# Patient Record
Sex: Female | Born: 1960 | Race: White | Hispanic: No | Marital: Married | Smoking: Never smoker
Health system: Southern US, Community
[De-identification: ages and names within clinical notes are randomized; demographics above are authoritative.]

## PROBLEM LIST (undated history)

## (undated) DIAGNOSIS — D500 Iron deficiency anemia secondary to blood loss (chronic): Secondary | ICD-10-CM

## (undated) DIAGNOSIS — R739 Hyperglycemia, unspecified: Secondary | ICD-10-CM

## (undated) DIAGNOSIS — L309 Dermatitis, unspecified: Secondary | ICD-10-CM

## (undated) DIAGNOSIS — R159 Full incontinence of feces: Secondary | ICD-10-CM

## (undated) DIAGNOSIS — Z79899 Other long term (current) drug therapy: Secondary | ICD-10-CM

## (undated) DIAGNOSIS — E781 Pure hyperglyceridemia: Secondary | ICD-10-CM

## (undated) DIAGNOSIS — N924 Excessive bleeding in the premenopausal period: Secondary | ICD-10-CM

## (undated) DIAGNOSIS — F259 Schizoaffective disorder, unspecified: Secondary | ICD-10-CM

## (undated) DIAGNOSIS — F250 Schizoaffective disorder, bipolar type: Secondary | ICD-10-CM

## (undated) DIAGNOSIS — K5909 Other constipation: Secondary | ICD-10-CM

## (undated) DIAGNOSIS — R438 Other disturbances of smell and taste: Secondary | ICD-10-CM

## (undated) HISTORY — PX: COLONOSCOPY: SHX174

## (undated) HISTORY — DX: Schizoaffective disorder, bipolar type: F25.0

## (undated) HISTORY — DX: Other long term (current) drug therapy: Z79.899

## (undated) HISTORY — DX: Iron deficiency anemia secondary to blood loss (chronic): D50.0

## (undated) HISTORY — DX: Hyperglycemia, unspecified: R73.9

## (undated) HISTORY — DX: Full incontinence of feces: R15.9

## (undated) HISTORY — DX: Excessive bleeding in the premenopausal period: N92.4

## (undated) HISTORY — DX: Pure hyperglyceridemia: E78.1

## (undated) HISTORY — DX: Other constipation: K59.09

## (undated) HISTORY — DX: Other disturbances of smell and taste: R43.8

## (undated) HISTORY — DX: Dermatitis, unspecified: L30.9

---

## 1994-12-02 HISTORY — PX: TUBAL LIGATION: SHX77

## 1998-12-02 HISTORY — PX: ANUS SURGERY: SHX302

## 2011-07-02 ENCOUNTER — Other Ambulatory Visit: Payer: Self-pay | Admitting: Psychiatry

## 2011-07-03 ENCOUNTER — Other Ambulatory Visit: Payer: Self-pay | Admitting: Psychiatry

## 2011-08-03 ENCOUNTER — Other Ambulatory Visit: Payer: Self-pay | Admitting: Psychiatry

## 2011-09-02 ENCOUNTER — Other Ambulatory Visit: Payer: Self-pay | Admitting: Psychiatry

## 2011-10-03 ENCOUNTER — Other Ambulatory Visit: Payer: Self-pay | Admitting: Psychiatry

## 2011-10-10 ENCOUNTER — Other Ambulatory Visit: Payer: Self-pay | Admitting: Family Medicine

## 2011-11-19 ENCOUNTER — Other Ambulatory Visit: Payer: Self-pay | Admitting: Psychiatry

## 2011-12-03 ENCOUNTER — Other Ambulatory Visit: Payer: Self-pay | Admitting: Psychiatry

## 2011-12-26 LAB — CBC WITH DIFFERENTIAL/PLATELET
Basophil #: 0 10*3/uL (ref 0.0–0.1)
Eosinophil #: 0.2 10*3/uL (ref 0.0–0.7)
Eosinophil %: 3 %
HCT: 35.9 % (ref 35.0–47.0)
HGB: 12.1 g/dL (ref 12.0–16.0)
Lymphocyte #: 2.5 10*3/uL (ref 1.0–3.6)
MCHC: 33.7 g/dL (ref 32.0–36.0)
Monocyte #: 0.4 10*3/uL (ref 0.0–0.7)
Monocyte %: 6.3 %
Neutrophil %: 52.7 %
Platelet: 249 10*3/uL (ref 150–440)
RDW: 13.2 % (ref 11.5–14.5)
WBC: 6.8 10*3/uL (ref 3.6–11.0)

## 2012-01-03 ENCOUNTER — Other Ambulatory Visit: Payer: Self-pay | Admitting: Psychiatry

## 2012-01-21 LAB — CBC WITH DIFFERENTIAL/PLATELET
Basophil #: 0 10*3/uL (ref 0.0–0.1)
Basophil %: 0.5 %
Eosinophil #: 0.1 10*3/uL (ref 0.0–0.7)
Eosinophil %: 2.1 %
Lymphocyte #: 1.9 10*3/uL (ref 1.0–3.6)
MCH: 30.1 pg (ref 26.0–34.0)
MCHC: 33 g/dL (ref 32.0–36.0)
MCV: 91 fL (ref 80–100)
Monocyte #: 0.3 10*3/uL (ref 0.0–0.7)
Neutrophil #: 3.3 10*3/uL (ref 1.4–6.5)
Platelet: 242 10*3/uL (ref 150–440)
RDW: 13.3 % (ref 11.5–14.5)

## 2012-01-31 ENCOUNTER — Other Ambulatory Visit: Payer: Self-pay | Admitting: Psychiatry

## 2012-02-18 LAB — CBC WITH DIFFERENTIAL/PLATELET
Basophil #: 0 10*3/uL (ref 0.0–0.1)
Eosinophil #: 0.2 10*3/uL (ref 0.0–0.7)
HCT: 36 % (ref 35.0–47.0)
HGB: 12.2 g/dL (ref 12.0–16.0)
Lymphocyte #: 2.2 10*3/uL (ref 1.0–3.6)
Lymphocyte %: 35 %
MCH: 30.8 pg (ref 26.0–34.0)
MCHC: 33.8 g/dL (ref 32.0–36.0)
MCV: 91 fL (ref 80–100)
Monocyte #: 0.4 10*3/uL (ref 0.0–0.7)
Monocyte %: 5.7 %
Neutrophil #: 3.5 10*3/uL (ref 1.4–6.5)
RDW: 13.5 % (ref 11.5–14.5)
WBC: 6.2 10*3/uL (ref 3.6–11.0)

## 2012-03-02 ENCOUNTER — Other Ambulatory Visit: Payer: Self-pay | Admitting: Psychiatry

## 2012-03-19 LAB — CBC WITH DIFFERENTIAL/PLATELET
Basophil #: 0.1 10*3/uL (ref 0.0–0.1)
Eosinophil %: 1.7 %
Lymphocyte #: 2.1 10*3/uL (ref 1.0–3.6)
Lymphocyte %: 30.9 %
MCHC: 32.3 g/dL (ref 32.0–36.0)
MCV: 92 fL (ref 80–100)
Monocyte %: 6.2 %
Neutrophil #: 4 10*3/uL (ref 1.4–6.5)
Neutrophil %: 60.4 %
Platelet: 246 10*3/uL (ref 150–440)
WBC: 6.7 10*3/uL (ref 3.6–11.0)

## 2012-04-01 ENCOUNTER — Other Ambulatory Visit: Payer: Self-pay | Admitting: Psychiatry

## 2012-04-28 LAB — CBC WITH DIFFERENTIAL/PLATELET
Basophil #: 0 10*3/uL (ref 0.0–0.1)
Basophil %: 0.5 %
Eosinophil #: 0.4 10*3/uL (ref 0.0–0.7)
Eosinophil %: 5.8 %
HCT: 37.3 % (ref 35.0–47.0)
HGB: 12.3 g/dL (ref 12.0–16.0)
Lymphocyte #: 2.5 10*3/uL (ref 1.0–3.6)
MCH: 30.9 pg (ref 26.0–34.0)
MCHC: 33.1 g/dL (ref 32.0–36.0)
Monocyte #: 0.4 x10 3/mm (ref 0.2–0.9)
Monocyte %: 5.1 %
Neutrophil #: 4.4 10*3/uL (ref 1.4–6.5)
Neutrophil %: 56.7 %
RBC: 4 10*6/uL (ref 3.80–5.20)

## 2012-05-02 ENCOUNTER — Other Ambulatory Visit: Payer: Self-pay | Admitting: Psychiatry

## 2012-05-26 LAB — CBC WITH DIFFERENTIAL/PLATELET
Basophil #: 0.1 10*3/uL (ref 0.0–0.1)
Basophil %: 0.7 %
Eosinophil #: 0.2 10*3/uL (ref 0.0–0.7)
Eosinophil %: 2.7 %
HCT: 38.5 % (ref 35.0–47.0)
Lymphocyte #: 2.2 10*3/uL (ref 1.0–3.6)
MCHC: 33.1 g/dL (ref 32.0–36.0)
MCV: 92 fL (ref 80–100)
Neutrophil #: 4.2 10*3/uL (ref 1.4–6.5)
Neutrophil %: 60.4 %
Platelet: 235 10*3/uL (ref 150–440)
RBC: 4.19 10*6/uL (ref 3.80–5.20)
WBC: 7 10*3/uL (ref 3.6–11.0)

## 2012-06-01 ENCOUNTER — Other Ambulatory Visit: Payer: Self-pay | Admitting: Psychiatry

## 2012-06-23 LAB — CBC WITH DIFFERENTIAL/PLATELET
Basophil #: 0 10*3/uL (ref 0.0–0.1)
Basophil %: 0.6 %
Eosinophil #: 0.1 10*3/uL (ref 0.0–0.7)
Eosinophil %: 1.8 %
HCT: 37.8 % (ref 35.0–47.0)
Lymphocyte #: 2.2 10*3/uL (ref 1.0–3.6)
Lymphocyte %: 30.9 %
MCH: 30.5 pg (ref 26.0–34.0)
MCHC: 32.9 g/dL (ref 32.0–36.0)
MCV: 93 fL (ref 80–100)
Monocyte #: 0.4 x10 3/mm (ref 0.2–0.9)
Neutrophil #: 4.4 10*3/uL (ref 1.4–6.5)
Neutrophil %: 61.1 %
RBC: 4.07 10*6/uL (ref 3.80–5.20)
RDW: 12.7 % (ref 11.5–14.5)
WBC: 7.2 10*3/uL (ref 3.6–11.0)

## 2012-07-02 ENCOUNTER — Other Ambulatory Visit: Payer: Self-pay | Admitting: Psychiatry

## 2012-07-16 LAB — CBC WITH DIFFERENTIAL/PLATELET
Basophil #: 0 10*3/uL (ref 0.0–0.1)
Basophil %: 0.6 %
Eosinophil #: 0.2 10*3/uL (ref 0.0–0.7)
Eosinophil %: 2.9 %
HCT: 36.5 % (ref 35.0–47.0)
Lymphocyte #: 1.8 10*3/uL (ref 1.0–3.6)
MCH: 30 pg (ref 26.0–34.0)
MCV: 92 fL (ref 80–100)
Monocyte %: 6.6 %
Neutrophil #: 3.9 10*3/uL (ref 1.4–6.5)
RBC: 4 10*6/uL (ref 3.80–5.20)
RDW: 12.9 % (ref 11.5–14.5)
WBC: 6.3 10*3/uL (ref 3.6–11.0)

## 2012-08-02 ENCOUNTER — Other Ambulatory Visit: Payer: Self-pay | Admitting: Psychiatry

## 2012-08-19 ENCOUNTER — Other Ambulatory Visit: Payer: Self-pay | Admitting: Psychiatry

## 2012-08-19 LAB — CBC WITH DIFFERENTIAL/PLATELET
Basophil %: 0.9 %
Eosinophil %: 2.2 %
HCT: 36.5 % (ref 35.0–47.0)
HGB: 12.4 g/dL (ref 12.0–16.0)
Lymphocyte #: 2.6 10*3/uL (ref 1.0–3.6)
MCH: 31.4 pg (ref 26.0–34.0)
MCV: 92 fL (ref 80–100)
Monocyte #: 0.5 x10 3/mm (ref 0.2–0.9)
Monocyte %: 6.6 %
Neutrophil %: 57 %
Platelet: 248 10*3/uL (ref 150–440)
RBC: 3.96 10*6/uL (ref 3.80–5.20)
WBC: 7.8 10*3/uL (ref 3.6–11.0)

## 2012-09-01 ENCOUNTER — Other Ambulatory Visit: Payer: Self-pay | Admitting: Psychiatry

## 2012-09-16 LAB — CBC WITH DIFFERENTIAL/PLATELET
Basophil %: 0.6 %
Eosinophil %: 1.9 %
HCT: 38.8 % (ref 35.0–47.0)
HGB: 12.9 g/dL (ref 12.0–16.0)
MCHC: 33.2 g/dL (ref 32.0–36.0)
MCV: 92 fL (ref 80–100)
Monocyte %: 5.1 %
Neutrophil %: 66.9 %
Platelet: 244 10*3/uL (ref 150–440)
RBC: 4.21 10*6/uL (ref 3.80–5.20)

## 2012-10-02 ENCOUNTER — Other Ambulatory Visit: Payer: Self-pay | Admitting: Psychiatry

## 2012-10-21 LAB — CBC WITH DIFFERENTIAL/PLATELET
Basophil #: 0 10*3/uL (ref 0.0–0.1)
Basophil %: 0.4 %
Eosinophil %: 1.9 %
HCT: 37.8 % (ref 35.0–47.0)
HGB: 12.6 g/dL (ref 12.0–16.0)
Lymphocyte #: 2.2 10*3/uL (ref 1.0–3.6)
MCH: 30.4 pg (ref 26.0–34.0)
MCV: 92 fL (ref 80–100)
Monocyte #: 0.5 x10 3/mm (ref 0.2–0.9)
Monocyte %: 6.1 %
Neutrophil #: 4.6 10*3/uL (ref 1.4–6.5)
Platelet: 237 10*3/uL (ref 150–440)
RBC: 4.13 10*6/uL (ref 3.80–5.20)

## 2012-11-01 ENCOUNTER — Other Ambulatory Visit: Payer: Self-pay | Admitting: Psychiatry

## 2012-11-17 LAB — CBC WITH DIFFERENTIAL/PLATELET
Basophil %: 1.2 %
Eosinophil #: 0.1 10*3/uL (ref 0.0–0.7)
HCT: 37.4 % (ref 35.0–47.0)
HGB: 12.4 g/dL (ref 12.0–16.0)
Lymphocyte #: 2.1 10*3/uL (ref 1.0–3.6)
Lymphocyte %: 30.4 %
MCH: 30.2 pg (ref 26.0–34.0)
MCHC: 33.1 g/dL (ref 32.0–36.0)
MCV: 91 fL (ref 80–100)
Monocyte #: 0.4 x10 3/mm (ref 0.2–0.9)
Neutrophil #: 4.1 10*3/uL (ref 1.4–6.5)
Platelet: 231 10*3/uL (ref 150–440)
RDW: 12.7 % (ref 11.5–14.5)
WBC: 6.8 10*3/uL (ref 3.6–11.0)

## 2012-12-02 ENCOUNTER — Other Ambulatory Visit: Payer: Self-pay | Admitting: Psychiatry

## 2012-12-16 LAB — CBC WITH DIFFERENTIAL/PLATELET
Basophil #: 0 10*3/uL (ref 0.0–0.1)
Basophil %: 0.4 %
Eosinophil #: 0.1 10*3/uL (ref 0.0–0.7)
HGB: 12.6 g/dL (ref 12.0–16.0)
Lymphocyte #: 1.5 10*3/uL (ref 1.0–3.6)
MCH: 30.3 pg (ref 26.0–34.0)
MCV: 91 fL (ref 80–100)
Neutrophil #: 5.1 10*3/uL (ref 1.4–6.5)
Platelet: 247 10*3/uL (ref 150–440)
RBC: 4.16 10*6/uL (ref 3.80–5.20)
RDW: 12.9 % (ref 11.5–14.5)

## 2013-01-02 ENCOUNTER — Other Ambulatory Visit: Payer: Self-pay | Admitting: Psychiatry

## 2013-01-18 LAB — CBC WITH DIFFERENTIAL/PLATELET
Basophil #: 0.1 x10 3/mm 3
Basophil %: 1 %
Eosinophil #: 0.1 x10 3/mm 3
Eosinophil %: 1.2 %
HCT: 38.3 %
HGB: 12.9 g/dL
Lymphocyte %: 21.4 %
Lymphs Abs: 2 x10 3/mm 3
MCH: 30.6 pg
MCHC: 33.7 g/dL
MCV: 91 fL
Monocyte #: 0.4 "x10 3/mm "
Monocyte %: 4.8 %
Neutrophil #: 6.6 x10 3/mm 3 — ABNORMAL HIGH
Neutrophil %: 71.6 %
Platelet: 242 x10 3/mm 3
RBC: 4.22 X10 6/mm 3
RDW: 12.9 %
WBC: 9.2 x10 3/mm 3

## 2013-01-30 ENCOUNTER — Other Ambulatory Visit: Payer: Self-pay | Admitting: Psychiatry

## 2013-02-11 LAB — CBC WITH DIFFERENTIAL/PLATELET
Eosinophil %: 0.8 %
HCT: 34.1 % — ABNORMAL LOW (ref 35.0–47.0)
HGB: 11.5 g/dL — ABNORMAL LOW (ref 12.0–16.0)
Lymphocyte #: 1.7 10*3/uL (ref 1.0–3.6)
Lymphocyte %: 18.1 %
MCH: 30.5 pg (ref 26.0–34.0)
Monocyte #: 0.4 x10 3/mm (ref 0.2–0.9)
Neutrophil #: 7.3 10*3/uL — ABNORMAL HIGH (ref 1.4–6.5)
Platelet: 397 10*3/uL (ref 150–440)
RBC: 3.77 10*6/uL — ABNORMAL LOW (ref 3.80–5.20)
RDW: 12.9 % (ref 11.5–14.5)
WBC: 9.6 10*3/uL (ref 3.6–11.0)

## 2013-03-02 ENCOUNTER — Other Ambulatory Visit: Payer: Self-pay | Admitting: Psychiatry

## 2013-03-04 ENCOUNTER — Inpatient Hospital Stay: Payer: Self-pay | Admitting: Psychiatry

## 2013-03-04 LAB — URINALYSIS, COMPLETE
Ketone: NEGATIVE
Nitrite: NEGATIVE
Ph: 6 (ref 4.5–8.0)
Protein: NEGATIVE
Specific Gravity: 1.013 (ref 1.003–1.030)
WBC UR: 15 /HPF (ref 0–5)

## 2013-03-04 LAB — DRUG SCREEN, URINE
Benzodiazepine, Ur Scrn: NEGATIVE (ref ?–200)
Cannabinoid 50 Ng, Ur ~~LOC~~: NEGATIVE (ref ?–50)
MDMA (Ecstasy)Ur Screen: NEGATIVE (ref ?–500)
Phencyclidine (PCP) Ur S: NEGATIVE (ref ?–25)
Tricyclic, Ur Screen: NEGATIVE (ref ?–1000)

## 2013-03-04 LAB — CBC
HCT: 38.7 % (ref 35.0–47.0)
HGB: 12.9 g/dL (ref 12.0–16.0)
MCHC: 33.3 g/dL (ref 32.0–36.0)
MCV: 92 fL (ref 80–100)
RDW: 13.9 % (ref 11.5–14.5)
WBC: 12.7 10*3/uL — ABNORMAL HIGH (ref 3.6–11.0)

## 2013-03-04 LAB — COMPREHENSIVE METABOLIC PANEL
BUN: 11 mg/dL (ref 7–18)
Bilirubin,Total: 0.2 mg/dL (ref 0.2–1.0)
Calcium, Total: 8.8 mg/dL (ref 8.5–10.1)
Chloride: 107 mmol/L (ref 98–107)
Co2: 26 mmol/L (ref 21–32)
Creatinine: 0.75 mg/dL (ref 0.60–1.30)
EGFR (Non-African Amer.): >60
Glucose: 105 mg/dL — ABNORMAL HIGH (ref 65–99)
Osmolality: 277 (ref 275–301)
Potassium: 4.2 mmol/L (ref 3.5–5.1)
Sodium: 139 mmol/L (ref 136–145)

## 2013-03-04 LAB — ETHANOL: Ethanol: <3 mg/dL

## 2013-03-05 LAB — BEHAVIORAL MEDICINE 1 PANEL
Albumin: 3.7 g/dL (ref 3.4–5.0)
Alkaline Phosphatase: 114 U/L (ref 50–136)
Anion Gap: 7 (ref 7–16)
BUN: 10 mg/dL (ref 7–18)
Basophil #: 0 10*3/uL (ref 0.0–0.1)
Basophil %: 0.2 %
Bilirubin,Total: 0.8 mg/dL (ref 0.2–1.0)
Calcium, Total: 8.8 mg/dL (ref 8.5–10.1)
Chloride: 107 mmol/L (ref 98–107)
Co2: 25 mmol/L (ref 21–32)
Creatinine: 0.67 mg/dL (ref 0.60–1.30)
EGFR (African American): >60
EGFR (Non-African Amer.): >60
Eosinophil #: 0.1 10*3/uL (ref 0.0–0.7)
Eosinophil %: 0.7 %
Glucose: 105 mg/dL — ABNORMAL HIGH (ref 65–99)
HCT: 37.8 % (ref 35.0–47.0)
HGB: 12.4 g/dL (ref 12.0–16.0)
Lymphocyte #: 1.9 10*3/uL (ref 1.0–3.6)
Lymphocyte %: 15.6 %
MCH: 30.1 pg (ref 26.0–34.0)
MCHC: 32.9 g/dL (ref 32.0–36.0)
MCV: 92 fL (ref 80–100)
Monocyte #: 0.6 x10 3/mm (ref 0.2–0.9)
Monocyte %: 5 %
Neutrophil #: 9.5 10*3/uL — ABNORMAL HIGH (ref 1.4–6.5)
Neutrophil %: 78.5 %
Osmolality: 277 (ref 275–301)
Platelet: 290 10*3/uL (ref 150–440)
Potassium: 4 mmol/L (ref 3.5–5.1)
RBC: 4.13 10*6/uL (ref 3.80–5.20)
RDW: 13.4 % (ref 11.5–14.5)
SGOT(AST): 17 U/L (ref 15–37)
SGPT (ALT): 20 U/L (ref 12–78)
Sodium: 139 mmol/L (ref 136–145)
Thyroid Stimulating Horm: 2.43 u[IU]/mL
Total Protein: 7.4 g/dL (ref 6.4–8.2)
WBC: 12.1 10*3/uL — ABNORMAL HIGH (ref 3.6–11.0)

## 2013-03-07 LAB — URINE CULTURE

## 2013-03-12 LAB — DIFFERENTIAL
Basophil #: 0 10*3/uL (ref 0.0–0.1)
Basophil %: 0.3 %
Eosinophil #: 0.3 10*3/uL (ref 0.0–0.7)
Eosinophil %: 3.9 %
Lymphocyte #: 2.2 10*3/uL (ref 1.0–3.6)
Lymphocyte %: 29.8 %
Monocyte #: 0.6 x10 3/mm (ref 0.2–0.9)
Monocyte %: 7.6 %
Neutrophil #: 4.3 10*3/uL (ref 1.4–6.5)
Neutrophil %: 58.4 %

## 2013-03-12 LAB — WBC: WBC: 7.4 10*3/uL (ref 3.6–11.0)

## 2013-03-16 LAB — VALPROIC ACID LEVEL: Valproic Acid: 77 ug/mL

## 2013-03-19 LAB — DIFFERENTIAL
Basophil #: 0 10*3/uL (ref 0.0–0.1)
Eosinophil #: 0.3 10*3/uL (ref 0.0–0.7)
Lymphocyte #: 2 10*3/uL (ref 1.0–3.6)
Neutrophil #: 6.8 10*3/uL — ABNORMAL HIGH (ref 1.4–6.5)
Neutrophil %: 69.6 %

## 2013-03-26 LAB — DIFFERENTIAL
Basophil #: 0.1 10*3/uL (ref 0.0–0.1)
Eosinophil #: 0.3 10*3/uL (ref 0.0–0.7)
Eosinophil %: 3.1 %
Lymphocyte #: 2.7 10*3/uL (ref 1.0–3.6)
Lymphocyte %: 31.8 %
Monocyte #: 0.7 x10 3/mm (ref 0.2–0.9)
Monocyte %: 8.5 %

## 2013-03-26 LAB — WBC: WBC: 8.7 10*3/uL (ref 3.6–11.0)

## 2013-03-31 ENCOUNTER — Other Ambulatory Visit: Payer: Self-pay | Admitting: Psychiatry

## 2013-03-31 LAB — CBC WITH DIFFERENTIAL/PLATELET
Basophil #: 0.1 10*3/uL (ref 0.0–0.1)
Eosinophil #: 0.1 10*3/uL (ref 0.0–0.7)
Lymphocyte %: 19.8 %
MCH: 29.5 pg (ref 26.0–34.0)
MCHC: 32.5 g/dL (ref 32.0–36.0)
MCV: 91 fL (ref 80–100)
Monocyte #: 0.5 x10 3/mm (ref 0.2–0.9)
Neutrophil #: 6.4 10*3/uL (ref 1.4–6.5)
RBC: 3.83 10*6/uL (ref 3.80–5.20)
WBC: 8.9 10*3/uL (ref 3.6–11.0)

## 2013-03-31 LAB — HEPATIC FUNCTION PANEL A (ARMC)
Bilirubin, Direct: <0.1 mg/dL (ref 0.00–0.20)
Bilirubin,Total: 0.2 mg/dL (ref 0.2–1.0)
SGPT (ALT): 19 U/L (ref 12–78)
Total Protein: 6.4 g/dL (ref 6.4–8.2)

## 2013-05-01 ENCOUNTER — Other Ambulatory Visit: Payer: Self-pay | Admitting: Psychiatry

## 2013-05-01 LAB — CBC WITH DIFFERENTIAL/PLATELET
Basophil #: 0 10*3/uL (ref 0.0–0.1)
Basophil %: 0.5 %
HCT: 38.3 % (ref 35.0–47.0)
Lymphocyte #: 1.8 10*3/uL (ref 1.0–3.6)
Lymphocyte %: 26.5 %
MCH: 30.4 pg (ref 26.0–34.0)
MCHC: 34.3 g/dL (ref 32.0–36.0)
Monocyte #: 0.6 x10 3/mm (ref 0.2–0.9)
Neutrophil #: 4.1 10*3/uL (ref 1.4–6.5)
Neutrophil %: 61.1 %
Platelet: 224 10*3/uL (ref 150–440)
RBC: 4.32 10*6/uL (ref 3.80–5.20)
WBC: 6.8 10*3/uL (ref 3.6–11.0)

## 2013-05-02 ENCOUNTER — Other Ambulatory Visit: Payer: Self-pay | Admitting: Psychiatry

## 2013-05-05 ENCOUNTER — Ambulatory Visit: Payer: Self-pay | Admitting: Gastroenterology

## 2013-05-25 LAB — CBC WITH DIFFERENTIAL/PLATELET
Eosinophil #: 0.2 10*3/uL (ref 0.0–0.7)
Eosinophil %: 3.2 %
HGB: 12.4 g/dL (ref 12.0–16.0)
Lymphocyte %: 31.5 %
MCH: 30.5 pg (ref 26.0–34.0)
MCHC: 34.4 g/dL (ref 32.0–36.0)
MCV: 89 fL (ref 80–100)
Monocyte %: 7.6 %
Platelet: 228 10*3/uL (ref 150–440)
RBC: 4.06 10*6/uL (ref 3.80–5.20)
WBC: 6.2 10*3/uL (ref 3.6–11.0)

## 2013-06-01 ENCOUNTER — Other Ambulatory Visit: Payer: Self-pay | Admitting: Psychiatry

## 2013-07-01 LAB — CBC WITH DIFFERENTIAL/PLATELET
Basophil #: 0 10*3/uL (ref 0.0–0.1)
Basophil %: 0.5 %
Eosinophil #: 0.2 10*3/uL (ref 0.0–0.7)
Eosinophil %: 2.7 %
HCT: 36.3 % (ref 35.0–47.0)
HGB: 12.3 g/dL (ref 12.0–16.0)
Lymphocyte %: 29.8 %
MCH: 30.6 pg (ref 26.0–34.0)
MCHC: 33.9 g/dL (ref 32.0–36.0)
Monocyte #: 0.6 x10 3/mm (ref 0.2–0.9)
Monocyte %: 6.8 %
Neutrophil %: 60.2 %
Platelet: 219 10*3/uL (ref 150–440)

## 2013-07-02 ENCOUNTER — Other Ambulatory Visit: Payer: Self-pay | Admitting: Psychiatry

## 2013-07-27 LAB — CBC WITH DIFFERENTIAL/PLATELET
Basophil #: 0.1 10*3/uL (ref 0.0–0.1)
Basophil %: 0.9 %
Eosinophil %: 4.1 %
HCT: 35.5 % (ref 35.0–47.0)
Lymphocyte #: 2.6 10*3/uL (ref 1.0–3.6)
Lymphocyte %: 37.3 %
MCH: 30.6 pg (ref 26.0–34.0)
Monocyte %: 7.1 %
Neutrophil %: 50.6 %
RDW: 13.5 % (ref 11.5–14.5)
WBC: 7 10*3/uL (ref 3.6–11.0)

## 2013-08-02 ENCOUNTER — Other Ambulatory Visit: Payer: Self-pay | Admitting: Psychiatry

## 2013-08-05 LAB — HM COLONOSCOPY: HM Colonoscopy: NORMAL

## 2013-08-27 ENCOUNTER — Other Ambulatory Visit: Payer: Self-pay | Admitting: Family Medicine

## 2013-08-27 LAB — COMPREHENSIVE METABOLIC PANEL
Alkaline Phosphatase: 107 U/L (ref 50–136)
Anion Gap: 3 — ABNORMAL LOW (ref 7–16)
BUN: 11 mg/dL (ref 7–18)
Bilirubin,Total: 0.3 mg/dL (ref 0.2–1.0)
Calcium, Total: 8.5 mg/dL (ref 8.5–10.1)
Chloride: 108 mmol/L — ABNORMAL HIGH (ref 98–107)
Co2: 28 mmol/L (ref 21–32)
Creatinine: 0.95 mg/dL (ref 0.60–1.30)
EGFR (African American): >60
EGFR (Non-African Amer.): >60
Glucose: 98 mg/dL (ref 65–99)
Osmolality: 277 (ref 275–301)
Potassium: 4.2 mmol/L (ref 3.5–5.1)
SGOT(AST): 20 U/L (ref 15–37)
SGPT (ALT): 19 U/L (ref 12–78)

## 2013-08-27 LAB — CBC WITH DIFFERENTIAL/PLATELET
Basophil #: 0.1 10*3/uL (ref 0.0–0.1)
Eosinophil #: 0.2 10*3/uL (ref 0.0–0.7)
Eosinophil %: 4.3 %
HCT: 37.5 % (ref 35.0–47.0)
Lymphocyte %: 35.6 %
Monocyte %: 6.1 %
Neutrophil #: 2.8 10*3/uL (ref 1.4–6.5)
Platelet: 257 10*3/uL (ref 150–440)

## 2013-08-27 LAB — LIPID PANEL
Cholesterol: 241 mg/dL — ABNORMAL HIGH (ref 0–200)
Ldl Cholesterol, Calc: 149 mg/dL — ABNORMAL HIGH (ref 0–100)
Triglycerides: 143 mg/dL (ref 0–200)

## 2013-08-30 ENCOUNTER — Ambulatory Visit: Payer: Self-pay | Admitting: Family Medicine

## 2013-08-30 LAB — HM MAMMOGRAPHY: HM MAMMO: NORMAL

## 2013-10-01 ENCOUNTER — Other Ambulatory Visit: Payer: Self-pay | Admitting: Psychiatry

## 2013-10-01 LAB — CBC WITH DIFFERENTIAL/PLATELET
Basophil #: 0.1 10*3/uL (ref 0.0–0.1)
Eosinophil #: 0.1 10*3/uL (ref 0.0–0.7)
HCT: 36.9 % (ref 35.0–47.0)
HGB: 12.6 g/dL (ref 12.0–16.0)
Lymphocyte #: 2 10*3/uL (ref 1.0–3.6)
MCHC: 34.2 g/dL (ref 32.0–36.0)
MCV: 91 fL (ref 80–100)
Monocyte #: 0.5 x10 3/mm (ref 0.2–0.9)
Monocyte %: 7.9 %
Neutrophil #: 3.8 10*3/uL (ref 1.4–6.5)
Neutrophil %: 59.1 %
RBC: 4.07 10*6/uL (ref 3.80–5.20)
WBC: 6.4 10*3/uL (ref 3.6–11.0)

## 2013-10-02 ENCOUNTER — Other Ambulatory Visit: Payer: Self-pay | Admitting: Psychiatry

## 2013-10-29 LAB — CBC WITH DIFFERENTIAL/PLATELET
Basophil #: 0.1 10*3/uL (ref 0.0–0.1)
Eosinophil %: 4.3 %
HCT: 36.1 % (ref 35.0–47.0)
HGB: 12.1 g/dL (ref 12.0–16.0)
Lymphocyte #: 2.1 10*3/uL (ref 1.0–3.6)
Lymphocyte %: 34.9 %
MCH: 30.8 pg (ref 26.0–34.0)
MCV: 91 fL (ref 80–100)
RBC: 3.94 10*6/uL (ref 3.80–5.20)
RDW: 12.9 % (ref 11.5–14.5)
WBC: 6 10*3/uL (ref 3.6–11.0)

## 2013-11-01 ENCOUNTER — Other Ambulatory Visit: Payer: Self-pay | Admitting: Psychiatry

## 2013-11-22 LAB — CBC WITH DIFFERENTIAL/PLATELET
Basophil #: 0 10*3/uL (ref 0.0–0.1)
Eosinophil #: 0.2 10*3/uL (ref 0.0–0.7)
HCT: 37.9 % (ref 35.0–47.0)
HGB: 12.4 g/dL (ref 12.0–16.0)
Lymphocyte #: 1.9 10*3/uL (ref 1.0–3.6)
MCH: 29.8 pg (ref 26.0–34.0)
MCHC: 32.8 g/dL (ref 32.0–36.0)
MCV: 91 fL (ref 80–100)
Monocyte #: 0.6 x10 3/mm (ref 0.2–0.9)
Neutrophil %: 66.5 %
RBC: 4.17 10*6/uL (ref 3.80–5.20)
RDW: 12.8 % (ref 11.5–14.5)

## 2013-12-02 ENCOUNTER — Other Ambulatory Visit: Payer: Self-pay | Admitting: Psychiatry

## 2013-12-31 LAB — CBC WITH DIFFERENTIAL/PLATELET
BASOS PCT: 1.2 %
Basophil #: 0.1 10*3/uL (ref 0.0–0.1)
Eosinophil #: 0.1 10*3/uL (ref 0.0–0.7)
Eosinophil %: 1.7 %
HCT: 36.5 % (ref 35.0–47.0)
HGB: 12.4 g/dL (ref 12.0–16.0)
LYMPHS ABS: 1.9 10*3/uL (ref 1.0–3.6)
Lymphocyte %: 30.5 %
MCH: 31.3 pg (ref 26.0–34.0)
MCHC: 33.9 g/dL (ref 32.0–36.0)
MCV: 92 fL (ref 80–100)
MONOS PCT: 7 %
Monocyte #: 0.4 x10 3/mm (ref 0.2–0.9)
NEUTROS ABS: 3.8 10*3/uL (ref 1.4–6.5)
Neutrophil %: 59.6 %
PLATELETS: 226 10*3/uL (ref 150–440)
RBC: 3.95 10*6/uL (ref 3.80–5.20)
RDW: 13.3 % (ref 11.5–14.5)
WBC: 6.3 10*3/uL (ref 3.6–11.0)

## 2014-01-02 ENCOUNTER — Other Ambulatory Visit: Payer: Self-pay | Admitting: Psychiatry

## 2014-01-28 LAB — CBC WITH DIFFERENTIAL/PLATELET
BASOS ABS: 0.1 10*3/uL (ref 0.0–0.1)
Basophil %: 0.7 %
EOS PCT: 1.9 %
Eosinophil #: 0.1 10*3/uL (ref 0.0–0.7)
HCT: 38.5 % (ref 35.0–47.0)
HGB: 12.5 g/dL (ref 12.0–16.0)
Lymphocyte #: 2.1 10*3/uL (ref 1.0–3.6)
Lymphocyte %: 31.4 %
MCH: 29.7 pg (ref 26.0–34.0)
MCHC: 32.3 g/dL (ref 32.0–36.0)
MCV: 92 fL (ref 80–100)
MONOS PCT: 6.8 %
Monocyte #: 0.5 x10 3/mm (ref 0.2–0.9)
NEUTROS ABS: 4 10*3/uL (ref 1.4–6.5)
NEUTROS PCT: 59.2 %
Platelet: 232 10*3/uL (ref 150–440)
RBC: 4.2 10*6/uL (ref 3.80–5.20)
RDW: 12.8 % (ref 11.5–14.5)
WBC: 6.8 10*3/uL (ref 3.6–11.0)

## 2014-01-30 ENCOUNTER — Other Ambulatory Visit: Payer: Self-pay | Admitting: Psychiatry

## 2014-02-25 LAB — CBC WITH DIFFERENTIAL/PLATELET
BASOS PCT: 0.5 %
Basophil #: 0 10*3/uL (ref 0.0–0.1)
EOS ABS: 0.1 10*3/uL (ref 0.0–0.7)
Eosinophil %: 1.8 %
HCT: 36.5 % (ref 35.0–47.0)
HGB: 12 g/dL (ref 12.0–16.0)
LYMPHS ABS: 1.8 10*3/uL (ref 1.0–3.6)
LYMPHS PCT: 23.3 %
MCH: 30.6 pg (ref 26.0–34.0)
MCHC: 32.9 g/dL (ref 32.0–36.0)
MCV: 93 fL (ref 80–100)
MONOS PCT: 6.9 %
Monocyte #: 0.5 x10 3/mm (ref 0.2–0.9)
Neutrophil #: 5.2 10*3/uL (ref 1.4–6.5)
Neutrophil %: 67.5 %
PLATELETS: 235 10*3/uL (ref 150–440)
RBC: 3.93 10*6/uL (ref 3.80–5.20)
RDW: 12.9 % (ref 11.5–14.5)
WBC: 7.7 10*3/uL (ref 3.6–11.0)

## 2014-03-02 ENCOUNTER — Other Ambulatory Visit: Payer: Self-pay | Admitting: Psychiatry

## 2014-03-17 LAB — CBC WITH DIFFERENTIAL/PLATELET
Basophil #: 0.1 10*3/uL (ref 0.0–0.1)
Basophil %: 0.8 %
Eosinophil #: 0.1 10*3/uL (ref 0.0–0.7)
Eosinophil %: 2 %
HCT: 38.1 % (ref 35.0–47.0)
HGB: 12.5 g/dL (ref 12.0–16.0)
LYMPHS PCT: 27.1 %
Lymphocyte #: 1.8 10*3/uL (ref 1.0–3.6)
MCH: 30.5 pg (ref 26.0–34.0)
MCHC: 32.8 g/dL (ref 32.0–36.0)
MCV: 93 fL (ref 80–100)
MONOS PCT: 6.3 %
Monocyte #: 0.4 x10 3/mm (ref 0.2–0.9)
NEUTROS ABS: 4.3 10*3/uL (ref 1.4–6.5)
Neutrophil %: 63.8 %
Platelet: 277 10*3/uL (ref 150–440)
RBC: 4.11 10*6/uL (ref 3.80–5.20)
RDW: 13.1 % (ref 11.5–14.5)
WBC: 6.7 10*3/uL (ref 3.6–11.0)

## 2014-04-01 ENCOUNTER — Other Ambulatory Visit: Payer: Self-pay | Admitting: Psychiatry

## 2014-04-21 LAB — CBC WITH DIFFERENTIAL/PLATELET
Basophil #: 0 10*3/uL (ref 0.0–0.1)
Basophil %: 0.6 %
EOS ABS: 0.1 10*3/uL (ref 0.0–0.7)
Eosinophil %: 1.8 %
HCT: 39.1 % (ref 35.0–47.0)
HGB: 12.9 g/dL (ref 12.0–16.0)
LYMPHS ABS: 2.1 10*3/uL (ref 1.0–3.6)
Lymphocyte %: 30.6 %
MCH: 30.8 pg (ref 26.0–34.0)
MCHC: 33 g/dL (ref 32.0–36.0)
MCV: 93 fL (ref 80–100)
Monocyte #: 0.5 x10 3/mm (ref 0.2–0.9)
Monocyte %: 6.6 %
NEUTROS ABS: 4.1 10*3/uL (ref 1.4–6.5)
NEUTROS PCT: 60.4 %
Platelet: 262 10*3/uL (ref 150–440)
RBC: 4.19 10*6/uL (ref 3.80–5.20)
RDW: 13 % (ref 11.5–14.5)
WBC: 6.9 10*3/uL (ref 3.6–11.0)

## 2014-05-02 ENCOUNTER — Other Ambulatory Visit: Payer: Self-pay | Admitting: Psychiatry

## 2014-05-17 LAB — CBC WITH DIFFERENTIAL/PLATELET
Basophil #: 0 10*3/uL (ref 0.0–0.1)
Basophil %: 0.4 %
EOS ABS: 0.1 10*3/uL (ref 0.0–0.7)
EOS PCT: 0.8 %
HCT: 37.7 % (ref 35.0–47.0)
HGB: 12.4 g/dL (ref 12.0–16.0)
LYMPHS ABS: 2.2 10*3/uL (ref 1.0–3.6)
Lymphocyte %: 17.2 %
MCH: 30.4 pg (ref 26.0–34.0)
MCHC: 32.8 g/dL (ref 32.0–36.0)
MCV: 93 fL (ref 80–100)
MONO ABS: 0.7 x10 3/mm (ref 0.2–0.9)
Monocyte %: 5.3 %
NEUTROS PCT: 76.3 %
Neutrophil #: 9.6 10*3/uL — ABNORMAL HIGH (ref 1.4–6.5)
Platelet: 262 10*3/uL (ref 150–440)
RBC: 4.07 10*6/uL (ref 3.80–5.20)
RDW: 13 % (ref 11.5–14.5)
WBC: 12.6 10*3/uL — ABNORMAL HIGH (ref 3.6–11.0)

## 2014-06-01 ENCOUNTER — Other Ambulatory Visit: Payer: Self-pay | Admitting: Psychiatry

## 2014-06-16 LAB — CBC WITH DIFFERENTIAL/PLATELET
BASOS ABS: 0 10*3/uL (ref 0.0–0.1)
Basophil %: 0.3 %
EOS PCT: 1.8 %
Eosinophil #: 0.2 10*3/uL (ref 0.0–0.7)
HCT: 37.7 % (ref 35.0–47.0)
HGB: 12.2 g/dL (ref 12.0–16.0)
Lymphocyte #: 1.8 10*3/uL (ref 1.0–3.6)
Lymphocyte %: 19.2 %
MCH: 30.1 pg (ref 26.0–34.0)
MCHC: 32.3 g/dL (ref 32.0–36.0)
MCV: 93 fL (ref 80–100)
MONOS PCT: 4.9 %
Monocyte #: 0.5 x10 3/mm (ref 0.2–0.9)
NEUTROS PCT: 73.8 %
Neutrophil #: 7.1 10*3/uL — ABNORMAL HIGH (ref 1.4–6.5)
Platelet: 246 10*3/uL (ref 150–440)
RBC: 4.05 10*6/uL (ref 3.80–5.20)
RDW: 12.9 % (ref 11.5–14.5)
WBC: 9.6 10*3/uL (ref 3.6–11.0)

## 2014-07-02 ENCOUNTER — Other Ambulatory Visit: Payer: Self-pay | Admitting: Psychiatry

## 2014-07-19 LAB — CBC WITH DIFFERENTIAL/PLATELET
Basophil #: 0.1 10*3/uL (ref 0.0–0.1)
Basophil %: 0.7 %
Eosinophil #: 0.1 10*3/uL (ref 0.0–0.7)
Eosinophil %: 1.1 %
HCT: 37.4 % (ref 35.0–47.0)
HGB: 12.4 g/dL (ref 12.0–16.0)
LYMPHS ABS: 1.9 10*3/uL (ref 1.0–3.6)
LYMPHS PCT: 17.8 %
MCH: 30.6 pg (ref 26.0–34.0)
MCHC: 33.1 g/dL (ref 32.0–36.0)
MCV: 92 fL (ref 80–100)
MONOS PCT: 6.3 %
Monocyte #: 0.7 x10 3/mm (ref 0.2–0.9)
Neutrophil #: 7.8 10*3/uL — ABNORMAL HIGH (ref 1.4–6.5)
Neutrophil %: 74.1 %
Platelet: 249 10*3/uL (ref 150–440)
RBC: 4.05 10*6/uL (ref 3.80–5.20)
RDW: 13.5 % (ref 11.5–14.5)
WBC: 10.5 10*3/uL (ref 3.6–11.0)

## 2014-08-02 ENCOUNTER — Other Ambulatory Visit: Payer: Self-pay | Admitting: Psychiatry

## 2014-08-16 LAB — CBC WITH DIFFERENTIAL/PLATELET
BASOS PCT: 0.9 %
Basophil #: 0.1 10*3/uL (ref 0.0–0.1)
Eosinophil #: 0.1 10*3/uL (ref 0.0–0.7)
Eosinophil %: 2 %
HCT: 38 % (ref 35.0–47.0)
HGB: 12 g/dL (ref 12.0–16.0)
LYMPHS ABS: 1.9 10*3/uL (ref 1.0–3.6)
Lymphocyte %: 30.6 %
MCH: 29.3 pg (ref 26.0–34.0)
MCHC: 31.6 g/dL — AB (ref 32.0–36.0)
MCV: 93 fL (ref 80–100)
Monocyte #: 0.4 x10 3/mm (ref 0.2–0.9)
Monocyte %: 5.8 %
NEUTROS ABS: 3.8 10*3/uL (ref 1.4–6.5)
Neutrophil %: 60.7 %
Platelet: 249 10*3/uL (ref 150–440)
RBC: 4.09 10*6/uL (ref 3.80–5.20)
RDW: 13.3 % (ref 11.5–14.5)
WBC: 6.2 10*3/uL (ref 3.6–11.0)

## 2014-09-01 ENCOUNTER — Encounter: Payer: Self-pay | Admitting: Psychiatry

## 2014-09-13 LAB — CBC WITH DIFFERENTIAL/PLATELET
Basophil #: 0 10*3/uL (ref 0.0–0.1)
Basophil %: 0.9 %
EOS PCT: 1.8 %
Eosinophil #: 0.1 10*3/uL (ref 0.0–0.7)
HCT: 36.8 % (ref 35.0–47.0)
HGB: 11.8 g/dL — AB (ref 12.0–16.0)
Lymphocyte #: 1.6 10*3/uL (ref 1.0–3.6)
Lymphocyte %: 30.3 %
MCH: 29.8 pg (ref 26.0–34.0)
MCHC: 32 g/dL (ref 32.0–36.0)
MCV: 93 fL (ref 80–100)
Monocyte #: 0.3 x10 3/mm (ref 0.2–0.9)
Monocyte %: 5.2 %
NEUTROS ABS: 3.2 10*3/uL (ref 1.4–6.5)
NEUTROS PCT: 61.8 %
PLATELETS: 246 10*3/uL (ref 150–440)
RBC: 3.95 10*6/uL (ref 3.80–5.20)
RDW: 13.4 % (ref 11.5–14.5)
WBC: 5.2 10*3/uL (ref 3.6–11.0)

## 2014-10-02 ENCOUNTER — Encounter: Payer: Self-pay | Admitting: Psychiatry

## 2014-10-19 LAB — CBC WITH DIFFERENTIAL/PLATELET
BASOS ABS: 0 10*3/uL (ref 0.0–0.1)
Basophil %: 0.5 %
Eosinophil #: 0.1 10*3/uL (ref 0.0–0.7)
Eosinophil %: 1.6 %
HCT: 39.3 % (ref 35.0–47.0)
HGB: 12.8 g/dL (ref 12.0–16.0)
LYMPHS PCT: 29.2 %
Lymphocyte #: 2 10*3/uL (ref 1.0–3.6)
MCH: 30.5 pg (ref 26.0–34.0)
MCHC: 32.6 g/dL (ref 32.0–36.0)
MCV: 93 fL (ref 80–100)
MONO ABS: 0.5 x10 3/mm (ref 0.2–0.9)
MONOS PCT: 7.3 %
Neutrophil #: 4.2 10*3/uL (ref 1.4–6.5)
Neutrophil %: 61.4 %
Platelet: 260 10*3/uL (ref 150–440)
RBC: 4.21 10*6/uL (ref 3.80–5.20)
RDW: 12.9 % (ref 11.5–14.5)
WBC: 6.8 10*3/uL (ref 3.6–11.0)

## 2014-10-21 LAB — HEMOGLOBIN A1C: HEMOGLOBIN A1C: 5.5

## 2014-10-21 LAB — LIPID PANEL
Cholesterol: 260 mg/dL — AB (ref 0–200)
HDL: 66 mg/dL (ref 35–70)
LDL Cholesterol: 164 mg/dL
Triglycerides: 150 mg/dL (ref 40–160)

## 2014-11-01 ENCOUNTER — Encounter: Payer: Self-pay | Admitting: Psychiatry

## 2014-11-14 LAB — CBC WITH DIFFERENTIAL/PLATELET
Basophil #: 0 10*3/uL (ref 0.0–0.1)
Basophil %: 0.4 %
EOS PCT: 2.2 %
Eosinophil #: 0.1 10*3/uL (ref 0.0–0.7)
HCT: 40.2 % (ref 35.0–47.0)
HGB: 12.8 g/dL (ref 12.0–16.0)
LYMPHS ABS: 1.9 10*3/uL (ref 1.0–3.6)
LYMPHS PCT: 29.4 %
MCH: 29.7 pg (ref 26.0–34.0)
MCHC: 31.8 g/dL — AB (ref 32.0–36.0)
MCV: 93 fL (ref 80–100)
MONOS PCT: 6.1 %
Monocyte #: 0.4 x10 3/mm (ref 0.2–0.9)
Neutrophil #: 4.1 10*3/uL (ref 1.4–6.5)
Neutrophil %: 61.9 %
Platelet: 262 10*3/uL (ref 150–440)
RBC: 4.3 10*6/uL (ref 3.80–5.20)
RDW: 13 % (ref 11.5–14.5)
WBC: 6.6 10*3/uL (ref 3.6–11.0)

## 2014-12-02 ENCOUNTER — Encounter: Payer: Self-pay | Admitting: Psychiatry

## 2014-12-22 LAB — CBC WITH DIFFERENTIAL/PLATELET
BASOS PCT: 0.6 %
Basophil #: 0 10*3/uL (ref 0.0–0.1)
EOS PCT: 1.3 %
Eosinophil #: 0.1 10*3/uL (ref 0.0–0.7)
HCT: 37.1 % (ref 35.0–47.0)
HGB: 11.9 g/dL — AB (ref 12.0–16.0)
Lymphocyte #: 1.7 10*3/uL (ref 1.0–3.6)
Lymphocyte %: 21.7 %
MCH: 29.9 pg (ref 26.0–34.0)
MCHC: 32.1 g/dL (ref 32.0–36.0)
MCV: 93 fL (ref 80–100)
MONO ABS: 0.4 x10 3/mm (ref 0.2–0.9)
Monocyte %: 5.5 %
NEUTROS PCT: 70.9 %
Neutrophil #: 5.5 10*3/uL (ref 1.4–6.5)
Platelet: 256 10*3/uL (ref 150–440)
RBC: 3.99 10*6/uL (ref 3.80–5.20)
RDW: 13.1 % (ref 11.5–14.5)
WBC: 7.8 10*3/uL (ref 3.6–11.0)

## 2015-01-02 ENCOUNTER — Other Ambulatory Visit: Payer: Self-pay | Admitting: Psychiatry

## 2015-01-17 LAB — HM PAP SMEAR
HM PAP: NEGATIVE
HM Pap smear: NORMAL

## 2015-01-31 ENCOUNTER — Other Ambulatory Visit
Admit: 2015-01-31 | Disposition: A | Discharge status: Home / Self Care | Payer: Self-pay | Attending: Psychiatry | Admitting: Psychiatry

## 2015-02-23 LAB — CBC WITH DIFFERENTIAL/PLATELET
Basophil #: 0.1 10*3/uL (ref 0.0–0.1)
Basophil %: 0.9 %
Eosinophil #: 0.2 10*3/uL (ref 0.0–0.7)
Eosinophil %: 2.8 %
HCT: 38.1 % (ref 35.0–47.0)
HGB: 12.1 g/dL (ref 12.0–16.0)
Lymphocyte #: 1.8 10*3/uL (ref 1.0–3.6)
Lymphocyte %: 29 %
MCH: 29.4 pg (ref 26.0–34.0)
MCHC: 31.8 g/dL — ABNORMAL LOW (ref 32.0–36.0)
MCV: 93 fL (ref 80–100)
MONO ABS: 0.4 x10 3/mm (ref 0.2–0.9)
Monocyte %: 7 %
NEUTROS PCT: 60.3 %
Neutrophil #: 3.7 10*3/uL (ref 1.4–6.5)
Platelet: 226 10*3/uL (ref 150–440)
RBC: 4.12 10*6/uL (ref 3.80–5.20)
RDW: 12.8 % (ref 11.5–14.5)
WBC: 6.1 10*3/uL (ref 3.6–11.0)

## 2015-03-03 ENCOUNTER — Other Ambulatory Visit
Admit: 2015-03-03 | Disposition: A | Discharge status: Home / Self Care | Payer: Self-pay | Attending: Psychiatry | Admitting: Psychiatry

## 2015-03-24 LAB — CBC WITH DIFFERENTIAL/PLATELET
Basophil #: 0 10*3/uL (ref 0.0–0.1)
Basophil %: 0.4 %
EOS PCT: 4.8 %
Eosinophil #: 0.4 10*3/uL (ref 0.0–0.7)
HCT: 36.6 % (ref 35.0–47.0)
HGB: 11.9 g/dL — ABNORMAL LOW (ref 12.0–16.0)
LYMPHS ABS: 2.2 10*3/uL (ref 1.0–3.6)
LYMPHS PCT: 30.6 %
MCH: 29.9 pg (ref 26.0–34.0)
MCHC: 32.5 g/dL (ref 32.0–36.0)
MCV: 92 fL (ref 80–100)
MONOS PCT: 6.1 %
Monocyte #: 0.4 x10 3/mm (ref 0.2–0.9)
Neutrophil #: 4.2 10*3/uL (ref 1.4–6.5)
Neutrophil %: 58.1 %
PLATELETS: 245 10*3/uL (ref 150–440)
RBC: 3.97 10*6/uL (ref 3.80–5.20)
RDW: 13.1 % (ref 11.5–14.5)
WBC: 7.3 10*3/uL (ref 3.6–11.0)
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
## 2015-03-24 NOTE — Consult Note (Signed)
PATIENT NAME:  Carla Thompson, GAUTNEY MR#:  [id number] DATE OF BIRTH:  01-31-1961  DATE OF CONSULTATION:  03/23/2013  REFERRING PHYSICIAN:  Dr. Weber Cooks  CONSULTING PHYSICIAN:  Corky Sox. Nicholus Chandran, PA-C  REASON FOR CONSULTATION: Constipation due to clozapine.   HISTORY OF PRESENT ILLNESS: This is a pleasant 54 year old female who has been inpatient in behavioral health for the past several weeks, as she was admitted on 03/04/2013 with a history of schizoaffective disorder and was having worsening mental difficulties. She has been well managed on clozapine for quite some time; however, this is causing significant constipation. She states that she is only able to go once every 3 to 4 days and it is associated with severe straining and abdominal discomfort. She states that often times she have to sit on the toilet for a very long time in order to produce stool. This has been going on for long enough now that it is starting to irritate her bottom and is starting to cause bright red blood with her bowel movements. She also states that she has been having some difficulty sleeping as well. She has been trying to increase her hydration status, but is afraid to drink too much water, as it will make her get up in the middle of the night to urinate. Since admission, she was initiated on both MiraLAX as well as docusate sodium, which per the patient, this has not been providing much relief. She did recently moved here from [place] in 2012 and does state that she has had a colonoscopy when she was still living in the [place]. She suspects it has been at least 2 to 3 years since her last colonoscopy, but believes that it was found to be normal at that time. There is no known family history of colon polyps, but she does suspect that her grandmother may have had colon cancer, but she is unsure of age of diagnosis and unsure if it was actually colon cancer or not. There have been no unintentional weight changes. She denies any melena.  There is abdominal pain if she has gone a few days without a bowel movement. There is no nausea or vomiting. No chest pain or shortness of breath.   ALLERGIES: LEXAPRO.   PAST MEDICAL HISTORY: Schizoaffective disorder and chronic constipation.   HOME MEDICATIONS: Clozaril and Klonopin.   SOCIAL HISTORY: The patient moved to [place] in 2012 from [place]. The patient states that she is few daughters who she stays in good contact with.   FAMILY HISTORY: Grandpa with prostate cancer. Grandma with possible colon cancer; however, the patient is unsure of the age of diagnosis.   PAST SURGICAL HISTORY: Anal sphincter repair and tubal ligation.   REVIEW OF SYSTEMS: The patient reports getting frequent night sweats and difficulty sleeping. A 10-system review of systems was obtained. All other pertinent positives are mentioned above and otherwise negative.   PHYSICAL EXAMINATION: VITAL SIGNS: Blood pressure 139/69, heart rate 119, respirations 20, temperature 98.7.  GENERAL: This is a pleasant 54 year old female resting quietly and comfortably in behavioral health unit in no acute distress. Alert and oriented x 3.  HEAD: Atraumatic, normocephalic.  NECK: Supple. No lymphadenopathy noted.  HEENT: Sclerae anicteric. Mucous membranes moist.  LUNGS: Respirations are even and unlabored.  HEART: Sinus tachycardia. S1 and S2 noted.  ABDOMEN: Soft, nontender, nondistended. Normoactive bowel sounds in all 4 quadrants.   RECTAL: Deferred.  EXTREMITIES: Negative for lower extremity edema, 2+ pulses noted bilaterally.   PSYCHIATRIC: Appropriate mood and affect.  The patient seems to have good judgment.   LABORATORY DATA: White blood cells 9.7, hemoglobin 12.4, hematocrit 37.8, platelets 290. Sodium 139, potassium 4, BUN 10, glucose 105. TSH is within normal limits.   ASSESSMENT: 1.  Constipation, likely medication induced. Specifically clozapine.    2.  Urinary tract infection.  3.   Schizoaffective disorder, as the patient has been inpatient in behavioral health since 03/04/2013.   PLAN: At this time, we do recommend a high fiber diet as well as getting adequate hydration. We do recommend switching the patient to Senokot with docusate sodium combination to be taking three times a day. We do also recommend administering her a bottle of magnesium citrate to be given tonight. We did discuss the importance of trying to avoid straining when having a bowel movement and letting it happen naturally. Increasing physical activity and hydration can also go a long way. TSH was reviewed and was within normal limits as well. We will follow along with this patient closely and see how she responds to the above recommendations. All questions were answered.   Thank you so much for this consultation and for allowing [place] to participate in the patient's plan of care.   The above was discussed and agreed upon under supervisory agreement between myself and Dr. Gaylyn Cheers.    ____________________________ Corky Sox. Keali Mccraw, PA-C kme:cc D: 03/23/2013 17:10:19 ET T: 03/23/2013 18:13:39 ET JOB#: [id number]  cc: Corky Sox. Anslie Spadafora, PA-C, <Dictator> Julian PA ELECTRONICALLY SIGNED 03/24/2013 14:57

## 2015-03-24 NOTE — Consult Note (Signed)
Pt with medication induced constipation,  Will try dulcolax one at night and see how she responds.  Very much a trial and adjust process.  Pt concerned that when she gets the urge there is not much time to get to toilet.  I explained that we can't fine tune when the medicine will work but will try to have her take bowel medicine at bedtime so will maybe be more predictable.  Electronic Signatures: Manya Silvas (MD)  (Signed on 23-Apr-14 18:17)  Authored  Last Updated: 23-Apr-14 18:17 by Manya Silvas (MD)
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
## 2015-03-24 NOTE — Discharge Summary (Signed)
PATIENT NAME:  Carla Thompson, Carla Thompson MR#:  [id number] DATE OF BIRTH:  1961-11-05  DATE OF ADMISSION:  03/04/2013 DATE OF DISCHARGE:  03/26/2013  HOSPITAL COURSE: See dictated history and physical for details of admission. This 54 year old woman with a history of schizophrenia was admitted to the hospital with delusions of being dead, somatic delusions, depressed mood, disorganized thinking. This was a decompensation apparently similar to ones that she has had in the past. The patient has been treated with clozapine chronically. In the hospital, she was treated with clozapine. A blood level was eventually obtained and her dose appears to be correct. She has also been treated with continued doses of clonazepam 1 mg 3 times a day, Depakote 1000 mg at night for her mood symptoms. She was on mirtazapine for a while, but that may have been causing extra sedation and extra constipation and was discontinued. The patient gradually showed resolution of her psychotic symptoms. She became more appropriately interactive. She became less hyper-religiously focused. Her mood improved. She does not report suicidal ideation. She does not engage in dangerous behaviors. She continued to have multiple somatic complaints, although they became more realistic. A problem throughout her hospital stay was constipation and bowel difficulties. The patient is chronically constipated, probably from her clozapine, and does not appear to be able to deal with it very well. She had episodes of soiling herself and of wetting herself. These seem to have largely improved at this point. She was treated with multiple medications for this and appears to have finally gotten on something like a regular bowel schedule. GI consult was obtained with no other major intervention. At the time of discharge, the patient is to follow up in the community at [place] and with [facility]. She is agreeable to the discharge plan.   MEDICATIONS AT DISCHARGE: Dulcolax 10 mg at  bedtime p.r.n. for constipation, Klonopin 1 mg 3 times a day, Clozaril 300 mg at bedtime, Depakote 1000 mg at bedtime, docusate 200 mg twice a day, gabapentin 300 mg at night, hydrocortisone cream to the feet twice a day, MiraLax 17 grams per day.   LABORATORY RESULTS: CBC and neutrophil count all perfectly normal throughout her hospital stay. Labs on admission included a CBC normal except for slightly high white count at 12.7. Chemistry panel unremarkable, only slightly elevated glucose at 105. Alcohol not detected. TSH normal. Urinalysis positive for urinary tract infection, which was treated. Drug screen negative. Clozapine level done on April 4 was a total of 1284 with the addition of clozapine and norclozapine.   MENTAL STATUS EXAM AT DISCHARGE: Neatly dressed and groomed woman, looks her stated age, cooperative with the interview. Eye contact normal. Psychomotor activity remains somewhat sluggish. Speech is slow and decreased in total amount. Affect is flat. Mood is stated as being okay but appears somewhat anxious. Thoughts are generally lucid. No evidence of loosening of associations. No delusional content. Denies auditory or visual hallucinations. Denies suicidal or homicidal ideation. Judgment and insight improved. Intelligence baseline normal. Short and long-term memory grossly intact.   DISPOSITION: Discharge home with her family. Follow up with [facility] and [facility].   DIAGNOSIS, PRINCIPAL AND PRIMARY:  AXIS I: Schizophrenia, paranoid type.   SECONDARY DIAGNOSES:   AXIS I: Deferred.  AXIS II: No diagnosis.  AXIS III: Chronic medication-induced constipation, hypothyroidism, eczema on her feet. AXIS IV: Severe from chronic burden of illness.  AXIS V: Functioning at time of discharge: 37.    ____________________________ Gonzella Lex, MD jtc:jm D: 03/26/2013 14:36:56 ET  T: 03/26/2013 21:26:15 ET JOB#: [id number]  cc: Gonzella Lex, MD, <Dictator> Gonzella Lex MD ELECTRONICALLY  SIGNED 03/29/2013 22:59

## 2015-04-24 ENCOUNTER — Encounter
Admission: RE | Admit: 2015-04-24 | Discharge: 2015-04-24 | Disposition: A | Discharge status: Home / Self Care | Payer: Managed Care, Other (non HMO) | Source: Ambulatory Visit | Attending: Psychiatry | Admitting: Psychiatry

## 2015-04-24 DIAGNOSIS — Z79899 Other long term (current) drug therapy: Secondary | ICD-10-CM | POA: Insufficient documentation

## 2015-04-24 DIAGNOSIS — F259 Schizoaffective disorder, unspecified: Secondary | ICD-10-CM | POA: Insufficient documentation

## 2015-04-24 LAB — CBC WITH DIFFERENTIAL/PLATELET
BASOS ABS: 0.1 10*3/uL (ref 0–0.1)
Basophils Relative: 1 %
Eosinophils Absolute: 0.2 10*3/uL (ref 0–0.7)
Eosinophils Relative: 4 %
HCT: 38.1 % (ref 35.0–47.0)
HEMOGLOBIN: 12.5 g/dL (ref 12.0–16.0)
Lymphocytes Relative: 28 %
Lymphs Abs: 1.8 10*3/uL (ref 1.0–3.6)
MCH: 30 pg (ref 26.0–34.0)
MCHC: 32.8 g/dL (ref 32.0–36.0)
MCV: 91.5 fL (ref 80.0–100.0)
Monocytes Absolute: 0.5 10*3/uL (ref 0.2–0.9)
Monocytes Relative: 7 %
NEUTROS ABS: 4 10*3/uL (ref 1.4–6.5)
Neutrophils Relative %: 60 %
Platelets: 229 10*3/uL (ref 150–440)
RBC: 4.17 MIL/uL (ref 3.80–5.20)
RDW: 13.2 % (ref 11.5–14.5)
WBC: 6.6 10*3/uL (ref 3.6–11.0)

## 2015-05-26 ENCOUNTER — Other Ambulatory Visit
Admission: RE | Admit: 2015-05-26 | Discharge: 2015-05-26 | Disposition: A | Discharge status: Home / Self Care | Payer: Managed Care, Other (non HMO) | Source: Ambulatory Visit | Attending: Psychiatry | Admitting: Psychiatry

## 2015-05-26 DIAGNOSIS — Z79899 Other long term (current) drug therapy: Secondary | ICD-10-CM | POA: Insufficient documentation

## 2015-05-26 DIAGNOSIS — F259 Schizoaffective disorder, unspecified: Secondary | ICD-10-CM | POA: Diagnosis present

## 2015-05-26 LAB — CBC WITH DIFFERENTIAL/PLATELET
Basophils Absolute: 0 10*3/uL (ref 0–0.1)
Basophils Relative: 1 %
EOS ABS: 0.4 10*3/uL (ref 0–0.7)
EOS PCT: 7 %
HEMATOCRIT: 38 % (ref 35.0–47.0)
Hemoglobin: 12.6 g/dL (ref 12.0–16.0)
Lymphocytes Relative: 30 %
Lymphs Abs: 1.8 10*3/uL (ref 1.0–3.6)
MCH: 30.4 pg (ref 26.0–34.0)
MCHC: 33.2 g/dL (ref 32.0–36.0)
MCV: 91.5 fL (ref 80.0–100.0)
Monocytes Absolute: 0.4 10*3/uL (ref 0.2–0.9)
Monocytes Relative: 6 %
Neutro Abs: 3.4 10*3/uL (ref 1.4–6.5)
Neutrophils Relative %: 56 %
Platelets: 228 10*3/uL (ref 150–440)
RBC: 4.15 MIL/uL (ref 3.80–5.20)
RDW: 13.2 % (ref 11.5–14.5)
WBC: 6 10*3/uL (ref 3.6–11.0)

## 2015-06-13 ENCOUNTER — Other Ambulatory Visit: Payer: Self-pay | Admitting: Family Medicine

## 2015-06-13 DIAGNOSIS — Z1231 Encounter for screening mammogram for malignant neoplasm of breast: Secondary | ICD-10-CM

## 2015-06-14 ENCOUNTER — Ambulatory Visit
Admission: RE | Admit: 2015-06-14 | Discharge: 2015-06-14 | Disposition: A | Discharge status: Home / Self Care | Payer: Managed Care, Other (non HMO) | Source: Ambulatory Visit | Attending: Family Medicine | Admitting: Family Medicine

## 2015-06-14 DIAGNOSIS — Z1231 Encounter for screening mammogram for malignant neoplasm of breast: Secondary | ICD-10-CM | POA: Diagnosis present

## 2015-06-30 ENCOUNTER — Encounter
Admission: RE | Admit: 2015-06-30 | Discharge: 2015-06-30 | Disposition: A | Discharge status: Home / Self Care | Payer: Managed Care, Other (non HMO) | Source: Ambulatory Visit | Attending: Psychiatry | Admitting: Psychiatry

## 2015-06-30 DIAGNOSIS — Z79899 Other long term (current) drug therapy: Secondary | ICD-10-CM | POA: Insufficient documentation

## 2015-06-30 DIAGNOSIS — Z5181 Encounter for therapeutic drug level monitoring: Secondary | ICD-10-CM | POA: Diagnosis not present

## 2015-06-30 LAB — CBC WITH DIFFERENTIAL/PLATELET
Basophils Absolute: 0.1 10*3/uL (ref 0–0.1)
Basophils Relative: 1 %
EOS PCT: 3 %
Eosinophils Absolute: 0.2 10*3/uL (ref 0–0.7)
HCT: 37.3 % (ref 35.0–47.0)
HEMOGLOBIN: 12.3 g/dL (ref 12.0–16.0)
LYMPHS ABS: 2.2 10*3/uL (ref 1.0–3.6)
LYMPHS PCT: 32 %
MCH: 30 pg (ref 26.0–34.0)
MCHC: 32.9 g/dL (ref 32.0–36.0)
MCV: 91.4 fL (ref 80.0–100.0)
Monocytes Absolute: 0.5 10*3/uL (ref 0.2–0.9)
Monocytes Relative: 7 %
NEUTROS ABS: 3.9 10*3/uL (ref 1.4–6.5)
Neutrophils Relative %: 57 %
PLATELETS: 241 10*3/uL (ref 150–440)
RBC: 4.08 MIL/uL (ref 3.80–5.20)
RDW: 12.7 % (ref 11.5–14.5)
WBC: 6.8 10*3/uL (ref 3.6–11.0)

## 2015-07-26 ENCOUNTER — Other Ambulatory Visit
Admission: RE | Admit: 2015-07-26 | Discharge: 2015-07-26 | Disposition: A | Discharge status: Home / Self Care | Payer: Managed Care, Other (non HMO) | Source: Ambulatory Visit | Attending: Psychiatry | Admitting: Psychiatry

## 2015-07-26 DIAGNOSIS — Z79899 Other long term (current) drug therapy: Secondary | ICD-10-CM | POA: Diagnosis not present

## 2015-07-26 DIAGNOSIS — F259 Schizoaffective disorder, unspecified: Secondary | ICD-10-CM | POA: Diagnosis present

## 2015-07-26 LAB — CBC WITH DIFFERENTIAL/PLATELET
Basophils Absolute: 0 10*3/uL (ref 0–0.1)
Basophils Relative: 1 %
EOS ABS: 0.2 10*3/uL (ref 0–0.7)
Eosinophils Relative: 2 %
HCT: 40.2 % (ref 35.0–47.0)
HEMOGLOBIN: 13.1 g/dL (ref 12.0–16.0)
LYMPHS ABS: 2.1 10*3/uL (ref 1.0–3.6)
LYMPHS PCT: 26 %
MCH: 30.1 pg (ref 26.0–34.0)
MCHC: 32.6 g/dL (ref 32.0–36.0)
MCV: 92.4 fL (ref 80.0–100.0)
MONOS PCT: 8 %
Monocytes Absolute: 0.6 10*3/uL (ref 0.2–0.9)
NEUTROS PCT: 63 %
Neutro Abs: 5 10*3/uL (ref 1.4–6.5)
Platelets: 252 10*3/uL (ref 150–440)
RBC: 4.35 MIL/uL (ref 3.80–5.20)
RDW: 12.8 % (ref 11.5–14.5)
WBC: 7.9 10*3/uL (ref 3.6–11.0)

## 2015-08-28 ENCOUNTER — Encounter
Admission: RE | Admit: 2015-08-28 | Discharge: 2015-08-28 | Disposition: A | Discharge status: Home / Self Care | Payer: Managed Care, Other (non HMO) | Source: Ambulatory Visit | Attending: Psychiatry | Admitting: Psychiatry

## 2015-08-28 DIAGNOSIS — F259 Schizoaffective disorder, unspecified: Secondary | ICD-10-CM | POA: Insufficient documentation

## 2015-08-28 DIAGNOSIS — Z79899 Other long term (current) drug therapy: Secondary | ICD-10-CM | POA: Diagnosis not present

## 2015-08-28 LAB — CBC WITH DIFFERENTIAL/PLATELET
Basophils Absolute: 0 10*3/uL (ref 0–0.1)
Basophils Relative: 1 %
EOS ABS: 0.2 10*3/uL (ref 0–0.7)
EOS PCT: 4 %
HCT: 37.8 % (ref 35.0–47.0)
Hemoglobin: 12.4 g/dL (ref 12.0–16.0)
Lymphocytes Relative: 31 %
Lymphs Abs: 1.9 10*3/uL (ref 1.0–3.6)
MCH: 30.1 pg (ref 26.0–34.0)
MCHC: 32.8 g/dL (ref 32.0–36.0)
MCV: 91.8 fL (ref 80.0–100.0)
MONO ABS: 0.4 10*3/uL (ref 0.2–0.9)
MONOS PCT: 7 %
Neutro Abs: 3.5 10*3/uL (ref 1.4–6.5)
Neutrophils Relative %: 57 %
PLATELETS: 252 10*3/uL (ref 150–440)
RBC: 4.12 MIL/uL (ref 3.80–5.20)
RDW: 12.6 % (ref 11.5–14.5)
WBC: 6.2 10*3/uL (ref 3.6–11.0)

## 2015-09-22 ENCOUNTER — Other Ambulatory Visit
Admission: RE | Admit: 2015-09-22 | Discharge: 2015-09-22 | Disposition: A | Discharge status: Home / Self Care | Payer: Managed Care, Other (non HMO) | Source: Ambulatory Visit | Attending: Psychiatry | Admitting: Psychiatry

## 2015-09-22 DIAGNOSIS — Z79899 Other long term (current) drug therapy: Secondary | ICD-10-CM | POA: Diagnosis not present

## 2015-09-22 LAB — CBC WITH DIFFERENTIAL/PLATELET
Basophils Absolute: 0 10*3/uL (ref 0–0.1)
Basophils Relative: 1 %
EOS ABS: 0.1 10*3/uL (ref 0–0.7)
Eosinophils Relative: 2 %
HEMATOCRIT: 36.6 % (ref 35.0–47.0)
Hemoglobin: 12.2 g/dL (ref 12.0–16.0)
LYMPHS PCT: 30 %
Lymphs Abs: 2 10*3/uL (ref 1.0–3.6)
MCH: 30.3 pg (ref 26.0–34.0)
MCHC: 33.4 g/dL (ref 32.0–36.0)
MCV: 90.6 fL (ref 80.0–100.0)
MONOS PCT: 7 %
Monocytes Absolute: 0.5 10*3/uL (ref 0.2–0.9)
NEUTROS ABS: 4.2 10*3/uL (ref 1.4–6.5)
Neutrophils Relative %: 60 %
Platelets: 229 10*3/uL (ref 150–440)
RBC: 4.04 MIL/uL (ref 3.80–5.20)
RDW: 12.4 % (ref 11.5–14.5)
WBC: 6.8 10*3/uL (ref 3.6–11.0)

## 2015-11-01 ENCOUNTER — Other Ambulatory Visit
Admission: RE | Admit: 2015-11-01 | Discharge: 2015-11-01 | Disposition: A | Discharge status: Home / Self Care | Payer: Managed Care, Other (non HMO) | Source: Ambulatory Visit | Attending: Psychiatry | Admitting: Psychiatry

## 2015-11-01 DIAGNOSIS — Z79899 Other long term (current) drug therapy: Secondary | ICD-10-CM | POA: Insufficient documentation

## 2015-11-01 DIAGNOSIS — F259 Schizoaffective disorder, unspecified: Secondary | ICD-10-CM | POA: Diagnosis present

## 2015-11-01 LAB — CBC WITH DIFFERENTIAL/PLATELET
Basophils Absolute: 0 10*3/uL (ref 0–0.1)
Basophils Relative: 1 %
EOS ABS: 0.2 10*3/uL (ref 0–0.7)
EOS PCT: 2 %
HCT: 38.2 % (ref 35.0–47.0)
Hemoglobin: 12.5 g/dL (ref 12.0–16.0)
LYMPHS ABS: 1.7 10*3/uL (ref 1.0–3.6)
LYMPHS PCT: 25 %
MCH: 29.6 pg (ref 26.0–34.0)
MCHC: 32.6 g/dL (ref 32.0–36.0)
MCV: 90.7 fL (ref 80.0–100.0)
MONO ABS: 0.5 10*3/uL (ref 0.2–0.9)
MONOS PCT: 7 %
Neutro Abs: 4.4 10*3/uL (ref 1.4–6.5)
Neutrophils Relative %: 65 %
PLATELETS: 240 10*3/uL (ref 150–440)
RBC: 4.21 MIL/uL (ref 3.80–5.20)
RDW: 12.5 % (ref 11.5–14.5)
WBC: 6.7 10*3/uL (ref 3.6–11.0)

## 2015-11-27 ENCOUNTER — Other Ambulatory Visit
Admission: RE | Admit: 2015-11-27 | Discharge: 2015-11-27 | Disposition: A | Discharge status: Home / Self Care | Payer: Managed Care, Other (non HMO) | Source: Ambulatory Visit | Attending: Psychiatry | Admitting: Psychiatry

## 2015-11-27 DIAGNOSIS — F259 Schizoaffective disorder, unspecified: Secondary | ICD-10-CM | POA: Diagnosis present

## 2015-11-27 DIAGNOSIS — Z79899 Other long term (current) drug therapy: Secondary | ICD-10-CM | POA: Diagnosis not present

## 2015-11-27 LAB — CBC WITH DIFFERENTIAL/PLATELET
BASOS PCT: 1 %
Basophils Absolute: 0 10*3/uL (ref 0–0.1)
EOS ABS: 0.1 10*3/uL (ref 0–0.7)
Eosinophils Relative: 2 %
HCT: 36.5 % (ref 35.0–47.0)
Hemoglobin: 11.9 g/dL — ABNORMAL LOW (ref 12.0–16.0)
Lymphocytes Relative: 25 %
Lymphs Abs: 1.5 10*3/uL (ref 1.0–3.6)
MCH: 29.8 pg (ref 26.0–34.0)
MCHC: 32.8 g/dL (ref 32.0–36.0)
MCV: 90.8 fL (ref 80.0–100.0)
MONO ABS: 0.3 10*3/uL (ref 0.2–0.9)
MONOS PCT: 5 %
Neutro Abs: 3.9 10*3/uL (ref 1.4–6.5)
Neutrophils Relative %: 67 %
Platelets: 234 10*3/uL (ref 150–440)
RBC: 4.02 MIL/uL (ref 3.80–5.20)
RDW: 12.9 % (ref 11.5–14.5)
WBC: 5.8 10*3/uL (ref 3.6–11.0)

## 2015-12-08 ENCOUNTER — Ambulatory Visit (INDEPENDENT_AMBULATORY_CARE_PROVIDER_SITE_OTHER): Payer: Managed Care, Other (non HMO) | Admitting: Family Medicine

## 2015-12-08 ENCOUNTER — Encounter: Payer: Self-pay | Admitting: Family Medicine

## 2015-12-08 VITALS — BP 112/64 | HR 87 | Temp 97.8°F | Resp 16 | Ht 66.0 in | Wt 137.8 lb

## 2015-12-08 DIAGNOSIS — R21 Rash and other nonspecific skin eruption: Secondary | ICD-10-CM | POA: Insufficient documentation

## 2015-12-08 DIAGNOSIS — K5909 Other constipation: Secondary | ICD-10-CM | POA: Insufficient documentation

## 2015-12-08 DIAGNOSIS — Z79899 Other long term (current) drug therapy: Secondary | ICD-10-CM | POA: Insufficient documentation

## 2015-12-08 DIAGNOSIS — R109 Unspecified abdominal pain: Secondary | ICD-10-CM

## 2015-12-08 DIAGNOSIS — D509 Iron deficiency anemia, unspecified: Secondary | ICD-10-CM | POA: Insufficient documentation

## 2015-12-08 DIAGNOSIS — K5900 Constipation, unspecified: Secondary | ICD-10-CM | POA: Diagnosis not present

## 2015-12-08 DIAGNOSIS — R151 Fecal smearing: Secondary | ICD-10-CM | POA: Diagnosis not present

## 2015-12-08 DIAGNOSIS — E781 Pure hyperglyceridemia: Secondary | ICD-10-CM | POA: Insufficient documentation

## 2015-12-08 DIAGNOSIS — F250 Schizoaffective disorder, bipolar type: Secondary | ICD-10-CM | POA: Insufficient documentation

## 2015-12-08 DIAGNOSIS — G4700 Insomnia, unspecified: Secondary | ICD-10-CM | POA: Insufficient documentation

## 2015-12-08 DIAGNOSIS — N951 Menopausal and female climacteric states: Secondary | ICD-10-CM | POA: Insufficient documentation

## 2015-12-08 DIAGNOSIS — L309 Dermatitis, unspecified: Secondary | ICD-10-CM | POA: Insufficient documentation

## 2015-12-08 DIAGNOSIS — R739 Hyperglycemia, unspecified: Secondary | ICD-10-CM | POA: Insufficient documentation

## 2015-12-08 DIAGNOSIS — R438 Other disturbances of smell and taste: Secondary | ICD-10-CM | POA: Insufficient documentation

## 2015-12-08 DIAGNOSIS — L03039 Cellulitis of unspecified toe: Secondary | ICD-10-CM | POA: Insufficient documentation

## 2015-12-08 DIAGNOSIS — Z789 Other specified health status: Secondary | ICD-10-CM | POA: Insufficient documentation

## 2015-12-08 LAB — POC HEMOCCULT BLD/STL (OFFICE/1-CARD/DIAGNOSTIC): OCCULT BLOOD DATE: NEGATIVE

## 2015-12-08 MED ORDER — LINACLOTIDE 145 MCG PO CAPS: 145.0000 ug | ORAL_CAPSULE | Freq: Every day | ORAL | Status: DC

## 2015-12-08 MED ORDER — RANITIDINE HCL 150 MG PO TABS
150.0000 mg | ORAL_TABLET | Freq: Two times a day (BID) | ORAL | Status: DC
Start: 2015-12-08 — End: 2016-02-20

## 2015-12-08 NOTE — Progress Notes (Signed)
Name: Carla Thompson   MRN: [id number]    DOB: December 26, 1960   Date:12/08/2015       Progress Note  Subjective  Chief Complaint  Chief Complaint  Patient presents with  . GI Problem  . Anxiety    patient has not been sleeping well due to the GI issue    HPI  Chronic Constipation:  She has a long history of constipation, for two weeks she has noticed more straining and soiling, no blood in stools, no mucus, no fever, no vomiting, occasionally has nausea. She is able to eat. She has abdominal pain, it is making her feel very anxious and has not been sleeping for the past 2 nights. She was seen by Psychiatrist two days ago and was given milk of magnesia and sennakot but daughter is not sure that she has been taking it. Pain in her abdominal is described as sharp, and feels like her stomach is rock hard ( distended ). She had similar episodes in the past but she is not sure what helped with symptoms.   Patient Active Problem List   Diagnosis Date Noted  . Other long term (current) drug therapy 12/08/2015  . Chronic constipation 12/08/2015  . Insomnia, persistent 12/08/2015  . Dermatitis, eczematoid 12/08/2015  . Gravida 2 para 2 12/08/2015  . Blood glucose elevated 12/08/2015  . Hypertriglyceridemia 12/08/2015  . Anemia, iron deficiency 12/08/2015  . Metallic taste 99991111  . 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 Cutaneous eruption 12/08/2015  . Schizoaffective disorder, bipolar type ([place]) 12/08/2015    Past Surgical History  Procedure Laterality Date  . Tubal ligation  12/02/1994  . Anus surgery  2000    [place]    Family History  Problem Relation Age of Onset  . Mental illness Sister     Schizophrenia and bipolar  . Cancer Maternal Grandmother     Colon  . Mental illness Paternal Grandmother     Schizophrenia    Social History   Social History  . Marital Status: Married    Spouse Name: N/A  . Number of Children: N/A  . Years of Education: N/A   Occupational  History  . Not on file.   Social History Main Topics  . Smoking status: Never Smoker   . Smokeless tobacco: Not on file  . Alcohol Use: No  . Drug Use: No  . Sexual Activity: Not Currently   Other Topics Concern  . Not on file   Social History Narrative     Current outpatient prescriptions:  .  Ascorbic Acid 500 MG CAPS, Take by mouth., Disp: , Rfl:  .  B COMPLEX VITAMINS PO, Take by mouth., Disp: , Rfl:  .  clonazePAM (KLONOPIN) 0.5 MG tablet, Take by mouth., Disp: , Rfl:  .  cloZAPine (CLOZARIL) 100 MG tablet, TK 1 T PO QAM AND 3 HS, Disp: , Rfl: 0 .  docusate sodium (STOOL SOFTENER) 100 MG capsule, Take by mouth., Disp: , Rfl:  .  Linaclotide (LINZESS) 145 MCG CAPS capsule, Take 1 capsule (145 mcg total) by mouth daily., Disp: 30 capsule, Rfl: 2 .  Magnesium Gluconate 250 MG TABS, Take by mouth., Disp: , Rfl:  .  Melatonin 5 MG CAPS, Take by mouth., Disp: , Rfl:  .  naftifine (NAFTIN) 1 % cream, NAFTIN, 1% (External Cream) - Historical Medication  (1 %) Active, Disp: , Rfl:  .  ranitidine (ZANTAC) 150 MG tablet, Take 1 tablet (150 mg total) by mouth 2 (two)  times daily., Disp: 60 tablet, Rfl: 3 .  vitamin E 1000 UNIT capsule, Take by mouth., Disp: , Rfl:   Allergies  Allergen Reactions  . Escitalopram   . Levsin  [Hyoscyamine Sulfate]      ROS  Ten systems reviewed and is negative except as mentioned in HPI   Objective  Filed Vitals:   12/08/15 1205  BP: 112/64  Pulse: 87  Temp: 97.8 F (36.6 C)  TempSrc: Oral  Resp: 16  Height: 5\' 6"  (1.676 m)  Weight: 137 lb 12.8 oz (62.506 kg)  SpO2: 97%    Body mass index is 22.25 kg/(m^2).  Physical Exam  Constitutional: Patient appears well-developed and well-nourished.  No distress. She is anxious - nervous about her abdominal pain HEENT: head atraumatic, normocephalic, pupils equal and reactive to light, neck supple, throat within normal limits Cardiovascular: Normal rate, regular rhythm and normal heart  sounds.  No murmur heard. No BLE edema. Pulmonary/Chest: Effort normal and breath sounds normal. No respiratory distress. Abdominal: Soft.  Diffuse abdominal tenderness, increase in bowel sounds, mild distension, rectal exam was normal, no masses, no blood or mucus, brown stools on glove , hemoccult negative Psychiatric: Patient has a normal mood and affect. behavior is normal. Judgment and thought content normal.  Recent Results (from the past 2160 hour(s))  CBC with Differential/Platelet     Status: None   Collection Time: 09/22/15  3:40 PM  Result Value Ref Range   WBC 6.8 3.6 - 11.0 K/uL   RBC 4.04 3.80 - 5.20 MIL/uL   Hemoglobin 12.2 12.0 - 16.0 g/dL   HCT 36.6 35.0 - 47.0 %   MCV 90.6 80.0 - 100.0 fL   MCH 30.3 26.0 - 34.0 pg   MCHC 33.4 32.0 - 36.0 g/dL   RDW 12.4 11.5 - 14.5 %   Platelets 229 150 - 440 K/uL   Neutrophils Relative % 60 %   Neutro Abs 4.2 1.4 - 6.5 K/uL   Lymphocytes Relative 30 %   Lymphs Abs 2.0 1.0 - 3.6 K/uL   Monocytes Relative 7 %   Monocytes Absolute 0.5 0.2 - 0.9 K/uL   Eosinophils Relative 2 %   Eosinophils Absolute 0.1 0 - 0.7 K/uL   Basophils Relative 1 %   Basophils Absolute 0.0 0 - 0.1 K/uL  CBC with Differential/Platelet     Status: None   Collection Time: 11/01/15  1:05 PM  Result Value Ref Range   WBC 6.7 3.6 - 11.0 K/uL   RBC 4.21 3.80 - 5.20 MIL/uL   Hemoglobin 12.5 12.0 - 16.0 g/dL   HCT 38.2 35.0 - 47.0 %   MCV 90.7 80.0 - 100.0 fL   MCH 29.6 26.0 - 34.0 pg   MCHC 32.6 32.0 - 36.0 g/dL   RDW 12.5 11.5 - 14.5 %   Platelets 240 150 - 440 K/uL   Neutrophils Relative % 65 %   Neutro Abs 4.4 1.4 - 6.5 K/uL   Lymphocytes Relative 25 %   Lymphs Abs 1.7 1.0 - 3.6 K/uL   Monocytes Relative 7 %   Monocytes Absolute 0.5 0.2 - 0.9 K/uL   Eosinophils Relative 2 %   Eosinophils Absolute 0.2 0 - 0.7 K/uL   Basophils Relative 1 %   Basophils Absolute 0.0 0 - 0.1 K/uL  CBC with Differential/Platelet     Status: Abnormal   Collection Time:  11/27/15 12:03 PM  Result Value Ref Range   WBC 5.8 3.6 - 11.0 K/uL  RBC 4.02 3.80 - 5.20 MIL/uL   Hemoglobin 11.9 (L) 12.0 - 16.0 g/dL   HCT 36.5 35.0 - 47.0 %   MCV 90.8 80.0 - 100.0 fL   MCH 29.8 26.0 - 34.0 pg   MCHC 32.8 32.0 - 36.0 g/dL   RDW 12.9 11.5 - 14.5 %   Platelets 234 150 - 440 K/uL   Neutrophils Relative % 67 %   Neutro Abs 3.9 1.4 - 6.5 K/uL   Lymphocytes Relative 25 %   Lymphs Abs 1.5 1.0 - 3.6 K/uL   Monocytes Relative 5 %   Monocytes Absolute 0.3 0.2 - 0.9 K/uL   Eosinophils Relative 2 %   Eosinophils Absolute 0.1 0 - 0.7 K/uL   Basophils Relative 1 %   Basophils Absolute 0.0 0 - 0.1 K/uL  POC Hemoccult Bld/Stl (1-Cd Office Dx)     Status: Normal   Collection Time: 12/08/15  1:18 PM  Result Value Ref Range   Card #1 Date negative    Fecal Occult Blood, POC  Negative     PHQ2/9: Depression screen PHQ 2/9 12/08/2015  Decreased Interest 0  Down, Depressed, Hopeless 0  PHQ - 2 Score 0    Fall Risk: Fall Risk  12/08/2015  Falls in the past year? No    Functional Status Survey: Is the patient deaf or have difficulty hearing?: No Does the patient have difficulty seeing, even when wearing glasses/contacts?: No Does the patient have difficulty concentrating, remembering, or making decisions?: Yes Does the patient have difficulty walking or climbing stairs?: No Does the patient have difficulty dressing or bathing?: No Does the patient have difficulty doing errands alone such as visiting a doctor's office or shopping?: No    Assessment & Plan    1. Chronic constipation  - Linaclotide (LINZESS) 145 MCG CAPS capsule; Take 1 capsule (145 mcg total) by mouth daily.  Dispense: 30 capsule; Refill: 2  2. Fecal soiling  Likely from constipation   3. Abdominal pain in female   she is afraid of having labs done, it may be from constipation , however rectal exam did not show any impacted stool We will try Ranitidine and gas x also  - ranitidine (ZANTAC)  150 MG tablet; Take 1 tablet (150 mg total) by mouth 2 (two) times daily.  Dispense: 60 tablet; Refill: 3 - POC Hemoccult Bld/Stl (1-Cd Office Dx) -abdominal X-ray

## 2015-12-09 ENCOUNTER — Encounter: Payer: Self-pay | Admitting: Emergency Medicine

## 2015-12-09 ENCOUNTER — Emergency Department
Admission: EM | Admit: 2015-12-09 | Discharge: 2015-12-11 | Disposition: A | Discharge status: Other Acute Inpatient Hospital | Payer: Managed Care, Other (non HMO) | Attending: Emergency Medicine | Admitting: Emergency Medicine

## 2015-12-09 DIAGNOSIS — F919 Conduct disorder, unspecified: Secondary | ICD-10-CM | POA: Diagnosis not present

## 2015-12-09 DIAGNOSIS — F250 Schizoaffective disorder, bipolar type: Secondary | ICD-10-CM | POA: Diagnosis not present

## 2015-12-09 DIAGNOSIS — G4700 Insomnia, unspecified: Secondary | ICD-10-CM | POA: Diagnosis present

## 2015-12-09 DIAGNOSIS — Z79899 Other long term (current) drug therapy: Secondary | ICD-10-CM | POA: Insufficient documentation

## 2015-12-09 DIAGNOSIS — F29 Unspecified psychosis not due to a substance or known physiological condition: Secondary | ICD-10-CM | POA: Diagnosis not present

## 2015-12-09 DIAGNOSIS — F309 Manic episode, unspecified: Secondary | ICD-10-CM | POA: Insufficient documentation

## 2015-12-09 DIAGNOSIS — R4689 Other symptoms and signs involving appearance and behavior: Secondary | ICD-10-CM

## 2015-12-09 DIAGNOSIS — F22 Delusional disorders: Secondary | ICD-10-CM | POA: Diagnosis present

## 2015-12-09 LAB — COMPREHENSIVE METABOLIC PANEL
ALT: 17 U/L (ref 14–54)
AST: 15 U/L (ref 15–41)
Albumin: 4.7 g/dL (ref 3.5–5.0)
Alkaline Phosphatase: 81 U/L (ref 38–126)
Anion gap: 9 (ref 5–15)
BILIRUBIN TOTAL: 0.6 mg/dL (ref 0.3–1.2)
BUN: 10 mg/dL (ref 6–20)
CO2: 24 mmol/L (ref 22–32)
CREATININE: 0.89 mg/dL (ref 0.44–1.00)
Calcium: 9.8 mg/dL (ref 8.9–10.3)
Chloride: 109 mmol/L (ref 101–111)
GFR calc Af Amer: >60 mL/min (ref >60)
Glucose, Bld: 132 mg/dL — ABNORMAL HIGH (ref 65–99)
Potassium: 4.6 mmol/L (ref 3.5–5.1)
Sodium: 142 mmol/L (ref 135–145)
TOTAL PROTEIN: 8 g/dL (ref 6.5–8.1)

## 2015-12-09 LAB — CBC
HEMATOCRIT: 41.9 % (ref 35.0–47.0)
HEMOGLOBIN: 13.8 g/dL (ref 12.0–16.0)
MCH: 30 pg (ref 26.0–34.0)
MCHC: 33 g/dL (ref 32.0–36.0)
MCV: 91 fL (ref 80.0–100.0)
Platelets: 339 10*3/uL (ref 150–440)
RBC: 4.61 MIL/uL (ref 3.80–5.20)
RDW: 13.2 % (ref 11.5–14.5)
WBC: 10.1 10*3/uL (ref 3.6–11.0)

## 2015-12-09 LAB — DIFFERENTIAL
Basophils Absolute: 0.1 10*3/uL (ref 0–0.1)
Basophils Relative: 1 %
EOS ABS: 0 10*3/uL (ref 0–0.7)
EOS PCT: 1 %
LYMPHS PCT: 14 %
Lymphs Abs: 1.4 10*3/uL (ref 1.0–3.6)
Monocytes Absolute: 0.7 10*3/uL (ref 0.2–0.9)
Monocytes Relative: 7 %
NEUTROS PCT: 79 %
Neutro Abs: 7.9 10*3/uL — ABNORMAL HIGH (ref 1.4–6.5)

## 2015-12-09 LAB — SALICYLATE LEVEL: Salicylate Lvl: <4 mg/dL (ref 2.8–30.0)

## 2015-12-09 LAB — ACETAMINOPHEN LEVEL: Acetaminophen (Tylenol), Serum: <10 ug/mL — ABNORMAL LOW (ref 10–30)

## 2015-12-09 LAB — ETHANOL

## 2015-12-09 MED ORDER — CLOZAPINE 25 MG PO TABS
100.0000 mg | ORAL_TABLET | Freq: Every day | ORAL | Status: DC
  Administered 2015-12-09 – 2015-12-11 (×3): 100 mg via ORAL
  Filled 2015-12-09: qty 1
  Filled 2015-12-09: qty 4

## 2015-12-09 MED ORDER — CLOZAPINE 100 MG PO TABS
ORAL_TABLET | ORAL | Status: AC
  Administered 2015-12-09: 100 mg via ORAL
  Filled 2015-12-09: qty 1

## 2015-12-09 MED ORDER — TRAZODONE HCL 100 MG PO TABS
ORAL_TABLET | ORAL | Status: AC
  Administered 2015-12-09: 100 mg via ORAL
  Filled 2015-12-09: qty 1

## 2015-12-09 MED ORDER — CLONAZEPAM 0.5 MG PO TABS
ORAL_TABLET | ORAL | Status: AC
  Administered 2015-12-09: 0.5 mg via ORAL
  Filled 2015-12-09: qty 1

## 2015-12-09 MED ORDER — HALOPERIDOL 5 MG PO TABS
5.0000 mg | ORAL_TABLET | Freq: Once | ORAL | Status: AC
  Administered 2015-12-09: 5 mg via ORAL

## 2015-12-09 MED ORDER — TRAZODONE HCL 100 MG PO TABS
100.0000 mg | ORAL_TABLET | Freq: Every day | ORAL | Status: DC
  Administered 2015-12-09 – 2015-12-10 (×2): 100 mg via ORAL
  Filled 2015-12-09: qty 1

## 2015-12-09 MED ORDER — LORAZEPAM 2 MG PO TABS
2.0000 mg | ORAL_TABLET | Freq: Once | ORAL | Status: AC
  Administered 2015-12-09: 2 mg via ORAL
  Filled 2015-12-09: qty 1

## 2015-12-09 MED ORDER — CLONAZEPAM 0.5 MG PO TABS
0.5000 mg | ORAL_TABLET | Freq: Three times a day (TID) | ORAL | Status: DC | PRN
  Administered 2015-12-09 – 2015-12-10 (×2): 0.5 mg via ORAL
  Filled 2015-12-09: qty 1

## 2015-12-09 MED ORDER — HALOPERIDOL 5 MG PO TABS
ORAL_TABLET | ORAL | Status: AC
  Administered 2015-12-09: 5 mg via ORAL
  Filled 2015-12-09: qty 1

## 2015-12-09 NOTE — ED Notes (Signed)
BEHAVIORAL HEALTH ROUNDING  Patient sleeping: No.  Patient alert and oriented: alert  Behavior appropriate: No. ; If no, describe: psychotic Nutrition and fluids offered: Yes  Toileting and hygiene offered: Yes  Sitter present: not applicable, Q 15 min safety rounds and observation via security camera. Law enforcement present: Yes ODS

## 2015-12-09 NOTE — ED Notes (Signed)
Pt rstless in bed, has made several attempts to leave, but is not difficult to get back to bed.  Pt sts that "I'm on fire".

## 2015-12-09 NOTE — ED Notes (Signed)
Pt needing constant reassurance that she is okay. Repeats "help me, i need help, i'm going to die, help me."

## 2015-12-09 NOTE — BH Assessment (Signed)
Writer made several attempts to assess patient but was unsuccessful. She states she have a daughter but this Probation officer was unable to reach her daughter ([phone]).

## 2015-12-09 NOTE — ED Notes (Signed)

## 2015-12-09 NOTE — ED Notes (Addendum)
Pt noted to be incontinent of urine while standing in dayroom. Pt assisted to bathroom and cleansed of urine and placed in new scrubs.

## 2015-12-09 NOTE — ED Notes (Signed)
Pt seems to calm with positive verbal reassurance.

## 2015-12-09 NOTE — ED Provider Notes (Signed)
[facility] Emergency Department Provider Note   ____________________________________________  Time seen: 1520  I have reviewed the triage vital signs and the nursing notes.   HISTORY  Chief Complaint Manic Behavior; Delusional; and Insomnia   History limited by: Not cooperative   HPI Carla Thompson is a 55 y.o. female with history of schizoaffective disorder bipolar type. Presents to the emergency department because family concerns for abnormal behavior. The patient herself will not answer my questions. Per triage note the patient has not been sleeping for a couple of days. The patient herself just repeats help me. She would nod or could not say why she was in the emergency department today. She did not answer in the affirmative to any questions about recent illnesses, chest pain, shortness of breath, nausea vomiting or diarrhea.   Past Medical History  Diagnosis Date  . Dermatitis   . Bowel incontinence   . Hypertriglyceridemia   . Chronic constipation   . High risk medication use   . Abnormal perimenopausal bleeding   . Hyperglycemia   . Metallic taste   . Schizoaffective disorder, bipolar type ([facility])     Dr. Tyrone Sage  . 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 Other long term (current) drug therapy 12/08/2015  . Chronic constipation 12/08/2015  . Insomnia, persistent 12/08/2015  . Dermatitis, eczematoid 12/08/2015  . Gravida 2 para 2 12/08/2015  . Blood glucose elevated 12/08/2015  . Hypertriglyceridemia 12/08/2015  . Anemia, iron deficiency 12/08/2015  . Metallic taste 99991111  . 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 Cutaneous eruption 12/08/2015  . Schizoaffective disorder, bipolar type ([place]) 12/08/2015    Past Surgical History  Procedure Laterality Date  . Tubal ligation  12/02/1994  . Anus surgery  2000    [place]     Current Outpatient Rx  Name  Route  Sig  Dispense  Refill  . Ascorbic Acid 500 MG CAPS   Oral   Take by mouth.         . B COMPLEX VITAMINS PO   Oral   Take by mouth.         . clonazePAM (KLONOPIN) 0.5 MG tablet   Oral   Take by mouth.         . cloZAPine (CLOZARIL) 100 MG tablet      TK 1 T PO QAM AND 3 HS      0   . docusate sodium (STOOL SOFTENER) 100 MG capsule   Oral   Take by mouth.         Marland Kitchen Linaclotide (LINZESS) 145 MCG CAPS capsule   Oral   Take 1 capsule (145 mcg total) by mouth daily.   30 capsule   2   . Magnesium Gluconate 250 MG TABS   Oral   Take by mouth.         . Melatonin 5 MG CAPS   Oral   Take by mouth.         . naftifine (NAFTIN) 1 % cream      NAFTIN, 1% (External Cream) - Historical Medication  (1 %) Active         . ranitidine (ZANTAC) 150 MG tablet   Oral   Take 1 tablet (150 mg total) by mouth 2 (two) times daily.   60 tablet   3   . vitamin E  1000 UNIT capsule   Oral   Take by mouth.           Allergies Escitalopram and Levsin   Family History  Problem Relation Age of Onset  . Mental illness Sister     Schizophrenia and bipolar  . Cancer Maternal Grandmother     Colon  . Mental illness Paternal Grandmother     Schizophrenia    Social History Social History  Substance Use Topics  . Smoking status: Never Smoker   . Smokeless tobacco: None  . Alcohol Use: No    Review of Systems  Constitutional: Negative for fever. Cardiovascular: Negative for chest pain. Respiratory: Negative for shortness of breath. Gastrointestinal: Negative for abdominal pain, vomiting and diarrhea. Neurological: Negative for headaches, focal weakness or numbness.  10-point ROS otherwise negative.  ____________________________________________   PHYSICAL EXAM:  VITAL SIGNS: ED Triage Vitals  Enc Vitals Group     BP --      Pulse Rate 12/09/15 1315 127     Resp --      Temp 12/09/15 1315 98.4 F (36.9  C)     Temp Source 12/09/15 1315 Oral     SpO2 12/09/15 1315 97 %     Weight 12/09/15 1315 137 lb (62.143 kg)     Height 12/09/15 1315 5\' 4"  (1.626 m)     Head Cir --      Peak Flow --      Pain Score 12/09/15 1315 10   Constitutional: Awake and alert. Unclear if oriented. Eyes: Conjunctivae are normal. PERRL. Normal extraocular movements. ENT   Head: Normocephalic and atraumatic.   Nose: No congestion/rhinnorhea.   Mouth/Throat: Mucous membranes are moist.   Neck: No stridor. Hematological/Lymphatic/Immunilogical: No cervical lymphadenopathy. Cardiovascular: Normal rate, regular rhythm.  No murmurs, rubs, or gallops. Respiratory: Normal respiratory effort without tachypnea nor retractions. Breath sounds are clear and equal bilaterally. No wheezes/rales/rhonchi. Gastrointestinal: Soft and nontender. No distention.  Genitourinary: Deferred Musculoskeletal: Normal range of motion in all extremities. No joint effusions.  No lower extremity tenderness nor edema. Neurologic:  No gross focal neurologic deficits are appreciated.  Skin:  Skin is warm, dry and intact. No rash noted. Psychiatric: Patient did not answer any questions about SI or HI. Does repeat "help me"  ____________________________________________    LABS (pertinent positives/negatives)  Labs Reviewed  COMPREHENSIVE METABOLIC PANEL - Abnormal; Notable for the following:    Glucose, Bld 132 (*)    All other components within normal limits  ACETAMINOPHEN LEVEL - Abnormal; Notable for the following:    Acetaminophen (Tylenol), Serum <10 (*)    All other components within normal limits  ETHANOL  SALICYLATE LEVEL  CBC  URINE DRUG SCREEN, QUALITATIVE ([facility] ONLY)  POC URINE PREG, ED   ____________________________________________   EKG  None  ____________________________________________    RADIOLOGY  None   ____________________________________________   PROCEDURES  Procedure(s) performed:  None  Critical Care performed: No  ____________________________________________   INITIAL IMPRESSION / ASSESSMENT AND PLAN / ED COURSE  Pertinent labs & imaging results that were available during my care of the patient were reviewed by me and considered in my medical decision making (see chart for details).  Patient presented to the emergency department today brought in by family because of concerns for manic behavior. Patient herself is unable to give any good history. She did mention help me. Given that patient cannot only whether or not she has any suicidal or homicidal ideation and that she appears to  be in some distress will place in her IVC for further evaluation by psychiatry.  ____________________________________________   FINAL CLINICAL IMPRESSION(S) / ED DIAGNOSES  Final diagnoses:  Abnormal behavior     Nance Pear, MD 12/09/15 1552

## 2015-12-09 NOTE — ED Notes (Signed)
ENVIRONMENTAL ASSESSMENT  Potentially harmful objects out of patient reach: Yes.  Personal belongings secured: Yes.  Patient dressed in hospital provided attire only: Yes.  Plastic bags out of patient reach: Yes.  Patient care equipment (cords, cables, call bells, lines, and drains) shortened, removed, or accounted for: Yes.  Equipment and supplies removed from bottom of stretcher: Yes.  Potentially toxic materials out of patient reach: Yes.  Sharps container removed or out of patient reach: Yes.   BEHAVIORAL HEALTH ROUNDING  Patient sleeping: No.  Patient alert and oriented: pt is alert but only oriented to self and possibly place Behavior appropriate: No . ; If no, describe: Pt is exhibiting symptoms of psychosis. Nutrition and fluids offered: Yes  Toileting and hygiene offered: Yes  Sitter present: not applicable, Q 15 min safety rounds and observation via security camera. Law enforcement present: Yes ODS  ED [place]  Is the patient under IVC or is there intent for IVC: Yes.  Is the patient medically cleared: Yes.  Is there vacancy in the ED BHU: Yes.  Is the population mix appropriate for patient: Yes.  Is the patient awaiting placement in inpatient or outpatient setting: Yes.  Has the patient had a psychiatric consult: No Survey of unit performed for contraband, proper placement and condition of furniture, tampering with fixtures in bathroom, shower, and each patient room: Yes. ; Findings: All clear  APPEARANCE/BEHAVIOR  Restless, disheveled, exhibiting signs of psychosis stating she is going to die and she is on fire.  NEURO ASSESSMENT  Orientation:  person  Hallucinations: No.None noted (Hallucinations)  Speech: Normal  Gait: normal  RESPIRATORY ASSESSMENT  WNL  CARDIOVASCULAR ASSESSMENT  WNL  GASTROINTESTINAL ASSESSMENT  WNL  EXTREMITIES  WNL  PLAN OF CARE  Provide calm/safe environment. Vital signs assessed twice daily. ED BHU Assessment once  each 12-hour shift. Collaborate with intake RN daily or as condition indicates. Assure the ED provider has rounded once each shift. Provide and encourage hygiene. Provide redirection as needed. Assess for escalating behavior; address immediately and inform ED provider.  Assess family dynamic and appropriateness for visitation as needed: Yes. ; If necessary, describe findings:  Educate the patient/family about BHU procedures/visitation: Yes. ; If necessary, describe findings:Will continue to monitor with Q 15 min safety rounds and observation via security camera.

## 2015-12-09 NOTE — Progress Notes (Signed)
MEDICATION RELATED CONSULT NOTE - INITIAL   Pharmacy Consult for Clozapine Indication: anti-psychotic  Allergies  Allergen Reactions  . Escitalopram   . Levsin  [Hyoscyamine Sulfate]     Patient Measurements: Height: 5\' 4"  (162.6 cm) Weight: 137 lb (62.143 kg) IBW/kg (Calculated) : 54.7 Adjusted Body Weight: na  Vital Signs: Temp: 98.4 F (36.9 C) (01/07 1315) Temp Source: Oral (01/07 1315) Pulse Rate: 127 (01/07 1315) Intake/Output from previous day:   Intake/Output from this shift:    Labs:  Recent Labs  12/09/15 1341  WBC 10.1  HGB 13.8  HCT 41.9  PLT 339  CREATININE 0.89  ALBUMIN 4.7  PROT 8.0  AST 15  ALT 17  ALKPHOS 81  BILITOT 0.6   Estimated Creatinine Clearance: 63.1 mL/min (by C-G formula based on Cr of 0.89).   Microbiology: No results found for this or any previous visit (from the past 720 hour(s)).  Medical History: Past Medical History  Diagnosis Date  . Dermatitis   . Bowel incontinence   . Hypertriglyceridemia   . Chronic constipation   . High risk medication use   . Abnormal perimenopausal bleeding   . Hyperglycemia   . Metallic taste   . Schizoaffective disorder, bipolar type ([facility])     Dr. Tyrone Sage  . 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 cloZAPine  100 mg Oral Daily  . traZODone  100 mg Oral QHS    Assessment: Patient admitted for altered mental status. Was taking Clozepine prior to admission. Checked clozepine registry and submitted lab work.  Plan:  Okay to dispense clozepine. Patient for weekly lab draws, next due 12/16/15.  Paulina Fusi, PharmD, BCPS 12/09/2015 9:12 PM

## 2015-12-09 NOTE — ED Notes (Signed)
Patient very agitated, aggressively pacing the unit, unable to respond to reality orientation or redirection. Patient grossly psychotic, states she is "on fire." Very paranoid.  Patient did take ordered medication orally with strong encouragement.  Will maintain all safety precautions, closely monitor mental status.

## 2015-12-09 NOTE — ED Notes (Signed)
Pt's daughter and husband brought pt in for manic behavior, pt states she "is on fire, we are going to be blown up, I'm going to die." Daughter states she gets this way when she has not slept in a few days. Pt pale, rocking back and forth, stating "please help me." Daughter states she has been taking her medications.

## 2015-12-09 NOTE — ED Notes (Signed)
Denies si/hi

## 2015-12-10 DIAGNOSIS — G4700 Insomnia, unspecified: Secondary | ICD-10-CM

## 2015-12-10 DIAGNOSIS — F250 Schizoaffective disorder, bipolar type: Secondary | ICD-10-CM | POA: Diagnosis not present

## 2015-12-10 LAB — HEMOGLOBIN A1C: HEMOGLOBIN A1C: 5.2 % (ref 4.0–6.0)

## 2015-12-10 LAB — TSH: TSH: 1.831 u[IU]/mL (ref 0.350–4.500)

## 2015-12-10 MED ORDER — CLOZAPINE 100 MG PO TABS
300.0000 mg | ORAL_TABLET | Freq: Every day | ORAL | Status: DC
  Filled 2015-12-10: qty 3

## 2015-12-10 MED ORDER — LORAZEPAM 2 MG/ML IJ SOLN
2.0000 mg | Freq: Once | INTRAMUSCULAR | Status: AC
  Administered 2015-12-10: 2 mg via INTRAMUSCULAR
  Filled 2015-12-10: qty 1

## 2015-12-10 MED ORDER — ZIPRASIDONE MESYLATE 20 MG IM SOLR
10.0000 mg | Freq: Once | INTRAMUSCULAR | Status: AC
  Administered 2015-12-10: 10 mg via INTRAMUSCULAR
  Filled 2015-12-10: qty 20

## 2015-12-10 MED ORDER — CLOZAPINE 25 MG PO TABS
350.0000 mg | ORAL_TABLET | Freq: Every day | ORAL | Status: DC
  Administered 2015-12-10: 350 mg via ORAL
  Filled 2015-12-10 (×2): qty 2

## 2015-12-10 MED ORDER — ZIPRASIDONE MESYLATE 20 MG IM SOLR
20.0000 mg | Freq: Once | INTRAMUSCULAR | Status: AC
  Administered 2015-12-10: 20 mg via INTRAMUSCULAR

## 2015-12-10 MED ORDER — ZIPRASIDONE MESYLATE 20 MG IM SOLR
INTRAMUSCULAR | Status: AC
  Administered 2015-12-10: 20 mg via INTRAMUSCULAR
  Filled 2015-12-10: qty 20

## 2015-12-10 MED ORDER — LORAZEPAM 1 MG PO TABS
1.0000 mg | ORAL_TABLET | Freq: Three times a day (TID) | ORAL | Status: DC
  Administered 2015-12-11: 1 mg via ORAL
  Filled 2015-12-10: qty 1

## 2015-12-10 MED ORDER — LORAZEPAM 2 MG/ML IJ SOLN
1.0000 mg | Freq: Three times a day (TID) | INTRAMUSCULAR | Status: AC
  Administered 2015-12-10: 1 mg via INTRAMUSCULAR
  Filled 2015-12-10: qty 1
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
## 2015-12-10 NOTE — ED Notes (Signed)
BEHAVIORAL HEALTH ROUNDING Patient sleeping: Yes.   Patient alert and oriented: not applicable SLEEPING Behavior appropriate: Yes.  ; If no, describe: SLEEPING Nutrition and fluids offered: No SLEEPING Toileting and hygiene offered: NoSLEEPING Sitter present: not applicable, Q 15 min safety rounds and observation via security camera. Law enforcement present: Yes ODS 
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
## 2015-12-10 NOTE — ED Notes (Signed)
BEHAVIORAL HEALTH ROUNDING  Patient sleeping: No.  Patient alert and oriented: alert  Behavior appropriate: No. ; If no, describe: psychotic  Nutrition and fluids offered: Yes  Toileting and hygiene offered: Yes  Sitter present: not applicable, Q 15 min safety rounds and observation via security camera.  Law enforcement present: Yes ODS

## 2015-12-10 NOTE — ED Notes (Signed)
ED BHU [place] Is the patient under IVC or is there intent for IVC: Yes.   Is the patient medically cleared: Yes.   Is there vacancy in the ED BHU: Yes.   Is the population mix appropriate for patient: Yes.   Is the patient awaiting placement in inpatient or outpatient setting:    Has the patient had a psychiatric consult:  Consult pending.   Survey of unit performed for contraband, proper placement and condition of furniture, tampering with fixtures in bathroom, shower, and each patient room: Yes.  ; Findings:  APPEARANCE/BEHAVIOR Calm and cooperative NEURO ASSESSMENT Orientation: oriented to self   Denies pain Hallucinations: No.None noted (Hallucinations) Speech: loud - yelling - whining Gait: normal RESPIRATORY ASSESSMENT Even  Unlabored respirations  CARDIOVASCULAR ASSESSMENT Pulses equal   regular rate  Skin warm and dry   GASTROINTESTINAL ASSESSMENT no GI complaint EXTREMITIES Full ROM  PLAN OF CARE Provide calm/safe environment. Vital signs assessed twice daily. ED BHU Assessment once each 12-hour shift. Collaborate with intake RN daily or as condition indicates. Assure the ED provider has rounded once each shift. Provide and encourage hygiene. Provide redirection as needed. Assess for escalating behavior; address immediately and inform ED provider.  Assess family dynamic and appropriateness for visitation as needed: Yes.  ; If necessary, describe findings:  Educate the patient/family about BHU procedures/visitation: Yes.  ; If necessary, describe findings:

## 2015-12-10 NOTE — ED Notes (Signed)
BEHAVIORAL HEALTH ROUNDING Patient sleeping: No. Patient alert and oriented: yes Behavior appropriate: Yes.  ; If no, describe:  Nutrition and fluids offered: yes Toileting and hygiene offered: Yes  Sitter present: q15 minute observations and security  monitoring Law enforcement present: Yes  ODS  

## 2015-12-10 NOTE — ED Notes (Signed)
Pt noted to be laying on the bed but continues to move her legs and hands around.

## 2015-12-10 NOTE — ED Provider Notes (Signed)
Patient moved back to ED because of continued aggressive behavior. Security is having to continually redirect her to her room and she is becoming more and more agitated. I will give Geodon 20 mg as it appears that 10 mg did not significantly improve her agitation earlier.  Lavonia Drafts, MD 12/10/15 [phone]
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
## 2015-12-10 NOTE — ED Notes (Signed)
Per chg nurse pt transferred back to rm 23 due to her aggressive  Behavior and bothering other patients

## 2015-12-10 NOTE — BH Assessment (Signed)
Spoke with husband ([phone]). Collected collateral information to complete assessment.

## 2015-12-10 NOTE — ED Notes (Signed)
Dr Karma Greaser informed of pt continued need for redirection and new orders received.

## 2015-12-10 NOTE — ED Notes (Addendum)
Dr Billee Cashing queen  informed of pts behavior

## 2015-12-10 NOTE — ED Notes (Signed)
Pt very agitated and confused unable to follow directions , inc of urine on chair and bed in room, charge nurse notified. Pt medicated per orders available , pt already getting up and going in other pts room , pt refused any fluids
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
## 2015-12-10 NOTE — ED Notes (Signed)
Pt transferred back to room 23 - unsure as to why  Assessment completed  She denies pain - ambulating in the hallway and her room - easily rerouted

## 2015-12-10 NOTE — ED Notes (Signed)
Pt with an observed visit from her daughter and her husband  Both visits observed by [place]  - pt relations  She had been resting quietly but has now become more agitated  Continue to monitor  - PRN med to be administered

## 2015-12-10 NOTE — ED Notes (Signed)

## 2015-12-10 NOTE — BH Assessment (Signed)
Followed up with referrals: [facility] ([phone] unable to reach anyone in their admission department [place] ([phone] ext. [phone]), No Beds [facility]-([phone]), No Beds [facility] ([phone]), No Beds [phone]) - Was unable to reach anyone in their admission department Rosana Hoes Joelene Millin [phone])- No Beds

## 2015-12-10 NOTE — Progress Notes (Signed)
Pharmacy Note Called [facility] to verify clozapine dose: Per staff member, pt written for clozapine 100 mg tab, 1 in the morning and three at bedtime - last filled 12/27 (picked up 1/4), prior to that 12/2, 10/25, 9/29.

## 2015-12-10 NOTE — ED Notes (Signed)

## 2015-12-10 NOTE — ED Notes (Signed)
Pt disturbing all other patients will not respond to any directions, keeps taking off her clothes etc, will call charge nurse again

## 2015-12-10 NOTE — ED Notes (Signed)
Lunch provided along with an extra drink   Pt denies hunger - tray provided anyway

## 2015-12-10 NOTE — ED Provider Notes (Signed)
Patient with continued agitation. She remains a danger to herself and staff. I will give an additional dose of 2 mg of IM Ativan  Lavonia Drafts, MD 12/10/15 1122

## 2015-12-11 ENCOUNTER — Inpatient Hospital Stay
Admission: EM | Admit: 2015-12-11 | Discharge: 2015-12-20 | DRG: 885 | Disposition: A | Discharge status: Home / Self Care | Payer: Managed Care, Other (non HMO) | Source: Intra-hospital | Attending: Psychiatry | Admitting: Psychiatry

## 2015-12-11 DIAGNOSIS — Z9889 Other specified postprocedural states: Secondary | ICD-10-CM | POA: Diagnosis not present

## 2015-12-11 DIAGNOSIS — Z818 Family history of other mental and behavioral disorders: Secondary | ICD-10-CM | POA: Diagnosis not present

## 2015-12-11 DIAGNOSIS — R000 Tachycardia, unspecified: Secondary | ICD-10-CM | POA: Diagnosis present

## 2015-12-11 DIAGNOSIS — Z79899 Other long term (current) drug therapy: Secondary | ICD-10-CM

## 2015-12-11 DIAGNOSIS — Z9851 Tubal ligation status: Secondary | ICD-10-CM

## 2015-12-11 DIAGNOSIS — F919 Conduct disorder, unspecified: Secondary | ICD-10-CM | POA: Diagnosis not present

## 2015-12-11 DIAGNOSIS — Z888 Allergy status to other drugs, medicaments and biological substances status: Secondary | ICD-10-CM | POA: Diagnosis not present

## 2015-12-11 DIAGNOSIS — W19XXXA Unspecified fall, initial encounter: Secondary | ICD-10-CM | POA: Diagnosis present

## 2015-12-11 DIAGNOSIS — E8881 Metabolic syndrome: Secondary | ICD-10-CM | POA: Diagnosis present

## 2015-12-11 DIAGNOSIS — K5909 Other constipation: Secondary | ICD-10-CM | POA: Diagnosis present

## 2015-12-11 DIAGNOSIS — Z800 Family history of malignant neoplasm of digestive organs: Secondary | ICD-10-CM

## 2015-12-11 DIAGNOSIS — F250 Schizoaffective disorder, bipolar type: Secondary | ICD-10-CM | POA: Diagnosis present

## 2015-12-11 DIAGNOSIS — S0990XA Unspecified injury of head, initial encounter: Secondary | ICD-10-CM

## 2015-12-11 DIAGNOSIS — N390 Urinary tract infection, site not specified: Secondary | ICD-10-CM | POA: Diagnosis present

## 2015-12-11 DIAGNOSIS — G2581 Restless legs syndrome: Secondary | ICD-10-CM | POA: Diagnosis present

## 2015-12-11 DIAGNOSIS — G4700 Insomnia, unspecified: Secondary | ICD-10-CM | POA: Diagnosis present

## 2015-12-11 LAB — LIPID PANEL
CHOL/HDL RATIO: 2.3 ratio
Cholesterol: 182 mg/dL (ref 0–200)
HDL: 80 mg/dL (ref >40)
LDL Cholesterol: 86 mg/dL (ref 0–99)
Triglycerides: 80 mg/dL (ref <150)
VLDL: 16 mg/dL (ref 0–40)

## 2015-12-11 MED ORDER — LINACLOTIDE 145 MCG PO CAPS
145.0000 ug | ORAL_CAPSULE | Freq: Every day | ORAL | Status: DC
  Administered 2015-12-11 – 2015-12-16 (×6): 145 ug via ORAL
  Filled 2015-12-11 (×6): qty 1

## 2015-12-11 MED ORDER — MAGNESIUM HYDROXIDE 400 MG/5ML PO SUSP
30.0000 mL | Freq: Every day | ORAL | Status: DC | PRN
Start: 2015-12-11 — End: 2015-12-20

## 2015-12-11 MED ORDER — ACETAMINOPHEN 325 MG PO TABS
650.0000 mg | ORAL_TABLET | Freq: Four times a day (QID) | ORAL | Status: DC | PRN
  Administered 2015-12-18 – 2015-12-19 (×2): 650 mg via ORAL
  Filled 2015-12-11 (×2): qty 2

## 2015-12-11 MED ORDER — LINACLOTIDE 145 MCG PO CAPS: 145.0000 ug | ORAL_CAPSULE | Freq: Every day | ORAL | Status: DC

## 2015-12-11 MED ORDER — TRAZODONE HCL 100 MG PO TABS
100.0000 mg | ORAL_TABLET | Freq: Every day | ORAL | Status: DC
  Administered 2015-12-11 – 2015-12-12 (×2): 100 mg via ORAL
  Filled 2015-12-11 (×2): qty 1

## 2015-12-11 MED ORDER — CLOZAPINE 100 MG PO TABS
100.0000 mg | ORAL_TABLET | Freq: Every day | ORAL | Status: DC
  Administered 2015-12-12: 100 mg via ORAL
  Filled 2015-12-11: qty 1

## 2015-12-11 MED ORDER — ALUM & MAG HYDROXIDE-SIMETH 200-200-20 MG/5ML PO SUSP: 30.0000 mL | ORAL | Status: DC | PRN

## 2015-12-11 MED ORDER — CLOZAPINE 25 MG PO TABS
350.0000 mg | ORAL_TABLET | Freq: Every day | ORAL | Status: DC
  Administered 2015-12-11 – 2015-12-19 (×9): 350 mg via ORAL
  Filled 2015-12-11 (×9): qty 2

## 2015-12-11 MED ORDER — LORAZEPAM 1 MG PO TABS
1.0000 mg | ORAL_TABLET | Freq: Three times a day (TID) | ORAL | Status: DC
  Administered 2015-12-11 – 2015-12-12 (×3): 1 mg via ORAL
  Filled 2015-12-11 (×3): qty 1

## 2015-12-11 NOTE — ED Notes (Signed)
BEHAVIORAL HEALTH ROUNDING Patient sleeping: Yes.   Patient alert and oriented: not applicable Behavior appropriate: Yes.  ; If no, describe:  Pt sleeping Nutrition and fluids offered: No Toileting and hygiene offered: No Sitter present: no Law enforcement present: Yes  and ODS

## 2015-12-11 NOTE — ED Notes (Signed)
Report called to [facility] in behavorial med at this time.

## 2015-12-11 NOTE — ED Notes (Signed)
Pt taken to [place] room with assist from [place] and this RN to give pt a full body shower. Pt had strong smell of urine upon assessment.

## 2015-12-11 NOTE — Consult Note (Signed)
[facility] Face-to-Face Psychiatry Consult   Reason for Consult:  Consult follow-up for 55 year old woman with a history of schizoaffective disorder versus bipolar disorder came in to the hospital with psychosis confusion and bizarre behavior. Seen over the weekend and needing follow-up. Referring Physician:  Corky Downs Patient Identification: Carla Thompson MRN:  [id number] Principal Diagnosis: Schizoaffective disorder, bipolar type [facility]) Diagnosis:   Patient Active Problem List   Diagnosis Date Noted  . Other long term (current) drug therapy [Z79.899] 12/08/2015  . Chronic constipation [K59.00] 12/08/2015  . Insomnia, persistent [G47.00] 12/08/2015  . 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 Gravida 2 para 2 [Z33.1] 12/08/2015  . Blood glucose elevated [R73.9] 12/08/2015  . Hypertriglyceridemia [E78.1] 12/08/2015  . Anemia, iron deficiency [D50.9] 12/08/2015  . Metallic taste [id number]  . 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 Cutaneous eruption [R21] 12/08/2015  . Schizoaffective disorder, bipolar type ([place]) [F25.0] 12/08/2015    Total Time spent with patient: 25  Subjective:   Carla Thompson is a 55 y.o. female patient admitted with patient continues to be difficult to communicate with. Goes off topic very quickly. Confused seemingly bizarre thinking. Changes the topic to hyper religious things very easily. Easily distracted by the television. Has been compliant with medicine. Not acutely threatening violence or suicide. Still quite psychotic. Labs have been largely unremarkable. Vital signs stable. She was not admitted over the weekend because we did not have beds available which at this point seems to of improved.Marland Kitchen  HPI:  See note above. This 55 year old woman presented to the hospital with a worsening of psychosis. Time course is unclear. Collateral history suggests that this is a recurrent issue despite medication management. Patient denies that she's been drinking or  abusing drugs. Doesn't report any particular new stress  Past Psychiatric History: Any history of episodes of psychotic disorder with multiple prior hospitalizations  Risk to Self: Suicidal Ideation: No Suicidal Intent: No Is patient at risk for suicide?: No Suicidal Plan?: No Access to Means: No What has been your use of drugs/alcohol within the last 12 months?: No drug alcohol use How many times?: 0 Other Self Harm Risks: None Reported Triggers for Past Attempts: None known Intentional Self Injurious Behavior: None Risk to Others: Homicidal Ideation: No Thoughts of Harm to Others: No Current Homicidal Intent: No Current Homicidal Plan: No Access to Homicidal Means: No Identified Victim: None Reported History of harm to others?: No Assessment of Violence: None Noted Violent Behavior Description: None Reported Does patient have access to weapons?: No Criminal Charges Pending?: No Does patient have a court date: No Prior Inpatient Therapy: Prior Inpatient Therapy: Yes Prior Therapy Dates: 03/2013 Prior Therapy Facilty/Provider(s): [facility] (Per husband, Other places out of state) Reason for Treatment: Schizoaffective Disorder Prior Outpatient Therapy: Prior Outpatient Therapy: Yes Prior Therapy Dates: Current Prior Therapy Facilty/Provider(s): Dr. Levada Dy T. (Private Practice) Reason for Treatment: Schizoaffective Disorder Does patient have an ACCT team?: No Does patient have Intensive In-House Services?  : No Does patient have [facility] services? : No Does patient have P4CC services?: No  Past Medical History:  Past Medical History  Diagnosis Date  . Dermatitis   . Bowel incontinence   . Hypertriglyceridemia   . Chronic constipation   . High risk medication use   . Abnormal perimenopausal bleeding   . Hyperglycemia   . Metallic taste   . Schizoaffective disorder, bipolar type (HCC)     Dr. Tyrone Sage  . Iron deficiency anemia due to chronic blood loss  secondary to heavy flow    Past Surgical History  Procedure Laterality Date  . Tubal ligation  12/02/1994  . Anus surgery  2000    [place]   Family History:  Family History  Problem Relation Age of Onset  . Mental illness Sister     Schizophrenia and bipolar  . Cancer Maternal Grandmother     Colon  . Mental illness Paternal Grandmother     Schizophrenia   Family Psychiatric  History: Unknown. Social History:  History  Alcohol Use No     History  Drug Use No    Social History   Social History  . Marital Status: Married    Spouse Name: N/A  . Number of Children: N/A  . Years of Education: N/A   Social History Main Topics  . Smoking status: Never Smoker   . Smokeless tobacco: None  . Alcohol Use: No  . Drug Use: No  . Sexual Activity: Not Currently   Other Topics Concern  . None   Social History Narrative   Additional Social History:    Pain Medications: See PTA Prescriptions: See PTA Over the Counter: See PTA History of alcohol / drug use?: No history of alcohol / drug abuse Longest period of sobriety (when/how long): No known drug use Negative Consequences of Use:  (No known drug use) Withdrawal Symptoms:  (No known drug use)                     Allergies:   Allergies  Allergen Reactions  . Escitalopram Other (See Comments)    Unknown reaction  . Levsin [Hyoscyamine Sulfate] Other (See Comments)    Unknown reaction    Labs:  Results for orders placed or performed during the hospital encounter of 12/09/15 (from the past 48 hour(s))  TSH     Status: None   Collection Time: 12/10/15  2:28 PM  Result Value Ref Range   TSH 1.831 0.350 - 4.500 uIU/mL  Hemoglobin A1c     Status: None   Collection Time: 12/10/15  2:28 PM  Result Value Ref Range   Hgb A1c MFr Bld 5.2 4.0 - 6.0 %  Lipid panel     Status: None   Collection Time: 12/11/15 10:14 AM  Result Value Ref Range   Cholesterol 182 0 - 200 mg/dL   Triglycerides 80 <150 mg/dL   HDL  80 >40 mg/dL   Total CHOL/HDL Ratio 2.3 RATIO   VLDL 16 0 - 40 mg/dL   LDL Cholesterol 86 0 - 99 mg/dL    Comment:        Total Cholesterol/HDL:CHD Risk Coronary Heart Disease Risk Table                     Men   Women  1/2 Average Risk   3.4   3.3  Average Risk       5.0   4.4  2 X Average Risk   9.6   7.1  3 X Average Risk  23.4   11.0        Use the calculated Patient Ratio above and the CHD Risk Table to determine the patient's CHD Risk.        ATP III CLASSIFICATION (LDL):  <100     mg/dL   Optimal  100-129  mg/dL   Near or Above                    Optimal  130-159  mg/dL   Borderline  160-189  mg/dL   High  >190     mg/dL   Very High     Current Facility-Administered Medications  Medication Dose Route Frequency Provider Last Rate Last Dose  . cloZAPine (CLOZARIL) tablet 100 mg  100 mg Oral Daily Nance Pear, MD   100 mg at 12/11/15 0956  . cloZAPine (CLOZARIL) tablet 350 mg  350 mg Oral QHS Donita Brooks, MD   350 mg at 12/10/15 2231  . Linaclotide (LINZESS) capsule 145 mcg  145 mcg Oral Daily Gonzella Lex, MD      . LORazepam (ATIVAN) tablet 1 mg  1 mg Oral TID Donita Brooks, MD   1 mg at 12/11/15 0957  . traZODone (DESYREL) tablet 100 mg  100 mg Oral QHS Nance Pear, MD   100 mg at 12/10/15 2224   Current Outpatient Prescriptions  Medication Sig Dispense Refill  . Ascorbic Acid 500 MG CAPS Take 500 mg by mouth daily.     . B COMPLEX VITAMINS PO Take 1 tablet by mouth daily.     . clonazePAM (KLONOPIN) 0.5 MG tablet Take 0.5 mg by mouth at bedtime.     . cloZAPine (CLOZARIL) 100 MG tablet Take 1 tablet by mouth every morning and take 3 tablets (300mg ) by mouth every night at bedtime.  0  . docusate sodium (STOOL SOFTENER) 100 MG capsule Take 100 mg by mouth daily.     . Linaclotide (LINZESS) 145 MCG CAPS capsule Take 1 capsule (145 mcg total) by mouth daily. 30 capsule 2  . Magnesium Gluconate 250 MG TABS Take 250 mg by mouth daily.     . Melatonin 5 MG  CAPS Take 5 mg by mouth at bedtime.     . naftifine (NAFTIN) 1 % cream Apply 1 application topically daily.    . ranitidine (ZANTAC) 150 MG tablet Take 1 tablet (150 mg total) by mouth 2 (two) times daily. 60 tablet 3  . vitamin E 1000 UNIT capsule Take 1,000 Units by mouth daily.       Musculoskeletal: Strength & Muscle Tone: within normal limits Gait & Station: normal Patient leans: N/A  Psychiatric Specialty Exam: Review of Systems  Unable to perform ROS: psychiatric disorder    Blood pressure 119/81, pulse 88, temperature 98 F (36.7 C), temperature source Oral, resp. rate 20, height 5\' 4"  (1.626 m), weight 62.143 kg (137 lb), last menstrual period 11/27/2015, SpO2 98 %.Body mass index is 23.5 kg/(m^2).  General Appearance: Disheveled  Eye Contact::  Minimal  Speech:  Slow  Volume:  Decreased  Mood:  Anxious  Affect:  Inappropriate  Thought Process:  Disorganized  Orientation:  Full (Time, Place, and Person)  Thought Content:  Delusions, Ideas of Reference:   Delusions and Paranoid Ideation  Suicidal Thoughts:  No  Homicidal Thoughts:  No  Memory:  Immediate;   Good Recent;   Fair Remote;   Fair  Judgement:  Impaired  Insight:  Shallow  Psychomotor Activity:  Psychomotor Retardation  Concentration:  Fair  Recall:  Poor  Fund of Knowledge:Fair  Language: Good  Akathisia:  No  Handed:  Right  AIMS (if indicated):     Assets:  Financial Resources/Insurance Housing Social Support  ADL's:  Intact  Cognition: WNL  Sleep:      Treatment Plan Summary: Daily contact with patient to assess and evaluate symptoms and progress in treatment, Medication management and Plan Patient has been put back  on her clozapine and has been compliant with medicine. Today she continues to be confused and disorganized in her thinking. At this point we have beds available in the hospital. Patient will be admitted to the hospital with a diagnosis of schizoaffective disorder. Plan discussed with  the patient. Orders completed. Continues to require close monitoring and psychiatric medicine.  Disposition: Recommend psychiatric Inpatient admission when medically cleared. Supportive therapy provided about ongoing stressors.  John Clapacs 12/11/2015 2:32 PM

## 2015-12-11 NOTE — ED Notes (Signed)
Report given to [employer].

## 2015-12-11 NOTE — ED Notes (Addendum)
Pt ambulated to bathroom at this time with assist x 1 at this time, pt tolerated well. No acute distress noted. Pt calm and cooperative at this time

## 2015-12-11 NOTE — ED Notes (Signed)
Pharmacy called regarding Linaclotide, will send to ED.

## 2015-12-11 NOTE — ED Notes (Signed)
Attempted to call report to Behavioral med, RN is taking another admission currently, told to call back in 10 mins.

## 2015-12-11 NOTE — ED Notes (Signed)
Pt unable to provide urine sample, not within ability to reason ATT, recommend reattempt after anti-psychotics take effect

## 2015-12-11 NOTE — Tx Team (Signed)
Initial Interdisciplinary Treatment Plan   PATIENT STRESSORS: Unknown   PATIENT STRENGTHS: General fund of knowledge Supportive family/friends   PROBLEM LIST: Problem List/Patient Goals Date to be addressed Date deferred Reason deferred Estimated date of resolution  Psychosis 12/11/15     Anxiety 12/11/15                                                DISCHARGE CRITERIA:  Improved stabilization in mood, thinking, and/or behavior  PRELIMINARY DISCHARGE PLAN: Outpatient therapy  PATIENT/FAMIILY INVOLVEMENT: This treatment plan has been presented to and reviewed with the patient, Carla Thompson, and/or family member.  The patient and family have been given the opportunity to ask questions and make suggestions.  Nash Mantis [facility] 12/11/2015, 5:49 PM

## 2015-12-11 NOTE — Progress Notes (Signed)
Patient has no needs voiced, no distress verballized. Patient disorganized. Pharmacy went in to speak with patient about her medications. Support and encouragement offered. Safety maintained.

## 2015-12-11 NOTE — BHH Counselor (Addendum)
Patient is to be admitted to [facility] by Dr. Weber Cooks.  Attending Physician will be Dr. Bary Leriche.   Patient has been assigned to room 307, by [place].   Intake Paper Work has been signed and placed on patient chart.  ER staff is aware of the admission [facility] Dr. Corky Downs, ER MD; [facility]).

## 2015-12-11 NOTE — Progress Notes (Signed)
Patient admitted IVC after husband noticed a change in patient's behavior over the past week. Husband reports delusions and paranoid thinking experienced by patient.  Husband reports patient has the episodes every 2-3 years.  On admission patient is confused with disorganized thinking. Patient was reluctant to skin search stating "You're going to poison my blood."  Patient search performed.  No contraband found.

## 2015-12-11 NOTE — BHH Counselor (Signed)
Per Psych MD Dr.Clapacs, Pt is to be admitted to inpatient unit. Inpatient RN Lowell Bouton) has been provided with MRN for Charge RN bed assignment.

## 2015-12-11 NOTE — ED Provider Notes (Signed)
-----------------------------------------   6:09 AM on 12/11/2015 -----------------------------------------   Blood pressure 126/89, pulse 60, temperature 98.9 F (37.2 C), temperature source Oral, resp. rate 18, height 5\' 4"  (1.626 m), weight 137 lb (62.143 kg), last menstrual period 11/27/2015, SpO2 96 %.  The patient had no acute events since last update.  Calm and cooperative at this time.  Disposition is pending per Psychiatry/Behavioral Medicine team recommendations.     Paulette Blanch, MD 12/11/15 [phone]

## 2015-12-11 NOTE — ED Notes (Signed)
Pt changed and cleaned from being incontinent. Pt also noted to be on her menstrual cycle.  Cont to monitor.

## 2015-12-11 NOTE — ED Notes (Signed)
Attempted to call report a second time, RN states pt is "questionable due to incontinence" and will call this RN back momentarily.

## 2015-12-12 ENCOUNTER — Encounter: Payer: Self-pay | Admitting: Psychiatry

## 2015-12-12 ENCOUNTER — Inpatient Hospital Stay: Payer: Managed Care, Other (non HMO)

## 2015-12-12 DIAGNOSIS — F250 Schizoaffective disorder, bipolar type: Principal | ICD-10-CM

## 2015-12-12 LAB — LIPID PANEL
CHOL/HDL RATIO: 2.4 ratio
Cholesterol: 172 mg/dL (ref 0–200)
HDL: 73 mg/dL (ref >40)
LDL Cholesterol: 84 mg/dL (ref 0–99)
TRIGLYCERIDES: 74 mg/dL (ref <150)
VLDL: 15 mg/dL (ref 0–40)

## 2015-12-12 LAB — HEMOGLOBIN A1C: Hgb A1c MFr Bld: 5 % (ref 4.0–6.0)

## 2015-12-12 LAB — TSH: TSH: 2.228 u[IU]/mL (ref 0.350–4.500)

## 2015-12-12 MED ORDER — CLONAZEPAM 0.5 MG PO TABS
0.5000 mg | ORAL_TABLET | Freq: Every day | ORAL | Status: DC
  Administered 2015-12-12 – 2015-12-15 (×4): 0.5 mg via ORAL
  Filled 2015-12-12 (×4): qty 1

## 2015-12-12 MED ORDER — MAGNESIUM CITRATE PO SOLN
1.0000 | Freq: Once | ORAL | Status: AC
  Administered 2015-12-12: 1 via ORAL
  Filled 2015-12-12: qty 296

## 2015-12-12 MED ORDER — DIVALPROEX SODIUM 500 MG PO DR TAB
500.0000 mg | DELAYED_RELEASE_TABLET | Freq: Three times a day (TID) | ORAL | Status: DC
  Administered 2015-12-12 – 2015-12-16 (×12): 500 mg via ORAL
  Filled 2015-12-12 (×13): qty 1

## 2015-12-12 NOTE — BHH Suicide Risk Assessment (Addendum)
[facility] Admission Suicide Risk Assessment   Nursing information obtained from:  Patient Demographic factors:  Caucasian Current Mental Status:  NA Loss Factors:  NA Historical Factors:  NA Risk Reduction Factors:  Sense of responsibility to family, Religious beliefs about death, Positive social support, Living with another person, especially a relative Total Time spent with patient: 1 hour Principal Problem: Schizoaffective disorder, bipolar type ([place]) Diagnosis:   Patient Active Problem List   Diagnosis Date Noted  . Other long term (current) drug therapy [Z79.899] 12/08/2015  . Chronic constipation [K59.00] 12/08/2015  . Insomnia, persistent [G47.00] 12/08/2015  . 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 Gravida 2 para 2 [Z33.1] 12/08/2015  . Blood glucose elevated [R73.9] 12/08/2015  . Hypertriglyceridemia [E78.1] 12/08/2015  . Anemia, iron deficiency [D50.9] 12/08/2015  . Metallic taste 0000000 [id number]  . 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 Cutaneous eruption [R21] 12/08/2015  . Schizoaffective disorder, bipolar type ([place]) [F25.0] 12/08/2015     Continued Clinical Symptoms:  Alcohol Use Disorder Identification Test Final Score (AUDIT): 0 The "Alcohol Use Disorders Identification Test", Guidelines for Use in Primary Care, Second Edition.  World Pharmacologist [facility]). Score between 0-7:  no or low risk or alcohol related problems. Score between 8-15:  moderate risk of alcohol related problems. Score between 16-19:  high risk of alcohol related problems. Score 20 or above:  warrants further diagnostic evaluation for alcohol dependence and treatment.   CLINICAL FACTORS:   Bipolar Disorder:   Mixed State   Musculoskeletal: Strength & Muscle Tone: within normal limits Gait & Station: normal Patient leans: N/A  Psychiatric Specialty Exam: Physical Exam  Nursing note and vitals reviewed. Constitutional: She is oriented to person, place, and time. She  appears well-developed and well-nourished.  HENT:  Head: Normocephalic and atraumatic.  Eyes: Conjunctivae and EOM are normal. Pupils are equal, round, and reactive to light.  Neck: Normal range of motion. Neck supple.  Cardiovascular: Normal rate, regular rhythm and normal heart sounds.   Respiratory: Effort normal and breath sounds normal.  GI: Soft. Bowel sounds are normal.  Musculoskeletal: Normal range of motion.  Neurological: She is alert and oriented to person, place, and time.  Skin: Skin is dry.    Review of Systems  Gastrointestinal: Positive for constipation.  All other systems reviewed and are negative.   Blood pressure 90/59, pulse 111, temperature 97.5 F (36.4 C), temperature source Oral, resp. rate 18, height 5\' 4"  (1.626 m), weight 62.143 kg (137 lb), last menstrual period 12/11/2015, SpO2 99 %.Body mass index is 23.5 kg/(m^2).  General Appearance: Casual  Eye Contact::  Fair  Speech:  Clear and Coherent  Volume:  Decreased  Mood:  Dysphoric  Affect:  Congruent  Thought Process:  Disorganized  Orientation:  Other:  to person and place only  Thought Content:  Delusions and Paranoid Ideation  Suicidal Thoughts:  No  Homicidal Thoughts:  No  Memory:  Immediate;   Fair Recent;   Fair Remote;   Fair  Judgement:  Impaired  Insight:  Shallow  Psychomotor Activity:  Decreased  Concentration:  Fair  Recall:  [place]: Fair  Akathisia:  No  Handed:  Right  AIMS (if indicated):     Assets:  Communication Skills Desire for Improvement Financial Resources/Insurance Housing Physical Health Resilience Social Support  Sleep:  Number of Hours: 6.45  Cognition: WNL  ADL's:  Intact     COGNITIVE FEATURES THAT CONTRIBUTE TO RISK:  None  SUICIDE RISK:   Moderate:  Frequent suicidal ideation with limited intensity, and duration, some specificity in terms of plans, no associated intent, good self-control, limited  dysphoria/symptomatology, some risk factors present, and identifiable protective factors, including available and accessible social support.  PLAN OF CARE: hospital admission, medication management, discharge planning.  Medical Decision Making:  New problem, with additional work up planned, Review of Psycho-Social Stressors (1), Review or order clinical lab tests (1), Review of Medication Regimen & Side Effects (2) and Review of New Medication or Change in Dosage (2)   Carla Thompson is a 55 year old female with history of bipolar disorder admitted for a psychotic break in the context of good medication compliance.  1. Psychosis. We will continue Clozaril. We will add Depakote for mood stabilization that was helpful in the past.  2. Anxiety. The patient takes 0.5 mg of clonazepam at bedtime for anxiety.  3. Constipation. She is on Linzess. We'll offer Mag citrate.  4. Insomnia. She responded well to trazodone.  5. Disposition. She will be discharged to home with the family. She will follow up with her regular psychiatrist.  I certify that inpatient services furnished can reasonably be expected to improve the patient's condition.   [name] 12/12/2015, 5:36 PM

## 2015-12-12 NOTE — H&P (Signed)
Psychiatric Admission Assessment Adult  Patient Identification: SANTI BENITZ MRN:  [id number] Date of Evaluation:  12/12/2015 Chief Complaint:  Schizophrenia disorder , bipolar type Principal Diagnosis: Schizoaffective disorder, bipolar type ([place]) Diagnosis:   Patient Active Problem List   Diagnosis Date Noted  . Other long term (current) drug therapy [Z79.899] 12/08/2015  . Chronic constipation [K59.00] 12/08/2015  . Insomnia, persistent [G47.00] 12/08/2015  . 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 Gravida 2 para 2 [Z33.1] 12/08/2015  . Blood glucose elevated [R73.9] 12/08/2015  . Hypertriglyceridemia [E78.1] 12/08/2015  . Anemia, iron deficiency [D50.9] 12/08/2015  . Metallic taste 0000000 [id number]  . 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 Cutaneous eruption [R21] 12/08/2015  . Schizoaffective disorder, bipolar type ([place]) [F25.0] 12/08/2015   History of Present Illness:  Identifying data. Ms. Roache is a 55 year old female with history of bipolar disorder  Chief complaint. "I think I am dead."  History of present illness.Information was obtained from the patient and the chart. The patient has a long history of bipolar illness. She has been maintained on clozapine with excellent results. Every 2 years however she seems to have a manic episode with psychosis, thought disorganization and bizarre behaviors. His last hospitalization aliments [facility] was in 2014 for similar presentation. At the patient was brought to the hospital by her husband reported that the patient has been sleeping poorly and behaving strangely. She is religiously preoccupied. She is disorganized and unable to keep up with the housework and medications. In the hospital she is extremely somatic. She feels that she is dead or maybe dead soon. She complains about his skin feeling that the veins are going to disappear. She has not had a bowel movement and believes that this needs to be  taken care off immediately or she would die. She was examined in the emergency room and no impaction was found. She is extremely suspicious of use in staff on the unit. It takes a lot of encouragement to give her medications. She seems very confused about the circumstances of her admission. She has a very hard time naming her medications. She denies auditory or visual hallucinations. She feels very anxious about her surroundings and would like to return home as soon as possible. There is no involvement with drugs or alcohol.  Past psychiatric history. Long history of bipolar disorder with multiple psychiatric hospitalizations over 10 of them. She was tried on multiple medications but Clozaril works well for her. Her husband reports good medication compliance. There were no suicide attempts.  Family psychiatric history. Nonreported.  Social history. The patient is married and lives with her husband. 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 She has 2 daughters ages 52 and 53 who are now independent. She enjoys reading.  Total Time spent with patient: 1 hour  Past Psychiatric History: bipolar disorder.  Risk to Self: Is patient at risk for suicide?: No Risk to Others:   Prior Inpatient Therapy:   Prior Outpatient Therapy:    Alcohol Screening: 1. How often do you have a drink containing alcohol?: Never 9. Have you or someone else been injured as a result of your drinking?: No 10. Has a relative or friend or a doctor or another health worker been concerned about your drinking or suggested you cut down?: No Alcohol Use Disorder Identification Test Final Score (AUDIT): 0 Brief Intervention: AUDIT score less than 7 or less-screening does not suggest unhealthy drinking-brief intervention not indicated Substance Abuse History in the last 12 months:  No.  Consequences of Substance Abuse: NA Previous Psychotropic Medications: Yes  Psychological Evaluations: No  Past Medical History:  Past Medical History  Diagnosis  Date  . Dermatitis   . Bowel incontinence   . Hypertriglyceridemia   . Chronic constipation   . High risk medication use   . Abnormal perimenopausal bleeding   . Hyperglycemia   . Metallic taste   . Schizoaffective disorder, bipolar type (H[facility])     Dr. Tyrone Sage  . 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 Tubal ligation  12/02/1994  . Anus surgery  2000    [place]   Family History:  Family History  Problem Relation Age of Onset  . Mental illness Sister     Schizophrenia and bipolar  . Cancer Maternal Grandmother     Colon  . Mental illness Paternal Grandmother     Schizophrenia   Family Psychiatric  History: not reported. Social History:  History  Alcohol Use No     History  Drug Use No    Social History   Social History  . Marital Status: Married    Spouse Name: N/A  . Number of Children: N/A  . Years of Education: N/A   Social History Main Topics  . Smoking status: Never Smoker   . Smokeless tobacco: None  . Alcohol Use: No  . Drug Use: No  . Sexual Activity: Not Currently   Other Topics Concern  . None   Social History Narrative   Additional Social History:                         Allergies:   Allergies  Allergen Reactions  . Escitalopram Other (See Comments)    Reaction:  Unknown   . Levsin [Hyoscyamine Sulfate] Other (See Comments)    Reaction:  Unknown    Lab Results:  Results for orders placed or performed during the hospital encounter of 12/11/15 (from the past 48 hour(s))  Hemoglobin A1c     Status: None   Collection Time: 12/12/15  6:37 AM  Result Value Ref Range   Hgb A1c MFr Bld 5.0 4.0 - 6.0 %  Lipid panel, fasting     Status: None   Collection Time: 12/12/15  6:37 AM  Result Value Ref Range   Cholesterol 172 0 - 200 mg/dL   Triglycerides 74 <150 mg/dL   HDL 73 >40 mg/dL   Total CHOL/HDL Ratio 2.4 RATIO   VLDL 15  0 - 40 mg/dL   LDL Cholesterol 84 0 - 99 mg/dL    Comment:        Total Cholesterol/HDL:CHD Risk Coronary Heart Disease Risk Table                     Men   Women  1/2 Average Risk   3.4   3.3  Average Risk       5.0   4.4  2 X Average Risk   9.6   7.1  3 X Average Risk  23.4   11.0        Use the calculated Patient Ratio above and the CHD Risk Table to determine the patient's CHD Risk.        ATP III CLASSIFICATION (LDL):  <100     mg/dL   Optimal  100-129  mg/dL   Near or Above  Optimal  130-159  mg/dL   Borderline  160-189  mg/dL   High  >190     mg/dL   Very High   TSH     Status: None   Collection Time: 12/12/15  6:37 AM  Result Value Ref Range   TSH 2.228 0.350 - 4.500 uIU/mL    Metabolic Disorder Labs:  Lab Results  Component Value Date   HGBA1C 5.0 12/12/2015   No results found for: PROLACTIN Lab Results  Component Value Date   CHOL 172 12/12/2015   TRIG 74 12/12/2015   HDL 73 12/12/2015   CHOLHDL 2.4 12/12/2015   VLDL 15 12/12/2015   LDLCALC 84 12/12/2015   LDLCALC 86 12/11/2015    Current Medications: Current Facility-Administered Medications  Medication Dose Route Frequency Provider Last Rate Last Dose  . acetaminophen (TYLENOL) tablet 650 mg  650 mg Oral Q6H PRN Gonzella Lex, MD      . alum & mag hydroxide-simeth (MAALOX/MYLANTA) 200-200-20 MG/5ML suspension 30 mL  30 mL Oral Q4H PRN Gonzella Lex, MD      . clonazePAM (KLONOPIN) tablet 0.5 mg  0.5 mg Oral QHS Slaton Reaser B Lorenzo Pereyra, MD      . cloZAPine (CLOZARIL) tablet 350 mg  350 mg Oral QHS Gonzella Lex, MD   350 mg at 12/11/15 2138  . divalproex (DEPAKOTE) DR tablet 500 mg  500 mg Oral 3 times per day Clovis Fredrickson, MD      . Linaclotide (LINZESS) capsule 145 mcg  145 mcg Oral Daily Gonzella Lex, MD   145 mcg at 12/12/15 1001  . magnesium citrate solution 1 Bottle  1 Bottle Oral Once Cornesha Radziewicz B Lorilee Cafarella, MD      . magnesium hydroxide (MILK OF MAGNESIA) suspension  30 mL  30 mL Oral Daily PRN Gonzella Lex, MD      . traZODone (DESYREL) tablet 100 mg  100 mg Oral QHS Gonzella Lex, MD   100 mg at 12/11/15 2139   PTA Medications: Prescriptions prior to admission  Medication Sig Dispense Refill Last Dose  . B Complex-C (SUPER B COMPLEX PO) Take 1 tablet by mouth daily.   unknown at unknown  . clonazePAM (KLONOPIN) 0.5 MG tablet Take 0.5 mg by mouth at bedtime.    unknown at unknown  . cloZAPine (CLOZARIL) 100 MG tablet Take 100-300 mg by mouth 2 (two) times daily. Pt takes one tablet in the morning and three tablets at bedtime.   unknown at unknown  . docusate sodium (STOOL SOFTENER) 100 MG capsule Take 100 mg by mouth 2 (two) times daily as needed for mild constipation.    PRN at PRN  . Linaclotide (LINZESS) 145 MCG CAPS capsule Take 1 capsule (145 mcg total) by mouth daily. 30 capsule 2 unknown at unknown  . Magnesium Gluconate 250 MG TABS Take 250 mg by mouth daily.    unknown at unknown  . Melatonin 5 MG TABS Take 5 mg by mouth at bedtime as needed (for sleep).   PRN at PRN  . ranitidine (ZANTAC) 150 MG tablet Take 1 tablet (150 mg total) by mouth 2 (two) times daily. 60 tablet 3 unknown at unknown  . vitamin C (ASCORBIC ACID) 500 MG tablet Take 500 mg by mouth daily.   unknown at unknown  . vitamin E 1000 UNIT capsule Take 1,000 Units by mouth daily.    unknown at unknown    Musculoskeletal: Strength & Muscle Tone: within normal limits  Gait & Station: normal Patient leans: N/A  Psychiatric Specialty Exam: Physical Exam  Nursing note and vitals reviewed.   Review of Systems  Gastrointestinal: Positive for constipation.  All other systems reviewed and are negative.   Blood pressure 90/59, pulse 111, temperature 97.5 F (36.4 C), temperature source Oral, resp. rate 18, height 5\' 4"  (1.626 m), weight 62.143 kg (137 lb), last menstrual period 12/11/2015, SpO2 99 %.Body mass index is 23.5 kg/(m^2).  See SRA.                                                   Sleep:  Number of Hours: 6.45     Treatment Plan Summary: Daily contact with patient to assess and evaluate symptoms and progress in treatment and Medication management   Ms. Montufar is a 55 year old female with history of bipolar disorder admitted for a psychotic break in the context of good medication compliance.  1. Psychosis. We will continue Clozaril. We will add Depakote for mood stabilization that was helpful in the past.  2. Anxiety. The patient takes 0.5 mg of clonazepam at bedtime for anxiety.  3. Constipation. She is on Linzess. We'll offer Mag citrate.  4. Insomnia. She responded well to trazodone.  5. Metabolic syndrome.lipid profile, hemoglobin A1c and TSH are normal.  6. Disposition. She will be discharged to home with the family. She will follow up with her regular psychiatrist.  Observation Level/Precautions:  15 minute checks  Laboratory:  CBC Chemistry Profile UDS UA  Psychotherapy:    Medications:    Consultations:    Discharge Concerns:    Estimated LOS:  Other:     I certify that inpatient services furnished can reasonably be expected to improve the patient's condition.   Dhyana Bastone 1/10/20175:41 PM

## 2015-12-12 NOTE — Progress Notes (Signed)
Recreation Therapy Notes  Date: 01.10.17 Time: 3:00 pm Location: Craft Room  Group Topic: Goal Setting  Goal Area(s) Addresses:  Patient will write down one goal. Patient will write at least one encouraging statement.  Behavioral Response: Attentive  Intervention: Step By Step  Activity: Patients were given a foot and instructed to write a goal towards their recovery on the inside of the foot and write positive statements on the outside.  Education: LRT educated patients on ways they can stay focused on their goals.  Education Outcome: In group clarification offered  Clinical Observations/Feedback: Patient completed activity by writing her goal and positive statement with assistance from LRT. Patient did not contribute to group discussion.  Leonette Monarch, LRT/CTRS 12/12/2015 4:18 PM

## 2015-12-12 NOTE — Progress Notes (Signed)
MEDICATION RELATED CONSULT NOTE -Follow up  Pharmacy Consult for Clozapine Indication: anti-psychotic  Allergies  Allergen Reactions  . Escitalopram Other (See Comments)    Reaction:  Unknown   . Levsin [Hyoscyamine Sulfate] Other (See Comments)    Reaction:  Unknown     Patient Measurements: Height: 5\' 4"  (162.6 cm) Weight: 137 lb (62.143 kg) IBW/kg (Calculated) : 54.7 Adjusted Body Weight: na  Vital Signs: Temp: 97.5 F (36.4 C) (01/10 0700) BP: 90/59 mmHg (01/10 0700) Pulse Rate: 111 (01/10 0700) Intake/Output from previous day:   Intake/Output from this shift:    Labs:  Recent Labs  12/09/15 1341  WBC 10.1  HGB 13.8  HCT 41.9  PLT 339  CREATININE 0.89  ALBUMIN 4.7  PROT 8.0  AST 15  ALT 17  ALKPHOS 81  BILITOT 0.6   1/7  ANC= 7.9  Estimated Creatinine Clearance: 63.1 mL/min (by C-G formula based on Cr of 0.89).   Microbiology: No results found for this or any previous visit (from the past 720 hour(s)).  Medical History: Past Medical History  Diagnosis Date  . Dermatitis   . Bowel incontinence   . Hypertriglyceridemia   . Chronic constipation   . High risk medication use   . Abnormal perimenopausal bleeding   . Hyperglycemia   . Metallic taste   . Schizoaffective disorder, bipolar type (HCC)     Dr. Tyrone Sage  . 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 cloZAPine  100 mg Oral Daily  . cloZAPine  350 mg Oral QHS  . Linaclotide  145 mcg Oral Daily  . LORazepam  1 mg Oral TID  . traZODone  100 mg Oral QHS    Assessment: Patient admitted for altered mental status. Was taking Clozepine prior to admission. Checked clozepine registry and submitted lab work.  Patient currently on Clozaril 100 mg qam and 350mg  at bedtime  Plan:  12/09/15  ANC=7.9    Okay to dispense clozapine. Patient for weekly lab draws, next due 12/16/15.  Chinita Greenland PharmD Clinical  Pharmacist 12/12/2015  8:19 AM

## 2015-12-12 NOTE — Progress Notes (Signed)
Pt took all medications with some coaxing. Pt has bizarre behavior, thinking that the scanner was going to hurt her. RN explained to pt that the scanner was just a light and would not hurt her at all. Pt allowed herself to be scanner and took her medications after RN encouraged her.

## 2015-12-12 NOTE — BHH Group Notes (Signed)
[facility] Group Notes:  (Nursing/MHT/Case Management/Adjunct)  Date:  12/12/2015  Time:  4:56 PM  Type of Therapy:  Psychoeducational Skills  Participation Level:  Active  Participation Quality:  Appropriate, Attentive and Sharing  Affect:  Appropriate  Cognitive:  Alert and Appropriate  Insight:  Appropriate  Engagement in Group:  Engaged  Modes of Intervention:  Discussion, Education and Support  Summary of Progress/Problems:  Carla Thompson [facility] 12/12/2015, 4:56 PM

## 2015-12-12 NOTE — Plan of Care (Signed)
Problem: Ineffective individual coping Goal: LTG: Patient will report a decrease in negative feelings Outcome: Progressing Working on Radiographer, therapeutic

## 2015-12-12 NOTE — Progress Notes (Signed)
D: Disorganized , unable to put her clothes on needing frequent redirection. Very needy with Probation officer . Isolated from peers early in am , but later in shift patient out of room working on Tuesday's workbook . Patient having her meneses  A: Encourage patient participation with unit programming . Instruction  Given on  Medication , verbalize understanding. R: Voice no other concerns. Staff continue to monitor

## 2015-12-12 NOTE — BHH Group Notes (Signed)
[facility] LCSW Group Therapy  12/12/2015 5:43 PM  Type of Therapy:  Group Therapy  Participation Level:  Minimal  Participation Quality:  Pt disorganized making it difficult for her to process  Affect:  Anxious and Flat  Cognitive:  Disorganized  Insight:  Lacking  Engagement in Therapy:  Poor  Modes of Intervention:  Education, Exploration and Socialization  Summary of Progress/Problems:Pt present for group discussion about family understanding and conflict around mental health diagnosis.  Her cognition limits her ability to participate  August Saucer, MSW, LCSW 12/12/2015, 5:43 PM

## 2015-12-12 NOTE — Consult Note (Signed)
  Contacted by nursing and notified of a fall. Patient received nighttime medications at 9:00Pm including clonazepam and Clozaril, and was found on floor sitting upright at approximate 10:50PM. Per nursing patient is alert and oriented but drowsy. Vitals are norm except for elevated pulse at 123. Patient is unclear if she hit her head, Per nursing only complaint is generalized weakness. No obvious trauma per nurse. Will order head CT and fall precautions.

## 2015-12-13 LAB — GLUCOSE, CAPILLARY: Glucose-Capillary: 97 mg/dL (ref 65–99)

## 2015-12-13 NOTE — Progress Notes (Signed)
[facility] MD Progress Note  12/13/2015 3:45 PM HARRIS MCQUARY  MRN:  [id number]  Subjective:  Carla Thompson is disorganized and preoccupied with somatic complaints. She refused to have feels today as she is worried about her stomach. She did have good bowel movement. She was incontinent of stool. It is quite possible that she was given massive doses of laxatives, there the patient that she reported yesterday. She is worried about her teeth as well. She fell last night. Head CT scan negative. She has a Actuary. She complains that were giving her too much medications except for the addition of Depakote she is on her home regimen. She was not able to participate in programming. She is suspicious of medications we gave her.  Principal Problem: Schizoaffective disorder, bipolar type ([place]) Diagnosis:   Patient Active Problem List   Diagnosis Date Noted  . Other long term (current) drug therapy [Z79.899] 12/08/2015  . Chronic constipation [K59.00] 12/08/2015  . Insomnia, persistent [G47.00] 12/08/2015  . 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 Gravida 2 para 2 [Z33.1] 12/08/2015  . Blood glucose elevated [R73.9] 12/08/2015  . Hypertriglyceridemia [E78.1] 12/08/2015  . Anemia, iron deficiency [D50.9] 12/08/2015  . Metallic taste [id number]  . 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 Cutaneous eruption [R21] 12/08/2015  . Schizoaffective disorder, bipolar type ([place]) [F25.0] 12/08/2015   Total Time spent with patient: 15 minutes  Past Psychiatric History: bipolar disorder.  Past Medical History:  Past Medical History  Diagnosis Date  . Dermatitis   . Bowel incontinence   . Hypertriglyceridemia   . Chronic constipation   . High risk medication use   . Abnormal perimenopausal bleeding   . Hyperglycemia   . Metallic taste   . Schizoaffective disorder, bipolar type (HCC)     Dr. Tyrone Sage  . 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 Tubal ligation  12/02/1994  . Anus surgery  2000    [place]   Family History:  Family History  Problem Relation Age of Onset  . Mental illness Sister     Schizophrenia and bipolar  . Cancer Maternal Grandmother     Colon  . Mental illness Paternal Grandmother     Schizophrenia   Family Psychiatric  History: none reported. Social History:  History  Alcohol Use No     History  Drug Use No    Social History   Social History  . Marital Status: Married    Spouse Name: N/A  . Number of Children: N/A  . Years of Education: N/A   Social History Main Topics  . Smoking status: Never Smoker   . Smokeless tobacco: None  . Alcohol Use: No  . Drug Use: No  . Sexual Activity: Not Currently   Other Topics Concern  . None   Social History Narrative   Additional Social History:                         Sleep: Fair  Appetite:  Poor  Current Medications: Current Facility-Administered Medications  Medication Dose Route Frequency Provider Last Rate Last Dose  . acetaminophen (TYLENOL) tablet 650 mg  650 mg Oral Q6H PRN Gonzella Lex, MD      . alum & mag hydroxide-simeth (MAALOX/MYLANTA) 200-200-20 MG/5ML suspension 30 mL  30 mL Oral Q4H PRN Gonzella Lex, MD      .  clonazePAM (KLONOPIN) tablet 0.5 mg  0.5 mg Oral QHS Monifa Blanchette B Delita Chiquito, MD   0.5 mg at 12/12/15 2111  . cloZAPine (CLOZARIL) tablet 350 mg  350 mg Oral QHS Gonzella Lex, MD   350 mg at 12/12/15 2110  . divalproex (DEPAKOTE) DR tablet 500 mg  500 mg Oral 3 times per day Clovis Fredrickson, MD   500 mg at 12/13/15 1525  . Linaclotide (LINZESS) capsule 145 mcg  145 mcg Oral Daily Gonzella Lex, MD   145 mcg at 12/13/15 0907  . magnesium hydroxide (MILK OF MAGNESIA) suspension 30 mL  30 mL Oral Daily PRN Gonzella Lex, MD      . traZODone (DESYREL) tablet 100 mg  100 mg Oral QHS Gonzella Lex, MD   100 mg at 12/12/15 2111    Lab Results:   Results for orders placed or performed during the hospital encounter of 12/11/15 (from the past 48 hour(s))  Hemoglobin A1c     Status: None   Collection Time: 12/12/15  6:37 AM  Result Value Ref Range   Hgb A1c MFr Bld 5.0 4.0 - 6.0 %  Lipid panel, fasting     Status: None   Collection Time: 12/12/15  6:37 AM  Result Value Ref Range   Cholesterol 172 0 - 200 mg/dL   Triglycerides 74 <150 mg/dL   HDL 73 >40 mg/dL   Total CHOL/HDL Ratio 2.4 RATIO   VLDL 15 0 - 40 mg/dL   LDL Cholesterol 84 0 - 99 mg/dL    Comment:        Total Cholesterol/HDL:CHD Risk Coronary Heart Disease Risk Table                     Men   Women  1/2 Average Risk   3.4   3.3  Average Risk       5.0   4.4  2 X Average Risk   9.6   7.1  3 X Average Risk  23.4   11.0        Use the calculated Patient Ratio above and the CHD Risk Table to determine the patient's CHD Risk.        ATP III CLASSIFICATION (LDL):  <100     mg/dL   Optimal  100-129  mg/dL   Near or Above                    Optimal  130-159  mg/dL   Borderline  160-189  mg/dL   High  >190     mg/dL   Very High   TSH     Status: None   Collection Time: 12/12/15  6:37 AM  Result Value Ref Range   TSH 2.228 0.350 - 4.500 uIU/mL  Glucose, capillary     Status: None   Collection Time: 12/13/15 12:37 AM  Result Value Ref Range   Glucose-Capillary 97 65 - 99 mg/dL    Physical Findings: AIMS: Facial and Oral Movements Muscles of Facial Expression: None, normal Lips and Perioral Area: None, normal Jaw: None, normal Tongue: None, normal,Extremity Movements Upper (arms, wrists, hands, fingers): None, normal Lower (legs, knees, ankles, toes): None, normal, Trunk Movements Neck, shoulders, hips: None, normal, Overall Severity Severity of abnormal movements (highest score from questions above): None, normal Incapacitation due to abnormal movements: None, normal Patient's awareness of abnormal movements (rate only patient's report): No Awareness,  Dental Status Current problems with teeth and/or dentures?:  No Does patient usually wear dentures?: No  CIWA:    COWS:     Musculoskeletal: Strength & Muscle Tone: within normal limits Gait & Station: normal Patient leans: N/A  Psychiatric Specialty Exam: Review of Systems  Gastrointestinal: Positive for diarrhea and constipation.  Musculoskeletal: Positive for falls.  All other systems reviewed and are negative.   Blood pressure 109/73, pulse 120, temperature 97.7 F (36.5 C), temperature source Oral, resp. rate 20, height 5\' 4"  (1.626 m), weight 62.143 kg (137 lb), last menstrual period 12/11/2015, SpO2 99 %.Body mass index is 23.5 kg/(m^2).  General Appearance: Fairly Groomed  Engineer, water::  Fair  Speech:  Clear and Coherent  Volume:  Normal  Mood:  Anxious  Affect:  Labile  Thought Process:  Disorganized  Orientation:  Full (Time, Place, and Person)  Thought Content:  WDL  Suicidal Thoughts:  No  Homicidal Thoughts:  No  Memory:  Immediate;   Fair Recent;   Fair Remote;   Fair  Judgement:  Impaired  Insight:  Present  Psychomotor Activity:  Decreased  Concentration:  Fair  Recall:  [employer] of Knowledge:Fair  Language: Fair  Akathisia:  No  Handed:  Right  AIMS (if indicated):     Assets:  Communication Skills Desire for Improvement Financial Resources/Insurance Housing Resilience Social Support  ADL's:  Intact  Cognition: WNL  Sleep:  Number of Hours: 6.75   Treatment Plan Summary: Daily contact with patient to assess and evaluate symptoms and progress in treatment and Medication management   Ms. Knapper is a 55 year old female with history of bipolar disorder admitted for a psychotic break in the context of good medication compliance.  1. Psychosis. We will continue Clozaril. We will add Depakote for mood stabilization that was helpful in the past.  2. Anxiety. The patient takes 0.5 mg of clonazepam at bedtime for anxiety.  3. Constipation. She is  on Linzess. We'll offer Mag citrate.  4. Insomnia. She responded well to trazodone.  5. Metabolic syndrome.lipid profile, hemoglobin A1c and TSH are normal.  6. 4. Head CT scan was negative  7. Disposition. She will be discharged to home with the family. She will follow up with her regular psychiatrist.  No medication changes were offered today 12/13/2015    Debborah Alonge 12/13/2015, 3:45 PM
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
## 2015-12-13 NOTE — BHH Group Notes (Signed)
[facility] LCSW Group Therapy  12/13/2015 5:26 PM  Type of Therapy:  Group Therapy  Participation Level:  Did Not Attend  Modes of Intervention:  Discussion, Education, Socialization and Support  Summary of Progress/Problems: Emotional Regulation: Patients will identify both negative and positive emotions. They will discuss emotions they have difficulty regulating and how they impact their lives. Patients will be asked to identify healthy coping skills to combat unhealthy reactions to negative emotions.     [place] MSW, [place]  12/13/2015, 5:26 PM

## 2015-12-13 NOTE — Plan of Care (Signed)
Problem: Ineffective individual coping Goal: STG-Increase in ability to manage activities of daily living Outcome: Progressing Pt more alert, able to provide self care. No incontinence episode

## 2015-12-13 NOTE — Progress Notes (Signed)
D: No apparent injuries post fall. Pt able to move all extremities. More alert this shift. Continues with loose stools due to mag citrate given. Continues with 1:1 sitter for safety. Neuro checks completed. No issues noted.  A: Encouragement and support provided, medications give as per MD order. Pt is med compliant. Refused to eat lunch due to bathroom and loose stools.  R: Pt receptive. Remains safe on unit.Will continue to assess for safety

## 2015-12-13 NOTE — Progress Notes (Addendum)
D: Observed pt in day room talking to husband. Patient alert and oriented x4. Patient denies SI/HI/AVH. Pt affect is anxious. Pt mildly confused at times, but able to be easily reoriented. Pt preoccupied with bruise on arm, bowel movements, and teeth being effected by juice/milk. Pt c/o of diarrhea prior to admission. Pt rates depression 4/10 and anxiety 4/10, but patient stated "I would never want to kill myself." Pt c/o of generalized weakness but is ambulating appropriately. Pt husband had questions about medication, and pt verbally agreed to allow RN to discuss pt's information while pt was present.  A: Offered active listening and support. Provided therapeutic communication. Administered scheduled medications. Re-oriented pt when needed. Discussed current medications with husband  R: Pt pleasant and cooperative. Pt med compliant. Pt voicing no other complain Will continue Q15 min. Checks. Safety maintained.

## 2015-12-13 NOTE — BHH Group Notes (Signed)
[facility] Group Notes:  (Nursing/MHT/Case Management/Adjunct)  Date:  12/13/2015  Time:  10:39 PM  Type of Therapy:  Evening Wrap-up Group  Participation Level:  Did Not Attend  Participation Quality:  N/A  Affect:  N/A  Cognitive:  N/A  Insight:  None  Engagement in Group:  N/A  Modes of Intervention:  Discussion  Summary of Progress/Problems:  Levonne Spiller 12/13/2015, 10:39 PM

## 2015-12-13 NOTE — Progress Notes (Signed)
D: Pt was found sitting on ground in bathroom by Probation officer at [employer]. Pt was drowsy, but pt was given Klonopin .5mg  and Trazodone 100mg . Pt was mildly confused, but no different than prior to fall. Pt was alert and oriented x4. Pt said she was getting off from the toilet when she slipped.  Pt indicated she might have hit her head, but said she was not in pain. No sign of injury on pt. Neuro assessment found no abnormalities, other than drowsiness. Pt  A: Vitals were taken. Neuro assessment done. Pt was assisted back to bed. Pt placed on high fall risk. Called MD, nurse supervisor, and patient's husband [name]. CT performed per MD order. Neuro assessments performed Q2 hours per MD order. 1:1 sitter placed for fall safety. Pt re-oriented and re-educated on using call light.  R: Further neuro checks revealed no abnormalities. Pulse rate remained slighly elevated, but other vital signs within normal range. Pt cooperative. 1:1 sitter maintained for fall safety.

## 2015-12-13 NOTE — Plan of Care (Signed)
Problem: Alteration in thought process Goal: LTG-Patient verbalizes understanding importance med regimen (Patient verbalizes understanding of importance of medication regimen and need to continue outpatient care.)  Outcome: Progressing Pt compliant with medications.

## 2015-12-13 NOTE — BHH Group Notes (Signed)
[facility] LCSW Aftercare Discharge Planning Group Note   12/13/2015 5:19 PM  Participation Quality:  Active   Mood/Affect:  Flat  Depression Rating:  8  Anxiety Rating: 8   Thoughts of Suicide:  No Will you contract for safety?   NA  Current AVH:  No  Plan for Discharge/Comments:  Pt plans to return home and follow up with outpatient.  When asked a question, she had difficulties responding appropriately. She receives outpatient services at [facility] and lives with her husband.   Transportation Means: Family   Supports: family   [place] MSW, [place]

## 2015-12-13 NOTE — Progress Notes (Signed)
Recreation Therapy Notes  Date: 01.11.17 Time: 3:00 pm Location: Craft Room  Group Topic: Self-esteem  Goal Area(s) Addresses:  Patient will write at least one positive trait. Patient will verbalize benefit of having a healthy self-esteem.  Behavioral Response: Did not attend  Intervention: I Am  Activity: Patients were given a worksheet with the letter I and instructed to write as many positive traits inside the letter as they could.  Education: LRT educated patients on ways they can increase their self-esteem.  Education Outcome: Patient did not attend group.   Clinical Observations/Feedback: Patient did not attend group.  Leonette Monarch, LRT/CTRS 12/13/2015 4:39 PM

## 2015-12-14 NOTE — Progress Notes (Signed)
Continues with 1:1 sitter for safety. Pt more aware of surroundings today. Continues with somatic complaints. Eating meals in dayroom. Interacting with staff and peers appropriately. Denies SI, HI, AVH. Physical therapy in to assess and treat. Pt with walker. Encouraged pt to attend group. Pt receptive. Remains safe on unit.

## 2015-12-14 NOTE — Plan of Care (Signed)
Problem: Consults Goal: Anxiety Disorder Patient Education See Patient Education Module for eduction specifics.  Outcome: Not Met (add Reason) Patient remains anxious. Flat sad affect. Med compliant. S/e and adverse reaction discussed. Questions encouraged. No PRN given. Pt remains on 1:1 for safety. Unsteady gait. Pt remains free from harm.

## 2015-12-14 NOTE — Evaluation (Signed)
Physical Therapy Evaluation Patient Details Name: Carla Thompson MRN: [id number] DOB: [birth year] Today's Date: 12/14/2015   History of Present Illness  Carla Thompson is a 55 y.o. female with history of schizoaffective disorder bipolar type. Presents to the emergency department because family concerns for abnormal behavior. The patient herself will not answer my questions. Per triage note the patient has not been sleeping for a couple of days. The patient herself just repeats help me. She would nod or could not say why she was in the emergency department today. She did not answer in the affirmative to any questions about recent illnesses, chest pain, shortness of breath, nausea vomiting or diarrhea.  Clinical Impression  Pt is very tangental in conversation and distracted throughout evaluation. She demonstrates fair transfers and safety with balance/mobility. Pt does have higher level balance deficits as well as poor insight to safety/judgement. She reports her husband works full time and she would be alone upon returning home. Pt would benefit from OP PT but it is unclear if she would be compliant with therapy or participate appropriately. She does have a history of at least 3 falls over the last 12 months and has fallen since admission at [facility]. Pt will need to use a walker upon discharge. Pt will benefit from skilled PT services to address deficits in strength, balance, and mobility in order to return to full function at home.     Follow Up Recommendations Outpatient PT;Supervision - Intermittent (Poor awareness/judgement, Hx of falls, med alert bracelet?)    Equipment Recommendations  Rolling walker with 5" wheels    Recommendations for Other Services       Precautions / Restrictions Precautions Precautions: Fall Restrictions Weight Bearing Restrictions: No      Mobility  Bed Mobility               General bed mobility comments: Pt received from group, bed mobility not  assessed  Transfers Overall transfer level: Needs assistance Equipment used: None Transfers: Sit to/from Stand Sit to Stand: Min guard         General transfer comment: Pt demonstrates reasonable LE strength with sit to stand from low seat. Good stability noted without evidence for imbalance with transfers. She does have slight decrease in power requiring small increase in time to come to full standing  Ambulation/Gait Ambulation/Gait assistance: Min guard Ambulation Distance (Feet): 250 Feet Assistive device: Rolling walker (2 wheeled) (as well as no assistive device) Gait Pattern/deviations: Step-through pattern Gait velocity: Decreased Gait velocity interpretation: Below normal speed for age/gender General Gait Details: Pt demonstrates mild decrease in gait speed although functional for full household and limited community mobility. Pt initially ambulates with rolling walker without evidence for instability. Progressed to no assistive device. Pt able to complete horizontal and vertical head turns without overt LOB but does have min to mod lateral gait deviations as well as slowing of speed. Turn in <3 seconds. Pt encouraged to use rolling walker due to history of 3 falls over the last 12 months as well as fall since admission  Stairs            Wheelchair Mobility    Modified Rankin (Stroke Patients Only)       Balance Overall balance assessment: Needs assistance   Sitting balance-Leahy Scale: Good       Standing balance-Leahy Scale: Fair Standing balance comment: Positive Rhomberg 8-10 seconds. Single leg balance <2 seconds bilateral  Pertinent Vitals/Pain Pain Assessment: 0-10 Pain Score: 10-Worst pain ever Pain Location: "Stomach pain" Pain Intervention(s): Monitored during session    [place] expects to be discharged to:: Private residence Living Arrangements: Spouse/significant other Available  Help at Discharge: Family (No 24/7 assist) Type of Home: House Home Access: Stairs to enter Entrance Stairs-Rails:  (Unable to answer about rails outside) [employer] of Steps: "I don't know, a few to enter." Home Layout: Multi-level Home Equipment: None (No equipment, no grab bars in bathroom) Additional Comments: Pt reports no bathroom on main level. States she will have to go upstairs to go to the bathroom. Pt reports that her husband works full time and she would be home alone    Prior Function Level of Independence: Independent         Comments: Previously independent with ADLs/IADLs without asssitive device     Hand Dominance   Dominant Hand: Right    Extremity/Trunk Assessment   Upper Extremity Assessment: Overall WFL for tasks assessed           Lower Extremity Assessment: Overall WFL for tasks assessed (Poor command follow and poor understanding of commands)         Communication   Communication: No difficulties  Cognition Arousal/Alertness: Awake/alert Behavior During Therapy: Restless (Thoughts are tangental, pt easily distracted) Overall Cognitive Status: No family/caregiver present to determine baseline cognitive functioning (AOx4)       Memory: Decreased short-term memory (Difficulty with details prior to admission. Distracted)              General Comments      Exercises        Assessment/Plan    PT Assessment Patient needs continued PT services  PT Diagnosis Difficulty walking;Abnormality of gait;Generalized weakness   PT Problem List Decreased strength;Decreased balance;Decreased mobility;Decreased cognition;Decreased knowledge of use of DME;Decreased safety awareness;Decreased knowledge of precautions  PT Treatment Interventions DME instruction;Gait training;Stair training;Functional mobility training;Therapeutic activities;Therapeutic exercise;Balance training;Neuromuscular re-education;Cognitive remediation;Patient/family  education   PT Goals (Current goals can be found in the Care Plan section) Acute Rehab PT Goals Patient Stated Goal: "I don't know how I'm going to get around at home." Pt struggles with goal setting but appears to want to improve her function at home PT Goal Formulation: With patient Time For Goal Achievement: 12/28/15 Potential to Achieve Goals: Fair    Frequency Min 2X/week   Barriers to discharge Decreased caregiver support Husband works full time    Building control surveyor of Session Equipment Utilized During Treatment: Gait belt Activity Tolerance: Patient tolerated treatment well Patient left: in chair;with nursing/sitter in room (Pt returned to group therapy at end of evaluation) Nurse Communication: Mobility status    Functional Assessment Tool Used: clinical judgement, single leg balance Functional Limitation: Mobility: Walking and moving around Mobility: Walking and Moving Around Current Status [phone]): At least 20 percent but less than 40 percent impaired, limited or restricted Mobility: Walking and Moving Around Goal Status [phone]): At least 1 percent but less than 20 percent impaired, limited or restricted    Time: 1115-1140 PT Time Calculation (min) (ACUTE ONLY): 25 min   Charges:   PT Evaluation $PT Eval Moderate Complexity: 1 Procedure PT Treatments $Gait Training: 8-22 mins   PT G Codes:   PT G-Codes **NOT FOR INPATIENT CLASS** Functional Assessment Tool Used: clinical judgement, single leg balance Functional Limitation: Mobility: Walking and moving around Mobility: Walking and Moving Around  Current Status [phone]): At least 20 percent but less than 40 percent impaired, limited or restricted Mobility: Walking and Moving Around Goal Status [phone]): At least 1 percent but less than 20 percent impaired, limited or restricted   Phillips Grout PT, DPT   Shawndale Kilpatrick 12/14/2015, 12:10 PM

## 2015-12-14 NOTE — BHH Group Notes (Signed)
[facility] Group Notes:  (Nursing/MHT/Case Management/Adjunct)  Date:  12/14/2015  Time:  2:19 PM  Type of Therapy:  Psychoeducational Skills  Participation Level:  Active  Participation Quality:  Appropriate  Affect:  Appropriate  Cognitive:  Appropriate  Insight:  Appropriate  Engagement in Group:  Engaged  Modes of Intervention:  Discussion and Education  Summary of Progress/Problems:  Drake Leach 12/14/2015, 2:19 PM

## 2015-12-14 NOTE — BHH Group Notes (Signed)
[facility] Group Notes:  (Nursing/MHT/Case Management/Adjunct)  Date:  12/14/2015  Time:  9:13 AM  Type of Therapy:  Community Meeting   Participation Level:  Active  Participation Quality:  Appropriate  Affect:  Appropriate  Cognitive:  Alert, Appropriate and Oriented  Insight:  Appropriate  Engagement in Group:  Engaged  Modes of Intervention:  Discussion  Summary of Progress/Problems:  Carla Thompson Carla Thompson 12/14/2015, 9:13 AM

## 2015-12-14 NOTE — Progress Notes (Signed)
[facility] MD Progress Note  12/14/2015 3:09 PM Carla Thompson  MRN:  [id number]  Subjective:  Carla Thompson continues to be very somatic and disorganized. She has difficulties answering simple questions. She is confused about her medication. She complains of diarrhea and constipation at the same time. She is very little due to abdominal complaints. She is possibly paranoid about the food. She is worried about every part of her body. She reports excessive worries and panic attacks at home. She denies compulsions. She complains of somnolence during the day that she credits to the medicines. We did not change her medicines much except for Depakote. She has a history of fall on the unit and was assessed by physical therapy. They recommended she uses a walker. She reports falling at home recently.   Principal Problem: Schizoaffective disorder, bipolar type ([place]) Diagnosis:   Patient Active Problem List   Diagnosis Date Noted  . Other long term (current) drug therapy [Z79.899] 12/08/2015  . Chronic constipation [K59.00] 12/08/2015  . Insomnia, persistent [G47.00] 12/08/2015  . 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 Gravida 2 para 2 [Z33.1] 12/08/2015  . Blood glucose elevated [R73.9] 12/08/2015  . Hypertriglyceridemia [E78.1] 12/08/2015  . Anemia, iron deficiency [D50.9] 12/08/2015  . Metallic taste [phone]  . 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 Cutaneous eruption [R21] 12/08/2015  . Schizoaffective disorder, bipolar type ([place]) [F25.0] 12/08/2015   Total Time spent with patient: 20 minutes  Past Psychiatric Historybipolar disorder.Past Medical History:  Past Medical History  Diagnosis Date  . Dermatitis   . Bowel incontinence   . Hypertriglyceridemia   . Chronic constipation   . High risk medication use   . Abnormal perimenopausal bleeding   . Hyperglycemia   . Metallic taste   . Schizoaffective disorder, bipolar type ([facility])     Dr. Tyrone Sage  . 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 Tubal ligation  12/02/1994  . Anus surgery  2000    [place]   Family History:  Family History  Problem Relation Age of Onset  . Mental illness Sister     Schizophrenia and bipolar  . Cancer Maternal Grandmother     Colon  . Mental illness Paternal Grandmother     [place] reported.ocial History:  History  Alcohol Use No     History  Drug Use No    Social History   Social History  . Marital Status: Married    Spouse Name: N/A  . Number of Children: N/A  . Years of Education: N/A   Social History Main Topics  . Smoking status: Never Smoker   . Smokeless tobacco: None  . Alcohol Use: No  . Drug Use: No  . Sexual Activity: Not Currently   Other Topics Concern  . None   Social History Narrative   Additional Social History:                         Sleep: Fair  Appetite:  Poor  Current Medications: Current Facility-Administered Medications  Medication Dose Route Frequency Provider Last Rate Last Dose  . acetaminophen (TYLENOL) tablet 650 mg  650 mg Oral Q6H PRN Gonzella Lex, MD      . alum & mag hydroxide-simeth (MAALOX/MYLANTA) 200-200-20 MG/5ML suspension 30 mL  30 mL Oral Q4H PRN Jenny Reichmann T  Clapacs, MD      . clonazePAM (KLONOPIN) tablet 0.5 mg  0.5 mg Oral QHS Jaeger Trueheart B Somtochukwu Woollard, MD   0.5 mg at 12/13/15 2124  . cloZAPine (CLOZARIL) tablet 350 mg  350 mg Oral QHS Gonzella Lex, MD   350 mg at 12/13/15 2124  . divalproex (DEPAKOTE) DR tablet 500 mg  500 mg Oral 3 times per day Clovis Fredrickson, MD   500 mg at 12/14/15 0634  . Linaclotide (LINZESS) capsule 145 mcg  145 mcg Oral Daily Gonzella Lex, MD   145 mcg at 12/14/15 0847  . magnesium hydroxide (MILK OF MAGNESIA) suspension 30 mL  30 mL Oral Daily PRN Gonzella Lex, MD        Lab Results:  Results for orders placed or  performed during the hospital encounter of 12/11/15 (from the past 48 hour(s))  Glucose, capillary     Status: None   Collection Time: 12/13/15 12:37 AM  Result Value Ref Range   Glucose-Capillary 97 65 - 99 mg/dL    Physical Findings: AIMS: Facial and Oral Movements Muscles of Facial Expression: None, normal Lips and Perioral Area: None, normal Jaw: None, normal Tongue: None, normal,Extremity Movements Upper (arms, wrists, hands, fingers): None, normal Lower (legs, knees, ankles, toes): None, normal, Trunk Movements Neck, shoulders, hips: None, normal, Overall Severity Severity of abnormal movements (highest score from questions above): None, normal Incapacitation due to abnormal movements: None, normal Patient's awareness of abnormal movements (rate only patient's report): No Awareness, Dental Status Current problems with teeth and/or dentures?: No Does patient usually wear dentures?: No  CIWA:    COWS:     Musculoskeletal: Strength & Muscle Tone: within normal limits Gait & Station: normal Patient leans: N/A  Psychiatric Specialty Exam: Review of Systems  Gastrointestinal: Positive for nausea, vomiting and constipation.  Musculoskeletal: Positive for myalgias.  All other systems reviewed and are negative.   Blood pressure 98/60, pulse 117, temperature 98.7 F (37.1 C), temperature source Oral, resp. rate 20, height 5\' 4"  (1.626 m), weight 62.143 kg (137 lb), last menstrual period 12/11/2015, SpO2 98 %.Body mass index is 23.5 kg/(m^2).  General Appearance: Casual  Eye Contact::  Fair  Speech:  Clear and Coherent  Volume:  Decreased  Mood:  Dysphoric  Affect:  Congruent  Thought Process:  Disorganized  Orientation:  Full (Time, Place, and Person)  Thought Content:  Delusions and Paranoid Ideation  Suicidal Thoughts:  No  Homicidal Thoughts:  No  Memory:  Immediate;   Fair Recent;   Fair Remote;   Fair  Judgement:  Fair  Insight:  Present  Psychomotor Activity:   Decreased  Concentration:  Fair  Recall:  [employer] of Knowledge:Fair  Language: Fair  Akathisia:  No  Handed:  Right  AIMS (if indicated):     Assets:  Communication Skills Desire for Improvement Financial Resources/Insurance Housing Physical Health Resilience Social Support  ADL's:  Intact  Cognition: WNL  Sleep:  Number of Hours: 6.45   Treatment Plan Summary: Daily contact with patient to assess and evaluate symptoms and progress in treatment and Medication management   Carla Thompson is a 55 year old female with history of bipolar disorder admitted for a psychotic break in the context of good medication compliance.  1. Psychosis. We continue Clozaril and Depakote for mood stabilization.   2. Anxiety. The patient takes 0.5 mg of clonazepam at bedtime for anxiety.  3. Constipation. She is on Linzess.   4. Insomnia. She responded  well to trazodone.  5. Metabolic syndrome.lipid profile, hemoglobin A1c and TSH are normal.  6. Head CT scan after a fall was negative.  7. PT assessment is greatly appreciated.   8. Disposition. She will be discharged to home with the family. She will follow up with her regular psychiatrist.  Orson Slick 12/14/2015, 3:09 PM

## 2015-12-14 NOTE — Progress Notes (Signed)
Recreation Therapy Notes  INPATIENT RECREATION THERAPY ASSESSMENT  Patient Details Name: Carla Thompson MRN: [id number] DOB: 03-13-1961 Today's Date: 12/14/2015  Patient Stressors: Family, Other (Comment) (Worries about daughter because she changes jobs so much and doesn't seem to have a astable job; finances)  Coping Skills:   Isolate, Art/Dance, Talking, Music  Personal Challenges: Concentration, Communication, Decision-Making, Expressing Yourself, Problem-Solving, Self-Esteem/Confidence, Stress Management, Time Management, Trusting Others  Leisure Interests (2+):  Individual - Other (Comment) Charity fundraiser, going to church)  Awareness of Community Resources:  Yes  Community Resources:  [place]  Current Use: Yes  If no, Barriers?:    Patient Strengths:  Organized, loves to talk to people  Patient Identified Areas of Improvement:  Daily overall health  Current Recreation Participation:  Reading  Patient Goal for Hospitalization:  To get better  [place] of Residence:  [place] of Residence:  [place]   Current SI (including self-harm):  No  Current HI:  No  Consent to Intern Participation: N/A   Leonette Monarch, LRT/CTRS 12/14/2015, 4:46 PM

## 2015-12-14 NOTE — Progress Notes (Signed)
Pt remains anxious. Sitter at bedside for safety. Med compliant. Isolates in room except for medications. Denies SI/HI/AV/H. Slept 7.45 hours.

## 2015-12-14 NOTE — Tx Team (Signed)
Interdisciplinary Treatment Plan Update (Adult)  Date:  12/14/2015 Time Reviewed:  1:35 PM  Progress in Treatment: Attending groups: No. Participating in groups:  No. Taking medication as prescribed:  Yes. Tolerating medication:  Yes. Family/Significant othe contact made:  Yes, individual(s) contacted:  patient's husband Patient understands diagnosis:  No. Discussing patient identified problems/goals with staff:  Yes. Medical problems stabilized or resolved:  Yes. Denies suicidal/homicidal ideation: Yes. Issues/concerns per patient self-inventory:  No. Other:  New problem(s) identified: No, Describe:  none reported  Discharge Plan or Barriers: Patient will stabilize on medications for psychosis and discharge home with her husband. Patient will continue follow up with her outpatient provider in [facility].  Reason for Continuation of Hospitalization: Medication stabilization Other; describe pschosis  Comments:  Estimated length of stay:up to 4 days expected discharge Monday 12/18/15  New goal(s):  Review of initial/current patient goals per problem list:   1.  Goal(s):participate in care planning  Met:  No  Target date:by discharge  As evidenced [id number] planning aftercare follow up and arrangements for discahrge  2.  Goal (s):decrease psychosis  Met:  No  Target date:by discharge  As evidenced [id number] symptoms of psychosis decreased  Attendees: Physician:  Orson Slick, MD 1/12/20179:08 AM  Nursing:   Floyde Parkins, RN 1/12/20179:08 AM  Other:  Carmell Austria, [place] 1/12/20179:08 AM  Other:   1/12/20179:08 AM  Other:   1/12/20179:08 AM  Other:  1/12/20179:08 AM  Other:  1/12/20179:08 AM  Other:  1/12/20179:08 AM  Other:  1/12/20179:08 AM  Other:  1/12/20179:08 AM  Other:  1/12/20179:08 AM  Other:   1/12/20179:08 AM     Scribe for Treatment Team:   Keene Breath, MSW, LCSWA [phone] 12/14/2015, 1:35 PM

## 2015-12-14 NOTE — Tx Team (Signed)
Interdisciplinary Treatment Plan Update (Adult)  Date:  12/14/2015 Time Reviewed:  9:37 AM  Progress in Treatment: Attending groups: No. Participating in groups:  No. Taking medication as prescribed:  Yes. Tolerating medication:  Yes. Family/Significant othe contact made:  No, will contact:  Carla Thompson Carla understands diagnosis:  No. Discussing Carla identified problems/goals with staff:  Yes. Medical problems stabilized or resolved:  Yes. Denies suicidal/homicidal ideation: Yes. Issues/concerns per Carla self-inventory:  No. Other:  New problem(s) identified: No, Describe:  none reported  Discharge Plan or Barriers: Carla will stabilize on medications for psychosis and discharge home with her Thompson. Carla will continue follow up with her outpatient provider in [facility]  Reason for Continuation of Hospitalization: Medication stabilization Other; describe pschosis  Comments:  Estimated length of stay:up to 5 days expected discharge Monday 12/18/15  New goal(s):  Review of initial/current Carla goals per problem list:   1.  Goal(s):participate in care planning  Met:  No  Target date:by discharge  As evidenced [id number] planning aftercare follow up and arrangements for discahrge  2.  Goal (s):decrease psychosis  Met:  No  Target date:by discharge  As evidenced [id number] symptoms of psychosis decreased  Attendees: Physician:  Orson Slick, MD 1/12/20179:08 AM  Nursing:   Polly Cobia, RN 1/12/20179:08 AM  Other:  Carmell Austria, [place] 1/12/20179:08 AM  Other:   1/12/20179:08 AM  Other:   1/12/20179:08 AM  Other:  1/12/20179:08 AM  Other:  1/12/20179:08 AM  Other:  1/12/20179:08 AM  Other:  1/12/20179:08 AM  Other:  1/12/20179:08 AM  Other:  1/12/20179:08 AM  Other:   1/12/20179:08 AM     Scribe for Treatment Team:   Keene Breath, MSW, LCSWA [phone] 12/14/2015, 9:37 AM

## 2015-12-14 NOTE — BHH Group Notes (Signed)
[place] LCSW Group Therapy  12/14/2015 8:34 PM  Type of Therapy:  Group Therapy  Participation Level:  Minimal  Participation Quality:  Attentive  Affect:  Flat  Cognitive:  Disorganized  Insight:  Limited  Engagement in Therapy:  Limited  Modes of Intervention:  Discussion, Education, Socialization and Support  Summary of Progress/Problems: Balance in life: Patients will discuss the concept of balance and how it looks and feels to be unbalanced. Pt will identify areas in their life that is unbalanced and ways to become more balanced.  Carla Thompson attended group and stayed the entire time. She sat quietly and listened to other group members share.   [place] MSW, LCSWA  12/14/2015, 8:34 PM

## 2015-12-14 NOTE — Plan of Care (Signed)
Problem: Ineffective individual coping Goal: STG: Pt will be able to identify effective and ineffective STG: Pt will be able to identify effective and ineffective coping patterns  Outcome: Progressing Pt continues with somatic complaints

## 2015-12-14 NOTE — Progress Notes (Signed)
Recreation Therapy Notes  Date: 01.12.17 Time: 3:00 pm Location: Craft Room  Group Topic: Leisure Education/ Coping Skills  Goal Area(s) Addresses:  Patient will identify things they are grateful for. Patient will identify how being grateful can influence decision making.  Behavioral Response: Did not attend  Intervention: Grateful Wheel  Activity: Patients were given an "I Am Grateful For" worksheet and instructed to write at least one thing they are grateful for under each category.  Education: LRT educated patients on leisure and why it is important.  Education Outcome: Patient did not attend group.  Clinical Observations/Feedback: Patient did not attend group.  Leonette Monarch, LRT/CTRS 12/14/2015 4:07 PM

## 2015-12-15 LAB — URINALYSIS COMPLETE WITH MICROSCOPIC (ARMC ONLY)
Bilirubin Urine: NEGATIVE
GLUCOSE, UA: NEGATIVE mg/dL
KETONES UR: NEGATIVE mg/dL
NITRITE: POSITIVE — AB
Protein, ur: NEGATIVE mg/dL
SPECIFIC GRAVITY, URINE: 1.009 (ref 1.005–1.030)
pH: 6 (ref 5.0–8.0)

## 2015-12-15 MED ORDER — FOSFOMYCIN TROMETHAMINE 3 G PO PACK
3.0000 g | PACK | Freq: Once | ORAL | Status: AC
  Administered 2015-12-15: 3 g via ORAL
  Filled 2015-12-15 (×2): qty 3

## 2015-12-15 NOTE — Progress Notes (Signed)
D: Pt c/o sleepiness during the day. States that it is difficult to concentrate during groups due to this. Denies SI/HI/AVH at this time. Denies pain. A: Emotional support and encouragement provided. Medications administered with education. q15 minute safety checks maintained. R: Pt remains on 1:1 sitter for safety. Pt is free from harm.

## 2015-12-15 NOTE — Progress Notes (Signed)
Recreation Therapy Notes  Date: 01.13.17 Time: 3:15 pm Location: Craft Room  Group Topic: Communication, Problem Solving, Teamwork  Goal Area(s) Addresses:  Patient will effectively work with peer towards shared goal. Patient will identify skills used to make activity successful. Patient will identify benefit of using group skills effectively post d/c.  Behavioral Response: Did not attend   Intervention: [employer]  Activity: Patients were given 15 pipe cleaners and instructed to build a free standing tower. After about 6 minutes of them building, patients were instructed to put their dominant hand behind their backs.  Education: LRT educated patients on how they can use these skills outside of the hospital.  Education Outcome: Patient did not attend group.  Clinical Observations/Feedback: Patient did not attend group.  Leonette Monarch, LRT/CTRS 12/15/2015 4:20 PM

## 2015-12-15 NOTE — Progress Notes (Signed)
D: Patient denies SI/HI/AVH.  Patient affect and mood are anxious.  Patient feels that her medications are making her "drowsy" and wants to speak with the doctor about making adjustments.  Patient did attend group. Patient visible on the milieu. No distress noted. A: Support and encouragement offered. Scheduled medications given to pt. 1:1 observation continued for patient safety. R: Patient receptive. Patient remains safe on the unit.

## 2015-12-15 NOTE — Progress Notes (Signed)
[facility] MD Progress Note  12/15/2015 10:31 PM Carla Thompson  MRN:  [id number]  Subjective:  Carla Thompson remains somatic, disorganized and suspicious. She is unsteady on her feet and fell already. She now uses a walker and has sitter. She is paranoid about her medications.  Principal Problem: Schizoaffective disorder, bipolar type ([place]) Diagnosis:   Patient Active Problem List   Diagnosis Date Noted  . Other long term (current) drug therapy [Z79.899] 12/08/2015  . Chronic constipation [K59.00] 12/08/2015  . Insomnia, persistent [G47.00] 12/08/2015  . 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 Gravida 2 para 2 [Z33.1] 12/08/2015  . Blood glucose elevated [R73.9] 12/08/2015  . Hypertriglyceridemia [E78.1] 12/08/2015  . Anemia, iron deficiency [D50.9] 12/08/2015  . Metallic taste [id number]  . 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 Cutaneous eruption [R21] 12/08/2015  . Schizoaffective disorder, bipolar type ([place]) [F25.0] 12/08/2015   Total Time spent with patient: 20 minutes  Past Psychiatric History: bipolar disorder.  Past Medical History:  Past Medical History  Diagnosis Date  . Dermatitis   . Bowel incontinence   . Hypertriglyceridemia   . Chronic constipation   . High risk medication use   . Abnormal perimenopausal bleeding   . Hyperglycemia   . Metallic taste   . Schizoaffective disorder, bipolar type (HCC)     Dr. Tyrone Sage  . 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 Tubal ligation  12/02/1994  . Anus surgery  2000    [place]   Family History:  Family History  Problem Relation Age of Onset  . Mental illness Sister     Schizophrenia and bipolar  . Cancer Maternal Grandmother     Colon  . Mental illness Paternal Grandmother     Schizophrenia   Family Psychiatric  History: none reported. Social History:  History  Alcohol Use No      History  Drug Use No    Social History   Social History  . Marital Status: Married    Spouse Name: N/A  . Number of Children: N/A  . Years of Education: N/A   Social History Main Topics  . Smoking status: Never Smoker   . Smokeless tobacco: None  . Alcohol Use: No  . Drug Use: No  . Sexual Activity: Not Currently   Other Topics Concern  . None   Social History Narrative   Additional Social History:                         Sleep: Fair  Appetite:  Fair  Current Medications: Current Facility-Administered Medications  Medication Dose Route Frequency Provider Last Rate Last Dose  . acetaminophen (TYLENOL) tablet 650 mg  650 mg Oral Q6H PRN Gonzella Lex, MD      . alum & mag hydroxide-simeth (MAALOX/MYLANTA) 200-200-20 MG/5ML suspension 30 mL  30 mL Oral Q4H PRN Gonzella Lex, MD      . clonazePAM Bobbye Charleston) tablet 0.5 mg  0.5 mg Oral QHS Jolanta B Pucilowska, MD   0.5 mg at 12/15/15 2138  . cloZAPine (CLOZARIL) tablet 350 mg  350 mg Oral QHS Gonzella Lex, MD   350 mg at 12/15/15 2138  . divalproex (DEPAKOTE) DR tablet 500 mg  500 mg Oral 3 times per day Clovis Fredrickson, MD   500 mg at 12/15/15 2138  . Linaclotide Rolan Lipa)  capsule 145 mcg  145 mcg Oral Daily Gonzella Lex, MD   145 mcg at 12/15/15 1014  . magnesium hydroxide (MILK OF MAGNESIA) suspension 30 mL  30 mL Oral Daily PRN Gonzella Lex, MD        Lab Results:  Results for orders placed or performed during the hospital encounter of 12/11/15 (from the past 48 hour(s))  Urinalysis complete, with microscopic ([facility] only)     Status: Abnormal   Collection Time: 12/15/15  3:45 AM  Result Value Ref Range   Color, Urine YELLOW (A) YELLOW   APPearance CLOUDY (A) CLEAR   Glucose, UA NEGATIVE NEGATIVE mg/dL   Bilirubin Urine NEGATIVE NEGATIVE   Ketones, ur NEGATIVE NEGATIVE mg/dL   Specific Gravity, Urine 1.009 1.005 - 1.030   Hgb urine dipstick 3+ (A) NEGATIVE   pH 6.0 5.0 - 8.0   Protein, ur  NEGATIVE NEGATIVE mg/dL   Nitrite POSITIVE (A) NEGATIVE   Leukocytes, UA 1+ (A) NEGATIVE   RBC / HPF 6-30 0 - 5 RBC/hpf   WBC, UA 6-30 0 - 5 WBC/hpf   Bacteria, UA FEW (A) NONE SEEN   Squamous Epithelial / LPF 0-5 (A) NONE SEEN    Physical Findings: AIMS: Facial and Oral Movements Muscles of Facial Expression: None, normal Lips and Perioral Area: None, normal Jaw: None, normal Tongue: None, normal,Extremity Movements Upper (arms, wrists, hands, fingers): None, normal Lower (legs, knees, ankles, toes): None, normal, Trunk Movements Neck, shoulders, hips: None, normal, Overall Severity Severity of abnormal movements (highest score from questions above): None, normal Incapacitation due to abnormal movements: None, normal Patient's awareness of abnormal movements (rate only patient's report): No Awareness, Dental Status Current problems with teeth and/or dentures?: No Does patient usually wear dentures?: No  CIWA:    COWS:     Musculoskeletal: Strength & Muscle Tone: within normal limits Gait & Station: normal Patient leans: N/A  Psychiatric Specialty Exam: Review of Systems  Gastrointestinal: Positive for nausea, abdominal pain and diarrhea.  All other systems reviewed and are negative.   Blood pressure 111/65, pulse 119, temperature 98.6 F (37 C), temperature source Oral, resp. rate 20, height 5\' 4"  (1.626 m), weight 62.143 kg (137 lb), last menstrual period 12/11/2015, SpO2 98 %.Body mass index is 23.5 kg/(m^2).  General Appearance: Casual  Eye Contact::  Good  Speech:  Clear and Coherent  Volume:  Normal  Mood:  Dysphoric  Affect:  Congruent  Thought Process:  Disorganized  Orientation:  Full (Time, Place, and Person)  Thought Content:  Delusions and Paranoid Ideation  Suicidal Thoughts:  No  Homicidal Thoughts:  No  Memory:  Immediate;   Fair Recent;   Fair Remote;   Fair  Judgement:  Poor  Insight:  Shallow  Psychomotor Activity:  Normal  Concentration:  Fair   Recall:  [place]  Language: Fair  Akathisia:  No  Handed:  Right  AIMS (if indicated):     Assets:  Communication Skills Desire for Improvement Financial Resources/Insurance Housing Intimacy Resilience Social Support  ADL's:  Intact  Cognition: WNL  Sleep:  Number of Hours: 8.5   Treatment Plan Summary: Daily contact with patient to assess and evaluate symptoms and progress in treatment and Medication management   Carla Thompson is a 55 year old female with history of bipolar disorder admitted for a psychotic break in the context of good medication compliance.  1. Psychosis. We continue Clozaril and Depakote for mood stabilization. VPA level in am.  2. Anxiety. The patient takes 0.5 mg of clonazepam at bedtime for anxiety.  3. Constipation. She is on Linzess.   4. Insomnia. She responded well to trazodone.  5. Metabolic syndrome.lipid profile, hemoglobin A1c and TSH are normal.  6. Head CT scan after a fall was negative.  7. PT assessment is greatly appreciated.   8. UTI. She was given a dose of fosfomycin.  9. Disposition. She will be discharged to home with the family. She will follow up with her regular psychiatrist.   Orson Slick 12/15/2015, 10:31 PM

## 2015-12-15 NOTE — Plan of Care (Signed)
Problem: Ineffective individual coping Goal: STG: Patient will remain free from self harm Outcome: Progressing Pt remains free from harm  Problem: Alteration in thought process Goal: LTG-Patient behavior demonstrates decreased signs psychosis (Patient behavior demonstrates decreased signs of psychosis to the point the patient is safe to return home and continue treatment in an outpatient setting.)  Outcome: Progressing Pt does not appear to be responding to internal stimuli. Denies AVH at this time

## 2015-12-15 NOTE — BHH Group Notes (Signed)
[place] Group Notes:  (Nursing/MHT/Case Management/Adjunct)  Date:  12/15/2015  Time:  12:59 PM  Type of Therapy:  Movement Therapy  Participation Level:  Active  Participation Quality:  Appropriate  Affect:  Appropriate  Cognitive:  Alert, Appropriate and Oriented  Insight:  Appropriate  Engagement in Group:  Engaged  Modes of Intervention:  Activity  Summary of Progress/Problems:  Carla Thompson 12/15/2015, 12:59 PM

## 2015-12-16 LAB — CBC WITH DIFFERENTIAL/PLATELET
BASOS ABS: 0.1 10*3/uL (ref 0–0.1)
BASOS PCT: 2 %
EOS ABS: 0.4 10*3/uL (ref 0–0.7)
Eosinophils Relative: 6 %
HEMATOCRIT: 38.5 % (ref 35.0–47.0)
HEMOGLOBIN: 12.7 g/dL (ref 12.0–16.0)
Lymphocytes Relative: 20 %
Lymphs Abs: 1.3 10*3/uL (ref 1.0–3.6)
MCH: 29.9 pg (ref 26.0–34.0)
MCHC: 33 g/dL (ref 32.0–36.0)
MCV: 90.6 fL (ref 80.0–100.0)
Monocytes Absolute: 0.5 10*3/uL (ref 0.2–0.9)
Monocytes Relative: 8 %
NEUTROS ABS: 4.2 10*3/uL (ref 1.4–6.5)
NEUTROS PCT: 64 %
Platelets: 265 10*3/uL (ref 150–440)
RBC: 4.25 MIL/uL (ref 3.80–5.20)
RDW: 13 % (ref 11.5–14.5)
WBC: 6.6 10*3/uL (ref 3.6–11.0)

## 2015-12-16 LAB — VALPROIC ACID LEVEL: VALPROIC ACID LVL: 89 ug/mL (ref 50.0–100.0)

## 2015-12-16 MED ORDER — DOCUSATE SODIUM 100 MG PO CAPS
200.0000 mg | ORAL_CAPSULE | Freq: Two times a day (BID) | ORAL | Status: DC
  Administered 2015-12-16 – 2015-12-20 (×9): 200 mg via ORAL
  Filled 2015-12-16 (×9): qty 2

## 2015-12-16 MED ORDER — SENNOSIDES-DOCUSATE SODIUM 8.6-50 MG PO TABS
2.0000 | ORAL_TABLET | Freq: Every day | ORAL | Status: DC
  Administered 2015-12-16 – 2015-12-19 (×4): 2 via ORAL
  Filled 2015-12-16 (×3): qty 2

## 2015-12-16 MED ORDER — DIVALPROEX SODIUM ER 500 MG PO TB24
2000.0000 mg | ORAL_TABLET | Freq: Every day | ORAL | Status: DC
  Administered 2015-12-17 – 2015-12-19 (×3): 2000 mg via ORAL
  Filled 2015-12-16 (×3): qty 4

## 2015-12-16 MED ORDER — POLYETHYLENE GLYCOL 3350 17 G PO PACK
17.0000 g | PACK | Freq: Every day | ORAL | Status: DC
  Administered 2015-12-16 – 2015-12-17 (×2): 17 g via ORAL
  Filled 2015-12-16 (×4): qty 1

## 2015-12-16 MED ORDER — METOPROLOL TARTRATE 25 MG PO TABS
12.5000 mg | ORAL_TABLET | Freq: Two times a day (BID) | ORAL | Status: DC
  Administered 2015-12-16 – 2015-12-20 (×8): 12.5 mg via ORAL
  Filled 2015-12-16 (×8): qty 1

## 2015-12-16 NOTE — Progress Notes (Signed)
MEDICATION RELATED CONSULT NOTE -Follow up  Pharmacy Consult for Clozapine Indication: anti-psychotic  Allergies  Allergen Reactions  . Escitalopram Other (See Comments)    Reaction:  Unknown   . Levsin [Hyoscyamine Sulfate] Other (See Comments)    Reaction:  Unknown     Patient Measurements: Height: 5\' 4"  (162.6 cm) Weight: 137 lb (62.143 kg) IBW/kg (Calculated) : 54.7 Adjusted Body Weight: na  Vital Signs: Temp: 97.7 F (36.5 C) (01/14 0652) Temp Source: Oral (01/14 0652) BP: 101/71 mmHg (01/14 0652) Pulse Rate: 121 (01/14 0652) Intake/Output from previous day: 01/13 0701 - 01/14 0700 In: 480 [P.O.:480] Out: -  Intake/Output from this shift: Total I/O In: 120 [P.O.:120] Out: -   Labs:  Recent Labs  12/16/15 0659  WBC 6.6  HGB 12.7  HCT 38.5  PLT 265   1/7  ANC= 7.9  Lab Results  Component Value Date   NEUTROABS 4.2 12/16/2015    Estimated Creatinine Clearance: 63.1 mL/min (by C-G formula based on Cr of 0.89).   Microbiology: No results found for this or any previous visit (from the past 720 hour(s)).  Medical History: Past Medical History  Diagnosis Date  . Dermatitis   . Bowel incontinence   . Hypertriglyceridemia   . Chronic constipation   . High risk medication use   . Abnormal perimenopausal bleeding   . Hyperglycemia   . Metallic taste   . Schizoaffective disorder, bipolar type (HCC)     Dr. Tyrone Sage  . 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 clonazePAM  0.5 mg Oral QHS  . cloZAPine  350 mg Oral QHS  . divalproex  500 mg Oral 3 times per day  . Linaclotide  145 mcg Oral Daily    Assessment: Patient admitted for altered mental status. Was taking Clozepine prior to admission. Checked clozepine registry and submitted lab work.  Patient currently on Clozaril 350mg  at bedtime  Plan:  12/09/15  ANC=7.9    Okay to dispense clozapine. Patient for weekly lab  draws, next due 12/16/15.  12/16/15  ANC 4.2  REported to Clozaril [facility]. Continue to monitor weekly. Next lab due 12/23/15  Chinita Greenland PharmD Clinical Pharmacist 12/16/2015  10:21 AM

## 2015-12-16 NOTE — Progress Notes (Addendum)
[facility] MD Progress Note  12/16/2015 2:27 PM Carla Thompson  MRN:  [id number]  Subjective: Today the patient complained of feeling overly sedated and tired. She thinks is the Depakote to 1 that is causing this sedation. She is states she has been is sleeping well at night. She also complains of severe constipation. States that she has not had a bowel movement in several weeks. Says at home she takes MiraLAX and Colace.  Other than that she denies SI, HI or having auditory or visual hallucinations. She denied other physical complaints. She denied any other side effects from medications.  Per nursing: D: Per pt self inventory pt reports sleeping good, appetite good, energy level normal, ability to pay attention poor, rates depression at a 4 out of 10, hopelessness at a 4 out of 10, anxiety at a 4 out of 10, denies SI/HI/AVH, goal today: "being more hopeful, engage in more activities and read", denies SI/HI/AVH, flat/depressed during interaction, c/o feeling sleepy during the day-feels like it is the medication.   A: MD made aware of patient's concerns, Emotional support provided, Encouraged pt to continue with treatment plan and attend all group activities, q15 min checks maintained for safety.  R: Pt is receptive, going to groups some groups, pleasant and cooperative with staff and other patients on the unit, isolates in room sometimes, still on 1:1 sitter at bedside for safety, pt remains free from harm.  Principal Problem: Schizoaffective disorder, bipolar type ([place]) Diagnosis:   Patient Active Problem List   Diagnosis Date Noted  . Other long term (current) drug therapy [Z79.899] 12/08/2015  . Chronic constipation [K59.00] 12/08/2015  . Insomnia, persistent [G47.00] 12/08/2015  . 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 Gravida 2 para 2 [Z33.1] 12/08/2015  . Blood glucose elevated [R73.9] 12/08/2015  . Hypertriglyceridemia [E78.1] 12/08/2015  . Anemia, iron deficiency [D50.9]  12/08/2015  . Metallic taste [id number]  . 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 Cutaneous eruption [R21] 12/08/2015  . Schizoaffective disorder, bipolar type ([place]) [F25.0] 12/08/2015   Total Time spent with patient: 20 minutes  Past Psychiatric History: bipolar disorder.  Past Medical History:  Past Medical History  Diagnosis Date  . Dermatitis   . Bowel incontinence   . Hypertriglyceridemia   . Chronic constipation   . High risk medication use   . Abnormal perimenopausal bleeding   . Hyperglycemia   . Metallic taste   . Schizoaffective disorder, bipolar type (HCC)     Dr. Tyrone Sage  . 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 Tubal ligation  12/02/1994  . Anus surgery  2000    [place]   Family History:  Family History  Problem Relation Age of Onset  . Mental illness Sister     Schizophrenia and bipolar  . Cancer Maternal Grandmother     Colon  . Mental illness Paternal Grandmother     Schizophrenia   Family Psychiatric  History: none reported. Social History:  History  Alcohol Use No     History  Drug Use No    Social History   Social History  . Marital Status: Married    Spouse Name: N/A  . Number of Children: N/A  . Years of Education: N/A   Social History Main Topics  . Smoking status: Never Smoker   . Smokeless tobacco: None  . Alcohol Use: No  . Drug  Use: No  . Sexual Activity: Not Currently   Other Topics Concern  . None   Social History Narrative    Sleep: Good  Appetite:  Fair  Current Medications: Current Facility-Administered Medications  Medication Dose Route Frequency Provider Last Rate Last Dose  . acetaminophen (TYLENOL) tablet 650 mg  650 mg Oral Q6H PRN Gonzella Lex, MD      . alum & mag hydroxide-simeth (MAALOX/MYLANTA) 200-200-20 MG/5ML suspension 30 mL  30 mL Oral Q4H PRN Gonzella Lex, MD      .  clonazePAM Bobbye Charleston) tablet 0.5 mg  0.5 mg Oral QHS Jolanta B Pucilowska, MD   0.5 mg at 12/15/15 2138  . cloZAPine (CLOZARIL) tablet 350 mg  350 mg Oral QHS Gonzella Lex, MD   350 mg at 12/15/15 2138  . divalproex (DEPAKOTE) DR tablet 500 mg  500 mg Oral 3 times per day Clovis Fredrickson, MD   500 mg at 12/16/15 0648  . Linaclotide (LINZESS) capsule 145 mcg  145 mcg Oral Daily Gonzella Lex, MD   145 mcg at 12/16/15 0947  . magnesium hydroxide (MILK OF MAGNESIA) suspension 30 mL  30 mL Oral Daily PRN Gonzella Lex, MD        Lab Results:  Results for orders placed or performed during the hospital encounter of 12/11/15 (from the past 48 hour(s))  Urinalysis complete, with microscopic ([facility] only)     Status: Abnormal   Collection Time: 12/15/15  3:45 AM  Result Value Ref Range   Color, Urine YELLOW (A) YELLOW   APPearance CLOUDY (A) CLEAR   Glucose, UA NEGATIVE NEGATIVE mg/dL   Bilirubin Urine NEGATIVE NEGATIVE   Ketones, ur NEGATIVE NEGATIVE mg/dL   Specific Gravity, Urine 1.009 1.005 - 1.030   Hgb urine dipstick 3+ (A) NEGATIVE   pH 6.0 5.0 - 8.0   Protein, ur NEGATIVE NEGATIVE mg/dL   Nitrite POSITIVE (A) NEGATIVE   Leukocytes, UA 1+ (A) NEGATIVE   RBC / HPF 6-30 0 - 5 RBC/hpf   WBC, UA 6-30 0 - 5 WBC/hpf   Bacteria, UA FEW (A) NONE SEEN   Squamous Epithelial / LPF 0-5 (A) NONE SEEN  Valproic acid level     Status: None   Collection Time: 12/16/15  6:59 AM  Result Value Ref Range   Valproic Acid Lvl 89 50.0 - 100.0 ug/mL  CBC with Differential/Platelet     Status: None   Collection Time: 12/16/15  6:59 AM  Result Value Ref Range   WBC 6.6 3.6 - 11.0 K/uL   RBC 4.25 3.80 - 5.20 MIL/uL   Hemoglobin 12.7 12.0 - 16.0 g/dL   HCT 38.5 35.0 - 47.0 %   MCV 90.6 80.0 - 100.0 fL   MCH 29.9 26.0 - 34.0 pg   MCHC 33.0 32.0 - 36.0 g/dL   RDW 13.0 11.5 - 14.5 %   Platelets 265 150 - 440 K/uL   Neutrophils Relative % 64 %   Neutro Abs 4.2 1.4 - 6.5 K/uL   Lymphocytes Relative  20 %   Lymphs Abs 1.3 1.0 - 3.6 K/uL   Monocytes Relative 8 %   Monocytes Absolute 0.5 0.2 - 0.9 K/uL   Eosinophils Relative 6 %   Eosinophils Absolute 0.4 0 - 0.7 K/uL   Basophils Relative 2 %   Basophils Absolute 0.1 0 - 0.1 K/uL    Physical Findings: AIMS: Facial and Oral Movements Muscles of Facial Expression: None, normal Lips and  Perioral Area: None, normal Jaw: None, normal Tongue: None, normal,Extremity Movements Upper (arms, wrists, hands, fingers): None, normal Lower (legs, knees, ankles, toes): None, normal, Trunk Movements Neck, shoulders, hips: None, normal, Overall Severity Severity of abnormal movements (highest score from questions above): None, normal Incapacitation due to abnormal movements: None, normal Patient's awareness of abnormal movements (rate only patient's report): No Awareness, Dental Status Current problems with teeth and/or dentures?: No Does patient usually wear dentures?: No  CIWA:    COWS:     Musculoskeletal: Strength & Muscle Tone: within normal limits Gait & Station: normal Patient leans: N/A  Psychiatric Specialty Exam: Review of Systems  Gastrointestinal: Positive for constipation. Negative for nausea, abdominal pain and diarrhea.  Psychiatric/Behavioral: Positive for depression.  All other systems reviewed and are negative.   Blood pressure 101/71, pulse 121, temperature 97.7 F (36.5 C), temperature source Oral, resp. rate 20, height 5\' 4"  (1.626 m), weight 62.143 kg (137 lb), last menstrual period 12/11/2015, SpO2 98 %.Body mass index is 23.5 kg/(m^2).  General Appearance: Casual  Eye Contact::  Good  Speech:  Clear and Coherent  Volume:  Normal  Mood:  Dysphoric  Affect:  Congruent  Thought Process:  Disorganized  Orientation:  Full (Time, Place, and Person)  Thought Content:  Delusions and Paranoid Ideation  Suicidal Thoughts:  No  Homicidal Thoughts:  No  Memory:  Immediate;   Fair Recent;   Fair Remote;   Fair   Judgement:  Poor  Insight:  Shallow  Psychomotor Activity:  Normal  Concentration:  Fair  Recall:  [place]  Language: Fair  Akathisia:  No  Handed:  Right  AIMS (if indicated):     Assets:  Communication Skills Desire for Improvement Financial Resources/Insurance Housing Intimacy Resilience Social Support  ADL's:  Intact  Cognition: WNL  Sleep:  Number of Hours: 7.25   Treatment Plan Summary: Daily contact with patient to assess and evaluate symptoms and progress in treatment and Medication management   Ms. Dade is a 55 year old female with history of bipolar disorder admitted for a psychotic break in the context of good medication compliance.  1. Psychosis. We continue Clozaril and Depakote for mood stabilization. VPA level 89 on 1/14. Due to sedation I will change depakote to ER 2000 mg qhs---will start tomorrow  2. Anxiety: will d/c klonopin due to sedation  3. Constipation: will start colace 200 mg po bid, senokot 2 tabs qhs and miralax daily  4. Insomnia: no longer an issue  5. Metabolic syndrome.lipid profile, hemoglobin A1c and TSH are normal.  6. Head CT scan after a fall was negative.  7. PT assessment is greatly appreciated.   8. UTI. She was given a dose of fosfomycin.  UA this am Not a clean sample.   9. Disposition. She will be discharged to home with the family. She will follow up with her regular psychiatrist.  Tachycardia: Heart rate has been elevated consistently likely secondary to Clozaril. I will restart her on metoprolol 12.5 mg by mouth twice a day.   Hildred Priest 12/16/2015, 2:27 PM

## 2015-12-16 NOTE — BHH Group Notes (Signed)
[place] Group Notes:  (Nursing/MHT/Case Management/Adjunct)  Date:  12/16/2015  Time:  10:45 PM  Type of Therapy:  Group Therapy  Participation Level:  Active  Participation Quality:  Appropriate  Affect:  Appropriate  Cognitive:  Appropriate  Insight:  Appropriate  Engagement in Group:  Engaged  Modes of Intervention:  Support  Summary of Progress/Problems:  Carla Thompson 12/16/2015, 10:45 PM

## 2015-12-16 NOTE — Plan of Care (Signed)
Problem: Alteration in mood; excessive anxiety as evidenced by: Goal: STG-Pt will report an absence of self-harm thoughts/actions (Patient will report an absence of self-harm thoughts or actions)  Outcome: Progressing Patient denies self harm thoughts at this time

## 2015-12-16 NOTE — BHH Group Notes (Signed)
[place] Group Notes:  (Nursing/MHT/Case Management/Adjunct)  Date:  12/16/2015  Time:  10:38 AM  Type of Therapy:  Psychoeducational Skills  Participation Level:  Did Not Attend  Participation Quality:  n/a  Affect:  n/a  Cognitive:  n/a  Insight:  None  Engagement in Group:  n/a  Modes of Intervention:  n/a  Summary of Progress/Problems:  Carla Thompson 12/16/2015, 10:38 AM

## 2015-12-16 NOTE — Plan of Care (Signed)
Problem: Ineffective individual coping Goal: LTG: Patient will report a decrease in negative feelings Outcome: Progressing Pt reports improved mood  Problem: Alteration in thought process Goal: STG-Patient is able to follow short directions Outcome: Progressing Pt follows directions from staff appropriately

## 2015-12-16 NOTE — Progress Notes (Signed)
D:  Per pt self inventory pt reports sleeping good, appetite good, energy level normal, ability to pay attention poor, rates depression at a 4 out of 10, hopelessness at a 4 out of 10, anxiety at a 4 out of 10, denies SI/HI/AVH, goal today: "being more hopeful, engage in more activities and read", denies SI/HI/AVH, flat/depressed during interaction, c/o feeling sleepy during the day-feels like it is the medication.     A:   MD made aware of patient's concerns, Emotional support provided, Encouraged pt to continue with treatment plan and attend all group activities, q15 min checks maintained for safety.  R:  Pt is receptive, going to groups some groups, pleasant and cooperative with staff and other patients on the unit, isolates in room sometimes, still on 1:1 sitter at bedside for safety, pt remains free from harm.

## 2015-12-16 NOTE — Progress Notes (Signed)
D: Pt denies SI/HI/AVH at this time. Denies pain. Pt reports that she wasn't as drowsy today but feels as if her Depakote dosage needs to be decreased. A: Emotional support provided. Pt encouraged to speak to her psychiatrist about any concerns she has regarding her medications. q15 minute checks maintained.  R: Pt remains on 1:1 for safety. Pt remains free from harm.

## 2015-12-17 MED ORDER — MENTHOL 3 MG MT LOZG
1.0000 | LOZENGE | OROMUCOSAL | Status: DC | PRN
  Filled 2015-12-17: qty 9

## 2015-12-17 MED ORDER — GLYCERIN LIQD
Freq: Once | Status: DC
Start: 2015-12-17 — End: 2015-12-18
  Filled 2015-12-17: qty 473

## 2015-12-17 NOTE — Progress Notes (Signed)
Pt has been pleasant and cooperative. Pt remains on 1:1 observation for safety. Pt's mood and affect has been depressed. Pt denies SI and A/V hallucinations. Pt is able to contract for safety. Pt moving freely about the unit via walker. Pt c/o having loose stools then c/o  not having a bowel movement in over two weeks. Pt c/o her ears feeling funny.

## 2015-12-17 NOTE — BHH Group Notes (Signed)
[place] LCSW Group Therapy  12/17/2015 5:41 PM  Type of Therapy:  Group Therapy  Participation Level: Limited   Participation Quality:  Appropriate  Affect:  Appropriate  Cognitive:  Alert  Insight:  Improving  Engagement in Therapy:  Improving  Modes of Intervention:  Discussion, Education, Socialization and Support  Summary of Progress/Problems: Todays topic: Grudges  Patients will be encouraged to discuss their thoughts, feelings, and behaviors as to why one holds on to grudges and reasons why people have grudges. Patients will process the impact of grudges on their daily lives and identify thoughts and feelings related to holding grudges. Patients will identify feelings and thoughts related to what life would look like without grudges.   [place] MSW, LCSWA  12/17/2015, 5:41 PM

## 2015-12-17 NOTE — Progress Notes (Signed)
D: Patient appears sad. 24 hours sitter continues for safety. Patient complaints of sore throat that comes and goes. She states she would wait to see if it was gone in the morning before asking the doctor about it. Patient denies SI/HI/AVH. Patient is still mildly confused and repeats information.  A: Medication was given with education. Encouragement was provided.  R: Patient was compliant with medication. Patient has remained calm and cooperative. Safety maintained with 15 min checks.

## 2015-12-17 NOTE — Progress Notes (Signed)
[facility] MD Progress Note  12/17/2015 9:42 AM LEXXIE Thompson  MRN:  [id number]  Subjective: Yesterday the patient complained of feeling overly sedated and tired. Today she is not sedated.  She is states she has been is sleeping well at night. She also complains of severe constipation, started miralax, colace and senokot but not yet a BM. Wants to change her diet to lactose intolerant.  Says both of her hears are hurting from having too much wax.   Other than that she denies SI, HI or having auditory or visual hallucinations. She denied other physical complaints. She denied any other side effects from medications.  Oral intake has been fair.  Per nursing: D: Patient appears sad. 24 hours sitter continues for safety. Patient complaints of sore throat that comes and goes. She states she would wait to see if it was gone in the morning before asking the doctor about it. Patient denies SI/HI/AVH. Patient is still mildly confused and repeats information.  A: Medication was given with education. Encouragement was provided.  R: Patient was compliant with medication. Patient has remained calm and cooperative. Safety maintained with 15 min checks.   Principal Problem: Schizoaffective disorder, bipolar type ([place]) Diagnosis:   Patient Active Problem List   Diagnosis Date Noted  . Other long term (current) drug therapy [Z79.899] 12/08/2015  . Chronic constipation [K59.00] 12/08/2015  . Insomnia, persistent [G47.00] 12/08/2015  . 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 Gravida 2 para 2 [Z33.1] 12/08/2015  . Blood glucose elevated [R73.9] 12/08/2015  . Hypertriglyceridemia [E78.1] 12/08/2015  . Anemia, iron deficiency [D50.9] 12/08/2015  . Metallic taste [id number]  . 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 Cutaneous eruption [R21] 12/08/2015  . Schizoaffective disorder, bipolar type ([place]) [F25.0] 12/08/2015   Total Time spent with patient: 20 minutes  Past Psychiatric History:  bipolar disorder.  Past Medical History:  Past Medical History  Diagnosis Date  . Dermatitis   . Bowel incontinence   . Hypertriglyceridemia   . Chronic constipation   . High risk medication use   . Abnormal perimenopausal bleeding   . Hyperglycemia   . Metallic taste   . Schizoaffective disorder, bipolar type ([facility])     Dr. Tyrone Sage  . 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 Tubal ligation  12/02/1994  . Anus surgery  2000    [place]   Family History:  Family History  Problem Relation Age of Onset  . Mental illness Sister     Schizophrenia and bipolar  . Cancer Maternal Grandmother     Colon  . Mental illness Paternal Grandmother     Schizophrenia   Family Psychiatric  History: none reported. Social History:  History  Alcohol Use No     History  Drug Use No    Social History   Social History  . Marital Status: Married    Spouse Name: N/A  . Number of Children: N/A  . Years of Education: N/A   Social History Main Topics  . Smoking status: Never Smoker   . Smokeless tobacco: None  . Alcohol Use: No  . Drug Use: No  . Sexual Activity: Not Currently   Other Topics Concern  . None   Social History Narrative    Sleep: Good  Appetite:  Fair  Current Medications: Current Facility-Administered Medications  Medication Dose Route Frequency Provider Last Rate Last Dose  .  acetaminophen (TYLENOL) tablet 650 mg  650 mg Oral Q6H PRN Gonzella Lex, MD      . alum & mag hydroxide-simeth (MAALOX/MYLANTA) 200-200-20 MG/5ML suspension 30 mL  30 mL Oral Q4H PRN Gonzella Lex, MD      . cloZAPine (CLOZARIL) tablet 350 mg  350 mg Oral QHS Gonzella Lex, MD   350 mg at 12/16/15 2106  . divalproex (DEPAKOTE ER) 24 hr tablet 2,000 mg  2,000 mg Oral QHS Hildred Priest, MD      . docusate sodium (COLACE) capsule 200 mg  200 mg Oral BID Hildred Priest, MD   200 mg at 12/17/15 0849  . magnesium hydroxide (MILK OF MAGNESIA) suspension 30 mL  30 mL Oral Daily PRN Gonzella Lex, MD      . metoprolol tartrate (LOPRESSOR) tablet 12.5 mg  12.5 mg Oral BID Hildred Priest, MD   12.5 mg at 12/17/15 0850  . polyethylene glycol (MIRALAX / GLYCOLAX) packet 17 g  17 g Oral Daily Hildred Priest, MD   17 g at 12/17/15 0848  . senna-docusate (Senokot-S) tablet 2 tablet  2 tablet Oral QHS Hildred Priest, MD   2 tablet at 12/16/15 2105    Lab Results:  Results for orders placed or performed during the hospital encounter of 12/11/15 (from the past 48 hour(s))  Valproic acid level     Status: None   Collection Time: 12/16/15  6:59 AM  Result Value Ref Range   Valproic Acid Lvl 89 50.0 - 100.0 ug/mL  CBC with Differential/Platelet     Status: None   Collection Time: 12/16/15  6:59 AM  Result Value Ref Range   WBC 6.6 3.6 - 11.0 K/uL   RBC 4.25 3.80 - 5.20 MIL/uL   Hemoglobin 12.7 12.0 - 16.0 g/dL   HCT 38.5 35.0 - 47.0 %   MCV 90.6 80.0 - 100.0 fL   MCH 29.9 26.0 - 34.0 pg   MCHC 33.0 32.0 - 36.0 g/dL   RDW 13.0 11.5 - 14.5 %   Platelets 265 150 - 440 K/uL   Neutrophils Relative % 64 %   Neutro Abs 4.2 1.4 - 6.5 K/uL   Lymphocytes Relative 20 %   Lymphs Abs 1.3 1.0 - 3.6 K/uL   Monocytes Relative 8 %   Monocytes Absolute 0.5 0.2 - 0.9 K/uL   Eosinophils Relative 6 %   Eosinophils Absolute 0.4 0 - 0.7 K/uL   Basophils Relative 2 %   Basophils Absolute 0.1 0 - 0.1 K/uL    Physical Findings: AIMS: Facial and Oral Movements Muscles of Facial Expression: None, normal Lips and Perioral Area: None, normal Jaw: None, normal Tongue: None, normal,Extremity Movements Upper (arms, wrists, hands, fingers): None, normal Lower (legs, knees, ankles, toes): None, normal, Trunk Movements Neck, shoulders, hips: None, normal, Overall Severity Severity of abnormal movements (highest score from questions  above): None, normal Incapacitation due to abnormal movements: None, normal Patient's awareness of abnormal movements (rate only patient's report): No Awareness, Dental Status Current problems with teeth and/or dentures?: No Does patient usually wear dentures?: No  CIWA:    COWS:     Musculoskeletal: Strength & Muscle Tone: within normal limits Gait & Station: normal Patient leans: N/A  Psychiatric Specialty Exam: Review of Systems  HENT: Negative.   Eyes: Negative.   Respiratory: Negative.   Cardiovascular: Negative.   Gastrointestinal: Positive for constipation. Negative for nausea, abdominal pain and diarrhea.  Genitourinary: Negative.   Musculoskeletal: Negative.  Skin: Negative.   Neurological: Positive for weakness.  Psychiatric/Behavioral: Positive for depression.    Blood pressure 98/66, pulse 99, temperature 97.9 F (36.6 C), temperature source Oral, resp. rate 20, height 5\' 4"  (1.626 m), weight 62.143 kg (137 lb), last menstrual period 12/11/2015, SpO2 98 %.Body mass index is 23.5 kg/(m^2).  General Appearance: Casual  Eye Contact::  Good  Speech:  Clear and Coherent  Volume:  Normal  Mood:  Dysphoric  Affect:  Congruent  Thought Process:  Disorganized  Orientation:  Full (Time, Place, and Person)  Thought Content:  Delusions and Paranoid Ideation  Suicidal Thoughts:  No  Homicidal Thoughts:  No  Memory:  Immediate;   Fair Recent;   Fair Remote;   Fair  Judgement:  Poor  Insight:  Shallow  Psychomotor Activity:  Normal  Concentration:  Fair  Recall:  [place]  Language: Fair  Akathisia:  No  Handed:  Right  AIMS (if indicated):     Assets:  Communication Skills Desire for Improvement Financial Resources/Insurance Housing Intimacy Resilience Social Support  ADL's:  Intact  Cognition: WNL  Sleep:  Number of Hours: 7   Treatment Plan Summary: Daily contact with patient to assess and evaluate symptoms and progress in treatment  and Medication management   Ms. Bricker is a 55 year old female with history of bipolar disorder admitted for a psychotic break in the context of good medication compliance.  1. Psychosis. We continue Clozaril and Depakote for mood stabilization. VPA level 89 on 1/14. Due to sedation I will change depakote to ER 2000 mg qhs---will start tonight  2. Anxiety: will d/c klonopin due to sedation  3. Constipation: will start colace 200 mg po bid, senokot 2 tabs qhs and miralax daily  4. Insomnia: no longer an issue--- slept 7 hours last night  5. Metabolic syndrome.lipid profile, hemoglobin A1c and TSH are normal.  6. Head CT scan after a fall was negative.  7. PT assessment is greatly appreciated.   8. UTI. She was given a dose of fosfomycin.  UA this am Not a clean sample. Not having symptoms  9. Disposition. She will be discharged to home with the family. She will follow up with her regular psychiatrist.  Tachycardia: Heart rate has been elevated consistently likely secondary to Clozaril. Started on metoprolol 12.5 mg by mouth twice a day on January 13  Glycerin for ears  Changed diet per her request to lactose intolerant   Hildred Priest 12/17/2015, 9:42 AM

## 2015-12-18 MED ORDER — CLONAZEPAM 0.5 MG PO TABS
0.5000 mg | ORAL_TABLET | Freq: Every day | ORAL | Status: DC
  Administered 2015-12-18 – 2015-12-19 (×3): 0.5 mg via ORAL
  Filled 2015-12-18 (×3): qty 1

## 2015-12-18 MED ORDER — CARBAMIDE PEROXIDE 6.5 % OT SOLN
5.0000 [drp] | Freq: Two times a day (BID) | OTIC | Status: DC
  Administered 2015-12-18 – 2015-12-20 (×5): 5 [drp] via OTIC
  Filled 2015-12-18: qty 15

## 2015-12-18 NOTE — Progress Notes (Signed)
Initial Nutrition Assessment   INTERVENTION:  Meals and Snacks: encourage menu completion to best meet pt preferences Coordination of Care: No nutrition intervention warranted at this time. Will sign off. Please re-consult RD if nutritional issues arise.    NUTRITION DIAGNOSIS:  No nutrition diagnosis at this time   GOAL:  Patient will meet greater than or equal to 90% of their needs; met, continue current goal  MONITOR:  Energy Intake, Anthropometrics   REASON FOR ASSESSMENT:  LOS  ASSESSMENT:  Carla Thompson is a 55 y.o. female with Schizoaffective disorder, bipolar type ([place])  Past Medical History  Diagnosis Date  . Dermatitis   . Bowel incontinence   . Hypertriglyceridemia   . Chronic constipation   . High risk medication use   . Abnormal perimenopausal bleeding   . Hyperglycemia   . Metallic taste   . Schizoaffective disorder, bipolar type ([facility])     Dr. Tyrone Sage  . 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   Medications: reviewed  Protein Profile:  Protein Profile: No results for input(s): ALBUMIN in the last 168 hours.   Glucose and Electrolyte/Renal profile:  No results for input(s): NA, K, CL, CO2, BUN, CREATININE, CALCIUM, MG, PHOS, GLUCOSE in the last 168 hours.  Digestive System: 12/18/2015  Anthropometrics:   Body mass index is 23.5 kg/(m^2).  Filed Weights   12/11/15 1725  Weight: 137 lb (62.143 kg)      Dwyane Luo, RD, LDN Pager [phone] Weekend/On-Call Pager [phone]

## 2015-12-18 NOTE — [facility] Group Notes (Signed)
[facility] Group Notes:  (Nursing/MHT/Case Management/Adjunct)  Date:  12/18/2015  Time:  12:56 PM  Type of Therapy:  Group Therapy  Participation Level:  Active  Participation Quality:  Sharing  Affect:  Appropriate  Cognitive:  Appropriate  Insight:  Good  Engagement in Group:  Improving  Modes of Intervention:  Activity  Summary of Progress/Problems:  Carla Thompson 12/18/2015, 12:56 PM

## 2015-12-18 NOTE — Progress Notes (Signed)
Recreation Therapy Notes  Date: 01.16.17 Time: 3:00 pm Location: [facility]  Group Topic: Wellness  Goal Area(s) Addresses:  Patient will identify at least one item per dimension of health. Patient will examine areas they are deficient in.  Behavioral Response: Attentive, Interactive  Intervention: 6 Dimensions of Health  Activity: Patients were given a worksheet with the definitions of the 6 dimensions of health. Patients were given a worksheet with the 6 dimensions of health listed and were encouraged to write 2-3 things they are currently doing in each category.  Education: LRT educated patients on how this activity is related to their admission and d/c.  Education Outcome: In group clarification offered  Clinical Observations/Feedback: Patient completed activity by writing at least 2 items in each category with assistance from LRT. Patient contributed to group discussion by stating things she could do to increase certain categories.  Leonette Monarch, LRT/CTRS 12/18/2015 4:30 PM

## 2015-12-18 NOTE — Progress Notes (Signed)
Physical Therapy Treatment Patient Details Name: Carla Thompson MRN: [id number] DOB: 11/12/1961 Today's Date: 12/18/2015    History of Present Illness Carla Thompson is a 55 y.o. female with history of schizoaffective disorder bipolar type. Presents to the emergency department because family concerns for abnormal behavior. The patient herself will not answer my questions. Per triage note the patient has not been sleeping for a couple of days. The patient herself just repeats help me. She would nod or could not say why she was in the emergency department today. She did not answer in the affirmative to any questions about recent illnesses, chest pain, shortness of breath, nausea vomiting or diarrhea.    PT Comments    Pt demonstrates considerable improvement in mobility since initial evaluation. She is able to ambulate at supervision/modified independent with rolling walker and has been doing so between evaluation and this treatment. Pt scored 56/56 on BERG indicating low risk for falls. Single leg balance is >20 seconds on each leg. 10' gait speed is functional for limited community ambulation. 5TSTS is WNL for age/gender norms and indicate low fall risk. Pt does appear to demonstrate improved confidence with rolling walker. She has a history of falls prior and during admission and would benefit from continued use due to poor safety/judgement as well as fluctuating cognition. Pt does not demonstrate any further PT needs at this time. She will be discharged from PT services currently. If status or needs change please enter new order.    Follow Up Recommendations  Supervision - Intermittent;No PT follow up (Poor awareness/judgement, Hx of falls, med alert bracelet?)     Equipment Recommendations  Rolling walker with 5" wheels;Other (comment) (Shower chair to be purchased independently)    Recommendations for Other Services       Precautions / Restrictions Precautions Precautions:  Fall Restrictions Weight Bearing Restrictions: No    Mobility  Bed Mobility               General bed mobility comments: Pt received walking in room indepedently  Transfers Overall transfer level: Needs assistance Equipment used: None Transfers: Sit to/from Stand Sit to Stand: Independent         General transfer comment: Pt able to perform sit to stand without assist and without UE support. Good strength and stability noted. 5TSTS: 10.1 seconds  Ambulation/Gait Ambulation/Gait assistance: Supervision Ambulation Distance (Feet): 450 Feet Assistive device: Rolling walker (2 wheeled) (Progressing to no assistive device) Gait Pattern/deviations: WFL(Within Functional Limits) Gait velocity: 2.38 ft/second, Limited community ambulation. Pt able to increase upon cues to fast gait speed   General Gait Details: Pt demonstrates significantly improved gait since initial evaluation. Pt has been walking around unit independently with rolling walker over the last few days. She demonstrates good stability with rolling walker. Progressed to no assistive device and gait speed slow slightly and pt appears to have less confidence due to increased fear of falling. Modified DGI: 12/12. 10' gait speed=4.2 seconds (2.38 ft/second). Pt transitioned back to walker due to increased confidence and comfort. Pt with falls during and prior to admission.   Stairs            Wheelchair Mobility    Modified Rankin (Stroke Patients Only)       Balance Overall balance assessment: Needs assistance   Sitting balance-Leahy Scale: Normal       Standing balance-Leahy Scale: Good   Single Leg Stance - Right Leg: 20 Single Leg Stance - Left Leg: 20       [place]berg - Eyes Opened: 30 [place] - Eyes Closed: 30        Cognition Arousal/Alertness: Awake/alert Behavior During Therapy: Restless (Thoughts are tangental but clearer than evaluation) Overall Cognitive Status: No family/caregiver  present to determine baseline cognitive functioning (AOx4)                      Exercises      General Comments        Pertinent Vitals/Pain Pain Assessment: 0-10 Pain Location: Pt will not rate but reports "a little stomach upset" Pain Intervention(s): Monitored during session    Home Living                      Prior Function            PT Goals (current goals can now be found in the care plan section) Acute Rehab PT Goals Patient Stated Goal: "I don't know how I'm going to get around at home." Pt struggles with goal setting but appears to want to improve her function at home PT Goal Formulation: With patient Time For Goal Achievement: 12/28/15 Potential to Achieve Goals: Fair Progress towards PT goals: Goals met/education completed, patient discharged from PT    Frequency  Min 2X/week    PT Plan Discharge plan needs to be updated;Other (comment) (Complete order, discharge from PT)    Co-evaluation             End of Session Equipment Utilized During Treatment: Gait belt Activity Tolerance: Patient tolerated treatment well Patient left: Other (comment) (Pt left walking around room. )     Time: 1420-1435 PT Time Calculation (min) (ACUTE ONLY): 15 min  Charges:  $Neuromuscular Re-education: 8-22 mins                    G Codes:  Functional Assessment Tool Used: clinical judgement, single leg balance Functional Limitation: Mobility: Walking and moving around Mobility: Walking and Moving Around Goal Status (G8979): At least 1 percent but less than 20 percent impaired, limited or restricted Mobility: Walking and Moving Around Discharge Status (G8980): At least 1 percent but less than 20 percent impaired, limited or restricted    D  PT, DPT   , 12/18/2015, 5:03 PM   

## 2015-12-18 NOTE — Progress Notes (Signed)
D: Patient affect flat. 24 hours sitter continues for safety. Patient denies SI/HI/AVH. Patient is still mildly confused and repeats information. Complained of constipation at start of shift. After drinking prune juice and water and walking the unit Pt was able to have a large BM. Sitter called nurses station around [place] and stated pt was having issues sleeping but after checking on Pt, she was resting in bed with eyes closed.  A: Medication was given with education. Encouragement to continue following treatment plan provided. Q15 minute checks and sitter supervision maintained for safety.  R: Patient was compliant with medication. Patient has remained calm and cooperative. Safety maintained with 15 min checks. Denies pain and voiced no additional concerns other than constipation which was resolved this shift.

## 2015-12-18 NOTE — Progress Notes (Signed)
D:  Per pt self inventory pt reports sleeping poor, appetite good, energy level low, ability to pay attention poor, rates depression at a 7 out of 10, hopelessness at a 7 out of 10, anxiety at a 7 out of 10, denies SI/HI/AVH, goal today: "to go to groups, stay busy", pt is flat/depressed has multiple somatic complaints daily, MD aware, pt ambulates with a walker, per MD 1:1 sitter was d/c'd today.   A:  Emotional support provided, Encouraged pt to continue with treatment plan and attend all group activities, q15 min checks maintained for safety, provided pt teaching for fall prevention, pt verbalized understanding and return demonstrated use of call bell system, provided pt with yellow skid free socks.  R:  Pt is receptive, going to groups, pleasant and cooperative with staff and other patients on the unit.

## 2015-12-18 NOTE — Progress Notes (Signed)
[facility] MD Progress Note  12/18/2015 1:58 PM Carla Thompson  MRN:  [id number]  Subjective:  Carla Thompson  remains disorganized and very somatic. This weekend she felt she was short of hearing. She  Insists on having a sitter.  This was instituted after a fall week ago. The patient was assessed by physical therapy and is using a walker now. She seems to be confused still.  There was an attempt to discontinue clonazepam that she uses for sleep but she complained of insomnia. There are multiple minor somatic complaints about her ears, fingernails and  Abdomen.   Principal Problem: Schizoaffective disorder, bipolar type ([place]) Diagnosis:   Patient Active Problem List   Diagnosis Date Noted  . Other long term (current) drug therapy [Z79.899] 12/08/2015  . Chronic constipation [K59.00] 12/08/2015  . Insomnia, persistent [G47.00] 12/08/2015  . 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 Gravida 2 para 2 [Z33.1] 12/08/2015  . Blood glucose elevated [R73.9] 12/08/2015  . Hypertriglyceridemia [E78.1] 12/08/2015  . Anemia, iron deficiency [D50.9] 12/08/2015  . Metallic taste 0000000 [id number]  . 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 Cutaneous eruption [R21] 12/08/2015  . Schizoaffective disorder, bipolar type ([place]) [F25.0] 12/08/2015   Total Time spent with patient: 20 minutes  Past Psychiatric History:  Bipolar disorder.  Past Medical History:  Past Medical History  Diagnosis Date  . Dermatitis   . Bowel incontinence   . Hypertriglyceridemia   . Chronic constipation   . High risk medication use   . Abnormal perimenopausal bleeding   . Hyperglycemia   . Metallic taste   . Schizoaffective disorder, bipolar type (HCC)     Dr. Tyrone Sage  . 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 Tubal ligation  12/02/1994  . Anus surgery  2000    [place]   Family History:  Family History   Problem Relation Age of Onset  . Mental illness Sister     Schizophrenia and bipolar  . Cancer Maternal Grandmother     Colon  . Mental illness Paternal Grandmother     Schizophrenia   Family Psychiatric  History:  See H&P. Social History:  History  Alcohol Use No     History  Drug Use No    Social History   Social History  . Marital Status: Married    Spouse Name: N/A  . Number of Children: N/A  . Years of Education: N/A   Social History Main Topics  . Smoking status: Never Smoker   . Smokeless tobacco: None  . Alcohol Use: No  . Drug Use: No  . Sexual Activity: Not Currently   Other Topics Concern  . None   Social History Narrative   Additional Social History:                         Sleep: Poor  Appetite:  Fair  Current Medications: Current Facility-Administered Medications  Medication Dose Route Frequency Provider Last Rate Last Dose  . acetaminophen (TYLENOL) tablet 650 mg  650 mg Oral Q6H PRN Gonzella Lex, MD      . alum & mag hydroxide-simeth (MAALOX/MYLANTA) 200-200-20 MG/5ML suspension 30 mL  30 mL Oral Q4H PRN Gonzella Lex, MD      . carbamide peroxide (DEBROX) 6.5 % otic solution 5 drop  5 drop Both Ears BID Clovis Fredrickson, MD      .  clonazePAM (KLONOPIN) tablet 0.5 mg  0.5 mg Oral QHS Hildred Priest, MD   0.5 mg at 12/18/15 0329  . cloZAPine (CLOZARIL) tablet 350 mg  350 mg Oral QHS Gonzella Lex, MD   350 mg at 12/17/15 2155  . divalproex (DEPAKOTE ER) 24 hr tablet 2,000 mg  2,000 mg Oral QHS Hildred Priest, MD   2,000 mg at 12/17/15 2154  . docusate sodium (COLACE) capsule 200 mg  200 mg Oral BID Hildred Priest, MD   200 mg at 12/18/15 0857  . magnesium hydroxide (MILK OF MAGNESIA) suspension 30 mL  30 mL Oral Daily PRN Gonzella Lex, MD      . menthol-cetylpyridinium (CEPACOL) lozenge 3 mg  1 lozenge Oral PRN Hildred Priest, MD      . metoprolol tartrate (LOPRESSOR) tablet 12.5 mg   12.5 mg Oral BID Hildred Priest, MD   12.5 mg at 12/17/15 2155  . polyethylene glycol (MIRALAX / GLYCOLAX) packet 17 g  17 g Oral Daily Hildred Priest, MD   17 g at 12/17/15 0848  . senna-docusate (Senokot-S) tablet 2 tablet  2 tablet Oral QHS Hildred Priest, MD   2 tablet at 12/17/15 2207    Lab Results: No results found for this or any previous visit (from the past 48 hour(s)).  Physical Findings: AIMS: Facial and Oral Movements Muscles of Facial Expression: None, normal Lips and Perioral Area: None, normal Jaw: None, normal Tongue: None, normal,Extremity Movements Upper (arms, wrists, hands, fingers): None, normal Lower (legs, knees, ankles, toes): None, normal, Trunk Movements Neck, shoulders, hips: None, normal, Overall Severity Severity of abnormal movements (highest score from questions above): None, normal Incapacitation due to abnormal movements: None, normal Patient's awareness of abnormal movements (rate only patient's report): No Awareness, Dental Status Current problems with teeth and/or dentures?: No Does patient usually wear dentures?: No  CIWA:    COWS:     Musculoskeletal: Strength & Muscle Tone: within normal limits Gait & Station: normal Patient leans: N/A  Psychiatric Specialty Exam: Review of Systems  HENT: Positive for hearing loss.   All other systems reviewed and are negative.   Blood pressure 97/63, pulse 94, temperature 98.7 F (37.1 C), temperature source Oral, resp. rate 20, height 5\' 4"  (1.626 m), weight 62.143 kg (137 lb), last menstrual period 12/11/2015, SpO2 98 %.Body mass index is 23.5 kg/(m^2).  General Appearance: Casual  Eye Contact::  Fair  Speech:  Clear and Coherent  Volume:  Normal  Mood:  Depressed  Affect:  Congruent  Thought Process:  Disorganized  Orientation:  Full (Time, Place, and Person)  Thought Content:  Delusions and Paranoid Ideation  Suicidal Thoughts:  No  Homicidal Thoughts:  No   Memory:  Immediate;   Fair Recent;   Fair Remote;   Fair  Judgement:  Poor  Insight:  Shallow  Psychomotor Activity:  Normal  Concentration:  Fair  Recall:  [place]  Language: Fair  Akathisia:  No  Handed:  Right  AIMS (if indicated):     Assets:  Communication Skills Desire for Improvement Financial Resources/Insurance Housing Intimacy Resilience Social Support  ADL's:  Intact  Cognition: WNL  Sleep:  Number of Hours: 4.45   Treatment Plan Summary: Daily contact with patient to assess and evaluate symptoms and progress in treatment and Medication management   Ms. Champa is a 55 year old female with history of bipolar disorder admitted for a psychotic break in the context of good medication compliance.  1. Psychosis.  We continue Clozaril and Depakote for mood stabilization. VPA level 89 on 1/14. Due to sedation Depakote  Was switched to nighttime.  2. Anxiety:  We will restart clonazepam 1.5 mg at bedtime.  3. Constipation: will start colace 200 mg po bid, senokot 2 tabs qhs and miralax daily.  The patient requested lactose-free diet.  4. Insomnia:  The patient slept only 4 hours last night.  5. Metabolic syndrome.lipid profile, hemoglobin A1c and TSH are normal.  6. Head CT scan after a fall was negative.  7. PT assessment is greatly appreciated.   8. UTI. She was given a dose of fosfomycin.   9. Hearing problems. We started Debrox.  10. Tachycardia: Heart rate has been elevated consistently likely secondary to Clozaril. Started on metoprolol 12.5 mg by mouth twice a day on January 13.  We had to hold metoprolol today due to hypotonia.  11. Disposition. She will be discharged to home with family. She will follow up with her regular psychiatrist.   Orson Slick 12/18/2015, 1:58 PM

## 2015-12-19 LAB — CK ISOENZYMES
CK MM: 100 % (ref 97–100)
CK-BB: 0 %
CK-MB: 0 % (ref 0–3)
Creatine Kinase-Total: 230 U/L — ABNORMAL HIGH (ref 24–173)
MACRO TYPE 2: 0 %
Macro Type 1: 0 %

## 2015-12-19 MED ORDER — HYDROCORTISONE 1 % EX CREA
TOPICAL_CREAM | Freq: Two times a day (BID) | CUTANEOUS | Status: DC
  Administered 2015-12-19 – 2015-12-20 (×3): via TOPICAL
  Filled 2015-12-19 (×2): qty 28

## 2015-12-19 MED ORDER — ROPINIROLE HCL 1 MG PO TABS
1.0000 mg | ORAL_TABLET | Freq: Every day | ORAL | Status: DC
  Administered 2015-12-19: 1 mg via ORAL
  Filled 2015-12-19 (×2): qty 1

## 2015-12-19 NOTE — Progress Notes (Signed)
[facility] MD Progress Note  12/19/2015 2:46 PM CLAYTON SCHIFFMAN  MRN:  [id number]  Subjective:  Ms. Guardian feels slightly better today but still has multiple somatic complaints. Today it's her foot. There seems to be no problem with her feet. Her sister was discontinued yesterday and the patient manages her day pretty well. He participates in programming. She took shower without assistance. She was able to take care of her questionnaire as this morning. She takes medications with encouragement. Her husband visited over the weekend and sees much improvement. We will discharge destination.  Principal Problem: Schizoaffective disorder, bipolar type ([place]) Diagnosis:   Patient Active Problem List   Diagnosis Date Noted  . Other long term (current) drug therapy [Z79.899] 12/08/2015  . Chronic constipation [K59.00] 12/08/2015  . Insomnia, persistent [G47.00] 12/08/2015  . 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 Gravida 2 para 2 [Z33.1] 12/08/2015  . Blood glucose elevated [R73.9] 12/08/2015  . Hypertriglyceridemia [E78.1] 12/08/2015  . Anemia, iron deficiency [D50.9] 12/08/2015  . Metallic taste 0000000 [id number]  . 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 Cutaneous eruption [R21] 12/08/2015  . Schizoaffective disorder, bipolar type ([place]) [F25.0] 12/08/2015   Total Time spent with patient: 20 minutes  Past Psychiatric History:  Bipolar disorder.  Past Medical History:  Past Medical History  Diagnosis Date  . Dermatitis   . Bowel incontinence   . Hypertriglyceridemia   . Chronic constipation   . High risk medication use   . Abnormal perimenopausal bleeding   . Hyperglycemia   . Metallic taste   . Schizoaffective disorder, bipolar type (HCC)     Dr. Tyrone Sage  . 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 Tubal ligation  12/02/1994  . Anus surgery  2000    [place]   Family  History:  Family History  Problem Relation Age of Onset  . Mental illness Sister     Schizophrenia and bipolar  . Cancer Maternal Grandmother     Colon  . Mental illness Paternal Grandmother     Schizophrenia   Family Psychiatric  History:  None reported. Social History:  History  Alcohol Use No     History  Drug Use No    Social History   Social History  . Marital Status: Married    Spouse Name: N/A  . Number of Children: N/A  . Years of Education: N/A   Social History Main Topics  . Smoking status: Never Smoker   . Smokeless tobacco: None  . Alcohol Use: No  . Drug Use: No  . Sexual Activity: Not Currently   Other Topics Concern  . None   Social History Narrative   Additional Social History:                         Sleep: Fair  Appetite:  Fair  Current Medications: Current Facility-Administered Medications  Medication Dose Route Frequency Provider Last Rate Last Dose  . acetaminophen (TYLENOL) tablet 650 mg  650 mg Oral Q6H PRN Gonzella Lex, MD   650 mg at 12/18/15 1952  . alum & mag hydroxide-simeth (MAALOX/MYLANTA) 200-200-20 MG/5ML suspension 30 mL  30 mL Oral Q4H PRN Gonzella Lex, MD      . carbamide peroxide (DEBROX) 6.5 % otic solution 5 drop  5 drop Both Ears BID Clovis Fredrickson, MD   5  drop at 12/19/15 0957  . clonazePAM (KLONOPIN) tablet 0.5 mg  0.5 mg Oral QHS Hildred Priest, MD   0.5 mg at 12/18/15 2123  . cloZAPine (CLOZARIL) tablet 350 mg  350 mg Oral QHS Gonzella Lex, MD   350 mg at 12/18/15 2121  . divalproex (DEPAKOTE ER) 24 hr tablet 2,000 mg  2,000 mg Oral QHS Hildred Priest, MD   2,000 mg at 12/18/15 2122  . docusate sodium (COLACE) capsule 200 mg  200 mg Oral BID Hildred Priest, MD   200 mg at 12/19/15 0951  . magnesium hydroxide (MILK OF MAGNESIA) suspension 30 mL  30 mL Oral Daily PRN Gonzella Lex, MD      . menthol-cetylpyridinium (CEPACOL) lozenge 3 mg  1 lozenge Oral PRN Hildred Priest, MD      . metoprolol tartrate (LOPRESSOR) tablet 12.5 mg  12.5 mg Oral BID Hildred Priest, MD   12.5 mg at 12/19/15 0952  . polyethylene glycol (MIRALAX / GLYCOLAX) packet 17 g  17 g Oral Daily Hildred Priest, MD   17 g at 12/17/15 0848  . senna-docusate (Senokot-S) tablet 2 tablet  2 tablet Oral QHS Hildred Priest, MD   2 tablet at 12/18/15 2122    Lab Results: No results found for this or any previous visit (from the past 48 hour(s)).  Physical Findings: AIMS: Facial and Oral Movements Muscles of Facial Expression: None, normal Lips and Perioral Area: None, normal Jaw: None, normal Tongue: None, normal,Extremity Movements Upper (arms, wrists, hands, fingers): None, normal Lower (legs, knees, ankles, toes): None, normal, Trunk Movements Neck, shoulders, hips: None, normal, Overall Severity Severity of abnormal movements (highest score from questions above): None, normal Incapacitation due to abnormal movements: None, normal Patient's awareness of abnormal movements (rate only patient's report): No Awareness, Dental Status Current problems with teeth and/or dentures?: No Does patient usually wear dentures?: No  CIWA:    COWS:     Musculoskeletal: Strength & Muscle Tone: within normal limits Gait & Station: normal Patient leans: N/A  Psychiatric Specialty Exam: Review of Systems  All other systems reviewed and are negative.   Blood pressure 106/74, pulse 99, temperature 97.8 F (36.6 C), temperature source Oral, resp. rate 20, height 5\' 4"  (1.626 m), weight 62.143 kg (137 lb), last menstrual period 12/11/2015, SpO2 98 %.Body mass index is 23.5 kg/(m^2).  General Appearance: Fairly Groomed  Engineer, water::  Good  Speech:  Clear and Coherent  Volume:  Normal  Mood:  Anxious  Affect:  Appropriate  Thought Process:  Disorganized  Orientation:  Full (Time, Place, and Person)  Thought Content:  Delusions and Paranoid Ideation   Suicidal Thoughts:  No  Homicidal Thoughts:  No  Memory:  Immediate;   Fair Recent;   Fair Remote;   Fair  Judgement:  Impaired  Insight:  Shallow  Psychomotor Activity:  Decreased  Concentration:  Fair  Recall:  [place]  Language: Fair  Akathisia:  No  Handed:  Right  AIMS (if indicated):     Assets:  Communication Skills Desire for Improvement Financial Resources/Insurance Housing Intimacy Resilience Social Support  ADL's:  Intact  Cognition: WNL  Sleep:  Number of Hours: 8   Treatment Plan Summary: Daily contact with patient to assess and evaluate symptoms and progress in treatment and Medication management   Ms. Matto is a 55 year old female with history of bipolar disorder admitted for a psychotic break in the context of good medication compliance.  1. Psychosis.  We continue Clozaril and Depakote for mood stabilization. VPA level 89 on 1/14. Due to sedation Depakote Was switched to nighttime.  2. Anxiety: We will restart clonazepam 0.5 mg at bedtime. I believe that the patient would benefit from Luvox for OCD type of symptoms but she is unwilling to change any of her medications.  3. Constipation: will start colace 200 mg po bid, senokot 2 tabs qhs and miralax daily. The patient requested lactose-free diet.  4. Insomnia: The patient slept only 4 hours last night.  5. Metabolic syndrome.lipid profile, hemoglobin A1c and TSH are normal.  6. Head CT scan after a fall was negative.  7. PT assessment is greatly appreciated.   8. UTI. She was given a dose of fosfomycin.   9. Hearing problems. We started Debrox.  10. Tachycardia: Heart rate has been elevated consistently likely secondary to Clozaril. Started on metoprolol 12.5 mg by mouth twice a day on January 13. We had to hold metoprolol today due to hypotonia.  11. Disposition. She will be discharged to home with family. She will follow up with her regular psychiatrist.  Orson Slick 12/19/2015, 2:46 PM

## 2015-12-19 NOTE — Plan of Care (Signed)
Problem: Alteration in thought process Goal: LTG-Patient has not harmed self or others in at least 2 days Outcome: Progressing Patient has not displayed any self harm behaviors in the last 2 days

## 2015-12-19 NOTE — Progress Notes (Signed)
D: Pt isolates in her room most of this evening. Denies SI/HI/AVH at this time. Pt c/o pain rated a 10 out of 10. She request PRN Tylenol for the pain. Pt affect is flat and depressed. She does appear anxious upon assessment. Pt ambulates with the use of a walker. A: Emotional support and encouragement provided. Medications administered with education. q15 minute safety checks maintained. R: Pt remains free from harm.

## 2015-12-19 NOTE — Plan of Care (Signed)
Problem: Alteration in thought process Goal: LTG-Patient verbalizes understanding importance med regimen (Patient verbalizes understanding of importance of medication regimen and need to continue outpatient care.)  Outcome: Progressing Pt understands importance of medications and accepts education and instructions from staff.

## 2015-12-19 NOTE — Tx Team (Signed)
Interdisciplinary Treatment Plan Update (Adult)  Date:  12/19/2015 Time Reviewed:  3:43 PM  Progress in Treatment: Attending groups: Yes. Participating in groups:  Yes. Taking medication as prescribed:  Yes. Tolerating medication:  Yes. Family/Significant othe contact made:  Yes, individual(s) contacted:  patient's husband Patient understands diagnosis:  No. Discussing patient identified problems/goals with staff:  Yes. Medical problems stabilized or resolved:  Yes. Denies suicidal/homicidal ideation: Yes. Issues/concerns per patient self-inventory:  No. Other:  New problem(s) identified: No, Describe:  none reported  Discharge Plan or Barriers: Patient will stabilize on medications for psychosis and discharge home with her husband. Patient will continue follow up with her outpatient provider in [facility].  Reason for Continuation of Hospitalization: Medication stabilization Other; describe pschosis  Comments:  Estimated length of stay:up to 6 days expected discharge Monday 12/25/15  New goal(s):  Review of initial/current patient goals per problem list:   1.  Goal(s):participate in care planning  Met:  No  Target date:by discharge  As evidenced [id number] planning aftercare follow up and arrangements for discahrge  2.  Goal (s):decrease psychosis  Met:  No  Target date:by discharge  As evidenced [id number] symptoms of psychosis decreased  Attendees: Physician:  Orson Slick, MD 1/12/20179:08 AM  Nursing:   Polly Cobia, RN 1/12/20179:08 AM  Other:  Carmell Austria, [place] 1/12/20179:08 AM  Other:   1/12/20179:08 AM  Other:   1/12/20179:08 AM  Other:  1/12/20179:08 AM  Other:  1/12/20179:08 AM  Other:  1/12/20179:08 AM  Other:  1/12/20179:08 AM  Other:  1/12/20179:08 AM  Other:  1/12/20179:08 AM  Other:   1/12/20179:08 AM     Scribe for Treatment Team:   Keene Breath, MSW, LCSWA [phone] 12/19/2015, 3:43 PM

## 2015-12-19 NOTE — [facility] Group Notes (Signed)
[place] Group Notes:  (Nursing/MHT/Case Management/Adjunct)  Date:  12/19/2015  Time:  2:00 PM  Type of Therapy:  Psychoeducational Skills  Participation Level:  Minimal  Participation Quality:  Appropriate and Attentive  Affect:  Flat  Cognitive:  Oriented  Insight:  Appropriate  Engagement in Group:  Engaged  Modes of Intervention:  Discussion and Education  Summary of Progress/Problems:  Drake Leach 12/19/2015, 2:00 PM

## 2015-12-19 NOTE — [facility] Group Notes (Signed)
[place] Group Notes:  (Nursing/MHT/Case Management/Adjunct)  Date:  12/19/2015  Time:  1:03 AM  Type of Therapy:  Group Therapy  Participation Level:  Did Not Attend   Summary of Progress/Problems:  Carla Thompson 12/19/2015, 1:03 AM

## 2015-12-19 NOTE — [facility] Group Notes (Signed)
[place] Group Notes:  (Nursing/MHT/Case Management/Adjunct)  Date:  12/19/2015  Time:  9:47 PM  Type of Therapy:  Group Therapy  Participation Level:  Active  Participation Quality:  Appropriate  Affect:  Appropriate  Cognitive:  Appropriate  Insight:  Appropriate  Engagement in Group:  Engaged  Modes of Intervention:  Education  Summary of Progress/Problems:  Kandis Fantasia 12/19/2015, 9:47 PM

## 2015-12-19 NOTE — Plan of Care (Signed)
Problem: Alteration in thought process Goal: LTG-Patient is able to perceive the environment accurately Outcome: Progressing Patient is alert and oriented and participating in her treatment plan currently.

## 2015-12-19 NOTE — Progress Notes (Signed)
Recreation Therapy Notes  Date: 01.17.17 Time: 3:00 pm Location: Craft Room  Group Topic: Self-expression  Goal Area(s) Addresses:  Patient will be able to identify a color that represents each emotion. Patient will verbalize benefit of using art as a means of self-expression. Patient will verbalize one positive emotion experienced while participating in the activity.  Behavioral Response: Attentive, Interactive   Intervention: The Colors Within Me  Activity: Patients were given a blank face worksheet and instructed to analyze the emotions they were experiencing, pick a color for each emotion, and show on the face how much of that emotion they were experiencing.  Education: LRT educated patients on different forms of self-expression.  Education Outcome: In group clarification offered   Clinical Observations/Feedback: Patient completed activity by picking a color for each emotion and showing how much of that emotion she was experiencing on the worksheet. Patient contributed to group discussion by stating what emotions she was experiencing and what emotions she experienced during group.  Leonette Monarch, LRT/CTRS 12/19/2015 4:27 PM

## 2015-12-19 NOTE — Progress Notes (Signed)
D:  Patient is alert and oriented today on the unit.  Patient appears more willing and able to attend to her ADL's today on the unit.  Patient reports that her appetite and sleep are good today.  Patient reports that her concentration is poor and she rates her depression a "7"  She also rates her anxiety at a "7"  Patient reports she has ear pain and a problem with her feet today.  Patient's goal for today was to attend at least 2 groups.  Patient attended and minimally participated in groups today.  Patient denies suicidal ideation, homicidal ideation, auditory or visual hallucinations currently.   A:  Scheduled medications were administered to patient as per MD orders.  Emotional support and encouragement were provided.  Patient is maintained on q.15 minute safety checks.  Patient is informed to notify staff with any questions or concerns.   R:  No adverse medication reactions were noted.  Patient is cooperative with medication administration and treatment plan.  Patient is receptive, calm and cooperative on the unit today.  Patient interacts minimally with others on the unit.  Patient contracts for safety at this time.  Patient remains safe at this time.

## 2015-12-20 LAB — CLOZAPINE (CLOZARIL)
CLOZAPINE LVL: 301 ng/mL — AB (ref 350–650)
NORCLOZAPINE: 158 ng/mL
Total(Cloz+Norcloz): 459 ng/mL

## 2015-12-20 MED ORDER — METOPROLOL TARTRATE 25 MG PO TABS: 12.5000 mg | ORAL_TABLET | Freq: Two times a day (BID) | ORAL | Status: DC

## 2015-12-20 MED ORDER — ROPINIROLE HCL 1 MG PO TABS: 1.0000 mg | ORAL_TABLET | Freq: Every day | ORAL | Status: DC

## 2015-12-20 MED ORDER — DIVALPROEX SODIUM ER 500 MG PO TB24: 2000.0000 mg | ORAL_TABLET | Freq: Every day | ORAL | Status: DC

## 2015-12-20 NOTE — Progress Notes (Signed)
Patient ID: Carla Thompson, female   DOB: 20-Sep-1961, 55 y.o.   MRN: [id number] Patient is pleasant and cooperative at d/c process. Although she states she feels ill-prepared for d/c, she denies SI/HI/AVH.  She has been compliant with her scheduled medications and verbalizes understanding of home meds.  Along with her spouse, she verbalizes f/u instructions for care.  All belongings returned.  Declines a patient survey.  Ambulatory to lobby with spouse.

## 2015-12-20 NOTE — Progress Notes (Signed)
D: Patient is very somatic today. When asking questions about her medication she states, "what would you do?" She denies SI/HI/AVH. She states pain in her neck. She stated she had a shower today without assistance. She said she was worried about falling even though she had a shower chair.  A: Medication was given with education. Encouragement was provided.  R: Patient was compliant with medication. She has remained calm and cooperative. Safety maintained with 15 min checks.

## 2015-12-20 NOTE — Plan of Care (Signed)
Problem: Alteration in thought process Goal: STG-Patient is able to sleep at least 6 hours per night Outcome: Progressing Patient slept 8 hours on previous shift.

## 2015-12-20 NOTE — [facility] Suicide Risk Assessment (Signed)
[place] INPATIENT:  Family/Significant Other Suicide Prevention Education  Suicide Prevention Education:  Education Completed; [name] (husband) [phone] has been identified by the patient as the family member/significant other with whom the patient will be residing, and identified as the person(s) who will aid the patient in the event of a mental health crisis (suicidal ideations/suicide attempt).  With written consent from the patient, the family member/significant other has been provided the following suicide prevention education, prior to the and/or following the discharge of the patient.  The suicide prevention education provided includes the following:  Suicide risk factors  Suicide prevention and interventions  National Suicide Hotline telephone number  [facility] assessment telephone number  [facility] Emergency Assistance [place] and/or Residential Mobile Crisis Unit telephone number  Request made of family/significant other to:  Remove weapons (e.g., guns, rifles, knives), all items previously/currently identified as safety concern.    Remove drugs/medications (over-the-counter, prescriptions, illicit drugs), all items previously/currently identified as a safety concern.  The family member/significant other verbalizes understanding of the suicide prevention education information provided.  The family member/significant other agrees to remove the items of safety concern listed above.  Keene Breath, MSW, [place] 12/20/2015, 3:03 PM

## 2015-12-20 NOTE — Discharge Summary (Signed)
Physician Discharge Summary Note  Patient:  Carla Thompson is an 55 y.o., female MRN:  [id number] DOB:  [birth year] Patient phone:  [phone] (home)  Patient address:   [place] [address],  Total Time spent with patient: 30 minutes  Date of Admission:  12/11/2015 Date of Discharge: 12/20/2015  Reason for Admission:  Psychotic break.  Identifying data. Carla Thompson is a 55 year old female with history of bipolar disorder  Chief complaint. "I think I am dead."  History of present illness.Information was obtained from the patient and the chart. The patient has a long history of bipolar illness. She has been maintained on clozapine with excellent results. Every 2 years however she seems to have a manic episode with psychosis, thought disorganization and bizarre behaviors. His last hospitalization aliments [facility] was in 2014 for similar presentation. At the patient was brought to the hospital by her husband reported that the patient has been sleeping poorly and behaving strangely. She is religiously preoccupied. She is disorganized and unable to keep up with the housework and medications. In the hospital she is extremely somatic. She feels that she is dead or maybe dead soon. She complains about his skin feeling that the veins are going to disappear. She has not had a bowel movement and believes that this needs to be taken care off immediately or she would die. She was examined in the emergency room and no impaction was found. She is extremely suspicious of use in staff on the unit. It takes a lot of encouragement to give her medications. She seems very confused about the circumstances of her admission. She has a very hard time naming her medications. She denies auditory or visual hallucinations. She feels very anxious about her surroundings and would like to return home as soon as possible. There is no involvement with drugs or alcohol.  Past psychiatric history.  Long history of bipolar disorder with multiple psychiatric hospitalizations over 10 of them. She was tried on multiple medications but Clozaril works well for her. Her husband reports good medication compliance. There were no suicide attempts.  Family psychiatric history. Nonreported.  Social history. The patient is married and lives with her husband. 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 She has 2 daughters ages 66 and 56 who are now independent. She enjoys reading.  Principal Problem: Schizoaffective disorder, bipolar type [facility]) Discharge Diagnoses: Patient Active Problem List   Diagnosis Date Noted  . Other long term (current) drug therapy [Z79.899] 12/08/2015  . Chronic constipation [K59.00] 12/08/2015  . Insomnia, persistent [G47.00] 12/08/2015  . 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 Gravida 2 para 2 [Z33.1] 12/08/2015  . Blood glucose elevated [R73.9] 12/08/2015  . Hypertriglyceridemia [E78.1] 12/08/2015  . Anemia, iron deficiency [D50.9] 12/08/2015  . Metallic taste 0000000 [id number]  . 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 Cutaneous eruption [R21] 12/08/2015  . Schizoaffective disorder, bipolar type ([place]) [F25.0] 12/08/2015    Past Psychiatric History: Bipolar disorder.  Past Medical History:  Past Medical History  Diagnosis Date  . Dermatitis   . Bowel incontinence   . Hypertriglyceridemia   . Chronic constipation   . High risk medication use   . Abnormal perimenopausal bleeding   . Hyperglycemia   . Metallic taste   . Schizoaffective disorder, bipolar type (HCC)     Dr. Tyrone Sage  . 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 Tubal ligation  12/02/1994  . Anus surgery  2000    [place]   Family History:  Family History  Problem Relation Age of Onset  . Mental illness Sister     Schizophrenia and bipolar  . Cancer Maternal Grandmother     Colon  .  Mental illness Paternal Grandmother     Schizophrenia   Family Psychiatric  History: None reported. Social History:  History  Alcohol Use No     History  Drug Use No    Social History   Social History  . Marital Status: Married    Spouse Name: N/A  . Number of Children: N/A  . Years of Education: N/A   Social History Main Topics  . Smoking status: Never Smoker   . Smokeless tobacco: None  . Alcohol Use: No  . Drug Use: No  . Sexual Activity: Not Currently   Other Topics Concern  . None   Social History Narrative    Hospital Course:    Carla Thompson is a 55 year old female with history of bipolar disorder admitted for a psychotic break in the context of good medication compliance.  1. Psychosis. We continued Clozaril for psychosis and added Depakote for mood stabilization. VPA level 89 on 1/14.   2. Anxiety. We continued clonazepam 0.5 mg at bedtime. The patient has symptoms suggestive of OCD but she is unwilling to change any of her medications.  3. Constipation. She was on bowel regimen. The patient requested lactose-free diet.  4. Insomnia.  This has resolved with the low-dose clonazepam at night.   5. Metabolic syndrome. Lipid profile, hemoglobin A1c and TSH are normal.  6. Head CT scan after a fall was negative.  7. PT. The patient was given a walker while in the hospital. PT assessment is greatly appreciated.   8. UTI. She was given a dose of fosfomycin.   9. Hearing problems. We started Debrox.  10. Tachycardia: Heart rate was elevated most likely secondary to Clozaril. We started metoprolol.   11. Restless legs. We offered the liquid.   12. Disposition. She was discharged to home with family. She will follow up with her regular psychiatrist.  Physical Findings: AIMS: Facial and Oral Movements Muscles of Facial Expression: None, normal Lips and Perioral Area: None, normal Jaw: None, normal Tongue: None, normal,Extremity Movements Upper (arms,  wrists, hands, fingers): None, normal Lower (legs, knees, ankles, toes): None, normal, Trunk Movements Neck, shoulders, hips: None, normal, Overall Severity Severity of abnormal movements (highest score from questions above): None, normal Incapacitation due to abnormal movements: None, normal Patient's awareness of abnormal movements (rate only patient's report): No Awareness, Dental Status Current problems with teeth and/or dentures?: No Does patient usually wear dentures?: No  CIWA:    COWS:     Musculoskeletal: Strength & Muscle Tone: within normal limits Gait & Station: normal Patient leans: N/A  Psychiatric Specialty Exam: Review of Systems  HENT: Positive for hearing loss.   Gastrointestinal: Positive for diarrhea and constipation.  Psychiatric/Behavioral: The patient is nervous/anxious.   All other systems reviewed and are negative.   Blood pressure 102/56, pulse 100, temperature 97.8 F (36.6 C), temperature source Oral, resp. rate 20, height 5\' 4"  (1.626 m), weight 62.143 kg (137 lb), last menstrual period 12/11/2015, SpO2 98 %.Body mass index is 23.5 kg/(m^2).  See SRA.  Sleep:  Number of Hours: 6.25   Have you used any form of tobacco in the last 30 days? (Cigarettes, Smokeless Tobacco, Cigars, and/or Pipes): No  Has this patient used any form of tobacco in the last 30 days? (Cigarettes, Smokeless Tobacco, Cigars, and/or Pipes) Yes, No  Metabolic Disorder Labs:  Lab Results  Component Value Date   HGBA1C 5.0 12/12/2015   No results found for: PROLACTIN Lab Results  Component Value Date   CHOL 172 12/12/2015   TRIG 74 12/12/2015   HDL 73 12/12/2015   CHOLHDL 2.4 12/12/2015   VLDL 15 12/12/2015   LDLCALC 84 12/12/2015   LDLCALC 86 12/11/2015    See Psychiatric Specialty Exam and Suicide Risk Assessment completed by Attending Physician prior to discharge.  Discharge destination:   Home  Is patient on multiple antipsychotic therapies at discharge:  No   Has Patient had three or more failed trials of antipsychotic monotherapy by history:  No  Recommended Plan for Multiple Antipsychotic Therapies: NA  Discharge Instructions    Diet - low sodium heart healthy    Complete by:  As directed      Increase activity slowly    Complete by:  As directed             Medication List    TAKE these medications      Indication   clonazePAM 0.5 MG tablet  Commonly known as:  KLONOPIN  Take 0.5 mg by mouth at bedtime.      cloZAPine 100 MG tablet  Commonly known as:  CLOZARIL  Take 100-300 mg by mouth 2 (two) times daily. Pt takes one tablet in the morning and three tablets at bedtime.      divalproex 500 MG 24 hr tablet  Commonly known as:  DEPAKOTE ER  Take 4 tablets (2,000 mg total) by mouth at bedtime.   Indication:  Manic Phase of Manic-Depression     Linaclotide 145 MCG Caps capsule  Commonly known as:  LINZESS  Take 1 capsule (145 mcg total) by mouth daily.      Magnesium Gluconate 250 MG Tabs  Take 250 mg by mouth daily.      Melatonin 5 MG Tabs  Take 5 mg by mouth at bedtime as needed (for sleep).      metoprolol tartrate 25 MG tablet  Commonly known as:  LOPRESSOR  Take 0.5 tablets (12.5 mg total) by mouth 2 (two) times daily.   Indication:  High Blood Pressure     ranitidine 150 MG tablet  Commonly known as:  ZANTAC  Take 1 tablet (150 mg total) by mouth 2 (two) times daily.      rOPINIRole 1 MG tablet  Commonly known as:  REQUIP  Take 1 tablet (1 mg total) by mouth at bedtime.   Indication:  Restless Leg Syndrome     STOOL SOFTENER 100 MG capsule  Generic drug:  docusate sodium  Take 100 mg by mouth 2 (two) times daily as needed for mild constipation.      SUPER B COMPLEX PO  Take 1 tablet by mouth daily.      vitamin C 500 MG tablet  Commonly known as:  ASCORBIC ACID  Take 500 mg by mouth daily.      vitamin E 1000 UNIT capsule   Take 1,000 Units by mouth daily.            Follow-up Information    Follow up with [facility]. Go on  12/22/2015.   Why:  For follow-up care appt Friday 12/22/15 at 11:30am   Contact information:   [place], [place] Ph [place] Fax [phone]       Follow-up recommendations:  Activity:  As tolerated. Diet:  Low sodium heart healthy. Other:  Keep follow-up appointments.  Comments:    Signed: Jolanta Pucilowska 12/20/2015, 10:31 AM

## 2015-12-20 NOTE — [facility] Suicide Risk Assessment (Signed)
[facility] Discharge Suicide Risk Assessment   Principal Problem: Schizoaffective disorder, bipolar type [facility]) Discharge Diagnoses:  Patient Active Problem List   Diagnosis Date Noted  . Other long term (current) drug therapy [Z79.899] 12/08/2015  . Chronic constipation [K59.00] 12/08/2015  . Insomnia, persistent [G47.00] 12/08/2015  . 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 Gravida 2 para 2 [Z33.1] 12/08/2015  . Blood glucose elevated [R73.9] 12/08/2015  . Hypertriglyceridemia [E78.1] 12/08/2015  . Anemia, iron deficiency [D50.9] 12/08/2015  . Metallic taste 0000000 [id number]  . 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 Cutaneous eruption [R21] 12/08/2015  . Schizoaffective disorder, bipolar type ([place]) [F25.0] 12/08/2015    Total Time spent with patient: 30 minutes  Musculoskeletal: Strength & Muscle Tone: within normal limits Gait & Station: normal Patient leans: N/A  Psychiatric Specialty Exam: Review of Systems  HENT: Positive for hearing loss.   Gastrointestinal: Positive for diarrhea and constipation.  Psychiatric/Behavioral: The patient is nervous/anxious.   All other systems reviewed and are negative.   Blood pressure 102/56, pulse 100, temperature 97.8 F (36.6 C), temperature source Oral, resp. rate 20, height 5\' 4"  (1.626 m), weight 62.143 kg (137 lb), last menstrual period 12/11/2015, SpO2 98 %.Body mass index is 23.5 kg/(m^2).  General Appearance: Casual  Eye Contact::  Good  Speech:  Clear and [id number]  Volume:  Normal  Mood:  Anxious  Affect:  Flat  Thought Process:  Goal Directed  Orientation:  Full (Time, Place, and Person)  Thought Content:  Delusions and Paranoid Ideation  Suicidal Thoughts:  No  Homicidal Thoughts:  No  Memory:  Immediate;   Fair Recent;   Fair Remote;   Fair  Judgement:  Fair  Insight:  Shallow  Psychomotor Activity:  Normal  Concentration:  Fair  Recall:  [place]  Language: Fair  Akathisia:   No  Handed:  Right  AIMS (if indicated):     Assets:  Communication Skills Desire for Improvement Financial Resources/Insurance Housing Intimacy Physical Health Resilience Social Support  Sleep:  Number of Hours: 6.25  Cognition: WNL  ADL's:  Intact   Mental Status Per Nursing Assessment::   On Admission:  NA  Demographic Factors:  Caucasian  Loss Factors: NA  Historical Factors: NA  Risk Reduction Factors:   Sense of responsibility to family, Living with another person, especially a relative, Positive social support and Positive therapeutic relationship  Continued Clinical Symptoms:  Bipolar Disorder:   Mixed State  Cognitive Features That Contribute To Risk:  None    Suicide Risk:  Minimal: No identifiable suicidal ideation.  Patients presenting with no risk factors but with morbid ruminations; may be classified as minimal risk based on the severity of the depressive symptoms  Follow-up Information    Follow up with [facility]. Go on 12/22/2015.   Why:  For follow-up care appt Friday 12/22/15 at 11:30am   Contact information:   [place], [place] Ph [place] Fax [phone]       Plan Of Care/Follow-up recommendations:  Activity:  As tolerated. Diet:  Low sodium heart healthy. Other:  Keep follow-up appointments.  Orson Slick, MD 12/20/2015, 10:28 AM

## 2015-12-20 NOTE — Tx Team (Signed)
Interdisciplinary Treatment Plan Update (Adult)  Date:  12/20/2015 Time Reviewed:  3:04 PM  Progress in Treatment: Attending groups: Yes. Participating in groups:  Yes. Taking medication as prescribed:  Yes. Tolerating medication:  Yes. Family/Significant othe contact made:  Yes, individual(s) contacted:  patient's husband Patient understands diagnosis:  No. Discussing patient identified problems/goals with staff:  Yes. Medical problems stabilized or resolved:  Yes. Denies suicidal/homicidal ideation: Yes. Issues/concerns per patient self-inventory:  No. Other:  New problem(s) identified: No, Describe:  none reported  Discharge Plan or Barriers: Patient will stabilize on medications for psychosis and discharge home with her husband. Patient will continue follow up with her outpatient provider in [facility].  Reason for Continuation of Hospitalization: Medication stabilization Other; describe pschosis  Comments:  Estimated length of stay: will discharge today Wednesday 12/20/15  New goal(s):  Review of initial/current patient goals per problem list:   1.  Goal(s):participate in care planning  Met:  Yes  Target date:by discharge  As evidenced OF:VWAQLRJ planning aftercare follow up and arrangements for discahrge  2.  Goal (s):decrease psychosis  Met:  Yes  Target date:by discharge  As evidenced PV:GKKDPTE symptoms of psychosis decreased  Attendees: Physician:  Orson Slick, MD 1/12/20179:08 AM  Nursing:   Polly Cobia, RN 1/12/20179:08 AM  Other:  Carmell Austria, [place] 1/12/20179:08 AM  Other:   1/12/20179:08 AM  Other:   1/12/20179:08 AM  Other:  1/12/20179:08 AM  Other:  1/12/20179:08 AM  Other:  1/12/20179:08 AM  Other:  1/12/20179:08 AM  Other:  1/12/20179:08 AM  Other:  1/12/20179:08 AM  Other:   1/12/20179:08 AM     Scribe for Treatment Team:   Keene Breath, MSW, LCSWA [phone] 12/20/2015, 3:04 PM

## 2015-12-20 NOTE — Progress Notes (Signed)
Recreation Therapy Notes  INPATIENT RECREATION TR PLAN  Patient Details Name: SREEJA SPIES MRN: [id number] DOB: July 25, 1961 Today's Date: 12/20/2015  Rec Therapy Plan Is patient appropriate for Therapeutic Recreation?: Yes Treatment times per week: At least once a week TR Treatment/Interventions: 1:1 session, Group participation (Comment) (Appropriate participation in daily recreation therapy tx)  Discharge Criteria Pt will be discharged from therapy if:: Treatment goals are met, Discharged Treatment plan/goals/alternatives discussed and agreed upon by:: Patient/family  Discharge Summary Short term goals set: See Care Plan Short term goals met: Complete Progress toward goals comments: One-to-one attended Which groups?: Other (Comment), Wellness, Goal setting (Self-expression) One-to-one attended: Self-esteem, stress management Reason goals not met: N/A Therapeutic equipment acquired: None Reason patient discharged from therapy: Discharge from hospital Pt/family agrees with progress & goals achieved: Yes Date patient discharged from therapy: 12/20/15   Leonette Monarch, LRT/CTRS 12/20/2015, 12:18 PM

## 2015-12-20 NOTE — Progress Notes (Signed)
  [facility] Adult Case Management Discharge Plan :  Will you be returning to the same living situation after discharge:  Yes,  home with husband At discharge, do you have transportation home?: Yes,  patient's husband will pick up Do you have the ability to pay for your medications: Yes,  patient has insurance  Release of information consent forms completed and in the chart;  Patient's signature needed at discharge.  Patient to Follow up at: Follow-up Information    Follow up with [facility]. Go on 12/22/2015.   Why:  For follow-up care appt Friday 12/22/15 at 11:30am   Contact information:   [place], [place] Ph [phone] Fax [phone]       Next level of care provider has access to [place] and Suicide Prevention discussed: Yes,  SPE discussed with patient and [name] (husband) [phone]  Have you used any form of tobacco in the last 30 days? (Cigarettes, Smokeless Tobacco, Cigars, and/or Pipes): No  Has patient been referred to the Quitline?: N/A patient is not a smoker  Patient has been referred for addiction treatment: [place], [place], MSW, LCSWA [phone] 12/20/2015, 3:05 PM

## 2015-12-20 NOTE — Plan of Care (Signed)
Problem: [facility] Participation in Recreation Therapeutic Interventions Goal: STG-Patient will demonstrate improved self esteem by identif STG: Self-Esteem - Within 3 treatment sessions, patient will verbalize at least 5 positive affirmation statements in one treatment session to increase self-esteem post d/c.  Outcome: Completed/Met Date Met:  12/20/15 Treatment Session 1; Completed 1 out of 1: At approximately 11:05 am, LRT met with patient in patient room. Patient verbalized 5 positive affirmation statements. Patient reported it felt "wonderful". LRT encouraged patient to continue saying positive affirmation statements. Intervention Used: I Am statements  Leonette Monarch, LRT/CTRS 01.18.17 12:15 pm Goal: STG-Other Recreation Therapy Goal (Specify) STG: Stress Management - Within 3 treatment sessions, patient will verbalize understanding of the stress management techniques in one treatment session to increase stress management skills post d/c.  Outcome: Completed/Met Date Met:  12/20/15 Treatment Session 1; Completed 1 out of 1: At approximately 11:30 am, LRT met with patient in patient room. LRT educated and provided patient with handouts on stress management techniques. Patient verbalized understanding. LRT encouraged patient to read over and practice the stress management techniques. Intervention Used: Stress Management handouts  Leonette Monarch, LRT/CTRS 01.18.17 12:17 pm

## 2015-12-21 ENCOUNTER — Encounter: Payer: Self-pay | Admitting: Family Medicine

## 2015-12-21 ENCOUNTER — Ambulatory Visit (INDEPENDENT_AMBULATORY_CARE_PROVIDER_SITE_OTHER): Payer: Managed Care, Other (non HMO) | Admitting: Family Medicine

## 2015-12-21 VITALS — BP 110/64 | HR 100 | Temp 98.7°F | Resp 12 | Ht 66.0 in | Wt 134.9 lb

## 2015-12-21 DIAGNOSIS — F29 Unspecified psychosis not due to a substance or known physiological condition: Secondary | ICD-10-CM

## 2015-12-21 DIAGNOSIS — K589 Irritable bowel syndrome without diarrhea: Secondary | ICD-10-CM | POA: Diagnosis not present

## 2015-12-21 DIAGNOSIS — F250 Schizoaffective disorder, bipolar type: Secondary | ICD-10-CM | POA: Diagnosis not present

## 2015-12-21 DIAGNOSIS — Z09 Encounter for follow-up examination after completed treatment for conditions other than malignant neoplasm: Secondary | ICD-10-CM | POA: Diagnosis not present

## 2015-12-21 DIAGNOSIS — K5900 Constipation, unspecified: Secondary | ICD-10-CM | POA: Diagnosis not present

## 2015-12-21 DIAGNOSIS — K5909 Other constipation: Secondary | ICD-10-CM

## 2015-12-21 DIAGNOSIS — H6122 Impacted cerumen, left ear: Secondary | ICD-10-CM

## 2015-12-21 DIAGNOSIS — G2581 Restless legs syndrome: Secondary | ICD-10-CM | POA: Insufficient documentation

## 2015-12-21 DIAGNOSIS — F23 Brief psychotic disorder: Secondary | ICD-10-CM

## 2015-12-21 DIAGNOSIS — K581 Irritable bowel syndrome with constipation: Secondary | ICD-10-CM | POA: Insufficient documentation

## 2015-12-21 NOTE — Progress Notes (Signed)
Name: Carla Thompson   MRN: [id number]    DOB: 12-17-1960   Date:12/21/2015       Progress Note  Subjective  Chief Complaint  Chief Complaint  Patient presents with  . Ear Pain    bilateral ear pain for about a week. right side is worst than the left. patient has tried otc drops. patient stated that it is hard to hear, but no pressure or drainage.     HPI  Hospital Discharge follow up: she was admitted to the hospital for a psychotic episode. She has a follow up with her psychiatrist tomorrow. Husband is here with her today. He states that she is much better, but not back to baseline. Seems to be asking the same questions, seems not to focus, she does not seem to be listening to him at times. Taking medications. New medication prior to discharge Requip for RLS and also Depakote. Explained that Depakote can cause mental fogginess, but she should discuss it with psychiatrist tomorrow.   IBS with predominant constipation: taking laxatives, also has Miralax at home, she has been having soiling lately, intermittent abdominal cramping. No blood in stools.   RLS: not sure if that is the correct diagnosis, she has a similar episode when she was manic years ago, she states there is no pain , just the need to move her legs.   Otalgia: she has ear fulness on both sides for the past week, she has a history of wax impactions  Patient Active Problem List   Diagnosis Date Noted  . RLS (restless legs syndrome) 12/21/2015  . IBS (irritable bowel syndrome) 12/21/2015  . Other long term (current) drug therapy 12/08/2015  . Chronic constipation 12/08/2015  . Insomnia, persistent 12/08/2015  . Dermatitis, eczematoid 12/08/2015  . Gravida 2 para 2 12/08/2015  . Blood glucose elevated 12/08/2015  . Hypertriglyceridemia 12/08/2015  . Anemia, iron deficiency 12/08/2015  . Metallic taste 48/54/6270  . 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 Cutaneous eruption 12/08/2015  . Schizoaffective disorder,  bipolar type ([place]) 12/08/2015    Past Surgical History  Procedure Laterality Date  . Tubal ligation  12/02/1994  . Anus surgery  2000    [place]    Family History  Problem Relation Age of Onset  . Mental illness Sister     Schizophrenia and bipolar  . Cancer Maternal Grandmother     Colon  . Mental illness Paternal Grandmother     Schizophrenia    Social History   Social History  . Marital Status: Married    Spouse Name: N/A  . Number of Children: N/A  . Years of Education: N/A   Occupational History  . Not on file.   Social History Main Topics  . Smoking status: Never Smoker   . Smokeless tobacco: Not on file  . Alcohol Use: No  . Drug Use: No  . Sexual Activity: Not Currently   Other Topics Concern  . Not on file   Social History Narrative     Current outpatient prescriptions:  .  B Complex-C (SUPER B COMPLEX PO), Take 1 tablet by mouth daily., Disp: , Rfl:  .  clonazePAM (KLONOPIN) 0.5 MG tablet, Take 0.5 mg by mouth at bedtime. , Disp: , Rfl:  .  cloZAPine (CLOZARIL) 100 MG tablet, Take 100-300 mg by mouth 2 (two) times daily. Pt takes one tablet in the morning and three tablets at bedtime., Disp: , Rfl:  .  divalproex (DEPAKOTE ER) 500 MG 24 hr tablet,  Take 4 tablets (2,000 mg total) by mouth at bedtime., Disp: 120 tablet, Rfl: 0 .  docusate sodium (STOOL SOFTENER) 100 MG capsule, Take 100 mg by mouth 2 (two) times daily as needed for mild constipation. , Disp: , Rfl:  .  Linaclotide (LINZESS) 145 MCG CAPS capsule, Take 1 capsule (145 mcg total) by mouth daily., Disp: 30 capsule, Rfl: 2 .  Magnesium Gluconate 250 MG TABS, Take 250 mg by mouth daily. , Disp: , Rfl:  .  Melatonin 5 MG TABS, Take 5 mg by mouth at bedtime as needed (for sleep)., Disp: , Rfl:  .  metoprolol tartrate (LOPRESSOR) 25 MG tablet, Take 0.5 tablets (12.5 mg total) by mouth 2 (two) times daily., Disp: 30 tablet, Rfl: 0 .  ranitidine (ZANTAC) 150 MG tablet, Take 1 tablet (150 mg  total) by mouth 2 (two) times daily., Disp: 60 tablet, Rfl: 3 .  rOPINIRole (REQUIP) 1 MG tablet, Take 1 tablet (1 mg total) by mouth at bedtime., Disp: 30 tablet, Rfl: 0 .  vitamin C (ASCORBIC ACID) 500 MG tablet, Take 500 mg by mouth daily., Disp: , Rfl:  .  vitamin E 1000 UNIT capsule, Take 1,000 Units by mouth daily. , Disp: , Rfl:   Allergies  Allergen Reactions  . Escitalopram Other (See Comments)    Reaction:  Unknown   . Levsin [Hyoscyamine Sulfate] Other (See Comments)    Reaction:  Unknown      ROS  Ten systems reviewed and is negative except as mentioned in HPI  Objective  Filed Vitals:   12/21/15 1410  BP: 110/64  Pulse: 100  Temp: 98.7 F (37.1 C)  TempSrc: Oral  Resp: 12  Height: 5' 6"  (1.676 m)  Weight: 134 lb 14.4 oz (61.19 kg)  SpO2: 97%    Body mass index is 21.78 kg/(m^2).  Physical Exam  Constitutional: Patient appears well-developed and well-nourished. No distress.  HEENT: head atraumatic, normocephalic, pupils equal and reactive to light, neck supple, throat within normal limits, cerumen impaction on left ear canal Cardiovascular: Normal rate, regular rhythm and normal heart sounds.  No murmur heard. No BLE edema. Pulmonary/Chest: Effort normal and breath sounds normal. No respiratory distress. Abdominal: Soft.  There is no tenderness. Psychiatric: hair is a little messy today, dressed appropriately, asking the same questions multiple times, moving legs - a little restless.    Recent Results (from the past 2160 hour(s))  CBC with Differential/Platelet     Status: None   Collection Time: 09/22/15  3:40 PM  Result Value Ref Range   WBC 6.8 3.6 - 11.0 K/uL   RBC 4.04 3.80 - 5.20 MIL/uL   Hemoglobin 12.2 12.0 - 16.0 g/dL   HCT 36.6 35.0 - 47.0 %   MCV 90.6 80.0 - 100.0 fL   MCH 30.3 26.0 - 34.0 pg   MCHC 33.4 32.0 - 36.0 g/dL   RDW 12.4 11.5 - 14.5 %   Platelets 229 150 - 440 K/uL   Neutrophils Relative % 60 %   Neutro Abs 4.2 1.4 - 6.5 K/uL    Lymphocytes Relative 30 %   Lymphs Abs 2.0 1.0 - 3.6 K/uL   Monocytes Relative 7 %   Monocytes Absolute 0.5 0.2 - 0.9 K/uL   Eosinophils Relative 2 %   Eosinophils Absolute 0.1 0 - 0.7 K/uL   Basophils Relative 1 %   Basophils Absolute 0.0 0 - 0.1 K/uL  CBC with Differential/Platelet     Status: None   Collection  Time: 11/01/15  1:05 PM  Result Value Ref Range   WBC 6.7 3.6 - 11.0 K/uL   RBC 4.21 3.80 - 5.20 MIL/uL   Hemoglobin 12.5 12.0 - 16.0 g/dL   HCT 38.2 35.0 - 47.0 %   MCV 90.7 80.0 - 100.0 fL   MCH 29.6 26.0 - 34.0 pg   MCHC 32.6 32.0 - 36.0 g/dL   RDW 12.5 11.5 - 14.5 %   Platelets 240 150 - 440 K/uL   Neutrophils Relative % 65 %   Neutro Abs 4.4 1.4 - 6.5 K/uL   Lymphocytes Relative 25 %   Lymphs Abs 1.7 1.0 - 3.6 K/uL   Monocytes Relative 7 %   Monocytes Absolute 0.5 0.2 - 0.9 K/uL   Eosinophils Relative 2 %   Eosinophils Absolute 0.2 0 - 0.7 K/uL   Basophils Relative 1 %   Basophils Absolute 0.0 0 - 0.1 K/uL  CBC with Differential/Platelet     Status: Abnormal   Collection Time: 11/27/15 12:03 PM  Result Value Ref Range   WBC 5.8 3.6 - 11.0 K/uL   RBC 4.02 3.80 - 5.20 MIL/uL   Hemoglobin 11.9 (L) 12.0 - 16.0 g/dL   HCT 36.5 35.0 - 47.0 %   MCV 90.8 80.0 - 100.0 fL   MCH 29.8 26.0 - 34.0 pg   MCHC 32.8 32.0 - 36.0 g/dL   RDW 12.9 11.5 - 14.5 %   Platelets 234 150 - 440 K/uL   Neutrophils Relative % 67 %   Neutro Abs 3.9 1.4 - 6.5 K/uL   Lymphocytes Relative 25 %   Lymphs Abs 1.5 1.0 - 3.6 K/uL   Monocytes Relative 5 %   Monocytes Absolute 0.3 0.2 - 0.9 K/uL   Eosinophils Relative 2 %   Eosinophils Absolute 0.1 0 - 0.7 K/uL   Basophils Relative 1 %   Basophils Absolute 0.0 0 - 0.1 K/uL  POC Hemoccult Bld/Stl (1-Cd Office Dx)     Status: Normal   Collection Time: 12/08/15  1:18 PM  Result Value Ref Range   Card #1 Date negative    Fecal Occult Blood, POC  Negative  Comprehensive metabolic panel     Status: Abnormal   Collection Time: 12/09/15   1:41 PM  Result Value Ref Range   Sodium 142 135 - 145 mmol/L   Potassium 4.6 3.5 - 5.1 mmol/L   Chloride 109 101 - 111 mmol/L   CO2 24 22 - 32 mmol/L   Glucose, Bld 132 (H) 65 - 99 mg/dL   BUN 10 6 - 20 mg/dL   Creatinine, Ser 0.89 0.44 - 1.00 mg/dL   Calcium 9.8 8.9 - 10.3 mg/dL   Total Protein 8.0 6.5 - 8.1 g/dL   Albumin 4.7 3.5 - 5.0 g/dL   AST 15 15 - 41 U/L   ALT 17 14 - 54 U/L   Alkaline Phosphatase 81 38 - 126 U/L   Total Bilirubin 0.6 0.3 - 1.2 mg/dL   GFR calc non Af Amer >60 >60 mL/min   GFR calc Af Amer >60 >60 mL/min    Comment: (NOTE) The eGFR has been calculated using the CKD EPI equation. This calculation has not been validated in all clinical situations. eGFR's persistently <60 mL/min signify possible Chronic Kidney Disease.    Anion gap 9 5 - 15  Ethanol (ETOH)     Status: None   Collection Time: 12/09/15  1:41 PM  Result Value Ref Range   Alcohol, Ethyl (  B) <5 <5 mg/dL    Comment:        LOWEST DETECTABLE LIMIT FOR SERUM ALCOHOL IS 5 mg/dL FOR MEDICAL PURPOSES ONLY   Salicylate level     Status: None   Collection Time: 12/09/15  1:41 PM  Result Value Ref Range   Salicylate Lvl <3.8 2.8 - 30.0 mg/dL  Acetaminophen level     Status: Abnormal   Collection Time: 12/09/15  1:41 PM  Result Value Ref Range   Acetaminophen (Tylenol), Serum <10 (L) 10 - 30 ug/mL    Comment:        THERAPEUTIC CONCENTRATIONS VARY SIGNIFICANTLY. A RANGE OF 10-30 ug/mL MAY BE AN EFFECTIVE CONCENTRATION FOR MANY PATIENTS. HOWEVER, SOME ARE BEST TREATED AT CONCENTRATIONS OUTSIDE THIS RANGE. ACETAMINOPHEN CONCENTRATIONS >150 ug/mL AT 4 HOURS AFTER INGESTION AND >50 ug/mL AT 12 HOURS AFTER INGESTION ARE OFTEN ASSOCIATED WITH TOXIC REACTIONS.   CBC     Status: None   Collection Time: 12/09/15  1:41 PM  Result Value Ref Range   WBC 10.1 3.6 - 11.0 K/uL   RBC 4.61 3.80 - 5.20 MIL/uL   Hemoglobin 13.8 12.0 - 16.0 g/dL   HCT 41.9 35.0 - 47.0 %   MCV 91.0 80.0 - 100.0 fL    MCH 30.0 26.0 - 34.0 pg   MCHC 33.0 32.0 - 36.0 g/dL   RDW 13.2 11.5 - 14.5 %   Platelets 339 150 - 440 K/uL  Differential     Status: Abnormal   Collection Time: 12/09/15  1:41 PM  Result Value Ref Range   Neutrophils Relative % 79 %   Neutro Abs 7.9 (H) 1.4 - 6.5 K/uL   Lymphocytes Relative 14 %   Lymphs Abs 1.4 1.0 - 3.6 K/uL   Monocytes Relative 7 %   Monocytes Absolute 0.7 0.2 - 0.9 K/uL   Eosinophils Relative 1 %   Eosinophils Absolute 0.0 0 - 0.7 K/uL   Basophils Relative 1 %   Basophils Absolute 0.1 0 - 0.1 K/uL  TSH     Status: None   Collection Time: 12/10/15  2:28 PM  Result Value Ref Range   TSH 1.831 0.350 - 4.500 uIU/mL  Hemoglobin A1c     Status: None   Collection Time: 12/10/15  2:28 PM  Result Value Ref Range   Hgb A1c MFr Bld 5.2 4.0 - 6.0 %  CK isoenzymes (brain, muscle injury)     Status: Abnormal   Collection Time: 12/10/15  2:28 PM  Result Value Ref Range   CK-MM 100 97 - 100 %   CK-MB 0 0 - 3 %   CK-BB 0 0 %    Comment: (NOTE) Performed At: [facility] [place], [place] [id number] Lindon Romp MD ZP:[id number]    Creatine Kinase-Total 230 (H) 24 - 173 U/L   Macro Type 1 0 Not Observed %   Macro Type 2 0 Not Observed %  Lipid panel     Status: None   Collection Time: 12/11/15 10:14 AM  Result Value Ref Range   Cholesterol 182 0 - 200 mg/dL   Triglycerides 80 <150 mg/dL   HDL 80 >40 mg/dL   Total CHOL/HDL Ratio 2.3 RATIO   VLDL 16 0 - 40 mg/dL   LDL Cholesterol 86 0 - 99 mg/dL    Comment:        Total Cholesterol/HDL:CHD Risk Coronary Heart Disease Risk Table  Men   Women  1/2 Average Risk   3.4   3.3  Average Risk       5.0   4.4  2 X Average Risk   9.6   7.1  3 X Average Risk  23.4   11.0        Use the calculated Patient Ratio above and the CHD Risk Table to determine the patient's CHD Risk.        ATP III CLASSIFICATION (LDL):  <100     mg/dL   Optimal  100-129  mg/dL   Near or  Above                    Optimal  130-159  mg/dL   Borderline  160-189  mg/dL   High  >190     mg/dL   Very High   Hemoglobin A1c     Status: None   Collection Time: 12/12/15  6:37 AM  Result Value Ref Range   Hgb A1c MFr Bld 5.0 4.0 - 6.0 %  Lipid panel, fasting     Status: None   Collection Time: 12/12/15  6:37 AM  Result Value Ref Range   Cholesterol 172 0 - 200 mg/dL   Triglycerides 74 <150 mg/dL   HDL 73 >40 mg/dL   Total CHOL/HDL Ratio 2.4 RATIO   VLDL 15 0 - 40 mg/dL   LDL Cholesterol 84 0 - 99 mg/dL    Comment:        Total Cholesterol/HDL:CHD Risk Coronary Heart Disease Risk Table                     Men   Women  1/2 Average Risk   3.4   3.3  Average Risk       5.0   4.4  2 X Average Risk   9.6   7.1  3 X Average Risk  23.4   11.0        Use the calculated Patient Ratio above and the CHD Risk Table to determine the patient's CHD Risk.        ATP III CLASSIFICATION (LDL):  <100     mg/dL   Optimal  100-129  mg/dL   Near or Above                    Optimal  130-159  mg/dL   Borderline  160-189  mg/dL   High  >190     mg/dL   Very High   TSH     Status: None   Collection Time: 12/12/15  6:37 AM  Result Value Ref Range   TSH 2.228 0.350 - 4.500 uIU/mL  Glucose, capillary     Status: None   Collection Time: 12/13/15 12:37 AM  Result Value Ref Range   Glucose-Capillary 97 65 - 99 mg/dL  Urinalysis complete, with microscopic ([facility] only)     Status: Abnormal   Collection Time: 12/15/15  3:45 AM  Result Value Ref Range   Color, Urine YELLOW (A) YELLOW   APPearance CLOUDY (A) CLEAR   Glucose, UA NEGATIVE NEGATIVE mg/dL   Bilirubin Urine NEGATIVE NEGATIVE   Ketones, ur NEGATIVE NEGATIVE mg/dL   Specific Gravity, Urine 1.009 1.005 - 1.030   Hgb urine dipstick 3+ (A) NEGATIVE   pH 6.0 5.0 - 8.0   Protein, ur NEGATIVE NEGATIVE mg/dL   Nitrite POSITIVE (A) NEGATIVE   Leukocytes, UA 1+ (A) NEGATIVE   RBC /  HPF 6-30 0 - 5 RBC/hpf   WBC, UA 6-30 0 - 5 WBC/hpf    Bacteria, UA FEW (A) NONE SEEN   Squamous Epithelial / LPF 0-5 (A) NONE SEEN  Valproic acid level     Status: None   Collection Time: 12/16/15  6:59 AM  Result Value Ref Range   Valproic Acid Lvl 89 50.0 - 100.0 ug/mL  CBC with Differential/Platelet     Status: None   Collection Time: 12/16/15  6:59 AM  Result Value Ref Range   WBC 6.6 3.6 - 11.0 K/uL   RBC 4.25 3.80 - 5.20 MIL/uL   Hemoglobin 12.7 12.0 - 16.0 g/dL   HCT 38.5 35.0 - 47.0 %   MCV 90.6 80.0 - 100.0 fL   MCH 29.9 26.0 - 34.0 pg   MCHC 33.0 32.0 - 36.0 g/dL   RDW 13.0 11.5 - 14.5 %   Platelets 265 150 - 440 K/uL   Neutrophils Relative % 64 %   Neutro Abs 4.2 1.4 - 6.5 K/uL   Lymphocytes Relative 20 %   Lymphs Abs 1.3 1.0 - 3.6 K/uL   Monocytes Relative 8 %   Monocytes Absolute 0.5 0.2 - 0.9 K/uL   Eosinophils Relative 6 %   Eosinophils Absolute 0.4 0 - 0.7 K/uL   Basophils Relative 2 %   Basophils Absolute 0.1 0 - 0.1 K/uL  Clozapine (clozaril)     Status: Abnormal   Collection Time: 12/19/15  3:45 PM  Result Value Ref Range   Clozapine Lvl 301 (L) 350 - 650 ng/mL    Comment:               **Please note reference interval change**   NorClozapine 158 Not Estab. ng/mL   Total(Cloz+Norcloz) 459 ng/mL    Comment: (NOTE) Patients dosed with 400 mg clozapine daily for 4 weeks were most likely to exhibit a therapeutic effect when the sum of clozapine and norclozapine concentrations were at least 450 ng/mL. Vira Agar, et al. Rexford Maus Consensus Guidelines for Therapeutic Drug Monitoring in Psychiatry: Update 2011, Pharmacopsychiatry Sep 2011; 44(6):195-235.                                Detection Limit = 20 Performed At: [facility] [place], [place] [id number] Lindon Romp MD JO:[id number]      PHQ2/9: Depression screen [facility] 2/9 12/21/2015 12/08/2015  Decreased Interest 1 0  Down, Depressed, Hopeless 3 0  PHQ - 2 Score 4 0  Altered sleeping 3 -  Tired, decreased  energy 3 -  Change in appetite 2 -  Feeling bad or failure about yourself  1 -  Trouble concentrating 3 -  Moving slowly or fidgety/restless 2 -  Suicidal thoughts 0 -  PHQ-9 Score 18 -  Difficult doing work/chores Very difficult -     Fall Risk: Fall Risk  12/21/2015 12/08/2015  Falls in the past year? No No     Functional Status Survey: Is the patient deaf or have difficulty hearing?: No Does the patient have difficulty seeing, even when wearing glasses/contacts?: No Does the patient have difficulty concentrating, remembering, or making decisions?: Yes (Currently) Does the patient have difficulty walking or climbing stairs?: No Does the patient have difficulty dressing or bathing?: No    Assessment & Plan  1. IBS (irritable bowel syndrome)  We will try Linzess, prescription sent to pharmacy  2. Chronic constipation  Try Linzess  3. Schizoaffective disorder, bipolar type ([place])  Keep follow up with psychiatrist, she was in a manic episode and now seems to be getting more depressed  4. Psychotic episode  Doing better, discharged yesterday from [facility] mental health department  5. Hospital discharge follow-up  Keep follow up with Psychiatrist, advised to consider stopping Requip since I do not think she has RLS but restless from mania   6. Cerumen impaction, left  - EAR CERUMEN REMOVAL; Standing  Verbal consent given Possible side effects discussed with patient Ears were  lavaged with warm water and peroxide  Patient tolerated procedure well No complications

## 2015-12-26 ENCOUNTER — Other Ambulatory Visit
Admission: RE | Admit: 2015-12-26 | Discharge: 2015-12-26 | Disposition: A | Discharge status: Home / Self Care | Payer: Managed Care, Other (non HMO) | Source: Ambulatory Visit | Attending: Psychiatry | Admitting: Psychiatry

## 2015-12-26 DIAGNOSIS — Z79899 Other long term (current) drug therapy: Secondary | ICD-10-CM | POA: Diagnosis not present

## 2015-12-26 DIAGNOSIS — F200 Paranoid schizophrenia: Secondary | ICD-10-CM | POA: Diagnosis not present

## 2015-12-26 LAB — CBC WITH DIFFERENTIAL/PLATELET
BASOS ABS: 0 10*3/uL (ref 0–0.1)
Basophils Relative: 0 %
Eosinophils Absolute: 0 10*3/uL (ref 0–0.7)
Eosinophils Relative: 1 %
HEMATOCRIT: 35.1 % (ref 35.0–47.0)
Hemoglobin: 11.8 g/dL — ABNORMAL LOW (ref 12.0–16.0)
LYMPHS ABS: 1 10*3/uL (ref 1.0–3.6)
LYMPHS PCT: 12 %
MCH: 30.4 pg (ref 26.0–34.0)
MCHC: 33.7 g/dL (ref 32.0–36.0)
MCV: 90.1 fL (ref 80.0–100.0)
Monocytes Absolute: 0.6 10*3/uL (ref 0.2–0.9)
Monocytes Relative: 7 %
NEUTROS ABS: 6.8 10*3/uL — AB (ref 1.4–6.5)
Neutrophils Relative %: 80 %
Platelets: 184 10*3/uL (ref 150–440)
RBC: 3.9 MIL/uL (ref 3.80–5.20)
RDW: 13 % (ref 11.5–14.5)
WBC: 8.4 10*3/uL (ref 3.6–11.0)

## 2015-12-26 LAB — VALPROIC ACID LEVEL: Valproic Acid Lvl: 102 ug/mL — ABNORMAL HIGH (ref 50.0–100.0)

## 2015-12-26 LAB — AMMONIA: AMMONIA: 137 umol/L — AB (ref 9–35)

## 2015-12-27 LAB — CLOZAPINE (CLOZARIL)
CLOZAPINE LVL: 403 ng/mL (ref 350–650)
NorClozapine: 209 ng/mL
Total(Cloz+Norcloz): 612 ng/mL

## 2016-01-16 ENCOUNTER — Other Ambulatory Visit: Payer: Self-pay | Admitting: Psychiatry

## 2016-01-17 ENCOUNTER — Telehealth: Payer: Self-pay

## 2016-01-17 NOTE — Telephone Encounter (Signed)
Contacted this patient to encouraged her to f/u with her psychiatrist (Dr. Orson Slick) in regards to the medication that was given to her while in the hospital. She then started asking questions about her hot flashes and I informed her that would require an office visit. She stated that she has an appt tomorrow.

## 2016-01-18 ENCOUNTER — Encounter: Payer: Self-pay | Admitting: Family Medicine

## 2016-01-18 ENCOUNTER — Ambulatory Visit (INDEPENDENT_AMBULATORY_CARE_PROVIDER_SITE_OTHER): Payer: Managed Care, Other (non HMO) | Admitting: Family Medicine

## 2016-01-18 VITALS — BP 104/62 | HR 120 | Temp 98.2°F | Resp 20 | Ht 66.0 in | Wt 131.1 lb

## 2016-01-18 DIAGNOSIS — R634 Abnormal weight loss: Secondary | ICD-10-CM | POA: Diagnosis not present

## 2016-01-18 DIAGNOSIS — N951 Menopausal and female climacteric states: Secondary | ICD-10-CM | POA: Diagnosis not present

## 2016-01-18 DIAGNOSIS — R000 Tachycardia, unspecified: Secondary | ICD-10-CM

## 2016-01-18 DIAGNOSIS — K5900 Constipation, unspecified: Secondary | ICD-10-CM | POA: Diagnosis not present

## 2016-01-18 DIAGNOSIS — F250 Schizoaffective disorder, bipolar type: Secondary | ICD-10-CM | POA: Diagnosis not present

## 2016-01-18 DIAGNOSIS — K5909 Other constipation: Secondary | ICD-10-CM

## 2016-01-18 DIAGNOSIS — R232 Flushing: Secondary | ICD-10-CM

## 2016-01-18 NOTE — Progress Notes (Signed)
Name: Carla Thompson   MRN: [id number]    DOB: 1961/02/22   Date:01/18/2016       Progress Note  Subjective  Chief Complaint  Chief Complaint  Patient presents with  . Weight Loss    Onset-January 2017, since patient was admitted, patient husband states she can eat large meals and 10 minutes later still be hungry, but is still losing weight-lose 4 pounds since last visit. Patient stays hungry, and would like to find out what medication is increasing her appetite   . Hot Flashes    Patient states she has had them for 10 years and symptoms are unchanged, wakes up sweaty   . Constipation    Had questions about starting Linzess, is worried about diarrhea.    HPI  Weight loss: she has lost 8 lbs since she was discharged from psychiatric unit about one month ago. Only change is that Depakote was discontinued about one week ago. She has been eating at least 3 meals daily.  She had a normal TSH while at [facility], CBC showed mild anemia. She has tachycardia - during hospitalization she was started on Metoprolol and per note likely secondary to Clozaril.   Hot Flashes: she uses a heavy comforter set at night, wakes up during the night with hot flashes.   Chronic constipation: she never started Linzess, still taking Colace and Miralax but not working well for her, still has straining , and still not having a bowel movement daily.   Schizophrenia: still confused and insecure, states she is not eating right. Not able to plan meals, still overwhelmed making her grocery list. . Seeing psychiatrist . No longer has hallucinations.   Patient Active Problem List   Diagnosis Date Noted  . RLS (restless legs syndrome) 12/21/2015  . IBS (irritable bowel syndrome) 12/21/2015  . Other long term (current) drug therapy 12/08/2015  . Chronic constipation 12/08/2015  . Insomnia, persistent 12/08/2015  . Dermatitis, eczematoid 12/08/2015  . Gravida 2 para 2 12/08/2015  . Blood glucose elevated 12/08/2015  .  Hypertriglyceridemia 12/08/2015  . Anemia, iron deficiency 12/08/2015  . Metallic taste 40/98/1191  . 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 Schizoaffective disorder, bipolar type ([place]) 12/08/2015    Past Surgical History  Procedure Laterality Date  . Tubal ligation  12/02/1994  . Anus surgery  2000    [place]    Family History  Problem Relation Age of Onset  . Mental illness Sister     Schizophrenia and bipolar  . Cancer Maternal Grandmother     Colon  . Mental illness Paternal Grandmother     Schizophrenia    Social History   Social History  . Marital Status: Married    Spouse Name: N/A  . Number of Children: N/A  . Years of Education: N/A   Occupational History  . Not on file.   Social History Main Topics  . Smoking status: Never Smoker   . Smokeless tobacco: Never Used  . Alcohol Use: No  . Drug Use: No  . Sexual Activity: Not Currently   Other Topics Concern  . Not on file   Social History Narrative     Current outpatient prescriptions:  .  B Complex-C (SUPER B COMPLEX PO), Take 1 tablet by mouth daily., Disp: , Rfl:  .  clonazePAM (KLONOPIN) 0.5 MG tablet, Take 0.5 mg by mouth at bedtime. , Disp: , Rfl:  .  cloZAPine (CLOZARIL) 100 MG tablet, Take 100-300 mg by mouth 2 (two) times daily.  Pt takes one tablet in the morning and three tablets at bedtime., Disp: , Rfl:  .  docusate sodium (STOOL SOFTENER) 100 MG capsule, Take 100 mg by mouth 2 (two) times daily as needed for mild constipation. , Disp: , Rfl:  .  Linaclotide (LINZESS) 145 MCG CAPS capsule, Take 1 capsule (145 mcg total) by mouth daily., Disp: 30 capsule, Rfl: 2 .  Magnesium Gluconate 250 MG TABS, Take 250 mg by mouth daily. , Disp: , Rfl:  .  metoprolol tartrate (LOPRESSOR) 25 MG tablet, Take 0.5 tablets (12.5 mg total) by mouth 2 (two) times daily., Disp: 30 tablet, Rfl: 0 .  ranitidine (ZANTAC) 150 MG tablet, Take 1 tablet (150 mg total) by mouth 2 (two) times daily., Disp: 60 tablet,  Rfl: 3 .  vitamin C (ASCORBIC ACID) 500 MG tablet, Take 500 mg by mouth daily., Disp: , Rfl:  .  vitamin E 1000 UNIT capsule, Take 1,000 Units by mouth daily. , Disp: , Rfl:  .  Melatonin 5 MG TABS, Take 5 mg by mouth at bedtime as needed (for sleep). Reported on 01/18/2016, Disp: , Rfl:   Allergies  Allergen Reactions  . Escitalopram Other (See Comments)    Reaction:  Unknown   . Levsin [Hyoscyamine Sulfate] Other (See Comments)    Reaction:  Unknown      ROS  Constitutional: Negative for fever , positive for  weight change.  Respiratory: Negative for cough and shortness of breath.   Cardiovascular: Negative for chest pain or palpitations.  Gastrointestinal: Negative for abdominal pain, no bowel changes - still constipated.  Musculoskeletal: Negative for gait problem or joint swelling.  Skin: Negative for rash.  Neurological: Negative for dizziness or headache.  No other specific complaints in a complete review of systems (except as listed in HPI above).  Objective  Filed Vitals:   01/18/16 1337 01/18/16 1347  BP: 104/62   Pulse: 128 120  Temp: 98.2 F (36.8 C)   TempSrc: Oral   Resp: 20   Height: 5' 6"  (1.676 m)   Weight: 131 lb 1.6 oz (59.467 kg)   SpO2: 97%     Body mass index is 21.17 kg/(m^2).  Physical Exam  Constitutional: Patient appears well-developed and well-nourished.  No distress.  HEENT: head atraumatic, normocephalic, pupils equal and reactive to light, neck supple, throat within normal limits Cardiovascular: Normal rate, regular rhythm and normal heart sounds.  No murmur heard. No BLE edema. Pulmonary/Chest: Effort normal and breath sounds normal. No respiratory distress. Abdominal: Soft.  There is no tenderness. Psychiatric: Patient has a flat affect, moving hands. Good eye contact. Asking husband to verify what she is taking  Recent Results (from the past 2160 hour(s))  CBC with Differential/Platelet     Status: None   Collection Time: 11/01/15   1:05 PM  Result Value Ref Range   WBC 6.7 3.6 - 11.0 K/uL   RBC 4.21 3.80 - 5.20 MIL/uL   Hemoglobin 12.5 12.0 - 16.0 g/dL   HCT 38.2 35.0 - 47.0 %   MCV 90.7 80.0 - 100.0 fL   MCH 29.6 26.0 - 34.0 pg   MCHC 32.6 32.0 - 36.0 g/dL   RDW 12.5 11.5 - 14.5 %   Platelets 240 150 - 440 K/uL   Neutrophils Relative % 65 %   Neutro Abs 4.4 1.4 - 6.5 K/uL   Lymphocytes Relative 25 %   Lymphs Abs 1.7 1.0 - 3.6 K/uL   Monocytes Relative 7 %  Monocytes Absolute 0.5 0.2 - 0.9 K/uL   Eosinophils Relative 2 %   Eosinophils Absolute 0.2 0 - 0.7 K/uL   Basophils Relative 1 %   Basophils Absolute 0.0 0 - 0.1 K/uL  CBC with Differential/Platelet     Status: Abnormal   Collection Time: 11/27/15 12:03 PM  Result Value Ref Range   WBC 5.8 3.6 - 11.0 K/uL   RBC 4.02 3.80 - 5.20 MIL/uL   Hemoglobin 11.9 (L) 12.0 - 16.0 g/dL   HCT 36.5 35.0 - 47.0 %   MCV 90.8 80.0 - 100.0 fL   MCH 29.8 26.0 - 34.0 pg   MCHC 32.8 32.0 - 36.0 g/dL   RDW 12.9 11.5 - 14.5 %   Platelets 234 150 - 440 K/uL   Neutrophils Relative % 67 %   Neutro Abs 3.9 1.4 - 6.5 K/uL   Lymphocytes Relative 25 %   Lymphs Abs 1.5 1.0 - 3.6 K/uL   Monocytes Relative 5 %   Monocytes Absolute 0.3 0.2 - 0.9 K/uL   Eosinophils Relative 2 %   Eosinophils Absolute 0.1 0 - 0.7 K/uL   Basophils Relative 1 %   Basophils Absolute 0.0 0 - 0.1 K/uL  POC Hemoccult Bld/Stl (1-Cd Office Dx)     Status: Normal   Collection Time: 12/08/15  1:18 PM  Result Value Ref Range   Card #1 Date negative    Fecal Occult Blood, POC  Negative  Comprehensive metabolic panel     Status: Abnormal   Collection Time: 12/09/15  1:41 PM  Result Value Ref Range   Sodium 142 135 - 145 mmol/L   Potassium 4.6 3.5 - 5.1 mmol/L   Chloride 109 101 - 111 mmol/L   CO2 24 22 - 32 mmol/L   Glucose, Bld 132 (H) 65 - 99 mg/dL   BUN 10 6 - 20 mg/dL   Creatinine, Ser 0.89 0.44 - 1.00 mg/dL   Calcium 9.8 8.9 - 10.3 mg/dL   Total Protein 8.0 6.5 - 8.1 g/dL   Albumin 4.7 3.5  - 5.0 g/dL   AST 15 15 - 41 U/L   ALT 17 14 - 54 U/L   Alkaline Phosphatase 81 38 - 126 U/L   Total Bilirubin 0.6 0.3 - 1.2 mg/dL   GFR calc non Af Amer >60 >60 mL/min   GFR calc Af Amer >60 >60 mL/min    Comment: (NOTE) The eGFR has been calculated using the CKD EPI equation. This calculation has not been validated in all clinical situations. eGFR's persistently <60 mL/min signify possible Chronic Kidney Disease.    Anion gap 9 5 - 15  Ethanol (ETOH)     Status: None   Collection Time: 12/09/15  1:41 PM  Result Value Ref Range   Alcohol, Ethyl (B) <5 <5 mg/dL    Comment:        LOWEST DETECTABLE LIMIT FOR SERUM ALCOHOL IS 5 mg/dL FOR MEDICAL PURPOSES ONLY   Salicylate level     Status: None   Collection Time: 12/09/15  1:41 PM  Result Value Ref Range   Salicylate Lvl <8.1 2.8 - 30.0 mg/dL  Acetaminophen level     Status: Abnormal   Collection Time: 12/09/15  1:41 PM  Result Value Ref Range   Acetaminophen (Tylenol), Serum <10 (L) 10 - 30 ug/mL    Comment:        THERAPEUTIC CONCENTRATIONS VARY SIGNIFICANTLY. A RANGE OF 10-30 ug/mL MAY BE AN EFFECTIVE CONCENTRATION FOR MANY PATIENTS.  HOWEVER, SOME ARE BEST TREATED AT CONCENTRATIONS OUTSIDE THIS RANGE. ACETAMINOPHEN CONCENTRATIONS >150 ug/mL AT 4 HOURS AFTER INGESTION AND >50 ug/mL AT 12 HOURS AFTER INGESTION ARE OFTEN ASSOCIATED WITH TOXIC REACTIONS.   CBC     Status: None   Collection Time: 12/09/15  1:41 PM  Result Value Ref Range   WBC 10.1 3.6 - 11.0 K/uL   RBC 4.61 3.80 - 5.20 MIL/uL   Hemoglobin 13.8 12.0 - 16.0 g/dL   HCT 41.9 35.0 - 47.0 %   MCV 91.0 80.0 - 100.0 fL   MCH 30.0 26.0 - 34.0 pg   MCHC 33.0 32.0 - 36.0 g/dL   RDW 13.2 11.5 - 14.5 %   Platelets 339 150 - 440 K/uL  Differential     Status: Abnormal   Collection Time: 12/09/15  1:41 PM  Result Value Ref Range   Neutrophils Relative % 79 %   Neutro Abs 7.9 (H) 1.4 - 6.5 K/uL   Lymphocytes Relative 14 %   Lymphs Abs 1.4 1.0 - 3.6 K/uL    Monocytes Relative 7 %   Monocytes Absolute 0.7 0.2 - 0.9 K/uL   Eosinophils Relative 1 %   Eosinophils Absolute 0.0 0 - 0.7 K/uL   Basophils Relative 1 %   Basophils Absolute 0.1 0 - 0.1 K/uL  TSH     Status: None   Collection Time: 12/10/15  2:28 PM  Result Value Ref Range   TSH 1.831 0.350 - 4.500 uIU/mL  Hemoglobin A1c     Status: None   Collection Time: 12/10/15  2:28 PM  Result Value Ref Range   Hgb A1c MFr Bld 5.2 4.0 - 6.0 %  CK isoenzymes (brain, muscle injury)     Status: Abnormal   Collection Time: 12/10/15  2:28 PM  Result Value Ref Range   CK-MM 100 97 - 100 %   CK-MB 0 0 - 3 %   CK-BB 0 0 %    Comment: (NOTE) Performed At: [facility] [place], [place] [id number] Lindon Romp MD [id number]    Creatine Kinase-Total 230 (H) 24 - 173 U/L   Macro Type 1 0 Not Observed %   Macro Type 2 0 Not Observed %  Lipid panel     Status: None   Collection Time: 12/11/15 10:14 AM  Result Value Ref Range   Cholesterol 182 0 - 200 mg/dL   Triglycerides 80 <150 mg/dL   HDL 80 >40 mg/dL   Total CHOL/HDL Ratio 2.3 RATIO   VLDL 16 0 - 40 mg/dL   LDL Cholesterol 86 0 - 99 mg/dL    Comment:        Total Cholesterol/HDL:CHD Risk Coronary Heart Disease Risk Table                     Men   Women  1/2 Average Risk   3.4   3.3  Average Risk       5.0   4.4  2 X Average Risk   9.6   7.1  3 X Average Risk  23.4   11.0        Use the calculated Patient Ratio above and the CHD Risk Table to determine the patient's CHD Risk.        ATP III CLASSIFICATION (LDL):  <100     mg/dL   Optimal  100-129  mg/dL   Near or Above  Optimal  130-159  mg/dL   Borderline  160-189  mg/dL   High  >190     mg/dL   Very High   Hemoglobin A1c     Status: None   Collection Time: 12/12/15  6:37 AM  Result Value Ref Range   Hgb A1c MFr Bld 5.0 4.0 - 6.0 %  Lipid panel, fasting     Status: None   Collection Time: 12/12/15  6:37 AM  Result Value Ref  Range   Cholesterol 172 0 - 200 mg/dL   Triglycerides 74 <150 mg/dL   HDL 73 >40 mg/dL   Total CHOL/HDL Ratio 2.4 RATIO   VLDL 15 0 - 40 mg/dL   LDL Cholesterol 84 0 - 99 mg/dL    Comment:        Total Cholesterol/HDL:CHD Risk Coronary Heart Disease Risk Table                     Men   Women  1/2 Average Risk   3.4   3.3  Average Risk       5.0   4.4  2 X Average Risk   9.6   7.1  3 X Average Risk  23.4   11.0        Use the calculated Patient Ratio above and the CHD Risk Table to determine the patient's CHD Risk.        ATP III CLASSIFICATION (LDL):  <100     mg/dL   Optimal  100-129  mg/dL   Near or Above                    Optimal  130-159  mg/dL   Borderline  160-189  mg/dL   High  >190     mg/dL   Very High   TSH     Status: None   Collection Time: 12/12/15  6:37 AM  Result Value Ref Range   TSH 2.228 0.350 - 4.500 uIU/mL  Glucose, capillary     Status: None   Collection Time: 12/13/15 12:37 AM  Result Value Ref Range   Glucose-Capillary 97 65 - 99 mg/dL  Urinalysis complete, with microscopic ([facility] only)     Status: Abnormal   Collection Time: 12/15/15  3:45 AM  Result Value Ref Range   Color, Urine YELLOW (A) YELLOW   APPearance CLOUDY (A) CLEAR   Glucose, UA NEGATIVE NEGATIVE mg/dL   Bilirubin Urine NEGATIVE NEGATIVE   Ketones, ur NEGATIVE NEGATIVE mg/dL   Specific Gravity, Urine 1.009 1.005 - 1.030   Hgb urine dipstick 3+ (A) NEGATIVE   pH 6.0 5.0 - 8.0   Protein, ur NEGATIVE NEGATIVE mg/dL   Nitrite POSITIVE (A) NEGATIVE   Leukocytes, UA 1+ (A) NEGATIVE   RBC / HPF 6-30 0 - 5 RBC/hpf   WBC, UA 6-30 0 - 5 WBC/hpf   Bacteria, UA FEW (A) NONE SEEN   Squamous Epithelial / LPF 0-5 (A) NONE SEEN  Valproic acid level     Status: None   Collection Time: 12/16/15  6:59 AM  Result Value Ref Range   Valproic Acid Lvl 89 50.0 - 100.0 ug/mL  CBC with Differential/Platelet     Status: None   Collection Time: 12/16/15  6:59 AM  Result Value Ref Range   WBC 6.6  3.6 - 11.0 K/uL   RBC 4.25 3.80 - 5.20 MIL/uL   Hemoglobin 12.7 12.0 - 16.0 g/dL   HCT 38.5 35.0 -  47.0 %   MCV 90.6 80.0 - 100.0 fL   MCH 29.9 26.0 - 34.0 pg   MCHC 33.0 32.0 - 36.0 g/dL   RDW 13.0 11.5 - 14.5 %   Platelets 265 150 - 440 K/uL   Neutrophils Relative % 64 %   Neutro Abs 4.2 1.4 - 6.5 K/uL   Lymphocytes Relative 20 %   Lymphs Abs 1.3 1.0 - 3.6 K/uL   Monocytes Relative 8 %   Monocytes Absolute 0.5 0.2 - 0.9 K/uL   Eosinophils Relative 6 %   Eosinophils Absolute 0.4 0 - 0.7 K/uL   Basophils Relative 2 %   Basophils Absolute 0.1 0 - 0.1 K/uL  Clozapine (clozaril)     Status: Abnormal   Collection Time: 12/19/15  3:45 PM  Result Value Ref Range   Clozapine Lvl 301 (L) 350 - 650 ng/mL    Comment:               **Please note reference interval change**   NorClozapine 158 Not Estab. ng/mL   Total(Cloz+Norcloz) 459 ng/mL    Comment: (NOTE) Patients dosed with 400 mg clozapine daily for 4 weeks were most likely to exhibit a therapeutic effect when the sum of clozapine and norclozapine concentrations were at least 450 ng/mL. Vira Agar, et al. Rexford Maus Consensus Guidelines for Therapeutic Drug Monitoring in Psychiatry: Update 2011, Pharmacopsychiatry Sep 2011; 44(6):195-235.                                Detection Limit = 20 Performed At: [facility] [place], [place] [id number] Lindon Romp MD [id number]   Ammonia     Status: Abnormal   Collection Time: 12/26/15 11:15 AM  Result Value Ref Range   Ammonia 137 (H) 9 - 35 umol/L  CBC with Differential/Platelet     Status: Abnormal   Collection Time: 12/26/15 11:15 AM  Result Value Ref Range   WBC 8.4 3.6 - 11.0 K/uL   RBC 3.90 3.80 - 5.20 MIL/uL   Hemoglobin 11.8 (L) 12.0 - 16.0 g/dL   HCT 35.1 35.0 - 47.0 %   MCV 90.1 80.0 - 100.0 fL   MCH 30.4 26.0 - 34.0 pg   MCHC 33.7 32.0 - 36.0 g/dL   RDW 13.0 11.5 - 14.5 %   Platelets 184 150 - 440 K/uL   Neutrophils  Relative % 80 %   Neutro Abs 6.8 (H) 1.4 - 6.5 K/uL   Lymphocytes Relative 12 %   Lymphs Abs 1.0 1.0 - 3.6 K/uL   Monocytes Relative 7 %   Monocytes Absolute 0.6 0.2 - 0.9 K/uL   Eosinophils Relative 1 %   Eosinophils Absolute 0.0 0 - 0.7 K/uL   Basophils Relative 0 %   Basophils Absolute 0.0 0 - 0.1 K/uL  Clozapine (clozaril)     Status: None   Collection Time: 12/26/15 11:15 AM  Result Value Ref Range   Clozapine Lvl 403 350 - 650 ng/mL    Comment:               **Please note reference interval change**   NorClozapine 209 Not Estab. ng/mL   Total(Cloz+Norcloz) 612 ng/mL    Comment: (NOTE) Patients dosed with 400 mg clozapine daily for 4 weeks were most likely to exhibit a therapeutic effect when the sum of clozapine and norclozapine concentrations were at least 450 ng/mL. Hiemke  Francene Castle, et al. Rexford Maus Consensus Guidelines for Therapeutic Drug Monitoring in Psychiatry: Update 2011, Pharmacopsychiatry Sep 2011; 44(6):195-235.                                Detection Limit = 20 Performed At: [facility] [place], [place] [id number] Lindon Romp MD [id number]   Valproic acid level     Status: Abnormal   Collection Time: 12/26/15 11:15 AM  Result Value Ref Range   Valproic Acid Lvl 102 (H) 50.0 - 100.0 ug/mL     PHQ2/9: Depression screen [facility] 2/9 12/21/2015 12/08/2015  Decreased Interest 1 0  Down, Depressed, Hopeless 3 0  PHQ - 2 Score 4 0  Altered sleeping 3 -  Tired, decreased energy 3 -  Change in appetite 2 -  Feeling bad or failure about yourself  1 -  Trouble concentrating 3 -  Moving slowly or fidgety/restless 2 -  Suicidal thoughts 0 -  PHQ-9 Score 18 -  Difficult doing work/chores Very difficult -     Fall Risk: Fall Risk  12/21/2015 12/08/2015  Falls in the past year? No No    Assessment & Plan  1. Chronic constipation  Discussed importance of taking Linzess  2. Schizoaffective disorder, bipolar type  ([place])  Continue follow up with Dr. Lynder [facility]  3. Tachycardia  Check TSH and CBC  4. Hot flashes  Discussed going to bed with light clothing, use a fan at night. Avoid new medications at this time  5. Weight loss  - CBC with Differential/Platelet - Comprehensive metabolic panel - TSH - Vitamin B12 - VITAMIN D 25 Hydroxy (Vit-D Deficiency, Fractures)

## 2016-01-19 ENCOUNTER — Other Ambulatory Visit: Payer: Self-pay | Admitting: Family Medicine

## 2016-01-19 ENCOUNTER — Telehealth: Payer: Self-pay

## 2016-01-19 LAB — CBC WITH DIFFERENTIAL/PLATELET
Basophils Absolute: 0.1 10*3/uL (ref 0.0–0.2)
Basos: 0 %
EOS (ABSOLUTE): 0.1 10*3/uL (ref 0.0–0.4)
Eos: 1 %
Hematocrit: 41.1 % (ref 34.0–46.6)
Hemoglobin: 13.3 g/dL (ref 11.1–15.9)
Immature Grans (Abs): 0 10*3/uL (ref 0.0–0.1)
Immature Granulocytes: 0 %
Lymphocytes Absolute: 2.1 10*3/uL (ref 0.7–3.1)
Lymphs: 17 %
MCH: 30.4 pg (ref 26.6–33.0)
MCHC: 32.4 g/dL (ref 31.5–35.7)
MCV: 94 fL (ref 79–97)
Monocytes Absolute: 1 10*3/uL — ABNORMAL HIGH (ref 0.1–0.9)
Monocytes: 8 %
Neutrophils Absolute: 9.3 10*3/uL — ABNORMAL HIGH (ref 1.4–7.0)
Neutrophils: 74 %
Platelets: 328 10*3/uL (ref 150–379)
RBC: 4.37 x10E6/uL (ref 3.77–5.28)
RDW: 13.5 % (ref 12.3–15.4)
WBC: 12.5 10*3/uL — ABNORMAL HIGH (ref 3.4–10.8)

## 2016-01-19 LAB — COMPREHENSIVE METABOLIC PANEL WITH GFR
ALT: 14 [IU]/L (ref 0–32)
AST: 15 [IU]/L (ref 0–40)
Albumin/Globulin Ratio: 2 (ref 1.1–2.5)
Albumin: 4.4 g/dL (ref 3.5–5.5)
Alkaline Phosphatase: 79 [IU]/L (ref 39–117)
BUN/Creatinine Ratio: 22 (ref 9–23)
BUN: 17 mg/dL (ref 6–24)
Bilirubin Total: <0.2 mg/dL (ref 0.0–1.2)
CO2: 21 mmol/L (ref 18–29)
Calcium: 9.3 mg/dL (ref 8.7–10.2)
Chloride: 98 mmol/L (ref 96–106)
Creatinine, Ser: 0.79 mg/dL (ref 0.57–1.00)
GFR calc Af Amer: 99 mL/min/{1.73_m2}
GFR calc non Af Amer: 86 mL/min/{1.73_m2}
Globulin, Total: 2.2 g/dL (ref 1.5–4.5)
Glucose: 78 mg/dL (ref 65–99)
Potassium: 4.3 mmol/L (ref 3.5–5.2)
Sodium: 140 mmol/L (ref 134–144)
Total Protein: 6.6 g/dL (ref 6.0–8.5)

## 2016-01-19 LAB — VITAMIN B12: Vitamin B-12: 1069 pg/mL — ABNORMAL HIGH (ref 211–946)

## 2016-01-19 LAB — VITAMIN D 25 HYDROXY (VIT D DEFICIENCY, FRACTURES): Vit D, 25-Hydroxy: 13.3 ng/mL — ABNORMAL LOW (ref 30.0–100.0)

## 2016-01-19 LAB — TSH: TSH: 2.29 u[IU]/mL (ref 0.450–4.500)

## 2016-01-19 MED ORDER — VITAMIN D (ERGOCALCIFEROL) 1.25 MG (50000 UNIT) PO CAPS: 50000.0000 [IU] | ORAL_CAPSULE | ORAL | Status: DC

## 2016-01-19 NOTE — Telephone Encounter (Signed)
Patient called wanting to know her lab results and if she should take a stool softener with the Linzess. Patient was informed to only take the [place] and that as soon as Dr. Ancil Boozer has reviewed her blood work, someone will give her a call.

## 2016-01-19 NOTE — Telephone Encounter (Signed)
rx for metoprolol 25mg , #60, take 0.5mg  by mouth 2 times daily, 2 refills, was submitted to this patient's preferred pharmacy via the prescriber's voicemail.

## 2016-01-23 ENCOUNTER — Ambulatory Visit (INDEPENDENT_AMBULATORY_CARE_PROVIDER_SITE_OTHER): Payer: Managed Care, Other (non HMO) | Admitting: Family Medicine

## 2016-01-23 ENCOUNTER — Encounter: Payer: Self-pay | Admitting: Family Medicine

## 2016-01-23 VITALS — BP 112/70 | HR 100 | Temp 97.8°F | Resp 16 | Wt 133.5 lb

## 2016-01-23 DIAGNOSIS — D72829 Elevated white blood cell count, unspecified: Secondary | ICD-10-CM | POA: Diagnosis not present

## 2016-01-23 DIAGNOSIS — R151 Fecal smearing: Secondary | ICD-10-CM

## 2016-01-23 NOTE — Progress Notes (Signed)
Name: Carla Thompson   MRN: [id number]    DOB: 05/10/1961   Date:01/23/2016       Progress Note  Subjective  Chief Complaint  Chief Complaint  Patient presents with  . Abnormal Lab  . Labs Only    HPI  Leukocytosis: she states she had mild nausea today, she has some abdominal pain before she has a bowel movement. Taking Linzess for constipation and states she has pasty stools and soiling ( since episiotomy with both her children ). She has been more obsessed about it over the past month. Discussed referral to GI, but she wants to hold off - seen by GI in the past and had a colonoscopy . She had an URI but is doing well now.    Patient Active Problem List   Diagnosis Date Noted  . RLS (restless legs syndrome) 12/21/2015  . IBS (irritable bowel syndrome) 12/21/2015  . Other long term (current) drug therapy 12/08/2015  . Chronic constipation 12/08/2015  . Insomnia, persistent 12/08/2015  . Dermatitis, eczematoid 12/08/2015  . Gravida 2 para 2 12/08/2015  . Blood glucose elevated 12/08/2015  . Hypertriglyceridemia 12/08/2015  . Anemia, iron deficiency 12/08/2015  . Metallic taste 87/68/1157  . 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 Schizoaffective disorder, bipolar type ([place]) 12/08/2015    Past Surgical History  Procedure Laterality Date  . Tubal ligation  12/02/1994  . Anus surgery  2000    [place]    Family History  Problem Relation Age of Onset  . Mental illness Sister     Schizophrenia and bipolar  . Cancer Maternal Grandmother     Colon  . Mental illness Paternal Grandmother     Schizophrenia    Social History   Social History  . Marital Status: Married    Spouse Name: N/A  . Number of Children: N/A  . Years of Education: N/A   Occupational History  . Not on file.   Social History Main Topics  . Smoking status: Never Smoker   . Smokeless tobacco: Never Used  . Alcohol Use: No  . Drug Use: No  . Sexual Activity: Not Currently   Other Topics  Concern  . Not on file   Social History Narrative     Current outpatient prescriptions:  .  B Complex-C (SUPER B COMPLEX PO), Take 1 tablet by mouth daily., Disp: , Rfl:  .  clonazePAM (KLONOPIN) 0.5 MG tablet, Take 0.5 mg by mouth at bedtime. , Disp: , Rfl:  .  cloZAPine (CLOZARIL) 100 MG tablet, Take 100-300 mg by mouth 2 (two) times daily. Pt takes one tablet in the morning and three tablets at bedtime., Disp: , Rfl:  .  docusate sodium (STOOL SOFTENER) 100 MG capsule, Take 100 mg by mouth 2 (two) times daily as needed for mild constipation. , Disp: , Rfl:  .  Linaclotide (LINZESS) 145 MCG CAPS capsule, Take 1 capsule (145 mcg total) by mouth daily., Disp: 30 capsule, Rfl: 2 .  Magnesium Gluconate 250 MG TABS, Take 250 mg by mouth daily. , Disp: , Rfl:  .  Melatonin 5 MG TABS, Take 5 mg by mouth at bedtime as needed (for sleep). Reported on 01/18/2016, Disp: , Rfl:  .  metoprolol tartrate (LOPRESSOR) 25 MG tablet, Take 0.5 tablets (12.5 mg total) by mouth 2 (two) times daily., Disp: 30 tablet, Rfl: 0 .  ranitidine (ZANTAC) 150 MG tablet, Take 1 tablet (150 mg total) by mouth 2 (two) times daily., Disp: 60 tablet,  Rfl: 3 .  vitamin C (ASCORBIC ACID) 500 MG tablet, Take 500 mg by mouth daily., Disp: , Rfl:  .  Vitamin D, Ergocalciferol, (DRISDOL) 50000 units CAPS capsule, Take 1 capsule (50,000 Units total) by mouth every 7 (seven) days., Disp: 12 capsule, Rfl: 0 .  vitamin E 1000 UNIT capsule, Take 1,000 Units by mouth daily. , Disp: , Rfl:   Allergies  Allergen Reactions  . Escitalopram Other (See Comments)    Reaction:  Unknown   . Levsin [Hyoscyamine Sulfate] Other (See Comments)    Reaction:  Unknown      ROS  Constitutional: Negative for fever, mild  weight change - gained a couple of pounds  Respiratory: Negative for cough and shortness of breath.   Cardiovascular: Negative for chest pain or palpitations.  Gastrointestinal: Negative for abdominal pain, no bowel changes.   Musculoskeletal: Negative for gait problem or joint swelling.  Skin: Negative for rash.  Neurological: Negative for dizziness or headache.  No other specific complaints in a complete review of systems (except as listed in HPI above).  Objective  Filed Vitals:   01/23/16 1145 01/23/16 1210  BP: 112/70   Pulse: 107 100  Temp: 97.8 F (36.6 C)   TempSrc: Oral   Resp: 16   Weight: 133 lb 8 oz (60.555 kg)   SpO2: 96%     Body mass index is 21.56 kg/(m^2).  Physical Exam  Constitutional: Patient appears well-developed and well-nourished.  No distress.  HEENT: head atraumatic, normocephalic, pupils equal and reactive to light, neck supple, throat within normal limits Cardiovascular: Normal rate, regular rhythm and normal heart sounds.  No murmur heard. No BLE edema. Pulmonary/Chest: Effort normal and breath sounds normal. No respiratory distress. Abdominal: Soft.  Mild supra pubic pain, negative CVA tenderness Psychiatric: Rumination, very worried about her bowel incontinence, a problem she had for the past 24 years  Recent Results (from the past 2160 hour(s))  CBC with Differential/Platelet     Status: None   Collection Time: 11/01/15  1:05 PM  Result Value Ref Range   WBC 6.7 3.6 - 11.0 K/uL   RBC 4.21 3.80 - 5.20 MIL/uL   Hemoglobin 12.5 12.0 - 16.0 g/dL   HCT 38.2 35.0 - 47.0 %   MCV 90.7 80.0 - 100.0 fL   MCH 29.6 26.0 - 34.0 pg   MCHC 32.6 32.0 - 36.0 g/dL   RDW 12.5 11.5 - 14.5 %   Platelets 240 150 - 440 K/uL   Neutrophils Relative % 65 %   Neutro Abs 4.4 1.4 - 6.5 K/uL   Lymphocytes Relative 25 %   Lymphs Abs 1.7 1.0 - 3.6 K/uL   Monocytes Relative 7 %   Monocytes Absolute 0.5 0.2 - 0.9 K/uL   Eosinophils Relative 2 %   Eosinophils Absolute 0.2 0 - 0.7 K/uL   Basophils Relative 1 %   Basophils Absolute 0.0 0 - 0.1 K/uL  CBC with Differential/Platelet     Status: Abnormal   Collection Time: 11/27/15 12:03 PM  Result Value Ref Range   WBC 5.8 3.6 - 11.0 K/uL    RBC 4.02 3.80 - 5.20 MIL/uL   Hemoglobin 11.9 (L) 12.0 - 16.0 g/dL   HCT 36.5 35.0 - 47.0 %   MCV 90.8 80.0 - 100.0 fL   MCH 29.8 26.0 - 34.0 pg   MCHC 32.8 32.0 - 36.0 g/dL   RDW 12.9 11.5 - 14.5 %   Platelets 234 150 - 440 K/uL  Neutrophils Relative % 67 %   Neutro Abs 3.9 1.4 - 6.5 K/uL   Lymphocytes Relative 25 %   Lymphs Abs 1.5 1.0 - 3.6 K/uL   Monocytes Relative 5 %   Monocytes Absolute 0.3 0.2 - 0.9 K/uL   Eosinophils Relative 2 %   Eosinophils Absolute 0.1 0 - 0.7 K/uL   Basophils Relative 1 %   Basophils Absolute 0.0 0 - 0.1 K/uL  POC Hemoccult Bld/Stl (1-Cd Office Dx)     Status: Normal   Collection Time: 12/08/15  1:18 PM  Result Value Ref Range   Card #1 Date negative    Fecal Occult Blood, POC  Negative  Comprehensive metabolic panel     Status: Abnormal   Collection Time: 12/09/15  1:41 PM  Result Value Ref Range   Sodium 142 135 - 145 mmol/L   Potassium 4.6 3.5 - 5.1 mmol/L   Chloride 109 101 - 111 mmol/L   CO2 24 22 - 32 mmol/L   Glucose, Bld 132 (H) 65 - 99 mg/dL   BUN 10 6 - 20 mg/dL   Creatinine, Ser 0.89 0.44 - 1.00 mg/dL   Calcium 9.8 8.9 - 10.3 mg/dL   Total Protein 8.0 6.5 - 8.1 g/dL   Albumin 4.7 3.5 - 5.0 g/dL   AST 15 15 - 41 U/L   ALT 17 14 - 54 U/L   Alkaline Phosphatase 81 38 - 126 U/L   Total Bilirubin 0.6 0.3 - 1.2 mg/dL   GFR calc non Af Amer >60 >60 mL/min   GFR calc Af Amer >60 >60 mL/min    Comment: (NOTE) The eGFR has been calculated using the CKD EPI equation. This calculation has not been validated in all clinical situations. eGFR's persistently <60 mL/min signify possible Chronic Kidney Disease.    Anion gap 9 5 - 15  Ethanol (ETOH)     Status: None   Collection Time: 12/09/15  1:41 PM  Result Value Ref Range   Alcohol, Ethyl (B) <5 <5 mg/dL    Comment:        LOWEST DETECTABLE LIMIT FOR SERUM ALCOHOL IS 5 mg/dL FOR MEDICAL PURPOSES ONLY   Salicylate level     Status: None   Collection Time: 12/09/15  1:41 PM   Result Value Ref Range   Salicylate Lvl <8.4 2.8 - 30.0 mg/dL  Acetaminophen level     Status: Abnormal   Collection Time: 12/09/15  1:41 PM  Result Value Ref Range   Acetaminophen (Tylenol), Serum <10 (L) 10 - 30 ug/mL    Comment:        THERAPEUTIC CONCENTRATIONS VARY SIGNIFICANTLY. A RANGE OF 10-30 ug/mL MAY BE AN EFFECTIVE CONCENTRATION FOR MANY PATIENTS. HOWEVER, SOME ARE BEST TREATED AT CONCENTRATIONS OUTSIDE THIS RANGE. ACETAMINOPHEN CONCENTRATIONS >150 ug/mL AT 4 HOURS AFTER INGESTION AND >50 ug/mL AT 12 HOURS AFTER INGESTION ARE OFTEN ASSOCIATED WITH TOXIC REACTIONS.   CBC     Status: None   Collection Time: 12/09/15  1:41 PM  Result Value Ref Range   WBC 10.1 3.6 - 11.0 K/uL   RBC 4.61 3.80 - 5.20 MIL/uL   Hemoglobin 13.8 12.0 - 16.0 g/dL   HCT 41.9 35.0 - 47.0 %   MCV 91.0 80.0 - 100.0 fL   MCH 30.0 26.0 - 34.0 pg   MCHC 33.0 32.0 - 36.0 g/dL   RDW 13.2 11.5 - 14.5 %   Platelets 339 150 - 440 K/uL  Differential     Status:  Abnormal   Collection Time: 12/09/15  1:41 PM  Result Value Ref Range   Neutrophils Relative % 79 %   Neutro Abs 7.9 (H) 1.4 - 6.5 K/uL   Lymphocytes Relative 14 %   Lymphs Abs 1.4 1.0 - 3.6 K/uL   Monocytes Relative 7 %   Monocytes Absolute 0.7 0.2 - 0.9 K/uL   Eosinophils Relative 1 %   Eosinophils Absolute 0.0 0 - 0.7 K/uL   Basophils Relative 1 %   Basophils Absolute 0.1 0 - 0.1 K/uL  TSH     Status: None   Collection Time: 12/10/15  2:28 PM  Result Value Ref Range   TSH 1.831 0.350 - 4.500 uIU/mL  Hemoglobin A1c     Status: None   Collection Time: 12/10/15  2:28 PM  Result Value Ref Range   Hgb A1c MFr Bld 5.2 4.0 - 6.0 %  CK isoenzymes (brain, muscle injury)     Status: Abnormal   Collection Time: 12/10/15  2:28 PM  Result Value Ref Range   CK-MM 100 97 - 100 %   CK-MB 0 0 - 3 %   CK-BB 0 0 %    Comment: (NOTE) Performed At: [facility] [place], [place] [id number] Lindon Romp MD  [id number]    Creatine Kinase-Total 230 (H) 24 - 173 U/L   Macro Type 1 0 Not Observed %   Macro Type 2 0 Not Observed %  Lipid panel     Status: None   Collection Time: 12/11/15 10:14 AM  Result Value Ref Range   Cholesterol 182 0 - 200 mg/dL   Triglycerides 80 <150 mg/dL   HDL 80 >40 mg/dL   Total CHOL/HDL Ratio 2.3 RATIO   VLDL 16 0 - 40 mg/dL   LDL Cholesterol 86 0 - 99 mg/dL    Comment:        Total Cholesterol/HDL:CHD Risk Coronary Heart Disease Risk Table                     Men   Women  1/2 Average Risk   3.4   3.3  Average Risk       5.0   4.4  2 X Average Risk   9.6   7.1  3 X Average Risk  23.4   11.0        Use the calculated Patient Ratio above and the CHD Risk Table to determine the patient's CHD Risk.        ATP III CLASSIFICATION (LDL):  <100     mg/dL   Optimal  100-129  mg/dL   Near or Above                    Optimal  130-159  mg/dL   Borderline  160-189  mg/dL   High  >190     mg/dL   Very High   Hemoglobin A1c     Status: None   Collection Time: 12/12/15  6:37 AM  Result Value Ref Range   Hgb A1c MFr Bld 5.0 4.0 - 6.0 %  Lipid panel, fasting     Status: None   Collection Time: 12/12/15  6:37 AM  Result Value Ref Range   Cholesterol 172 0 - 200 mg/dL   Triglycerides 74 <150 mg/dL   HDL 73 >40 mg/dL   Total CHOL/HDL Ratio 2.4 RATIO   VLDL 15 0 - 40 mg/dL   LDL Cholesterol 84 0 -  99 mg/dL    Comment:        Total Cholesterol/HDL:CHD Risk Coronary Heart Disease Risk Table                     Men   Women  1/2 Average Risk   3.4   3.3  Average Risk       5.0   4.4  2 X Average Risk   9.6   7.1  3 X Average Risk  23.4   11.0        Use the calculated Patient Ratio above and the CHD Risk Table to determine the patient's CHD Risk.        ATP III CLASSIFICATION (LDL):  <100     mg/dL   Optimal  100-129  mg/dL   Near or Above                    Optimal  130-159  mg/dL   Borderline  160-189  mg/dL   High  >190     mg/dL   Very High    TSH     Status: None   Collection Time: 12/12/15  6:37 AM  Result Value Ref Range   TSH 2.228 0.350 - 4.500 uIU/mL  Glucose, capillary     Status: None   Collection Time: 12/13/15 12:37 AM  Result Value Ref Range   Glucose-Capillary 97 65 - 99 mg/dL  Urinalysis complete, with microscopic ([facility] only)     Status: Abnormal   Collection Time: 12/15/15  3:45 AM  Result Value Ref Range   Color, Urine YELLOW (A) YELLOW   APPearance CLOUDY (A) CLEAR   Glucose, UA NEGATIVE NEGATIVE mg/dL   Bilirubin Urine NEGATIVE NEGATIVE   Ketones, ur NEGATIVE NEGATIVE mg/dL   Specific Gravity, Urine 1.009 1.005 - 1.030   Hgb urine dipstick 3+ (A) NEGATIVE   pH 6.0 5.0 - 8.0   Protein, ur NEGATIVE NEGATIVE mg/dL   Nitrite POSITIVE (A) NEGATIVE   Leukocytes, UA 1+ (A) NEGATIVE   RBC / HPF 6-30 0 - 5 RBC/hpf   WBC, UA 6-30 0 - 5 WBC/hpf   Bacteria, UA FEW (A) NONE SEEN   Squamous Epithelial / LPF 0-5 (A) NONE SEEN  Valproic acid level     Status: None   Collection Time: 12/16/15  6:59 AM  Result Value Ref Range   Valproic Acid Lvl 89 50.0 - 100.0 ug/mL  CBC with Differential/Platelet     Status: None   Collection Time: 12/16/15  6:59 AM  Result Value Ref Range   WBC 6.6 3.6 - 11.0 K/uL   RBC 4.25 3.80 - 5.20 MIL/uL   Hemoglobin 12.7 12.0 - 16.0 g/dL   HCT 38.5 35.0 - 47.0 %   MCV 90.6 80.0 - 100.0 fL   MCH 29.9 26.0 - 34.0 pg   MCHC 33.0 32.0 - 36.0 g/dL   RDW 13.0 11.5 - 14.5 %   Platelets 265 150 - 440 K/uL   Neutrophils Relative % 64 %   Neutro Abs 4.2 1.4 - 6.5 K/uL   Lymphocytes Relative 20 %   Lymphs Abs 1.3 1.0 - 3.6 K/uL   Monocytes Relative 8 %   Monocytes Absolute 0.5 0.2 - 0.9 K/uL   Eosinophils Relative 6 %   Eosinophils Absolute 0.4 0 - 0.7 K/uL   Basophils Relative 2 %   Basophils Absolute 0.1 0 - 0.1 K/uL  Clozapine (clozaril)     Status:  Abnormal   Collection Time: 12/19/15  3:45 PM  Result Value Ref Range   Clozapine Lvl 301 (L) 350 - 650 ng/mL    Comment:                **Please note reference interval change**   NorClozapine 158 Not Estab. ng/mL   Total(Cloz+Norcloz) 459 ng/mL    Comment: (NOTE) Patients dosed with 400 mg clozapine daily for 4 weeks were most likely to exhibit a therapeutic effect when the sum of clozapine and norclozapine concentrations were at least 450 ng/mL. Vira Agar, et al. [employer] Consensus Guidelines for Therapeutic Drug Monitoring in Psychiatry: Update 2011, Pharmacopsychiatry Sep 2011; 44(6):195-235.                                Detection Limit = 20 Performed At: [facility] [place], [place] [id number] Lindon Romp MD [id number]   Ammonia     Status: Abnormal   Collection Time: 12/26/15 11:15 AM  Result Value Ref Range   Ammonia 137 (H) 9 - 35 umol/L  CBC with Differential/Platelet     Status: Abnormal   Collection Time: 12/26/15 11:15 AM  Result Value Ref Range   WBC 8.4 3.6 - 11.0 K/uL   RBC 3.90 3.80 - 5.20 MIL/uL   Hemoglobin 11.8 (L) 12.0 - 16.0 g/dL   HCT 35.1 35.0 - 47.0 %   MCV 90.1 80.0 - 100.0 fL   MCH 30.4 26.0 - 34.0 pg   MCHC 33.7 32.0 - 36.0 g/dL   RDW 13.0 11.5 - 14.5 %   Platelets 184 150 - 440 K/uL   Neutrophils Relative % 80 %   Neutro Abs 6.8 (H) 1.4 - 6.5 K/uL   Lymphocytes Relative 12 %   Lymphs Abs 1.0 1.0 - 3.6 K/uL   Monocytes Relative 7 %   Monocytes Absolute 0.6 0.2 - 0.9 K/uL   Eosinophils Relative 1 %   Eosinophils Absolute 0.0 0 - 0.7 K/uL   Basophils Relative 0 %   Basophils Absolute 0.0 0 - 0.1 K/uL  Clozapine (clozaril)     Status: None   Collection Time: 12/26/15 11:15 AM  Result Value Ref Range   Clozapine Lvl 403 350 - 650 ng/mL    Comment:               **Please note reference interval change**   NorClozapine 209 Not Estab. ng/mL   Total(Cloz+Norcloz) 612 ng/mL    Comment: (NOTE) Patients dosed with 400 mg clozapine daily for 4 weeks were most likely to exhibit a therapeutic effect when the sum of clozapine and  norclozapine concentrations were at least 450 ng/mL. Vira Agar, et al. [employer] Consensus Guidelines for Therapeutic Drug Monitoring in Psychiatry: Update 2011, Pharmacopsychiatry Sep 2011; 44(6):195-235.                                Detection Limit = 20 Performed At: [facility] [place], [place] [id number] Lindon Romp MD [id number]   Valproic acid level     Status: Abnormal   Collection Time: 12/26/15 11:15 AM  Result Value Ref Range   Valproic Acid Lvl 102 (H) 50.0 - 100.0 ug/mL  CBC with Differential/Platelet     Status: Abnormal   Collection Time: 01/18/16  2:18 PM  Result Value Ref Range   WBC 12.5 (H) 3.4 - 10.8 x10E3/uL   RBC 4.37 3.77 - 5.28 x10E6/uL   Hemoglobin 13.3 11.1 - 15.9 g/dL   Hematocrit 41.1 34.0 - 46.6 %   MCV 94 79 - 97 fL   MCH 30.4 26.6 - 33.0 pg   MCHC 32.4 31.5 - 35.7 g/dL   RDW 13.5 12.3 - 15.4 %   Platelets 328 150 - 379 x10E3/uL   Neutrophils 74 %   Lymphs 17 %   Monocytes 8 %   Eos 1 %   Basos 0 %   Neutrophils Absolute 9.3 (H) 1.4 - 7.0 x10E3/uL   Lymphocytes Absolute 2.1 0.7 - 3.1 x10E3/uL   Monocytes Absolute 1.0 (H) 0.1 - 0.9 x10E3/uL   EOS (ABSOLUTE) 0.1 0.0 - 0.4 x10E3/uL   Basophils Absolute 0.1 0.0 - 0.2 x10E3/uL   Immature Granulocytes 0 %   Immature Grans (Abs) 0.0 0.0 - 0.1 x10E3/uL  Comprehensive metabolic panel     Status: None   Collection Time: 01/18/16  2:18 PM  Result Value Ref Range   Glucose 78 65 - 99 mg/dL   BUN 17 6 - 24 mg/dL   Creatinine, Ser 0.79 0.57 - 1.00 mg/dL   GFR calc non Af Amer 86 >59 mL/min/1.73   GFR calc Af Amer 99 >59 mL/min/1.73   BUN/Creatinine Ratio 22 9 - 23   Sodium 140 134 - 144 mmol/L   Potassium 4.3 3.5 - 5.2 mmol/L   Chloride 98 96 - 106 mmol/L   CO2 21 18 - 29 mmol/L   Calcium 9.3 8.7 - 10.2 mg/dL   Total Protein 6.6 6.0 - 8.5 g/dL   Albumin 4.4 3.5 - 5.5 g/dL   Globulin, Total 2.2 1.5 - 4.5 g/dL   Albumin/Globulin Ratio 2.0 1.1 - 2.5     Comment: **Effective February 12, 2016 the reference interval**   for A/G Ratio will be changing to:              Age                Female          Female           0 -  7 days       1.1 - 2.3       1.1 - 2.3           8 - 30 days       1.2 - 2.8       1.2 - 2.8           1 -  6 months     1.3 - 3.6       1.3 - 3.6    7 months -  5 years      1.5 - 2.6       1.5 - 2.6              > 5 years      1.2 - 2.2       1.2 - 2.2    Bilirubin Total <0.2 0.0 - 1.2 mg/dL   Alkaline Phosphatase 79 39 - 117 IU/L   AST 15 0 - 40 IU/L   ALT 14 0 - 32 IU/L  TSH     Status: None   Collection Time: 01/18/16  2:18 PM  Result Value Ref Range   TSH 2.290 0.450 - 4.500 uIU/mL  Vitamin B12  Status: Abnormal   Collection Time: 01/18/16  2:18 PM  Result Value Ref Range   Vitamin B-12 1069 (H) 211 - 946 pg/mL  VITAMIN D 25 Hydroxy (Vit-D Deficiency, Fractures)     Status: Abnormal   Collection Time: 01/18/16  2:18 PM  Result Value Ref Range   Vit D, 25-Hydroxy 13.3 (L) 30.0 - 100.0 ng/mL    Comment: Vitamin D deficiency has been defined by the [place] practice guideline as a level of serum 25-OH vitamin D less than 20 ng/mL (1,2). The Endocrine Society went on to further define vitamin D insufficiency as a level between 21 and 29 ng/mL (2). 1. IOM (Institute of Medicine). 2010. Dietary reference    intakes for calcium and D. [place]: The    [employer]. 2. Holick MF, Binkley Annabella, Bischoff-Ferrari HA, et al.    Evaluation, treatment, and prevention of vitamin D    deficiency: an Endocrine Society clinical practice    guideline. JCEM. 2011 Jul; 96(7):1911-30.      PHQ2/9: Depression screen [facility] 2/9 01/23/2016 12/21/2015 12/08/2015  Decreased Interest 0 1 0  Down, Depressed, Hopeless 2 3 0  PHQ - 2 Score 2 4 0  Altered sleeping 2 3 -  Tired, decreased energy 2 3 -  Change in appetite 0 2 -  Feeling bad or failure about yourself  1 1 -   Trouble concentrating 0 3 -  Moving slowly or fidgety/restless 0 2 -  Suicidal thoughts 0 0 -  PHQ-9 Score 7 18 -  Difficult doing work/chores - Very difficult -     Fall Risk: Fall Risk  01/23/2016 12/21/2015 12/08/2015  Falls in the past year? No No No     Functional Status Survey: Is the patient deaf or have difficulty hearing?: No Does the patient have difficulty seeing, even when wearing glasses/contacts?: No Does the patient have difficulty concentrating, remembering, or making decisions?: Yes (Currently) Does the patient have difficulty walking or climbing stairs?: No Does the patient have difficulty dressing or bathing?: No Does the patient have difficulty doing errands alone such as visiting a doctor's office or shopping?: No   Assessment & Plan  1. Leukocytosis  We will check for UTI, recheck CBC, if CBC still high and urine culture negative we will check CXR and stool samples - CBC with Differential/Platelet - Urine culture - POCT Urinalysis Dipstick  2. Fecal soiling  Offered to refer to GI, but husband would like to hold off for now

## 2016-01-24 LAB — CBC WITH DIFFERENTIAL/PLATELET
BASOS ABS: 0 10*3/uL (ref 0.0–0.2)
Basos: 0 %
EOS (ABSOLUTE): 0.1 10*3/uL (ref 0.0–0.4)
Eos: 1 %
HEMOGLOBIN: 12.3 g/dL (ref 11.1–15.9)
Hematocrit: 38.6 % (ref 34.0–46.6)
Immature Grans (Abs): 0 10*3/uL (ref 0.0–0.1)
Immature Granulocytes: 0 %
LYMPHS ABS: 2.4 10*3/uL (ref 0.7–3.1)
Lymphs: 24 %
MCH: 30.3 pg (ref 26.6–33.0)
MCHC: 31.9 g/dL (ref 31.5–35.7)
MCV: 95 fL (ref 79–97)
MONOCYTES: 6 %
MONOS ABS: 0.6 10*3/uL (ref 0.1–0.9)
NEUTROS ABS: 7.2 10*3/uL — AB (ref 1.4–7.0)
Neutrophils: 69 %
Platelets: 280 10*3/uL (ref 150–379)
RBC: 4.06 x10E6/uL (ref 3.77–5.28)
RDW: 13.7 % (ref 12.3–15.4)
WBC: 10.4 10*3/uL (ref 3.4–10.8)

## 2016-01-24 LAB — URINE CULTURE

## 2016-02-02 ENCOUNTER — Telehealth: Payer: Self-pay | Admitting: Family Medicine

## 2016-02-02 NOTE — Telephone Encounter (Signed)
Yes she can, I can also refer her back to GI if she would like

## 2016-02-02 NOTE — Telephone Encounter (Signed)
Patient was prescribed Lizness and is still having trouble with constipation. Would like to know if she can take a stool softner along with the Lizzness?

## 2016-02-05 NOTE — Telephone Encounter (Signed)
Spoke with husband about taking both medications at the same time and states they would like to hold off on the GI referral for now.

## 2016-02-12 ENCOUNTER — Telehealth: Payer: Self-pay | Admitting: Family Medicine

## 2016-02-12 NOTE — Telephone Encounter (Signed)
Patient is requesting that you fax her lab results to [facility] (CBC) Dr. Trula Slade. Stated that she has signed a medical release for [place] to share information (P) [phone] (F) [phone]

## 2016-02-12 NOTE — Telephone Encounter (Signed)
The requested information was faxed.

## 2016-02-20 ENCOUNTER — Ambulatory Visit (INDEPENDENT_AMBULATORY_CARE_PROVIDER_SITE_OTHER): Payer: Managed Care, Other (non HMO) | Admitting: Family Medicine

## 2016-02-20 ENCOUNTER — Encounter: Payer: Self-pay | Admitting: Family Medicine

## 2016-02-20 VITALS — BP 110/60 | HR 100 | Temp 97.9°F | Resp 14 | Wt 131.9 lb

## 2016-02-20 DIAGNOSIS — D72829 Elevated white blood cell count, unspecified: Secondary | ICD-10-CM

## 2016-02-20 DIAGNOSIS — K5900 Constipation, unspecified: Secondary | ICD-10-CM | POA: Diagnosis not present

## 2016-02-20 DIAGNOSIS — R151 Fecal smearing: Secondary | ICD-10-CM

## 2016-02-20 DIAGNOSIS — R000 Tachycardia, unspecified: Secondary | ICD-10-CM | POA: Diagnosis not present

## 2016-02-20 DIAGNOSIS — K5909 Other constipation: Secondary | ICD-10-CM

## 2016-02-20 MED ORDER — LINACLOTIDE 145 MCG PO CAPS: 145.0000 ug | ORAL_CAPSULE | Freq: Every day | ORAL | Status: DC

## 2016-02-20 MED ORDER — METOPROLOL TARTRATE 25 MG PO TABS: 12.5000 mg | ORAL_TABLET | Freq: Two times a day (BID) | ORAL | Status: DC

## 2016-02-20 NOTE — Progress Notes (Signed)
Name: Carla Thompson   MRN: [id number]    DOB: February 28, 1961   Date:02/20/2016       Progress Note  Subjective  Chief Complaint  Chief Complaint  Patient presents with  . Follow-up    leukocystosis  . Constipation    Linzess not helping.    HPI    Weight loss: she had lost 8 lbs right after her  discharge from psychiatric ward, but weight has been stable since. She has been eating at least 3 meals daily. She had a normal TSH while at [facility], CBC is back to normal. She has tachycardia , but improving now.   Chronic constipation: she is now on Linzess, no longer soiling, bowel movements about once or twice weekly, she still sits down to strain, and advised to no longer try to have a bowel movement daily, to try to use the bathroom only when she has the need to have a bowel movement.   Schizophrenia: she is still very worried about her bowel movements about the amount of food she eats daily, and is not able to function. Still not shopping on her own or cooking like she used to . Seeing psychiatrist . No longer has hallucinations.  Advised to focus on doing something like walking, cooking, cleaning.   Patient Active Problem List   Diagnosis Date Noted  . RLS (restless legs syndrome) 12/21/2015  . IBS (irritable bowel syndrome) 12/21/2015  . Other long term (current) drug therapy 12/08/2015  . Chronic constipation 12/08/2015  . Insomnia, persistent 12/08/2015  . Dermatitis, eczematoid 12/08/2015  . Gravida 2 para 2 12/08/2015  . Blood glucose elevated 12/08/2015  . Hypertriglyceridemia 12/08/2015  . Metallic taste 87/86/7672  . 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 Schizoaffective disorder, bipolar type ([place]) 12/08/2015    Past Surgical History  Procedure Laterality Date  . Tubal ligation  12/02/1994  . Anus surgery  2000    [place]    Family History  Problem Relation Age of Onset  . Mental illness Sister     Schizophrenia and bipolar  . Cancer Maternal Grandmother    Colon  . Mental illness Paternal Grandmother     Schizophrenia    Social History   Social History  . Marital Status: Married    Spouse Name: N/A  . Number of Children: N/A  . Years of Education: N/A   Occupational History  . Not on file.   Social History Main Topics  . Smoking status: Never Smoker   . Smokeless tobacco: Never Used  . Alcohol Use: No  . Drug Use: No  . Sexual Activity: Not Currently   Other Topics Concern  . Not on file   Social History Narrative     Current outpatient prescriptions:  .  B Complex-C (SUPER B COMPLEX PO), Take 1 tablet by mouth daily., Disp: , Rfl:  .  clonazePAM (KLONOPIN) 0.5 MG tablet, Take 0.5 mg by mouth at bedtime. , Disp: , Rfl:  .  cloZAPine (CLOZARIL) 100 MG tablet, Take 100-300 mg by mouth 2 (two) times daily. Pt takes one tablet in the morning and three tablets at bedtime., Disp: , Rfl:  .  docusate sodium (STOOL SOFTENER) 100 MG capsule, Take 100 mg by mouth 2 (two) times daily as needed for mild constipation. , Disp: , Rfl:  .  Linaclotide (LINZESS) 145 MCG CAPS capsule, Take 1 capsule (145 mcg total) by mouth daily., Disp: 30 capsule, Rfl: 2 .  metoprolol tartrate (LOPRESSOR) 25 MG tablet,  Take 0.5 tablets (12.5 mg total) by mouth 2 (two) times daily., Disp: 30 tablet, Rfl: 0 .  vitamin C (ASCORBIC ACID) 500 MG tablet, Take 500 mg by mouth daily., Disp: , Rfl:  .  Vitamin D, Ergocalciferol, (DRISDOL) 50000 units CAPS capsule, Take 1 capsule (50,000 Units total) by mouth every 7 (seven) days., Disp: 12 capsule, Rfl: 0 .  vitamin E 1000 UNIT capsule, Take 1,000 Units by mouth daily. , Disp: , Rfl:  .  Magnesium Gluconate 250 MG TABS, Take 250 mg by mouth daily. Reported on 02/20/2016, Disp: , Rfl:  .  Melatonin 5 MG TABS, Take 5 mg by mouth at bedtime as needed (for sleep). Reported on 02/20/2016, Disp: , Rfl:   Allergies  Allergen Reactions  . Escitalopram Other (See Comments)    Reaction:  Unknown   . Levsin [Hyoscyamine  Sulfate] Other (See Comments)    Reaction:  Unknown      ROS  Ten systems reviewed and is negative except as mentioned in HPI   Objective  Filed Vitals:   02/20/16 1156  BP: 110/60  Pulse: 100  Temp: 97.9 F (36.6 C)  TempSrc: Oral  Resp: 14  Weight: 131 lb 14.4 oz (59.829 kg)  SpO2: 97%    Body mass index is 21.3 kg/(m^2).  Physical Exam  Constitutional: Patient appears well-developed and well-nourished.  No distress.  HEENT: head atraumatic, normocephalic, pupils equal and reactive to light,  neck supple, throat within normal limits Cardiovascular: Normal rate, regular rhythm and normal heart sounds.  No murmur heard. No BLE edema. Pulmonary/Chest: Effort normal and breath sounds normal. No respiratory distress. Abdominal: Soft.  There is no tenderness. Psychiatric: Patient has a normal mood and affect. behavior is normal. Judgment and thought content normal.  Recent Results (from the past 2160 hour(s))  CBC with Differential/Platelet     Status: Abnormal   Collection Time: 11/27/15 12:03 PM  Result Value Ref Range   WBC 5.8 3.6 - 11.0 K/uL   RBC 4.02 3.80 - 5.20 MIL/uL   Hemoglobin 11.9 (L) 12.0 - 16.0 g/dL   HCT 36.5 35.0 - 47.0 %   MCV 90.8 80.0 - 100.0 fL   MCH 29.8 26.0 - 34.0 pg   MCHC 32.8 32.0 - 36.0 g/dL   RDW 12.9 11.5 - 14.5 %   Platelets 234 150 - 440 K/uL   Neutrophils Relative % 67 %   Neutro Abs 3.9 1.4 - 6.5 K/uL   Lymphocytes Relative 25 %   Lymphs Abs 1.5 1.0 - 3.6 K/uL   Monocytes Relative 5 %   Monocytes Absolute 0.3 0.2 - 0.9 K/uL   Eosinophils Relative 2 %   Eosinophils Absolute 0.1 0 - 0.7 K/uL   Basophils Relative 1 %   Basophils Absolute 0.0 0 - 0.1 K/uL  POC Hemoccult Bld/Stl (1-Cd Office Dx)     Status: Normal   Collection Time: 12/08/15  1:18 PM  Result Value Ref Range   Card #1 Date negative    Fecal Occult Blood, POC  Negative  Comprehensive metabolic panel     Status: Abnormal   Collection Time: 12/09/15  1:41 PM  Result  Value Ref Range   Sodium 142 135 - 145 mmol/L   Potassium 4.6 3.5 - 5.1 mmol/L   Chloride 109 101 - 111 mmol/L   CO2 24 22 - 32 mmol/L   Glucose, Bld 132 (H) 65 - 99 mg/dL   BUN 10 6 - 20 mg/dL  Creatinine, Ser 0.89 0.44 - 1.00 mg/dL   Calcium 9.8 8.9 - 10.3 mg/dL   Total Protein 8.0 6.5 - 8.1 g/dL   Albumin 4.7 3.5 - 5.0 g/dL   AST 15 15 - 41 U/L   ALT 17 14 - 54 U/L   Alkaline Phosphatase 81 38 - 126 U/L   Total Bilirubin 0.6 0.3 - 1.2 mg/dL   GFR calc non Af Amer >60 >60 mL/min   GFR calc Af Amer >60 >60 mL/min    Comment: (NOTE) The eGFR has been calculated using the CKD EPI equation. This calculation has not been validated in all clinical situations. eGFR's persistently <60 mL/min signify possible Chronic Kidney Disease.    Anion gap 9 5 - 15  Ethanol (ETOH)     Status: None   Collection Time: 12/09/15  1:41 PM  Result Value Ref Range   Alcohol, Ethyl (B) <5 <5 mg/dL    Comment:        LOWEST DETECTABLE LIMIT FOR SERUM ALCOHOL IS 5 mg/dL FOR MEDICAL PURPOSES ONLY   Salicylate level     Status: None   Collection Time: 12/09/15  1:41 PM  Result Value Ref Range   Salicylate Lvl <5.8 2.8 - 30.0 mg/dL  Acetaminophen level     Status: Abnormal   Collection Time: 12/09/15  1:41 PM  Result Value Ref Range   Acetaminophen (Tylenol), Serum <10 (L) 10 - 30 ug/mL    Comment:        THERAPEUTIC CONCENTRATIONS VARY SIGNIFICANTLY. A RANGE OF 10-30 ug/mL MAY BE AN EFFECTIVE CONCENTRATION FOR MANY PATIENTS. HOWEVER, SOME ARE BEST TREATED AT CONCENTRATIONS OUTSIDE THIS RANGE. ACETAMINOPHEN CONCENTRATIONS >150 ug/mL AT 4 HOURS AFTER INGESTION AND >50 ug/mL AT 12 HOURS AFTER INGESTION ARE OFTEN ASSOCIATED WITH TOXIC REACTIONS.   CBC     Status: None   Collection Time: 12/09/15  1:41 PM  Result Value Ref Range   WBC 10.1 3.6 - 11.0 K/uL   RBC 4.61 3.80 - 5.20 MIL/uL   Hemoglobin 13.8 12.0 - 16.0 g/dL   HCT 41.9 35.0 - 47.0 %   MCV 91.0 80.0 - 100.0 fL   MCH 30.0 26.0  - 34.0 pg   MCHC 33.0 32.0 - 36.0 g/dL   RDW 13.2 11.5 - 14.5 %   Platelets 339 150 - 440 K/uL  Differential     Status: Abnormal   Collection Time: 12/09/15  1:41 PM  Result Value Ref Range   Neutrophils Relative % 79 %   Neutro Abs 7.9 (H) 1.4 - 6.5 K/uL   Lymphocytes Relative 14 %   Lymphs Abs 1.4 1.0 - 3.6 K/uL   Monocytes Relative 7 %   Monocytes Absolute 0.7 0.2 - 0.9 K/uL   Eosinophils Relative 1 %   Eosinophils Absolute 0.0 0 - 0.7 K/uL   Basophils Relative 1 %   Basophils Absolute 0.1 0 - 0.1 K/uL  TSH     Status: None   Collection Time: 12/10/15  2:28 PM  Result Value Ref Range   TSH 1.831 0.350 - 4.500 uIU/mL  Hemoglobin A1c     Status: None   Collection Time: 12/10/15  2:28 PM  Result Value Ref Range   Hgb A1c MFr Bld 5.2 4.0 - 6.0 %  CK isoenzymes (brain, muscle injury)     Status: Abnormal   Collection Time: 12/10/15  2:28 PM  Result Value Ref Range   CK-MM 100 97 - 100 %   CK-MB 0  0 - 3 %   CK-BB 0 0 %    Comment: (NOTE) Performed At: [facility] [address], [place] [id number] Lindon Romp MD [id number]    Creatine Kinase-Total 230 (H) 24 - 173 U/L   Macro Type 1 0 Not Observed %   Macro Type 2 0 Not Observed %  Lipid panel     Status: None   Collection Time: 12/11/15 10:14 AM  Result Value Ref Range   Cholesterol 182 0 - 200 mg/dL   Triglycerides 80 <150 mg/dL   HDL 80 >40 mg/dL   Total CHOL/HDL Ratio 2.3 RATIO   VLDL 16 0 - 40 mg/dL   LDL Cholesterol 86 0 - 99 mg/dL    Comment:        Total Cholesterol/HDL:CHD Risk Coronary Heart Disease Risk Table                     Men   Women  1/2 Average Risk   3.4   3.3  Average Risk       5.0   4.4  2 X Average Risk   9.6   7.1  3 X Average Risk  23.4   11.0        Use the calculated Patient Ratio above and the CHD Risk Table to determine the patient's CHD Risk.        ATP III CLASSIFICATION (LDL):  <100     mg/dL   Optimal  100-129  mg/dL   Near or Above                     Optimal  130-159  mg/dL   Borderline  160-189  mg/dL   High  >190     mg/dL   Very High   Hemoglobin A1c     Status: None   Collection Time: 12/12/15  6:37 AM  Result Value Ref Range   Hgb A1c MFr Bld 5.0 4.0 - 6.0 %  Lipid panel, fasting     Status: None   Collection Time: 12/12/15  6:37 AM  Result Value Ref Range   Cholesterol 172 0 - 200 mg/dL   Triglycerides 74 <150 mg/dL   HDL 73 >40 mg/dL   Total CHOL/HDL Ratio 2.4 RATIO   VLDL 15 0 - 40 mg/dL   LDL Cholesterol 84 0 - 99 mg/dL    Comment:        Total Cholesterol/HDL:CHD Risk Coronary Heart Disease Risk Table                     Men   Women  1/2 Average Risk   3.4   3.3  Average Risk       5.0   4.4  2 X Average Risk   9.6   7.1  3 X Average Risk  23.4   11.0        Use the calculated Patient Ratio above and the CHD Risk Table to determine the patient's CHD Risk.        ATP III CLASSIFICATION (LDL):  <100     mg/dL   Optimal  100-129  mg/dL   Near or Above                    Optimal  130-159  mg/dL   Borderline  160-189  mg/dL   High  >190     mg/dL   Very High  TSH     Status: None   Collection Time: 12/12/15  6:37 AM  Result Value Ref Range   TSH 2.228 0.350 - 4.500 uIU/mL  Glucose, capillary     Status: None   Collection Time: 12/13/15 12:37 AM  Result Value Ref Range   Glucose-Capillary 97 65 - 99 mg/dL  Urinalysis complete, with microscopic ([facility] only)     Status: Abnormal   Collection Time: 12/15/15  3:45 AM  Result Value Ref Range   Color, Urine YELLOW (A) YELLOW   APPearance CLOUDY (A) CLEAR   Glucose, UA NEGATIVE NEGATIVE mg/dL   Bilirubin Urine NEGATIVE NEGATIVE   Ketones, ur NEGATIVE NEGATIVE mg/dL   Specific Gravity, Urine 1.009 1.005 - 1.030   Hgb urine dipstick 3+ (A) NEGATIVE   pH 6.0 5.0 - 8.0   Protein, ur NEGATIVE NEGATIVE mg/dL   Nitrite POSITIVE (A) NEGATIVE   Leukocytes, UA 1+ (A) NEGATIVE   RBC / HPF 6-30 0 - 5 RBC/hpf   WBC, UA 6-30 0 - 5 WBC/hpf   Bacteria, UA FEW (A)  NONE SEEN   Squamous Epithelial / LPF 0-5 (A) NONE SEEN  Valproic acid level     Status: None   Collection Time: 12/16/15  6:59 AM  Result Value Ref Range   Valproic Acid Lvl 89 50.0 - 100.0 ug/mL  CBC with Differential/Platelet     Status: None   Collection Time: 12/16/15  6:59 AM  Result Value Ref Range   WBC 6.6 3.6 - 11.0 K/uL   RBC 4.25 3.80 - 5.20 MIL/uL   Hemoglobin 12.7 12.0 - 16.0 g/dL   HCT 72.9 42.6 - 27.0 %   MCV 90.6 80.0 - 100.0 fL   MCH 29.9 26.0 - 34.0 pg   MCHC 33.0 32.0 - 36.0 g/dL   RDW 04.8 49.8 - 65.1 %   Platelets 265 150 - 440 K/uL   Neutrophils Relative % 64 %   Neutro Abs 4.2 1.4 - 6.5 K/uL   Lymphocytes Relative 20 %   Lymphs Abs 1.3 1.0 - 3.6 K/uL   Monocytes Relative 8 %   Monocytes Absolute 0.5 0.2 - 0.9 K/uL   Eosinophils Relative 6 %   Eosinophils Absolute 0.4 0 - 0.7 K/uL   Basophils Relative 2 %   Basophils Absolute 0.1 0 - 0.1 K/uL  Clozapine (clozaril)     Status: Abnormal   Collection Time: 12/19/15  3:45 PM  Result Value Ref Range   Clozapine Lvl 301 (L) 350 - 650 ng/mL    Comment:               **Please note reference interval change**   NorClozapine 158 Not Estab. ng/mL   Total(Cloz+Norcloz) 459 ng/mL    Comment: (NOTE) Patients dosed with 400 mg clozapine daily for 4 weeks were most likely to exhibit a therapeutic effect when the sum of clozapine and norclozapine concentrations were at least 450 ng/mL. Charlott Rakes, et al. Juel Burrow Consensus Guidelines for Therapeutic Drug Monitoring in Psychiatry: Update 2011, Pharmacopsychiatry Sep 2011; 44(6):195-235.                                Detection Limit = 20 Performed At: [facility] [address] [place], [place] [id number] Mila Homer MD [id number]   Ammonia     Status: Abnormal   Collection Time: 12/26/15 11:15 AM  Result Value Ref Range  Ammonia 137 (H) 9 - 35 umol/L  CBC with Differential/Platelet     Status: Abnormal   Collection Time:  12/26/15 11:15 AM  Result Value Ref Range   WBC 8.4 3.6 - 11.0 K/uL   RBC 3.90 3.80 - 5.20 MIL/uL   Hemoglobin 11.8 (L) 12.0 - 16.0 g/dL   HCT 35.1 35.0 - 47.0 %   MCV 90.1 80.0 - 100.0 fL   MCH 30.4 26.0 - 34.0 pg   MCHC 33.7 32.0 - 36.0 g/dL   RDW 13.0 11.5 - 14.5 %   Platelets 184 150 - 440 K/uL   Neutrophils Relative % 80 %   Neutro Abs 6.8 (H) 1.4 - 6.5 K/uL   Lymphocytes Relative 12 %   Lymphs Abs 1.0 1.0 - 3.6 K/uL   Monocytes Relative 7 %   Monocytes Absolute 0.6 0.2 - 0.9 K/uL   Eosinophils Relative 1 %   Eosinophils Absolute 0.0 0 - 0.7 K/uL   Basophils Relative 0 %   Basophils Absolute 0.0 0 - 0.1 K/uL  Clozapine (clozaril)     Status: None   Collection Time: 12/26/15 11:15 AM  Result Value Ref Range   Clozapine Lvl 403 350 - 650 ng/mL    Comment:               **Please note reference interval change**   NorClozapine 209 Not Estab. ng/mL   Total(Cloz+Norcloz) 612 ng/mL    Comment: (NOTE) Patients dosed with 400 mg clozapine daily for 4 weeks were most likely to exhibit a therapeutic effect when the sum of clozapine and norclozapine concentrations were at least 450 ng/mL. Vira Agar, et al. Rexford Maus Consensus Guidelines for Therapeutic Drug Monitoring in Psychiatry: Update 2011, Pharmacopsychiatry Sep 2011; 44(6):195-235.                                Detection Limit = 20 Performed At: [facility], [place] [id number] Lindon Romp MD [id number]   Valproic acid level     Status: Abnormal   Collection Time: 12/26/15 11:15 AM  Result Value Ref Range   Valproic Acid Lvl 102 (H) 50.0 - 100.0 ug/mL  CBC with Differential/Platelet     Status: Abnormal   Collection Time: 01/18/16  2:18 PM  Result Value Ref Range   WBC 12.5 (H) 3.4 - 10.8 x10E3/uL   RBC 4.37 3.77 - 5.28 x10E6/uL   Hemoglobin 13.3 11.1 - 15.9 g/dL   Hematocrit 41.1 34.0 - 46.6 %   MCV 94 79 - 97 fL   MCH 30.4 26.6 - 33.0 pg   MCHC 32.4 31.5  - 35.7 g/dL   RDW 13.5 12.3 - 15.4 %   Platelets 328 150 - 379 x10E3/uL   Neutrophils 74 %   Lymphs 17 %   Monocytes 8 %   Eos 1 %   Basos 0 %   Neutrophils Absolute 9.3 (H) 1.4 - 7.0 x10E3/uL   Lymphocytes Absolute 2.1 0.7 - 3.1 x10E3/uL   Monocytes Absolute 1.0 (H) 0.1 - 0.9 x10E3/uL   EOS (ABSOLUTE) 0.1 0.0 - 0.4 x10E3/uL   Basophils Absolute 0.1 0.0 - 0.2 x10E3/uL   Immature Granulocytes 0 %   Immature Grans (Abs) 0.0 0.0 - 0.1 x10E3/uL  Comprehensive metabolic panel     Status: None   Collection Time: 01/18/16  2:18 PM  Result Value Ref Range   Glucose  78 65 - 99 mg/dL   BUN 17 6 - 24 mg/dL   Creatinine, Ser 0.79 0.57 - 1.00 mg/dL   GFR calc non Af Amer 86 >59 mL/min/1.73   GFR calc Af Amer 99 >59 mL/min/1.73   BUN/Creatinine Ratio 22 9 - 23   Sodium 140 134 - 144 mmol/L   Potassium 4.3 3.5 - 5.2 mmol/L   Chloride 98 96 - 106 mmol/L   CO2 21 18 - 29 mmol/L   Calcium 9.3 8.7 - 10.2 mg/dL   Total Protein 6.6 6.0 - 8.5 g/dL   Albumin 4.4 3.5 - 5.5 g/dL   Globulin, Total 2.2 1.5 - 4.5 g/dL   Albumin/Globulin Ratio 2.0 1.1 - 2.5    Comment: **Effective February 12, 2016 the reference interval**   for A/G Ratio will be changing to:              Age                Female          Female           0 -  7 days       1.1 - 2.3       1.1 - 2.3           8 - 30 days       1.2 - 2.8       1.2 - 2.8           1 -  6 months     1.3 - 3.6       1.3 - 3.6    7 months -  5 years      1.5 - 2.6       1.5 - 2.6              > 5 years      1.2 - 2.2       1.2 - 2.2    Bilirubin Total <0.2 0.0 - 1.2 mg/dL   Alkaline Phosphatase 79 39 - 117 IU/L   AST 15 0 - 40 IU/L   ALT 14 0 - 32 IU/L  TSH     Status: None   Collection Time: 01/18/16  2:18 PM  Result Value Ref Range   TSH 2.290 0.450 - 4.500 uIU/mL  Vitamin B12     Status: Abnormal   Collection Time: 01/18/16  2:18 PM  Result Value Ref Range   Vitamin B-12 1069 (H) 211 - 946 pg/mL  VITAMIN D 25 Hydroxy (Vit-D Deficiency, Fractures)      Status: Abnormal   Collection Time: 01/18/16  2:18 PM  Result Value Ref Range   Vit D, 25-Hydroxy 13.3 (L) 30.0 - 100.0 ng/mL    Comment: Vitamin D deficiency has been defined by the Institute of Medicine and an Endocrine Society practice guideline as a level of serum 25-OH vitamin D less than 20 ng/mL (1,2). The Endocrine Society went on to further define vitamin D insufficiency as a level between 21 and 29 ng/mL (2). 1. IOM (Institute of Medicine). 2010. Dietary reference    intakes for calcium and D. [place]: The    [employer]. 2. Holick MF, Binkley Ferrum, Bischoff-Ferrari HA, et al.    Evaluation, treatment, and prevention of vitamin D    deficiency: an Endocrine Society clinical practice    guideline. JCEM. 2011 Jul; 96(7):1911-30.   Urine culture     Status: None  Collection Time: 01/23/16 12:00 AM  Result Value Ref Range   Urine Culture, Routine Final report    Urine Culture result 1 Comment     Comment: Mixed urogenital flora Less than 10,000 colonies/mL   CBC with Differential/Platelet     Status: Abnormal   Collection Time: 01/23/16 12:31 PM  Result Value Ref Range   WBC 10.4 3.4 - 10.8 x10E3/uL   RBC 4.06 3.77 - 5.28 x10E6/uL   Hemoglobin 12.3 11.1 - 15.9 g/dL   Hematocrit 38.6 34.0 - 46.6 %   MCV 95 79 - 97 fL   MCH 30.3 26.6 - 33.0 pg   MCHC 31.9 31.5 - 35.7 g/dL   RDW 13.7 12.3 - 15.4 %   Platelets 280 150 - 379 x10E3/uL   Neutrophils 69 %   Lymphs 24 %   Monocytes 6 %   Eos 1 %   Basos 0 %   Neutrophils Absolute 7.2 (H) 1.4 - 7.0 x10E3/uL   Lymphocytes Absolute 2.4 0.7 - 3.1 x10E3/uL   Monocytes Absolute 0.6 0.1 - 0.9 x10E3/uL   EOS (ABSOLUTE) 0.1 0.0 - 0.4 x10E3/uL   Basophils Absolute 0.0 0.0 - 0.2 x10E3/uL   Immature Granulocytes 0 %   Immature Grans (Abs) 0.0 0.0 - 0.1 x10E3/uL     PHQ2/9: Depression screen [facility] 2/9 02/20/2016 01/23/2016 12/21/2015 12/08/2015  Decreased Interest 0 0 1 0  Down, Depressed, Hopeless 0 2 3 0  PHQ -  2 Score 0 2 4 0  Altered sleeping - 2 3 -  Tired, decreased energy - 2 3 -  Change in appetite - 0 2 -  Feeling bad or failure about yourself  - 1 1 -  Trouble concentrating - 0 3 -  Moving slowly or fidgety/restless - 0 2 -  Suicidal thoughts - 0 0 -  PHQ-9 Score - 7 18 -  Difficult doing work/chores - - Very difficult -    Fall Risk: Fall Risk  02/20/2016 01/23/2016 12/21/2015 12/08/2015  Falls in the past year? Yes No No No  Number falls in past yr: 2 or more - - -  Injury with Fall? No - - -      Functional Status Survey: Is the patient deaf or have difficulty hearing?: No Does the patient have difficulty seeing, even when wearing glasses/contacts?: No Does the patient have difficulty concentrating, remembering, or making decisions?: No Does the patient have difficulty walking or climbing stairs?: No Does the patient have difficulty dressing or bathing?: No Does the patient have difficulty doing errands alone such as visiting a doctor's office or shopping?: No    Assessment & Plan  1. Chronic constipation  - Linaclotide (LINZESS) 145 MCG CAPS capsule; Take 1 capsule (145 mcg total) by mouth daily.  Dispense: 30 capsule; Refill: 2  2. Leukocytosis  Resolved  3. Fecal soiling  resolved  4. Tachycardia  - metoprolol tartrate (LOPRESSOR) 25 MG tablet; Take 0.5 tablets (12.5 mg total) by mouth 2 (two) times daily.  Dispense: 30 tablet; Refill: 2

## 2016-02-22 ENCOUNTER — Other Ambulatory Visit
Admission: RE | Admit: 2016-02-22 | Discharge: 2016-02-22 | Disposition: A | Discharge status: Home / Self Care | Payer: Managed Care, Other (non HMO) | Source: Ambulatory Visit | Attending: Psychiatry | Admitting: Psychiatry

## 2016-02-22 DIAGNOSIS — F259 Schizoaffective disorder, unspecified: Secondary | ICD-10-CM | POA: Diagnosis present

## 2016-02-22 DIAGNOSIS — Z7901 Long term (current) use of anticoagulants: Secondary | ICD-10-CM | POA: Diagnosis present

## 2016-02-22 LAB — CBC WITH DIFFERENTIAL/PLATELET
Basophils Absolute: 0 10*3/uL (ref 0–0.1)
Basophils Relative: 1 %
Eosinophils Absolute: 0.1 10*3/uL (ref 0–0.7)
Eosinophils Relative: 2 %
HEMATOCRIT: 39.3 % (ref 35.0–47.0)
HEMOGLOBIN: 13.1 g/dL (ref 12.0–16.0)
LYMPHS ABS: 1.3 10*3/uL (ref 1.0–3.6)
LYMPHS PCT: 19 %
MCH: 30.2 pg (ref 26.0–34.0)
MCHC: 33.2 g/dL (ref 32.0–36.0)
MCV: 91.1 fL (ref 80.0–100.0)
MONOS PCT: 5 %
Monocytes Absolute: 0.3 10*3/uL (ref 0.2–0.9)
NEUTROS ABS: 5 10*3/uL (ref 1.4–6.5)
NEUTROS PCT: 73 %
Platelets: 284 10*3/uL (ref 150–440)
RBC: 4.32 MIL/uL (ref 3.80–5.20)
RDW: 13.2 % (ref 11.5–14.5)
WBC: 6.8 10*3/uL (ref 3.6–11.0)

## 2016-03-28 ENCOUNTER — Other Ambulatory Visit
Admission: RE | Admit: 2016-03-28 | Discharge: 2016-03-28 | Disposition: A | Discharge status: Home / Self Care | Payer: Managed Care, Other (non HMO) | Source: Ambulatory Visit | Attending: Psychiatry | Admitting: Psychiatry

## 2016-03-28 DIAGNOSIS — F259 Schizoaffective disorder, unspecified: Secondary | ICD-10-CM | POA: Diagnosis present

## 2016-03-28 LAB — CBC WITH DIFFERENTIAL/PLATELET
BASOS ABS: 0 10*3/uL (ref 0–0.1)
Basophils Relative: 0 %
Eosinophils Absolute: 0.2 10*3/uL (ref 0–0.7)
Eosinophils Relative: 3 %
HCT: 36 % (ref 35.0–47.0)
Hemoglobin: 11.9 g/dL — ABNORMAL LOW (ref 12.0–16.0)
LYMPHS PCT: 20 %
Lymphs Abs: 1.7 10*3/uL (ref 1.0–3.6)
MCH: 30.1 pg (ref 26.0–34.0)
MCHC: 33.2 g/dL (ref 32.0–36.0)
MCV: 90.7 fL (ref 80.0–100.0)
Monocytes Absolute: 0.5 10*3/uL (ref 0.2–0.9)
Monocytes Relative: 6 %
NEUTROS ABS: 5.7 10*3/uL (ref 1.4–6.5)
NEUTROS PCT: 71 %
PLATELETS: 279 10*3/uL (ref 150–440)
RBC: 3.96 MIL/uL (ref 3.80–5.20)
RDW: 13.5 % (ref 11.5–14.5)
WBC: 8.2 10*3/uL (ref 3.6–11.0)

## 2016-03-28 LAB — LITHIUM LEVEL: LITHIUM LVL: 0.55 mmol/L — AB (ref 0.60–1.20)

## 2016-04-09 ENCOUNTER — Other Ambulatory Visit: Payer: Self-pay | Admitting: Family Medicine

## 2016-04-11 ENCOUNTER — Other Ambulatory Visit: Payer: Self-pay | Admitting: Family Medicine

## 2016-04-30 ENCOUNTER — Other Ambulatory Visit
Admission: RE | Admit: 2016-04-30 | Discharge: 2016-04-30 | Disposition: A | Discharge status: Home / Self Care | Payer: Managed Care, Other (non HMO) | Source: Ambulatory Visit | Attending: Psychiatry | Admitting: Psychiatry

## 2016-04-30 DIAGNOSIS — Z79899 Other long term (current) drug therapy: Secondary | ICD-10-CM | POA: Insufficient documentation

## 2016-04-30 LAB — CBC WITH DIFFERENTIAL/PLATELET
Basophils Absolute: 0 10*3/uL (ref 0–0.1)
Basophils Relative: 1 %
EOS ABS: 0.3 10*3/uL (ref 0–0.7)
HCT: 38.2 % (ref 35.0–47.0)
Hemoglobin: 12.6 g/dL (ref 12.0–16.0)
LYMPHS ABS: 2.1 10*3/uL (ref 1.0–3.6)
MCH: 30.2 pg (ref 26.0–34.0)
MCHC: 32.9 g/dL (ref 32.0–36.0)
MCV: 91.8 fL (ref 80.0–100.0)
MONO ABS: 0.5 10*3/uL (ref 0.2–0.9)
Neutro Abs: 5.7 10*3/uL (ref 1.4–6.5)
Neutrophils Relative %: 65 %
PLATELETS: 247 10*3/uL (ref 150–440)
RBC: 4.16 MIL/uL (ref 3.80–5.20)
RDW: 12.8 % (ref 11.5–14.5)
WBC: 8.7 10*3/uL (ref 3.6–11.0)

## 2016-04-30 LAB — LITHIUM LEVEL: LITHIUM LVL: 0.59 mmol/L — AB (ref 0.60–1.20)

## 2016-05-14 ENCOUNTER — Other Ambulatory Visit: Payer: Self-pay | Admitting: Family Medicine

## 2016-05-14 NOTE — Telephone Encounter (Signed)
Patient requesting refill. 

## 2016-05-22 ENCOUNTER — Encounter: Payer: Self-pay | Admitting: Family Medicine

## 2016-05-22 ENCOUNTER — Ambulatory Visit (INDEPENDENT_AMBULATORY_CARE_PROVIDER_SITE_OTHER): Payer: Managed Care, Other (non HMO) | Admitting: Family Medicine

## 2016-05-22 VITALS — BP 110/64 | HR 91 | Temp 98.6°F | Resp 16 | Ht 66.0 in | Wt 143.4 lb

## 2016-05-22 DIAGNOSIS — F250 Schizoaffective disorder, bipolar type: Secondary | ICD-10-CM | POA: Diagnosis not present

## 2016-05-22 DIAGNOSIS — K589 Irritable bowel syndrome without diarrhea: Secondary | ICD-10-CM | POA: Diagnosis not present

## 2016-05-22 DIAGNOSIS — K5909 Other constipation: Secondary | ICD-10-CM

## 2016-05-22 DIAGNOSIS — R000 Tachycardia, unspecified: Secondary | ICD-10-CM | POA: Diagnosis not present

## 2016-05-22 DIAGNOSIS — K5900 Constipation, unspecified: Secondary | ICD-10-CM | POA: Diagnosis not present

## 2016-05-22 MED ORDER — LINACLOTIDE 145 MCG PO CAPS: 145.0000 ug | ORAL_CAPSULE | Freq: Every day | ORAL | Status: DC

## 2016-05-22 MED ORDER — METOPROLOL TARTRATE 25 MG PO TABS: 12.5000 mg | ORAL_TABLET | Freq: Two times a day (BID) | ORAL | Status: DC

## 2016-05-22 NOTE — Progress Notes (Signed)
Name: Carla Thompson   MRN: [id number]    DOB: [birth year]   Date:05/22/2016       Progress Note  Subjective  Chief Complaint  Chief Complaint  Patient presents with  . Follow-up    patient is here for her 56-month f/u. patient stated that she feels better. she also stated that she has began to eat a little more.  . Medication Refill    patient stated that Linzess cost about $200.    HPI  Weight loss: she had lost 8 lbs right after her discharge from psychiatric ward, she is finally feeling better and is gaining weight again. She states hunger pain has resolved.  She has a good appetite.   Chronic constipation: she is now on Linzess, no longer soiling, bowel movements about 2 times weekly, very seldom she has abdominal pain, but doing better, no blood or mucus in her stools.   Schizophrenia: Seeing psychiatrist . No longer has hallucinations.She cooking and cleaning again. Able to have friends over. Oldest daughter got married and she was not invited to the wedding, but she is not sad, only mad.   Insomnia: she has been sleeping well now. Able to fall and stay asleep.   Patient Active Problem List   Diagnosis Date Noted  . Tachycardia 02/20/2016  . RLS (restless legs syndrome) 12/21/2015  . IBS (irritable bowel syndrome) 12/21/2015  . Other long term (current) drug therapy 12/08/2015  . Chronic constipation 12/08/2015  . Insomnia, persistent 12/08/2015  . Dermatitis, eczematoid 12/08/2015  . Gravida 2 para 2 12/08/2015  . Hypertriglyceridemia 12/08/2015  . Metallic taste 99991111  . 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 Schizoaffective disorder, bipolar type ([place]) 12/08/2015    Past Surgical History  Procedure Laterality Date  . Tubal ligation  12/02/1994  . Anus surgery  2000    [place]    Family History  Problem Relation Age of Onset  . Mental illness Sister     Schizophrenia and bipolar  . Cancer Maternal Grandmother     Colon  . Mental illness Paternal  Grandmother     Schizophrenia    Social History   Social History  . Marital Status: Married    Spouse Name: N/A  . Number of Children: N/A  . Years of Education: N/A   Occupational History  . Not on file.   Social History Main Topics  . Smoking status: Never Smoker   . Smokeless tobacco: Never Used  . Alcohol Use: No  . Drug Use: No  . Sexual Activity: Not Currently   Other Topics Concern  . Not on file   Social History Narrative     Current outpatient prescriptions:  .  B Complex-C (SUPER B COMPLEX PO), Take 1 tablet by mouth daily., Disp: , Rfl:  .  clonazePAM (KLONOPIN) 0.5 MG tablet, Take 0.5 mg by mouth at bedtime. , Disp: , Rfl:  .  cloZAPine (CLOZARIL) 100 MG tablet, Take 100-300 mg by mouth 2 (two) times daily. Pt takes one tablet in the morning and three tablets at bedtime., Disp: , Rfl:  .  docusate sodium (STOOL SOFTENER) 100 MG capsule, Take 100 mg by mouth 2 (two) times daily as needed for mild constipation. , Disp: , Rfl:  .  linaclotide (LINZESS) 145 MCG CAPS capsule, Take 1 capsule (145 mcg total) by mouth daily., Disp: 30 capsule, Rfl: 2 .  lithium carbonate 300 MG capsule, TK 2 CS PO QPM, Disp: , Rfl: 0 .  Magnesium  Gluconate 250 MG TABS, Take 250 mg by mouth daily. Reported on 02/20/2016, Disp: , Rfl:  .  Melatonin 5 MG TABS, Take 5 mg by mouth at bedtime as needed (for sleep). Reported on 02/20/2016, Disp: , Rfl:  .  metoprolol tartrate (LOPRESSOR) 25 MG tablet, Take 0.5 tablets (12.5 mg total) by mouth 2 (two) times daily., Disp: 30 tablet, Rfl: 2 .  vitamin C (ASCORBIC ACID) 500 MG tablet, Take 500 mg by mouth daily., Disp: , Rfl:  .  Vitamin D, Ergocalciferol, (DRISDOL) 50000 units CAPS capsule, TAKE 1 CAPSULE BY MOUTH EVERY 7 DAYS, Disp: 12 capsule, Rfl: 0 .  vitamin E 1000 UNIT capsule, Take 1,000 Units by mouth daily. , Disp: , Rfl:   Allergies  Allergen Reactions  . Escitalopram Other (See Comments)    Reaction:  Unknown   . Levsin [Hyoscyamine  Sulfate] Other (See Comments)    Reaction:  Unknown      ROS  Constitutional: Negative for fever, positive  weight change.  Respiratory: Negative for cough and shortness of breath.   Cardiovascular: Negative for chest pain or palpitations.  Gastrointestinal: Negative for abdominal pain, no bowel changes.  Musculoskeletal: Negative for gait problem or joint swelling.  Skin: Negative for rash.  Neurological: Negative for dizziness or headache.  No other specific complaints in a complete review of systems (except as listed in HPI above).   Objective  Filed Vitals:   05/22/16 1154  BP: 110/64  Pulse: 91  Temp: 98.6 F (37 C)  TempSrc: Oral  Resp: 16  Height: 5\' 6"  (1.676 m)  Weight: 143 lb 6.4 oz (65.046 kg)  SpO2: 97%    Body mass index is 23.16 kg/(m^2).  Physical Exam  Constitutional: Patient appears well-developed and well-nourished.well-dressed , lipstick only  Obese No distress.  HEENT: head atraumatic, normocephalic, pupils equal and reactive to light, neck supple, throat within normal limits Cardiovascular: Normal rate, regular rhythm and normal heart sounds.  No murmur heard. No BLE edema. Pulmonary/Chest: Effort normal and breath sounds normal. No respiratory distress. Abdominal: Soft.  There is no tenderness. Psychiatric: Patient has a normal mood and affect. behavior is normal. Judgment and thought content normal.  Recent Results (from the past 2160 hour(s))  Lithium level     Status: Abnormal   Collection Time: 03/28/16  1:02 PM  Result Value Ref Range   Lithium Lvl 0.55 (L) 0.60 - 1.20 mmol/L  CBC with Differential/Platelet     Status: Abnormal   Collection Time: 03/28/16  1:02 PM  Result Value Ref Range   WBC 8.2 3.6 - 11.0 K/uL   RBC 3.96 3.80 - 5.20 MIL/uL   Hemoglobin 11.9 (L) 12.0 - 16.0 g/dL   HCT 36.0 35.0 - 47.0 %   MCV 90.7 80.0 - 100.0 fL   MCH 30.1 26.0 - 34.0 pg   MCHC 33.2 32.0 - 36.0 g/dL   RDW 13.5 11.5 - 14.5 %   Platelets 279 150 -  440 K/uL   Neutrophils Relative % 71 %   Neutro Abs 5.7 1.4 - 6.5 K/uL   Lymphocytes Relative 20 %   Lymphs Abs 1.7 1.0 - 3.6 K/uL   Monocytes Relative 6 %   Monocytes Absolute 0.5 0.2 - 0.9 K/uL   Eosinophils Relative 3 %   Eosinophils Absolute 0.2 0 - 0.7 K/uL   Basophils Relative 0 %   Basophils Absolute 0.0 0 - 0.1 K/uL  CBC with Differential/Platelet     Status: None  Collection Time: 04/30/16 12:41 PM  Result Value Ref Range   WBC 8.7 3.6 - 11.0 K/uL   RBC 4.16 3.80 - 5.20 MIL/uL   Hemoglobin 12.6 12.0 - 16.0 g/dL   HCT 38.2 35.0 - 47.0 %   MCV 91.8 80.0 - 100.0 fL   MCH 30.2 26.0 - 34.0 pg   MCHC 32.9 32.0 - 36.0 g/dL   RDW 12.8 11.5 - 14.5 %   Platelets 247 150 - 440 K/uL   Neutrophils Relative % 65% %   Neutro Abs 5.7 1.4 - 6.5 K/uL   Lymphocytes Relative 24% %   Lymphs Abs 2.1 1.0 - 3.6 K/uL   Monocytes Relative 6% %   Monocytes Absolute 0.5 0.2 - 0.9 K/uL   Eosinophils Relative 4% %   Eosinophils Absolute 0.3 0 - 0.7 K/uL   Basophils Relative 1% %   Basophils Absolute 0.0 0 - 0.1 K/uL  Lithium level     Status: Abnormal   Collection Time: 04/30/16 12:41 PM  Result Value Ref Range   Lithium Lvl 0.59 (L) 0.60 - 1.20 mmol/L     PHQ2/9: Depression screen [facility] 2/9 05/22/2016 02/20/2016 01/23/2016 12/21/2015 12/08/2015  Decreased Interest 0 0 0 1 0  Down, Depressed, Hopeless 1 0 2 3 0  PHQ - 2 Score 1 0 2 4 0  Altered sleeping - - 2 3 -  Tired, decreased energy - - 2 3 -  Change in appetite - - 0 2 -  Feeling bad or failure about yourself  - - 1 1 -  Trouble concentrating - - 0 3 -  Moving slowly or fidgety/restless - - 0 2 -  Suicidal thoughts - - 0 0 -  PHQ-9 Score - - 7 18 -  Difficult doing work/chores - - - Very difficult -     Fall Risk: Fall Risk  05/22/2016 02/20/2016 01/23/2016 12/21/2015 12/08/2015  Falls in the past year? No Yes No No No  Number falls in past yr: - 2 or more - - -  Injury with Fall? - No - - -      Functional Status Survey: Is  the patient deaf or have difficulty hearing?: No Does the patient have difficulty seeing, even when wearing glasses/contacts?: No Does the patient have difficulty concentrating, remembering, or making decisions?: Yes Does the patient have difficulty walking or climbing stairs?: No Does the patient have difficulty dressing or bathing?: No Does the patient have difficulty doing errands alone such as visiting a doctor's office or shopping?: Yes    Assessment & Plan  1. Chronic constipation  - linaclotide (LINZESS) 145 MCG CAPS capsule; Take 1 capsule (145 mcg total) by mouth daily.  Dispense: 30 capsule; Refill: 2  2. Tachycardia  - metoprolol tartrate (LOPRESSOR) 25 MG tablet; Take 0.5 tablets (12.5 mg total) by mouth 2 (two) times daily.  Dispense: 30 tablet; Refill: 2  3. Schizoaffective disorder, bipolar type ([place])  Doing well, lithium seems to be helping  4. IBS (irritable bowel syndrome)  Continue medication

## 2016-05-30 ENCOUNTER — Other Ambulatory Visit
Admission: RE | Admit: 2016-05-30 | Discharge: 2016-05-30 | Disposition: A | Discharge status: Home / Self Care | Payer: Managed Care, Other (non HMO) | Source: Ambulatory Visit | Attending: Psychiatry | Admitting: Psychiatry

## 2016-05-30 DIAGNOSIS — Z79899 Other long term (current) drug therapy: Secondary | ICD-10-CM | POA: Diagnosis present

## 2016-05-30 DIAGNOSIS — F250 Schizoaffective disorder, bipolar type: Secondary | ICD-10-CM | POA: Diagnosis present

## 2016-05-30 LAB — CBC WITH DIFFERENTIAL/PLATELET
BASOS ABS: 0.1 10*3/uL (ref 0–0.1)
BASOS PCT: 1 %
Eosinophils Absolute: 0.3 10*3/uL (ref 0–0.7)
Eosinophils Relative: 4 %
HCT: 36.2 % (ref 35.0–47.0)
HEMOGLOBIN: 12.3 g/dL (ref 12.0–16.0)
Lymphocytes Relative: 25 %
Lymphs Abs: 2.2 10*3/uL (ref 1.0–3.6)
MCH: 30.6 pg (ref 26.0–34.0)
MCHC: 33.9 g/dL (ref 32.0–36.0)
MCV: 90.1 fL (ref 80.0–100.0)
MONO ABS: 0.5 10*3/uL (ref 0.2–0.9)
Monocytes Relative: 6 %
NEUTROS ABS: 5.7 10*3/uL (ref 1.4–6.5)
NEUTROS PCT: 64 %
PLATELETS: 238 10*3/uL (ref 150–440)
RBC: 4.01 MIL/uL (ref 3.80–5.20)
RDW: 13.3 % (ref 11.5–14.5)
WBC: 8.8 10*3/uL (ref 3.6–11.0)

## 2016-05-30 LAB — LITHIUM LEVEL: LITHIUM LVL: 0.6 mmol/L (ref 0.60–1.20)

## 2016-06-26 ENCOUNTER — Other Ambulatory Visit
Admission: RE | Admit: 2016-06-26 | Discharge: 2016-06-26 | Disposition: A | Discharge status: Home / Self Care | Payer: Managed Care, Other (non HMO) | Source: Ambulatory Visit | Attending: Psychiatry | Admitting: Psychiatry

## 2016-06-26 DIAGNOSIS — F259 Schizoaffective disorder, unspecified: Secondary | ICD-10-CM | POA: Insufficient documentation

## 2016-06-26 DIAGNOSIS — Z79899 Other long term (current) drug therapy: Secondary | ICD-10-CM | POA: Insufficient documentation

## 2016-06-26 LAB — CBC WITH DIFFERENTIAL/PLATELET
Basophils Absolute: 0.1 10*3/uL (ref 0–0.1)
Basophils Relative: 1 %
EOS PCT: 3 %
Eosinophils Absolute: 0.2 10*3/uL (ref 0–0.7)
HCT: 36.8 % (ref 35.0–47.0)
Hemoglobin: 12.6 g/dL (ref 12.0–16.0)
LYMPHS ABS: 2 10*3/uL (ref 1.0–3.6)
LYMPHS PCT: 24 %
MCH: 31.3 pg (ref 26.0–34.0)
MCHC: 34.2 g/dL (ref 32.0–36.0)
MCV: 91.7 fL (ref 80.0–100.0)
MONO ABS: 0.5 10*3/uL (ref 0.2–0.9)
MONOS PCT: 6 %
Neutro Abs: 5.5 10*3/uL (ref 1.4–6.5)
Neutrophils Relative %: 66 %
PLATELETS: 252 10*3/uL (ref 150–440)
RBC: 4.02 MIL/uL (ref 3.80–5.20)
RDW: 13.2 % (ref 11.5–14.5)
WBC: 8.2 10*3/uL (ref 3.6–11.0)

## 2016-06-26 LAB — LITHIUM LEVEL: Lithium Lvl: 0.6 mmol/L (ref 0.60–1.20)

## 2016-07-11 ENCOUNTER — Telehealth: Payer: Self-pay | Admitting: Family Medicine

## 2016-07-11 NOTE — Telephone Encounter (Signed)
Please advise 

## 2016-07-11 NOTE — Telephone Encounter (Signed)
Is she taking the medications for constipation?  If she is , advise her to stop taking it for a few day

## 2016-07-11 NOTE — Telephone Encounter (Signed)
Patient notified to stop Linzess until symptoms resolve. Patient states she will call back if it does not stop within a few days.

## 2016-07-16 ENCOUNTER — Other Ambulatory Visit: Payer: Self-pay | Admitting: Family Medicine

## 2016-07-16 DIAGNOSIS — Z1231 Encounter for screening mammogram for malignant neoplasm of breast: Secondary | ICD-10-CM

## 2016-07-19 ENCOUNTER — Telehealth: Payer: Self-pay | Admitting: Family Medicine

## 2016-07-19 ENCOUNTER — Other Ambulatory Visit: Payer: Self-pay | Admitting: Family Medicine

## 2016-07-19 MED ORDER — VITAMIN D (ERGOCALCIFEROL) 1.25 MG (50000 UNIT) PO CAPS: ORAL_CAPSULE | ORAL | 0 refills | Status: DC

## 2016-07-19 NOTE — Telephone Encounter (Signed)
Needs refill of her Vitamin D.

## 2016-07-25 ENCOUNTER — Other Ambulatory Visit
Admission: RE | Admit: 2016-07-25 | Discharge: 2016-07-25 | Disposition: A | Discharge status: Home / Self Care | Payer: Managed Care, Other (non HMO) | Source: Ambulatory Visit | Attending: Psychiatry | Admitting: Psychiatry

## 2016-07-25 ENCOUNTER — Ambulatory Visit
Admission: RE | Admit: 2016-07-25 | Discharge: 2016-07-25 | Disposition: A | Discharge status: Home / Self Care | Payer: Managed Care, Other (non HMO) | Source: Ambulatory Visit | Attending: Family Medicine | Admitting: Family Medicine

## 2016-07-25 DIAGNOSIS — Z79899 Other long term (current) drug therapy: Secondary | ICD-10-CM | POA: Insufficient documentation

## 2016-07-25 DIAGNOSIS — F250 Schizoaffective disorder, bipolar type: Secondary | ICD-10-CM | POA: Diagnosis present

## 2016-07-25 DIAGNOSIS — Z1231 Encounter for screening mammogram for malignant neoplasm of breast: Secondary | ICD-10-CM | POA: Insufficient documentation

## 2016-07-25 LAB — CBC WITH DIFFERENTIAL/PLATELET
BASOS PCT: 1 %
Basophils Absolute: 0.1 10*3/uL (ref 0–0.1)
Eosinophils Absolute: 0.3 10*3/uL (ref 0–0.7)
Eosinophils Relative: 4 %
HEMATOCRIT: 36.1 % (ref 35.0–47.0)
HEMOGLOBIN: 12.5 g/dL (ref 12.0–16.0)
LYMPHS ABS: 2.5 10*3/uL (ref 1.0–3.6)
LYMPHS PCT: 31 %
MCH: 31.6 pg (ref 26.0–34.0)
MCHC: 34.6 g/dL (ref 32.0–36.0)
MCV: 91.3 fL (ref 80.0–100.0)
MONOS PCT: 6 %
Monocytes Absolute: 0.5 10*3/uL (ref 0.2–0.9)
NEUTROS ABS: 4.6 10*3/uL (ref 1.4–6.5)
NEUTROS PCT: 58 %
Platelets: 240 10*3/uL (ref 150–440)
RBC: 3.96 MIL/uL (ref 3.80–5.20)
RDW: 12.7 % (ref 11.5–14.5)
WBC: 8 10*3/uL (ref 3.6–11.0)

## 2016-07-25 LAB — LITHIUM LEVEL: Lithium Lvl: 0.6 mmol/L (ref 0.60–1.20)

## 2016-08-23 ENCOUNTER — Other Ambulatory Visit
Admission: RE | Admit: 2016-08-23 | Discharge: 2016-08-23 | Disposition: A | Discharge status: Home / Self Care | Payer: Managed Care, Other (non HMO) | Source: Ambulatory Visit | Attending: Psychiatry | Admitting: Psychiatry

## 2016-08-23 ENCOUNTER — Ambulatory Visit (INDEPENDENT_AMBULATORY_CARE_PROVIDER_SITE_OTHER): Payer: Managed Care, Other (non HMO) | Admitting: Family Medicine

## 2016-08-23 ENCOUNTER — Encounter: Payer: Self-pay | Admitting: Family Medicine

## 2016-08-23 VITALS — BP 108/62 | HR 102 | Temp 97.9°F | Resp 16 | Ht 66.0 in | Wt 142.4 lb

## 2016-08-23 DIAGNOSIS — Z79899 Other long term (current) drug therapy: Secondary | ICD-10-CM | POA: Diagnosis not present

## 2016-08-23 DIAGNOSIS — R000 Tachycardia, unspecified: Secondary | ICD-10-CM | POA: Diagnosis not present

## 2016-08-23 DIAGNOSIS — K589 Irritable bowel syndrome without diarrhea: Secondary | ICD-10-CM | POA: Diagnosis not present

## 2016-08-23 DIAGNOSIS — F259 Schizoaffective disorder, unspecified: Secondary | ICD-10-CM | POA: Diagnosis present

## 2016-08-23 DIAGNOSIS — F250 Schizoaffective disorder, bipolar type: Secondary | ICD-10-CM

## 2016-08-23 DIAGNOSIS — K5900 Constipation, unspecified: Secondary | ICD-10-CM

## 2016-08-23 DIAGNOSIS — K5909 Other constipation: Secondary | ICD-10-CM

## 2016-08-23 LAB — CBC WITH DIFFERENTIAL/PLATELET
Basophils Absolute: 0.1 10*3/uL (ref 0–0.1)
Basophils Relative: 1 %
EOS ABS: 0.3 10*3/uL (ref 0–0.7)
EOS PCT: 4 %
HCT: 35.7 % (ref 35.0–47.0)
Hemoglobin: 12.4 g/dL (ref 12.0–16.0)
LYMPHS ABS: 2.2 10*3/uL (ref 1.0–3.6)
LYMPHS PCT: 29 %
MCH: 31.7 pg (ref 26.0–34.0)
MCHC: 34.7 g/dL (ref 32.0–36.0)
MCV: 91.2 fL (ref 80.0–100.0)
Monocytes Absolute: 0.5 10*3/uL (ref 0.2–0.9)
Monocytes Relative: 7 %
NEUTROS PCT: 59 %
Neutro Abs: 4.5 10*3/uL (ref 1.4–6.5)
PLATELETS: 236 10*3/uL (ref 150–440)
RBC: 3.91 MIL/uL (ref 3.80–5.20)
RDW: 12.4 % (ref 11.5–14.5)
WBC: 7.5 10*3/uL (ref 3.6–11.0)

## 2016-08-23 LAB — LITHIUM LEVEL: LITHIUM LVL: 0.62 mmol/L (ref 0.60–1.20)

## 2016-08-23 MED ORDER — METOPROLOL TARTRATE 25 MG PO TABS: 12.5000 mg | ORAL_TABLET | Freq: Two times a day (BID) | ORAL | 1 refills | Status: DC

## 2016-08-23 MED ORDER — LINACLOTIDE 145 MCG PO CAPS: 145.0000 ug | ORAL_CAPSULE | Freq: Every day | ORAL | 1 refills | Status: DC

## 2016-08-23 NOTE — Progress Notes (Signed)
Name: Carla Thompson   MRN: [id number]    DOB: Sep 08, 1961   Date:08/23/2016       Progress Note  Subjective  Chief Complaint  Chief Complaint  Patient presents with  . Medication Refill    3 month F/U  . Constipation    Feels like Linzess is helping symptoms and has been going 8 or 9 times this past week, and eating more fruits and vegetables.   . Insomnia    Has been taking Clozapine and Clonazepam and helps her fall asleep. Patient feels like she is having a metallic taste in her mouth due to her medication. Sleeps 8-10 hours a night  . Schizophrenia    Dr. Clovis Pu and doing well with her appts. Seeing him again next month.     HPI  Chronic constipation: she is now on Linzess, no longer soiling, bowel movements are better about once a day and denies abdominal pain except for when she skips bowel movements. She has increased fiber in her diet and has been drinking a lot of water.  Schizophrenia: Seeing psychiatrist . No longer has hallucinations.She has been cooking and cleaning again. Able to have friends over. Husband is here with her and thinks she is back to baseline  Insomnia: she has been sleeping well now. Able to fall and stay asleep.  Tachycardia: doing well on Lopressor half pill twice daily, she denies SOB or chest pain    Patient Active Problem List   Diagnosis Date Noted  . Tachycardia 02/20/2016  . RLS (restless legs syndrome) 12/21/2015  . IBS (irritable bowel syndrome) 12/21/2015  . Other long term (current) drug therapy 12/08/2015  . Chronic constipation 12/08/2015  . Insomnia, persistent 12/08/2015  . Dermatitis, eczematoid 12/08/2015  . Gravida 2 para 2 12/08/2015  . Hypertriglyceridemia 12/08/2015  . Metallic taste 99991111  . 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 Schizoaffective disorder, bipolar type ([place]) 12/08/2015    Past Surgical History:  Procedure Laterality Date  . ANUS SURGERY  2000   [place]  . TUBAL LIGATION  12/02/1994     Family History  Problem Relation Age of Onset  . Mental illness Sister     Schizophrenia and bipolar  . Cancer Maternal Grandmother     Colon  . Mental illness Paternal Grandmother     Schizophrenia  . Breast cancer Neg Hx     Social History   Social History  . Marital status: Married    Spouse name: N/A  . Number of children: N/A  . Years of education: N/A   Occupational History  . Not on file.   Social History Main Topics  . Smoking status: Never Smoker  . Smokeless tobacco: Never Used  . Alcohol use No  . Drug use: No  . Sexual activity: Not Currently   Other Topics Concern  . Not on file   Social History Narrative  . No narrative on file     Current Outpatient Prescriptions:  .  B Complex-C (SUPER B COMPLEX PO), Take 1 tablet by mouth daily., Disp: , Rfl:  .  clonazePAM (KLONOPIN) 0.5 MG tablet, Take 0.5 mg by mouth at bedtime., Disp: , Rfl:  .  cloZAPine (CLOZARIL) 100 MG tablet, Take 100-300 mg by mouth 2 (two) times daily. Pt takes one tablet in the morning and three tablets at bedtime. , Disp: , Rfl:  .  docusate sodium (STOOL SOFTENER) 100 MG capsule, Take 100 mg by mouth 2 (two) times daily as needed for  mild constipation. , Disp: , Rfl:  .  linaclotide (LINZESS) 145 MCG CAPS capsule, Take 1 capsule (145 mcg total) by mouth daily., Disp: 90 capsule, Rfl: 1 .  lithium carbonate 300 MG capsule, TK 2 CS PO QPM, Disp: , Rfl: 0 .  metoprolol tartrate (LOPRESSOR) 25 MG tablet, Take 0.5 tablets (12.5 mg total) by mouth 2 (two) times daily., Disp: 90 tablet, Rfl: 1 .  vitamin C (ASCORBIC ACID) 500 MG tablet, Take 500 mg by mouth daily., Disp: , Rfl:  .  Vitamin D, Ergocalciferol, (DRISDOL) 50000 units CAPS capsule, TAKE 1 CAPSULE BY MOUTH EVERY 7 DAYS, Disp: 12 capsule, Rfl: 0 .  vitamin E 1000 UNIT capsule, Take 1,000 Units by mouth daily. , Disp: , Rfl:   Allergies  Allergen Reactions  . Escitalopram Hives  . Levsin [Hyoscyamine Sulfate] Other (See  Comments)    Reaction:  Patient took medication and felt really spacey     ROS  Constitutional: Negative for fever or weight change.  Respiratory: Negative for cough and shortness of breath.   Cardiovascular: Negative for chest pain or palpitations.  Gastrointestinal: Negative for abdominal pain, no bowel changes.  Musculoskeletal: Negative for gait problem or joint swelling.  Skin: Negative for rash.  Neurological: Negative for dizziness or headache.  No other specific complaints in a complete review of systems (except as listed in HPI above).  Objective  Vitals:   08/23/16 1157  BP: 108/62  Pulse: (!) 102  Resp: 16  Temp: 97.9 F (36.6 C)  TempSrc: Oral  SpO2: 95%  Weight: 142 lb 6.4 oz (64.6 kg)  Height: 5\' 6"  (1.676 m)    Body mass index is 22.98 kg/m.  Physical Exam  Constitutional: Patient appears well-developed and well-nourished.  No distress.  HEENT: head atraumatic, normocephalic, pupils equal and reactive to light,  neck supple, throat within normal limits Cardiovascular: Normal rate, regular rhythm and normal heart sounds.  No murmur heard. No BLE edema. Pulmonary/Chest: Effort normal and breath sounds normal. No respiratory distress. Abdominal: Soft.  There is no tenderness. Psychiatric: Patient has a normal mood and affect. Today no rumination   Recent Results (from the past 2160 hour(s))  CBC with Differential/Platelet     Status: None   Collection Time: 05/30/16 12:48 PM  Result Value Ref Range   WBC 8.8 3.6 - 11.0 K/uL   RBC 4.01 3.80 - 5.20 MIL/uL   Hemoglobin 12.3 12.0 - 16.0 g/dL   HCT 36.2 35.0 - 47.0 %   MCV 90.1 80.0 - 100.0 fL   MCH 30.6 26.0 - 34.0 pg   MCHC 33.9 32.0 - 36.0 g/dL   RDW 13.3 11.5 - 14.5 %   Platelets 238 150 - 440 K/uL   Neutrophils Relative % 64 %   Neutro Abs 5.7 1.4 - 6.5 K/uL   Lymphocytes Relative 25 %   Lymphs Abs 2.2 1.0 - 3.6 K/uL   Monocytes Relative 6 %   Monocytes Absolute 0.5 0.2 - 0.9 K/uL    Eosinophils Relative 4 %   Eosinophils Absolute 0.3 0 - 0.7 K/uL   Basophils Relative 1 %   Basophils Absolute 0.1 0 - 0.1 K/uL  Lithium level     Status: None   Collection Time: 05/30/16 12:48 PM  Result Value Ref Range   Lithium Lvl 0.60 0.60 - 1.20 mmol/L  CBC with Differential/Platelet     Status: None   Collection Time: 06/26/16 11:41 AM  Result Value Ref Range  WBC 8.2 3.6 - 11.0 K/uL   RBC 4.02 3.80 - 5.20 MIL/uL   Hemoglobin 12.6 12.0 - 16.0 g/dL   HCT 36.8 35.0 - 47.0 %   MCV 91.7 80.0 - 100.0 fL   MCH 31.3 26.0 - 34.0 pg   MCHC 34.2 32.0 - 36.0 g/dL   RDW 13.2 11.5 - 14.5 %   Platelets 252 150 - 440 K/uL   Neutrophils Relative % 66 %   Neutro Abs 5.5 1.4 - 6.5 K/uL   Lymphocytes Relative 24 %   Lymphs Abs 2.0 1.0 - 3.6 K/uL   Monocytes Relative 6 %   Monocytes Absolute 0.5 0.2 - 0.9 K/uL   Eosinophils Relative 3 %   Eosinophils Absolute 0.2 0 - 0.7 K/uL   Basophils Relative 1 %   Basophils Absolute 0.1 0 - 0.1 K/uL  Lithium level     Status: None   Collection Time: 06/26/16 11:41 AM  Result Value Ref Range   Lithium Lvl 0.60 0.60 - 1.20 mmol/L  CBC with Differential/Platelet     Status: None   Collection Time: 07/25/16  4:55 PM  Result Value Ref Range   WBC 8.0 3.6 - 11.0 K/uL   RBC 3.96 3.80 - 5.20 MIL/uL   Hemoglobin 12.5 12.0 - 16.0 g/dL   HCT 36.1 35.0 - 47.0 %   MCV 91.3 80.0 - 100.0 fL   MCH 31.6 26.0 - 34.0 pg   MCHC 34.6 32.0 - 36.0 g/dL   RDW 12.7 11.5 - 14.5 %   Platelets 240 150 - 440 K/uL   Neutrophils Relative % 58 %   Neutro Abs 4.6 1.4 - 6.5 K/uL   Lymphocytes Relative 31 %   Lymphs Abs 2.5 1.0 - 3.6 K/uL   Monocytes Relative 6 %   Monocytes Absolute 0.5 0.2 - 0.9 K/uL   Eosinophils Relative 4 %   Eosinophils Absolute 0.3 0 - 0.7 K/uL   Basophils Relative 1 %   Basophils Absolute 0.1 0 - 0.1 K/uL  Lithium level     Status: None   Collection Time: 07/25/16  4:55 PM  Result Value Ref Range   Lithium Lvl 0.60 0.60 - 1.20 mmol/L      PHQ2/9: Depression screen [facility] 2/9 05/22/2016 02/20/2016 01/23/2016 12/21/2015 12/08/2015  Decreased Interest 0 0 0 1 0  Down, Depressed, Hopeless 1 0 2 3 0  PHQ - 2 Score 1 0 2 4 0  Altered sleeping - - 2 3 -  Tired, decreased energy - - 2 3 -  Change in appetite - - 0 2 -  Feeling bad or failure about yourself  - - 1 1 -  Trouble concentrating - - 0 3 -  Moving slowly or fidgety/restless - - 0 2 -  Suicidal thoughts - - 0 0 -  PHQ-9 Score - - 7 18 -  Difficult doing work/chores - - - Very difficult -     Fall Risk: Fall Risk  08/23/2016 05/22/2016 02/20/2016 01/23/2016 12/21/2015  Falls in the past year? No No Yes No No  Number falls in past yr: - - 2 or more - -  Injury with Fall? - - No - -     Functional Status Survey: Is the patient deaf or have difficulty hearing?: No Does the patient have difficulty seeing, even when wearing glasses/contacts?: No Does the patient have difficulty concentrating, remembering, or making decisions?: No Does the patient have difficulty walking or climbing stairs?: No Does the  patient have difficulty dressing or bathing?: No Does the patient have difficulty doing errands alone such as visiting a doctor's office or shopping?: Yes (Patient does not drive her self)    Assessment & Plan  1. Chronic constipation  - linaclotide (LINZESS) 145 MCG CAPS capsule; Take 1 capsule (145 mcg total) by mouth daily.  Dispense: 90 capsule; Refill: 1  2. Schizoaffective disorder, bipolar type ([place])   3. IBS (irritable bowel syndrome)  Doing better with Linzess  4. Tachycardia  - metoprolol tartrate (LOPRESSOR) 25 MG tablet; Take 0.5 tablets (12.5 mg total) by mouth 2 (two) times daily.  Dispense: 90 tablet; Refill: 1

## 2016-09-26 ENCOUNTER — Other Ambulatory Visit
Admission: RE | Admit: 2016-09-26 | Discharge: 2016-09-26 | Disposition: A | Discharge status: Home / Self Care | Payer: Managed Care, Other (non HMO) | Source: Ambulatory Visit | Attending: Psychiatry | Admitting: Psychiatry

## 2016-09-26 DIAGNOSIS — F259 Schizoaffective disorder, unspecified: Secondary | ICD-10-CM | POA: Diagnosis present

## 2016-09-26 DIAGNOSIS — Z79899 Other long term (current) drug therapy: Secondary | ICD-10-CM | POA: Diagnosis present

## 2016-09-26 LAB — CBC WITH DIFFERENTIAL/PLATELET
BASOS ABS: 0.1 10*3/uL (ref 0–0.1)
BASOS PCT: 1 %
EOS PCT: 3 %
Eosinophils Absolute: 0.2 10*3/uL (ref 0–0.7)
HEMATOCRIT: 37.2 % (ref 35.0–47.0)
Hemoglobin: 12.6 g/dL (ref 12.0–16.0)
Lymphocytes Relative: 22 %
Lymphs Abs: 1.8 10*3/uL (ref 1.0–3.6)
MCH: 31 pg (ref 26.0–34.0)
MCHC: 33.9 g/dL (ref 32.0–36.0)
MCV: 91.5 fL (ref 80.0–100.0)
MONO ABS: 0.5 10*3/uL (ref 0.2–0.9)
Monocytes Relative: 7 %
NEUTROS ABS: 5.5 10*3/uL (ref 1.4–6.5)
Neutrophils Relative %: 67 %
PLATELETS: 274 10*3/uL (ref 150–440)
RBC: 4.06 MIL/uL (ref 3.80–5.20)
RDW: 12.9 % (ref 11.5–14.5)
WBC: 8.2 10*3/uL (ref 3.6–11.0)

## 2016-09-26 LAB — LITHIUM LEVEL: Lithium Lvl: 0.69 mmol/L (ref 0.60–1.20)

## 2016-10-07 ENCOUNTER — Other Ambulatory Visit: Payer: Self-pay | Admitting: Family Medicine

## 2016-10-07 NOTE — Telephone Encounter (Signed)
Patient requesting refill of Vitamin D to [facility].  

## 2016-10-17 ENCOUNTER — Other Ambulatory Visit: Payer: Self-pay | Admitting: Family Medicine

## 2016-10-17 DIAGNOSIS — K5909 Other constipation: Secondary | ICD-10-CM

## 2016-10-17 NOTE — Telephone Encounter (Signed)
Patient requesting refill of Linzess to [facility].

## 2016-10-18 ENCOUNTER — Other Ambulatory Visit
Admission: RE | Admit: 2016-10-18 | Discharge: 2016-10-18 | Disposition: A | Discharge status: Home / Self Care | Payer: Managed Care, Other (non HMO) | Source: Ambulatory Visit | Attending: Psychiatry | Admitting: Psychiatry

## 2016-10-18 DIAGNOSIS — Z79899 Other long term (current) drug therapy: Secondary | ICD-10-CM | POA: Insufficient documentation

## 2016-10-18 DIAGNOSIS — F250 Schizoaffective disorder, bipolar type: Secondary | ICD-10-CM | POA: Insufficient documentation

## 2016-10-18 LAB — CBC WITH DIFFERENTIAL/PLATELET
Basophils Absolute: 0.1 10*3/uL (ref 0–0.1)
Basophils Relative: 1 %
Eosinophils Absolute: 0.3 10*3/uL (ref 0–0.7)
Eosinophils Relative: 3 %
HCT: 38 % (ref 35.0–47.0)
Hemoglobin: 12.8 g/dL (ref 12.0–16.0)
Lymphocytes Relative: 21 %
Lymphs Abs: 1.6 10*3/uL (ref 1.0–3.6)
MCH: 31.8 pg (ref 26.0–34.0)
MCHC: 33.8 g/dL (ref 32.0–36.0)
MCV: 94.2 fL (ref 80.0–100.0)
Monocytes Absolute: 0.4 10*3/uL (ref 0.2–0.9)
Monocytes Relative: 6 %
Neutro Abs: 5.4 10*3/uL (ref 1.4–6.5)
Neutrophils Relative %: 69 %
Platelets: 295 10*3/uL (ref 150–440)
RBC: 4.03 MIL/uL (ref 3.80–5.20)
RDW: 13.1 % (ref 11.5–14.5)
WBC: 7.8 10*3/uL (ref 3.6–11.0)

## 2016-10-18 LAB — LITHIUM LEVEL: Lithium Lvl: 0.71 mmol/L (ref 0.60–1.20)

## 2016-11-18 ENCOUNTER — Other Ambulatory Visit
Admission: RE | Admit: 2016-11-18 | Discharge: 2016-11-18 | Disposition: A | Discharge status: Home / Self Care | Payer: Managed Care, Other (non HMO) | Source: Ambulatory Visit | Attending: Psychiatry | Admitting: Psychiatry

## 2016-11-18 DIAGNOSIS — Z79899 Other long term (current) drug therapy: Secondary | ICD-10-CM | POA: Insufficient documentation

## 2016-11-18 DIAGNOSIS — F259 Schizoaffective disorder, unspecified: Secondary | ICD-10-CM | POA: Insufficient documentation

## 2016-11-18 LAB — CBC WITH DIFFERENTIAL/PLATELET
Basophils Absolute: 0.1 10*3/uL (ref 0–0.1)
Basophils Relative: 1 %
EOS ABS: 0.3 10*3/uL (ref 0–0.7)
Eosinophils Relative: 4 %
HEMATOCRIT: 37.7 % (ref 35.0–47.0)
HEMOGLOBIN: 12.5 g/dL (ref 12.0–16.0)
LYMPHS ABS: 1.5 10*3/uL (ref 1.0–3.6)
LYMPHS PCT: 19 %
MCH: 30.5 pg (ref 26.0–34.0)
MCHC: 33.2 g/dL (ref 32.0–36.0)
MCV: 91.8 fL (ref 80.0–100.0)
MONOS PCT: 5 %
Monocytes Absolute: 0.4 10*3/uL (ref 0.2–0.9)
NEUTROS ABS: 5.8 10*3/uL (ref 1.4–6.5)
NEUTROS PCT: 71 %
Platelets: 282 10*3/uL (ref 150–440)
RBC: 4.11 MIL/uL (ref 3.80–5.20)
RDW: 12.8 % (ref 11.5–14.5)
WBC: 8.1 10*3/uL (ref 3.6–11.0)

## 2016-11-18 LAB — LITHIUM LEVEL: LITHIUM LVL: 0.69 mmol/L (ref 0.60–1.20)

## 2016-11-19 IMAGING — CT CT HEAD W/O CM
2 of 3 series · 17 of 30 positions shown, 20 images · non-contrast
Comparison: None.

CLINICAL DATA: 54-year-old female with head injury

EXAM:
CT HEAD WITHOUT CONTRAST
TECHNIQUE: Contiguous axial images were obtained from the base of the skull
through the vertex without intravenous contrast.

[Series 2: head wo · axial · 0.39mm/px · z∈[-128,-12]mm · 11 of 29 slices shown, 14 images]
[im 3/29  brain]
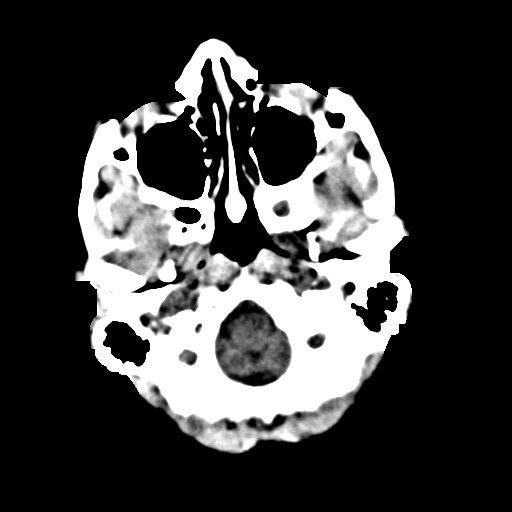
[im 3/29  bone]
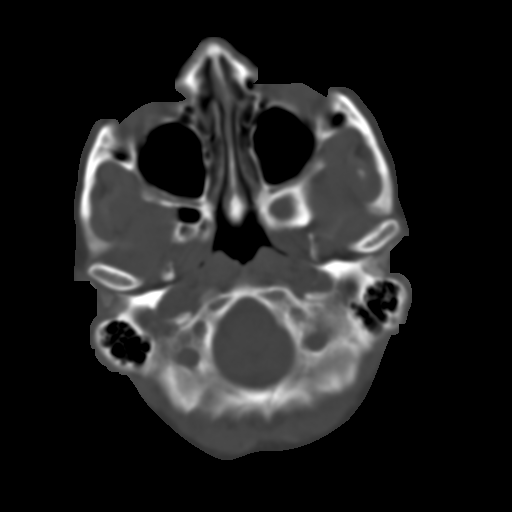
[im 5/29  brain]
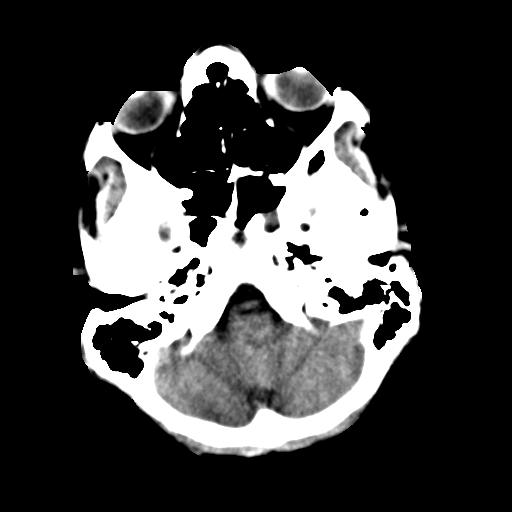
[im 8/29  brain]
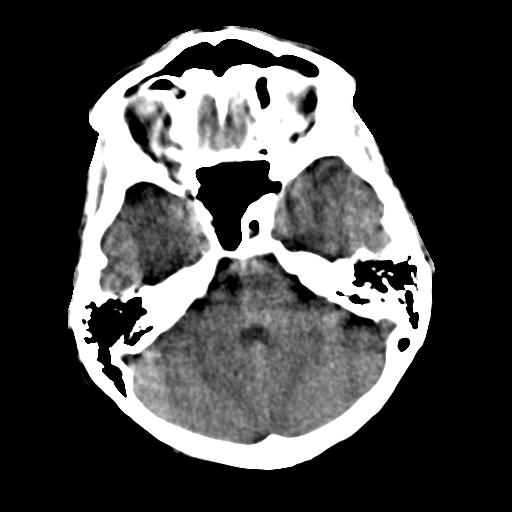
[im 10/29  brain]
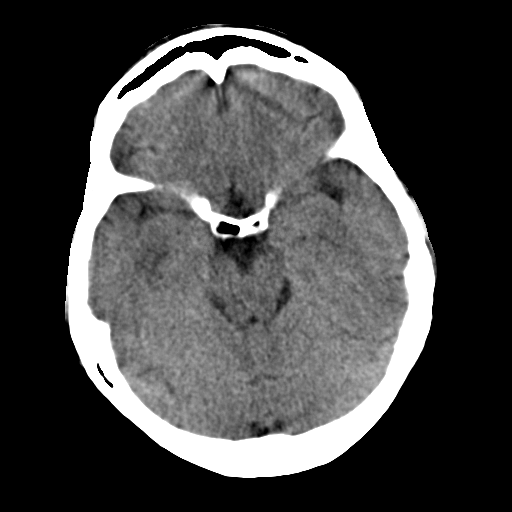
[im 12/29  brain]
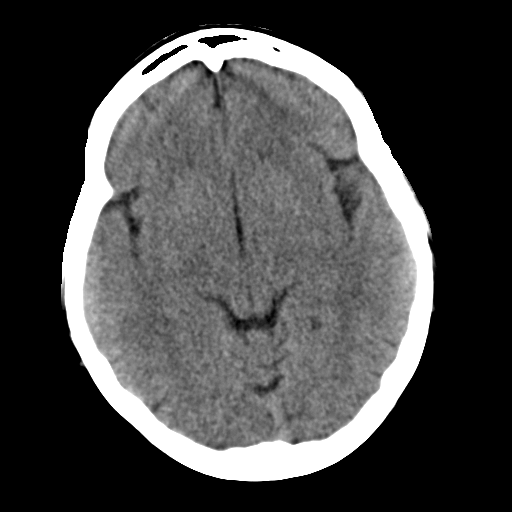
[im 12/29  bone]
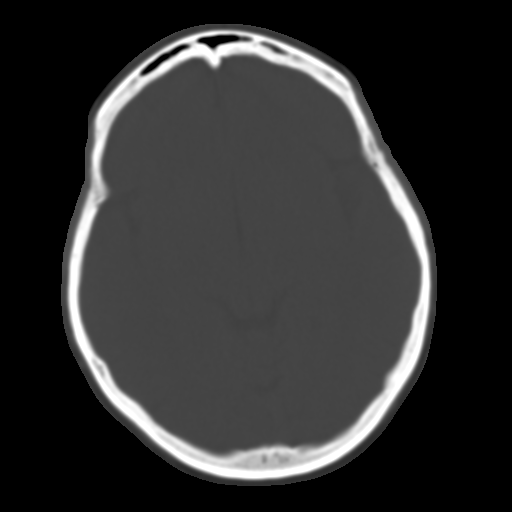
[im 15/29  brain]
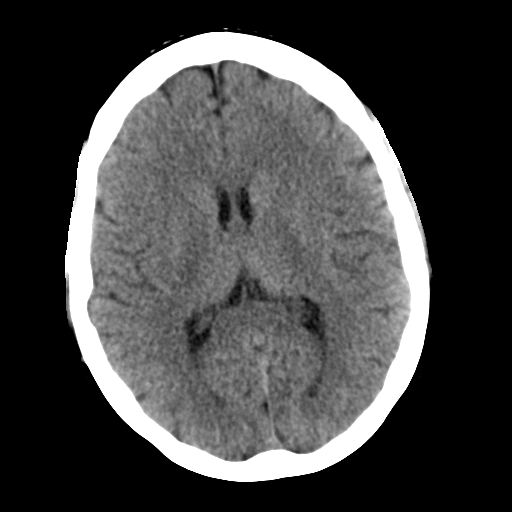
[im 17/29  brain]
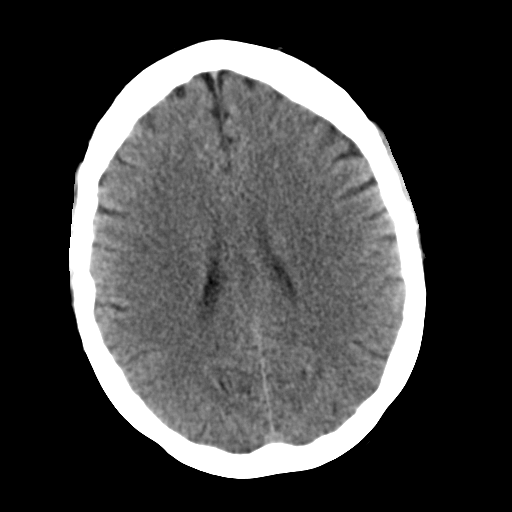
[im 19/29  brain]
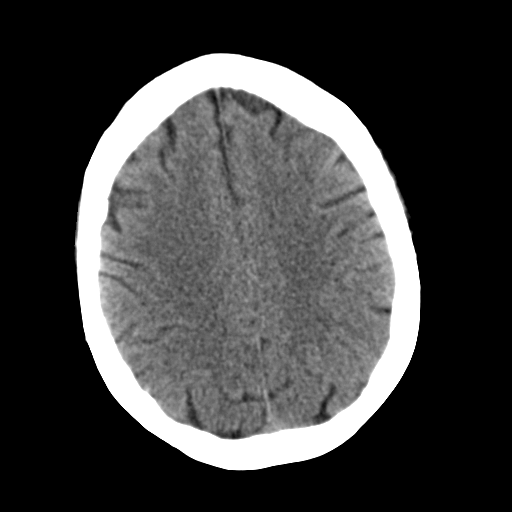
[im 22/29  brain]
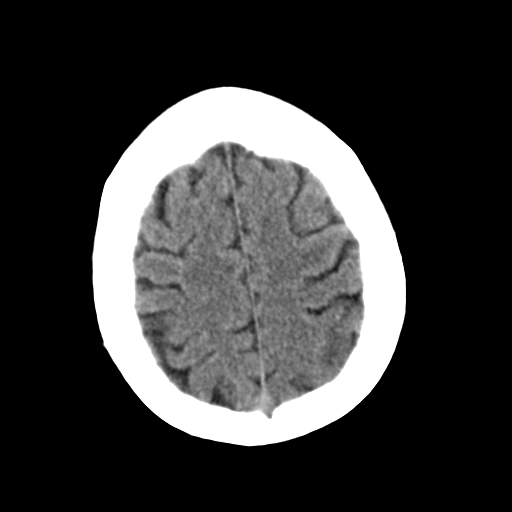
[im 22/29  bone]
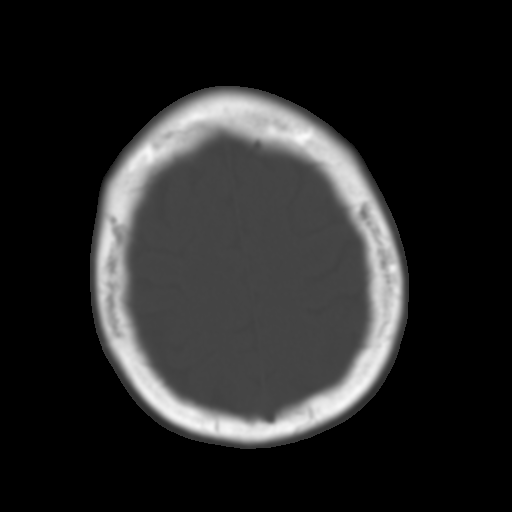
[im 24/29  brain]
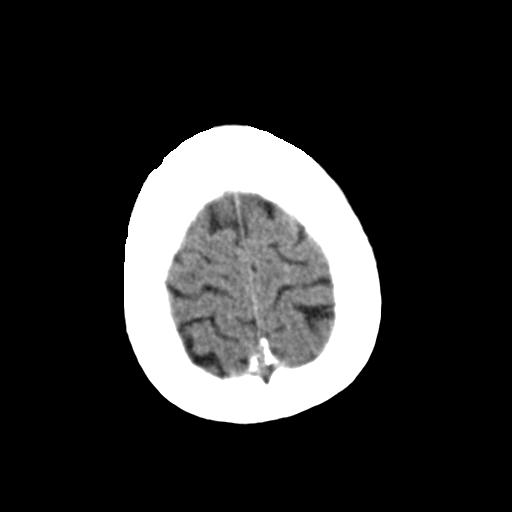
[im 26/29  brain]
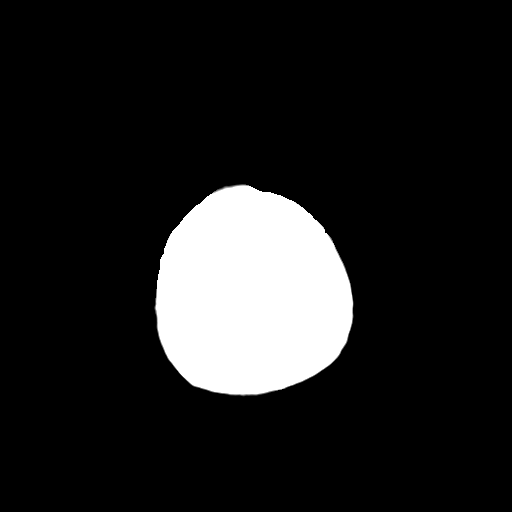

[Series 6: head wo recons · axial · 0.39mm/px · z∈[-89,-7]mm · 6 of 29 slices shown]
[im 3/29  brain]
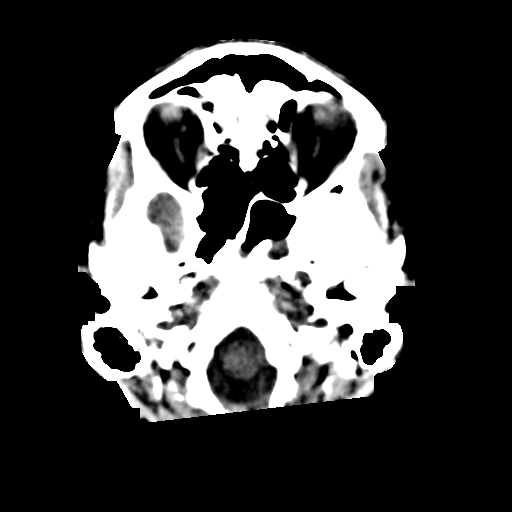
[im 7/29  brain]
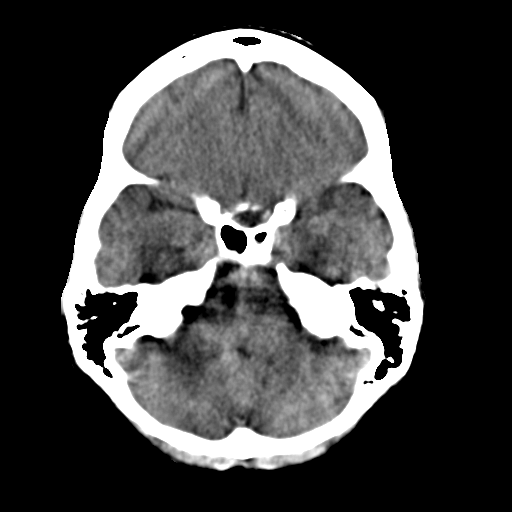
[im 9/29  brain]
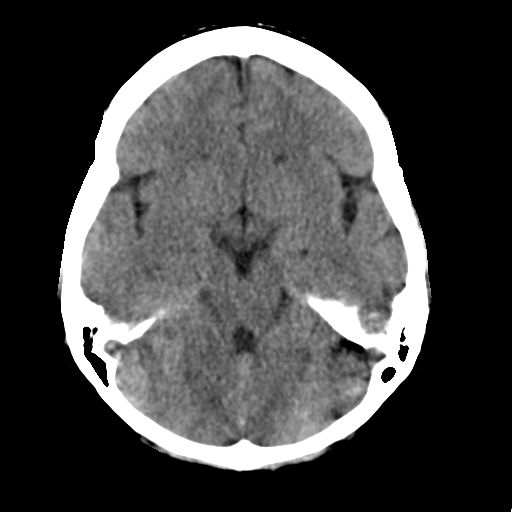
[im 13/29  brain]
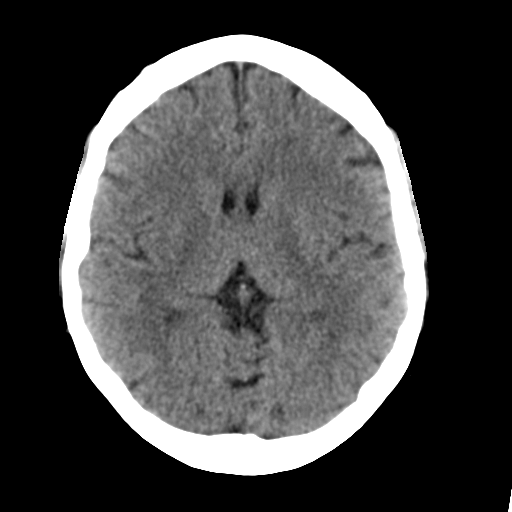
[im 16/29  brain]
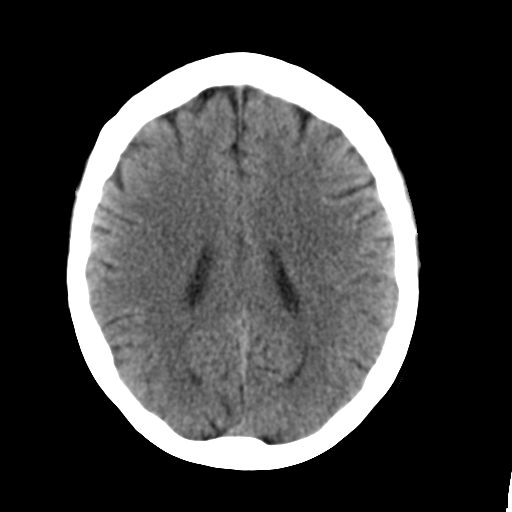
[im 20/29  brain]
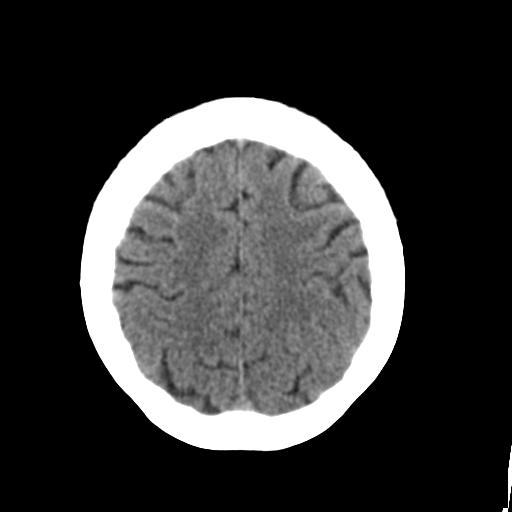

[17 of 30 positions shown; findings below may reference images not displayed]

FINDINGS: The ventricles and sulci are appropriate in size for the patient's
age. There is no intracranial hemorrhage. No mass effect or midline
shift identified. The gray-white matter differentiation is
preserved. There is no extra-axial fluid collection.

There is partial opacification of the left sphenoid sinus. The
remainder of the visualized paranasal sinuses and mastoid air cells
are well aerated. The calvarium is intact.
IMPRESSION: No acute intracranial pathology.

## 2016-12-25 ENCOUNTER — Other Ambulatory Visit
Admission: RE | Admit: 2016-12-25 | Discharge: 2016-12-25 | Disposition: A | Discharge status: Home / Self Care | Payer: Managed Care, Other (non HMO) | Source: Ambulatory Visit | Attending: Psychiatry | Admitting: Psychiatry

## 2016-12-25 DIAGNOSIS — F250 Schizoaffective disorder, bipolar type: Secondary | ICD-10-CM | POA: Diagnosis not present

## 2016-12-25 DIAGNOSIS — Z79899 Other long term (current) drug therapy: Secondary | ICD-10-CM | POA: Insufficient documentation

## 2016-12-25 LAB — CBC WITH DIFFERENTIAL/PLATELET
BASOS ABS: 0.1 10*3/uL (ref 0–0.1)
Basophils Relative: 1 %
EOS PCT: 3 %
Eosinophils Absolute: 0.2 10*3/uL (ref 0–0.7)
HCT: 38.2 % (ref 35.0–47.0)
HEMOGLOBIN: 12.7 g/dL (ref 12.0–16.0)
LYMPHS PCT: 22 %
Lymphs Abs: 1.7 10*3/uL (ref 1.0–3.6)
MCH: 30.5 pg (ref 26.0–34.0)
MCHC: 33.1 g/dL (ref 32.0–36.0)
MCV: 92 fL (ref 80.0–100.0)
Monocytes Absolute: 0.4 10*3/uL (ref 0.2–0.9)
Monocytes Relative: 5 %
NEUTROS ABS: 5.3 10*3/uL (ref 1.4–6.5)
NEUTROS PCT: 69 %
PLATELETS: 255 10*3/uL (ref 150–440)
RBC: 4.16 MIL/uL (ref 3.80–5.20)
RDW: 12.7 % (ref 11.5–14.5)
WBC: 7.6 10*3/uL (ref 3.6–11.0)

## 2016-12-25 LAB — LITHIUM LEVEL: LITHIUM LVL: 0.6 mmol/L (ref 0.60–1.20)

## 2017-01-05 ENCOUNTER — Other Ambulatory Visit: Payer: Self-pay | Admitting: Family Medicine

## 2017-01-06 NOTE — Telephone Encounter (Signed)
Patient requesting refill of Vitamin D to [facility].  

## 2017-01-23 ENCOUNTER — Other Ambulatory Visit
Admission: RE | Admit: 2017-01-23 | Discharge: 2017-01-23 | Disposition: A | Discharge status: Home / Self Care | Payer: Managed Care, Other (non HMO) | Source: Ambulatory Visit | Attending: Psychiatry | Admitting: Psychiatry

## 2017-01-23 DIAGNOSIS — F259 Schizoaffective disorder, unspecified: Secondary | ICD-10-CM | POA: Insufficient documentation

## 2017-01-23 DIAGNOSIS — Z79899 Other long term (current) drug therapy: Secondary | ICD-10-CM | POA: Diagnosis not present

## 2017-01-23 LAB — CBC WITH DIFFERENTIAL/PLATELET
BASOS ABS: 0 10*3/uL (ref 0–0.1)
Basophils Relative: 1 %
Eosinophils Absolute: 0.3 10*3/uL (ref 0–0.7)
Eosinophils Relative: 4 %
HEMATOCRIT: 38.9 % (ref 35.0–47.0)
Hemoglobin: 12.8 g/dL (ref 12.0–16.0)
LYMPHS PCT: 21 %
Lymphs Abs: 1.5 10*3/uL (ref 1.0–3.6)
MCH: 30.2 pg (ref 26.0–34.0)
MCHC: 33 g/dL (ref 32.0–36.0)
MCV: 91.6 fL (ref 80.0–100.0)
Monocytes Absolute: 0.4 10*3/uL (ref 0.2–0.9)
Monocytes Relative: 5 %
Neutro Abs: 5.1 10*3/uL (ref 1.4–6.5)
Neutrophils Relative %: 69 %
Platelets: 248 10*3/uL (ref 150–440)
RBC: 4.24 MIL/uL (ref 3.80–5.20)
RDW: 13 % (ref 11.5–14.5)
WBC: 7.4 10*3/uL (ref 3.6–11.0)

## 2017-01-23 LAB — LITHIUM LEVEL: Lithium Lvl: 0.67 mmol/L (ref 0.60–1.20)

## 2017-02-21 ENCOUNTER — Other Ambulatory Visit
Admission: RE | Admit: 2017-02-21 | Discharge: 2017-02-21 | Disposition: A | Discharge status: Home / Self Care | Payer: Managed Care, Other (non HMO) | Source: Ambulatory Visit | Attending: Psychiatry | Admitting: Psychiatry

## 2017-02-21 ENCOUNTER — Ambulatory Visit (INDEPENDENT_AMBULATORY_CARE_PROVIDER_SITE_OTHER): Payer: Managed Care, Other (non HMO) | Admitting: Family Medicine

## 2017-02-21 ENCOUNTER — Encounter: Payer: Self-pay | Admitting: Family Medicine

## 2017-02-21 VITALS — BP 118/72 | HR 98 | Temp 98.0°F | Resp 16 | Ht 66.0 in | Wt 150.1 lb

## 2017-02-21 DIAGNOSIS — Z79899 Other long term (current) drug therapy: Secondary | ICD-10-CM | POA: Diagnosis not present

## 2017-02-21 DIAGNOSIS — E781 Pure hyperglyceridemia: Secondary | ICD-10-CM | POA: Diagnosis not present

## 2017-02-21 DIAGNOSIS — K219 Gastro-esophageal reflux disease without esophagitis: Secondary | ICD-10-CM | POA: Insufficient documentation

## 2017-02-21 DIAGNOSIS — K5909 Other constipation: Secondary | ICD-10-CM

## 2017-02-21 DIAGNOSIS — F250 Schizoaffective disorder, bipolar type: Secondary | ICD-10-CM

## 2017-02-21 DIAGNOSIS — F259 Schizoaffective disorder, unspecified: Secondary | ICD-10-CM | POA: Diagnosis present

## 2017-02-21 DIAGNOSIS — K581 Irritable bowel syndrome with constipation: Secondary | ICD-10-CM

## 2017-02-21 DIAGNOSIS — R000 Tachycardia, unspecified: Secondary | ICD-10-CM

## 2017-02-21 LAB — CBC WITH DIFFERENTIAL/PLATELET
Basophils Absolute: 0 10*3/uL (ref 0–0.1)
Basophils Relative: 1 %
EOS PCT: 5 %
Eosinophils Absolute: 0.3 10*3/uL (ref 0–0.7)
HEMATOCRIT: 39.3 % (ref 35.0–47.0)
Hemoglobin: 12.9 g/dL (ref 12.0–16.0)
LYMPHS ABS: 1.6 10*3/uL (ref 1.0–3.6)
LYMPHS PCT: 23 %
MCH: 30 pg (ref 26.0–34.0)
MCHC: 32.9 g/dL (ref 32.0–36.0)
MCV: 91.4 fL (ref 80.0–100.0)
MONO ABS: 0.3 10*3/uL (ref 0.2–0.9)
Monocytes Relative: 5 %
NEUTROS ABS: 4.6 10*3/uL (ref 1.4–6.5)
Neutrophils Relative %: 66 %
PLATELETS: 266 10*3/uL (ref 150–440)
RBC: 4.3 MIL/uL (ref 3.80–5.20)
RDW: 13.7 % (ref 11.5–14.5)
WBC: 6.8 10*3/uL (ref 3.6–11.0)

## 2017-02-21 LAB — LITHIUM LEVEL: Lithium Lvl: 0.67 mmol/L (ref 0.60–1.20)

## 2017-02-21 MED ORDER — METOPROLOL TARTRATE 25 MG PO TABS: 12.5000 mg | ORAL_TABLET | Freq: Two times a day (BID) | ORAL | 1 refills | Status: DC

## 2017-02-21 NOTE — Patient Instructions (Signed)
Food Choices for Gastroesophageal Reflux Disease, Adult When you have gastroesophageal reflux disease (GERD), the foods you eat and your eating habits are very important. Choosing the right foods can help ease your discomfort. What guidelines do I need to follow?  Choose fruits, vegetables, whole grains, and low-fat dairy products.  Choose low-fat meat, fish, and poultry.  Limit fats such as oils, salad dressings, butter, nuts, and avocado.  Keep a food diary. This helps you identify foods that cause symptoms.  Avoid foods that cause symptoms. These may be different for everyone.  Eat small meals often instead of 3 large meals a day.  Eat your meals slowly, in a place where you are relaxed.  Limit fried foods.  Cook foods using methods other than frying.  Avoid drinking alcohol.  Avoid drinking large amounts of liquids with your meals.  Avoid bending over or lying down until 2-3 hours after eating. What foods are not recommended? These are some foods and drinks that may make your symptoms worse: Vegetables  Tomatoes. Tomato juice. Tomato and spaghetti sauce. Chili peppers. Onion and garlic. Horseradish. Fruits  Oranges, grapefruit, and lemon (fruit and juice). Meats  High-fat meats, fish, and poultry. This includes hot dogs, ribs, ham, sausage, salami, and bacon. Dairy  Whole milk and chocolate milk. Sour cream. Cream. Butter. Ice cream. Cream cheese. Drinks  Coffee and tea. Bubbly (carbonated) drinks or energy drinks. Condiments  Hot sauce. Barbecue sauce. Sweets/Desserts  Chocolate and cocoa. Donuts. Peppermint and spearmint. Fats and Oils  High-fat foods. This includes French fries and potato chips. Other  Vinegar. Strong spices. This includes black pepper, white pepper, red pepper, cayenne, curry powder, cloves, ginger, and chili powder. The items listed above may not be a complete list of foods and drinks to avoid. Contact your dietitian for more information.    This information is not intended to replace advice given to you by your health care provider. Make sure you discuss any questions you have with your health care provider. Document Released: 05/19/2012 Document Revised: 04/25/2016 Document Reviewed: 09/22/2013 Elsevier Interactive Patient Education  2017 [employer]  

## 2017-02-21 NOTE — Progress Notes (Signed)
Name: Carla Thompson   MRN: [id number]    DOB: 24-Aug-1961   Date:02/21/2017       Progress Note  Subjective  Chief Complaint  Chief Complaint  Patient presents with  . chronic constipation  . Irritable Bowel Syndrome  . Insomnia    HPI  Chronic constipation: she is now on Linzess, no longer soiling, bowel movements are better about once a day, occasionally she skips days, and has some pain intermittently, she has IBS also, she is coping better, she drinks more water when needed and is able to have a bowel movement  Schizophrenia: Seeing psychiatrist . No longer has hallucinations.She has been cooking and cleaning again. Able to have friends over. Husband is here with her and thinks she is back to baseline  GERD: a few months ago she complained to her psychiatrist about burning sensation on the back of her throat. He advised otc Prilosec and she took for about a week and symptoms improved, she is now taking Pepcid and symptoms are under control. Discussed life style modification, try to wean self off medication to take prn only   Tachycardia: doing well on Lopressor half pill twice daily, she denies SOB or chest pain   Hypertriglyceridemia: last labs were normal, she is following a low fat diet and we will recheck labs  Patient Active Problem List   Diagnosis Date Noted  . GERD without esophagitis 02/21/2017  . Tachycardia 02/20/2016  . RLS (restless legs syndrome) 12/21/2015  . IBS (irritable bowel syndrome) 12/21/2015  . Other long term (current) drug therapy 12/08/2015  . Chronic constipation 12/08/2015  . Insomnia, persistent 12/08/2015  . Dermatitis, eczematoid 12/08/2015  . Gravida 2 para 2 12/08/2015  . Hypertriglyceridemia 12/08/2015  . Metallic taste 84/69/6295  . 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 Schizoaffective disorder, bipolar type ([place]) 12/08/2015    Past Surgical History:  Procedure Laterality Date  . ANUS SURGERY  2000   [place]  . TUBAL  LIGATION  12/02/1994    Family History  Problem Relation Age of Onset  . Mental illness Sister     Schizophrenia and bipolar  . Cancer Maternal Grandmother     Colon  . Mental illness Paternal Grandmother     Schizophrenia  . Breast cancer Neg Hx     Social History   Social History  . Marital status: Married    Spouse name: N/A  . Number of children: N/A  . Years of education: N/A   Occupational History  . Not on file.   Social History Main Topics  . Smoking status: Never Smoker  . Smokeless tobacco: Never Used  . Alcohol use No  . Drug use: No  . Sexual activity: Not Currently   Other Topics Concern  . Not on file   Social History Narrative  . No narrative on file     Current Outpatient Prescriptions:  .  CINNAMON PO, Take by mouth., Disp: , Rfl:  .  famotidine (PEPCID) 20 MG tablet, Take 20 mg by mouth 2 (two) times daily., Disp: , Rfl:  .  B Complex-C (SUPER B COMPLEX PO), Take 1 tablet by mouth daily., Disp: , Rfl:  .  clonazePAM (KLONOPIN) 0.5 MG tablet, Take 0.5 mg by mouth at bedtime., Disp: , Rfl:  .  cloZAPine (CLOZARIL) 100 MG tablet, Take 100-300 mg by mouth 2 (two) times daily. Pt takes one tablet in the morning and three tablets at bedtime. , Disp: , Rfl:  .  docusate  sodium (STOOL SOFTENER) 100 MG capsule, Take 100 mg by mouth 2 (two) times daily as needed for mild constipation. , Disp: , Rfl:  .  LINZESS 145 MCG CAPS capsule, TAKE 1 CAPSULE(145 MCG) BY MOUTH DAILY, Disp: 30 capsule, Rfl: 0 .  lithium carbonate 300 MG capsule, TK 2 CS PO QPM, Disp: , Rfl: 0 .  metoprolol tartrate (LOPRESSOR) 25 MG tablet, Take 0.5 tablets (12.5 mg total) by mouth 2 (two) times daily., Disp: 90 tablet, Rfl: 1 .  vitamin C (ASCORBIC ACID) 500 MG tablet, Take 500 mg by mouth daily., Disp: , Rfl:  .  Vitamin D, Ergocalciferol, (DRISDOL) 50000 units CAPS capsule, TAKE 1 CAPSULE BY MOUTH EVERY 7 DAYS, Disp: 12 capsule, Rfl: 0 .  vitamin E 1000 UNIT capsule, Take 1,000 Units  by mouth daily. , Disp: , Rfl:   Allergies  Allergen Reactions  . Escitalopram Hives  . Levsin [Hyoscyamine Sulfate] Other (See Comments)    Reaction:  Patient took medication and felt really spacey     ROS  Constitutional: Negative for fever, positive for  weight change.  Respiratory: Negative for cough and shortness of breath.   Cardiovascular: Negative for chest pain or palpitations.  Gastrointestinal: Negative for abdominal pain, no bowel changes.  Musculoskeletal: Negative for gait problem or joint swelling.  Skin: Negative for rash.  Neurological: Negative for dizziness or headache.  No other specific complaints in a complete review of systems (except as listed in HPI above).  Objective  Vitals:   02/21/17 1142  BP: 118/72  Pulse: 98  Resp: 16  Temp: 98 F (36.7 C)  SpO2: 99%  Weight: 150 lb 2 oz (68.1 kg)  Height: 5\' 6"  (1.676 m)    Body mass index is 24.23 kg/m.  Physical Exam  Constitutional: Patient appears well-developed and well-nourished.No distress.  HEENT: head atraumatic, normocephalic, pupils equal and reactive to light, neck supple, throat within normal limits Cardiovascular: Normal rate, regular rhythm and normal heart sounds.  No murmur heard. No BLE edema. Pulmonary/Chest: Effort normal and breath sounds normal. No respiratory distress. Abdominal: Soft.  There is no tenderness. Psychiatric: Patient has a normal mood and affect. behavior is normal. She came in with her husband   Recent Results (from the past 2160 hour(s))  Lithium level     Status: None   Collection Time: 12/25/16 11:26 AM  Result Value Ref Range   Lithium Lvl 0.60 0.60 - 1.20 mmol/L  CBC with Differential/Platelet     Status: None   Collection Time: 12/25/16 11:26 AM  Result Value Ref Range   WBC 7.6 3.6 - 11.0 K/uL   RBC 4.16 3.80 - 5.20 MIL/uL   Hemoglobin 12.7 12.0 - 16.0 g/dL   HCT 38.2 35.0 - 47.0 %   MCV 92.0 80.0 - 100.0 fL   MCH 30.5 26.0 - 34.0 pg   MCHC 33.1  32.0 - 36.0 g/dL   RDW 12.7 11.5 - 14.5 %   Platelets 255 150 - 440 K/uL   Neutrophils Relative % 69 %   Neutro Abs 5.3 1.4 - 6.5 K/uL   Lymphocytes Relative 22 %   Lymphs Abs 1.7 1.0 - 3.6 K/uL   Monocytes Relative 5 %   Monocytes Absolute 0.4 0.2 - 0.9 K/uL   Eosinophils Relative 3 %   Eosinophils Absolute 0.2 0 - 0.7 K/uL   Basophils Relative 1 %   Basophils Absolute 0.1 0 - 0.1 K/uL  CBC with Differential/Platelet  Status: None   Collection Time: 01/23/17 11:25 AM  Result Value Ref Range   WBC 7.4 3.6 - 11.0 K/uL   RBC 4.24 3.80 - 5.20 MIL/uL   Hemoglobin 12.8 12.0 - 16.0 g/dL   HCT 38.9 35.0 - 47.0 %   MCV 91.6 80.0 - 100.0 fL   MCH 30.2 26.0 - 34.0 pg   MCHC 33.0 32.0 - 36.0 g/dL   RDW 13.0 11.5 - 14.5 %   Platelets 248 150 - 440 K/uL   Neutrophils Relative % 69 %   Neutro Abs 5.1 1.4 - 6.5 K/uL   Lymphocytes Relative 21 %   Lymphs Abs 1.5 1.0 - 3.6 K/uL   Monocytes Relative 5 %   Monocytes Absolute 0.4 0.2 - 0.9 K/uL   Eosinophils Relative 4 %   Eosinophils Absolute 0.3 0 - 0.7 K/uL   Basophils Relative 1 %   Basophils Absolute 0.0 0 - 0.1 K/uL  Lithium level     Status: None   Collection Time: 01/23/17 11:25 AM  Result Value Ref Range   Lithium Lvl 0.67 0.60 - 1.20 mmol/L  CBC with Differential/Platelet     Status: None   Collection Time: 02/21/17 10:56 AM  Result Value Ref Range   WBC 6.8 3.6 - 11.0 K/uL   RBC 4.30 3.80 - 5.20 MIL/uL   Hemoglobin 12.9 12.0 - 16.0 g/dL   HCT 39.3 35.0 - 47.0 %   MCV 91.4 80.0 - 100.0 fL   MCH 30.0 26.0 - 34.0 pg   MCHC 32.9 32.0 - 36.0 g/dL   RDW 13.7 11.5 - 14.5 %   Platelets 266 150 - 440 K/uL   Neutrophils Relative % 66 %   Neutro Abs 4.6 1.4 - 6.5 K/uL   Lymphocytes Relative 23 %   Lymphs Abs 1.6 1.0 - 3.6 K/uL   Monocytes Relative 5 %   Monocytes Absolute 0.3 0.2 - 0.9 K/uL   Eosinophils Relative 5 %   Eosinophils Absolute 0.3 0 - 0.7 K/uL   Basophils Relative 1 %   Basophils Absolute 0.0 0 - 0.1 K/uL      PHQ2/9: Depression screen [facility] 2/9 05/22/2016 02/20/2016 01/23/2016 12/21/2015 12/08/2015  Decreased Interest 0 0 0 1 0  Down, Depressed, Hopeless 1 0 2 3 0  PHQ - 2 Score 1 0 2 4 0  Altered sleeping - - 2 3 -  Tired, decreased energy - - 2 3 -  Change in appetite - - 0 2 -  Feeling bad or failure about yourself  - - 1 1 -  Trouble concentrating - - 0 3 -  Moving slowly or fidgety/restless - - 0 2 -  Suicidal thoughts - - 0 0 -  PHQ-9 Score - - 7 18 -  Difficult doing work/chores - - - Very difficult -     Fall Risk: Fall Risk  08/23/2016 05/22/2016 02/20/2016 01/23/2016 12/21/2015  Falls in the past year? No No Yes No No  Number falls in past yr: - - 2 or more - -  Injury with Fall? - - No - -     Assessment & Plan  1. Chronic constipation  Doing well at this time  2. Schizoaffective disorder, bipolar type ([place])  Doing well at this time, continue follow up with psychiatrist  3. Irritable bowel syndrome with constipation   4. Hypertriglyceridemia  - Lipid panel  5. GERD without esophagitis  Discussed life style modification, try to wean medication off  6. Tachycardia  Doing well at this time - metoprolol tartrate (LOPRESSOR) 25 MG tablet; Take 0.5 tablets (12.5 mg total) by mouth 2 (two) times daily.  Dispense: 90 tablet; Refill: 1  7. Long-term use of high-risk medication  - COMPLETE METABOLIC PANEL WITH GFR

## 2017-02-28 LAB — COMPLETE METABOLIC PANEL WITH GFR
ALBUMIN: 3.8 g/dL (ref 3.6–5.1)
ALK PHOS: 77 U/L (ref 33–130)
ALT: 14 U/L (ref 6–29)
AST: 15 U/L (ref 10–35)
BUN: 9 mg/dL (ref 7–25)
CHLORIDE: 105 mmol/L (ref 98–110)
CO2: 27 mmol/L (ref 20–31)
CREATININE: 1.05 mg/dL (ref 0.50–1.05)
Calcium: 9.6 mg/dL (ref 8.6–10.4)
GFR, Est African American: 70 mL/min (ref >=60)
GFR, Est Non African American: 60 mL/min (ref >=60)
GLUCOSE: 103 mg/dL — AB (ref 65–99)
POTASSIUM: 4.2 mmol/L (ref 3.5–5.3)
SODIUM: 142 mmol/L (ref 135–146)
Total Bilirubin: 0.4 mg/dL (ref 0.2–1.2)
Total Protein: 6.4 g/dL (ref 6.1–8.1)

## 2017-02-28 LAB — LIPID PANEL
CHOL/HDL RATIO: 4.2 ratio (ref <5.0)
CHOLESTEROL: 241 mg/dL — AB (ref <200)
HDL: 57 mg/dL (ref >50)
LDL Cholesterol: 138 mg/dL — ABNORMAL HIGH (ref <100)
Triglycerides: 228 mg/dL — ABNORMAL HIGH (ref <150)
VLDL: 46 mg/dL — AB (ref <30)

## 2017-03-07 ENCOUNTER — Telehealth: Payer: Self-pay | Admitting: Family Medicine

## 2017-03-17 ENCOUNTER — Other Ambulatory Visit: Payer: Self-pay | Admitting: Family Medicine

## 2017-03-17 DIAGNOSIS — R000 Tachycardia, unspecified: Secondary | ICD-10-CM

## 2017-03-24 ENCOUNTER — Other Ambulatory Visit
Admission: RE | Admit: 2017-03-24 | Discharge: 2017-03-24 | Disposition: A | Discharge status: Home / Self Care | Payer: Managed Care, Other (non HMO) | Source: Ambulatory Visit | Attending: Psychiatry | Admitting: Psychiatry

## 2017-03-24 DIAGNOSIS — E781 Pure hyperglyceridemia: Secondary | ICD-10-CM | POA: Diagnosis not present

## 2017-03-24 LAB — CBC WITH DIFFERENTIAL/PLATELET
BASOS ABS: 0 10*3/uL (ref 0–0.1)
BASOS PCT: 1 %
Eosinophils Absolute: 0.2 10*3/uL (ref 0–0.7)
Eosinophils Relative: 3 %
HEMATOCRIT: 38.7 % (ref 35.0–47.0)
HEMOGLOBIN: 12.9 g/dL (ref 12.0–16.0)
Lymphocytes Relative: 24 %
Lymphs Abs: 1.9 10*3/uL (ref 1.0–3.6)
MCH: 30.5 pg (ref 26.0–34.0)
MCHC: 33.3 g/dL (ref 32.0–36.0)
MCV: 91.5 fL (ref 80.0–100.0)
MONO ABS: 0.4 10*3/uL (ref 0.2–0.9)
Monocytes Relative: 6 %
NEUTROS ABS: 5.3 10*3/uL (ref 1.4–6.5)
NEUTROS PCT: 66 %
Platelets: 295 10*3/uL (ref 150–440)
RBC: 4.23 MIL/uL (ref 3.80–5.20)
RDW: 12.7 % (ref 11.5–14.5)
WBC: 7.9 10*3/uL (ref 3.6–11.0)

## 2017-03-24 LAB — LITHIUM LEVEL: LITHIUM LVL: 0.6 mmol/L (ref 0.60–1.20)

## 2017-03-26 ENCOUNTER — Telehealth: Payer: Self-pay | Admitting: Family Medicine

## 2017-03-26 NOTE — Telephone Encounter (Signed)
errenous °

## 2017-03-26 NOTE — Telephone Encounter (Signed)
PLEASE CALL PT ABOUT RESULTS OF LABS

## 2017-03-26 NOTE — Telephone Encounter (Signed)
Left a message for them to return my call.

## 2017-04-01 ENCOUNTER — Other Ambulatory Visit: Payer: Self-pay | Admitting: Family Medicine

## 2017-04-01 NOTE — Telephone Encounter (Signed)
Patient requesting refill of Vitamin D to [facility].

## 2017-04-07 ENCOUNTER — Other Ambulatory Visit: Payer: Self-pay | Admitting: Family Medicine

## 2017-04-07 DIAGNOSIS — K5909 Other constipation: Secondary | ICD-10-CM

## 2017-04-07 NOTE — Telephone Encounter (Signed)
Pt requesting refill on linzess 181mcg capsules. Please send to [facility]. She is not completely out  [phone]

## 2017-04-08 MED ORDER — LINACLOTIDE 145 MCG PO CAPS: ORAL_CAPSULE | ORAL | 5 refills | Status: DC

## 2017-04-16 ENCOUNTER — Other Ambulatory Visit
Admission: RE | Admit: 2017-04-16 | Discharge: 2017-04-16 | Disposition: A | Discharge status: Home / Self Care | Payer: Managed Care, Other (non HMO) | Source: Ambulatory Visit | Attending: Psychiatry | Admitting: Psychiatry

## 2017-04-16 DIAGNOSIS — Z79899 Other long term (current) drug therapy: Secondary | ICD-10-CM | POA: Insufficient documentation

## 2017-04-16 DIAGNOSIS — F259 Schizoaffective disorder, unspecified: Secondary | ICD-10-CM | POA: Insufficient documentation

## 2017-04-16 LAB — CBC WITH DIFFERENTIAL/PLATELET
BASOS ABS: 0.1 10*3/uL (ref 0–0.1)
Basophils Relative: 1 %
Eosinophils Absolute: 0.2 10*3/uL (ref 0–0.7)
Eosinophils Relative: 4 %
HEMATOCRIT: 38.1 % (ref 35.0–47.0)
HEMOGLOBIN: 12.8 g/dL (ref 12.0–16.0)
LYMPHS PCT: 24 %
Lymphs Abs: 1.5 10*3/uL (ref 1.0–3.6)
MCH: 30.9 pg (ref 26.0–34.0)
MCHC: 33.6 g/dL (ref 32.0–36.0)
MCV: 92.1 fL (ref 80.0–100.0)
Monocytes Absolute: 0.3 10*3/uL (ref 0.2–0.9)
Monocytes Relative: 5 %
NEUTROS ABS: 4.2 10*3/uL (ref 1.4–6.5)
NEUTROS PCT: 66 %
Platelets: 247 10*3/uL (ref 150–440)
RBC: 4.13 MIL/uL (ref 3.80–5.20)
RDW: 13.1 % (ref 11.5–14.5)
WBC: 6.3 10*3/uL (ref 3.6–11.0)

## 2017-04-16 LAB — LITHIUM LEVEL: Lithium Lvl: 0.7 mmol/L (ref 0.60–1.20)

## 2017-05-20 ENCOUNTER — Other Ambulatory Visit
Admission: RE | Admit: 2017-05-20 | Discharge: 2017-05-20 | Disposition: A | Discharge status: Home / Self Care | Payer: Managed Care, Other (non HMO) | Source: Ambulatory Visit | Attending: Psychiatry | Admitting: Psychiatry

## 2017-05-20 DIAGNOSIS — Z79899 Other long term (current) drug therapy: Secondary | ICD-10-CM | POA: Diagnosis not present

## 2017-05-20 LAB — CBC WITH DIFFERENTIAL/PLATELET
BASOS PCT: 1 %
Basophils Absolute: 0 10*3/uL (ref 0–0.1)
EOS ABS: 0.3 10*3/uL (ref 0–0.7)
Eosinophils Relative: 3 %
HCT: 38.7 % (ref 35.0–47.0)
HEMOGLOBIN: 12.7 g/dL (ref 12.0–16.0)
Lymphocytes Relative: 27 %
Lymphs Abs: 2.2 10*3/uL (ref 1.0–3.6)
MCH: 30.1 pg (ref 26.0–34.0)
MCHC: 32.7 g/dL (ref 32.0–36.0)
MCV: 91.9 fL (ref 80.0–100.0)
MONOS PCT: 6 %
Monocytes Absolute: 0.5 10*3/uL (ref 0.2–0.9)
Neutro Abs: 5.2 10*3/uL (ref 1.4–6.5)
Neutrophils Relative %: 63 %
Platelets: 249 10*3/uL (ref 150–440)
RBC: 4.21 MIL/uL (ref 3.80–5.20)
RDW: 13 % (ref 11.5–14.5)
WBC: 8.3 10*3/uL (ref 3.6–11.0)

## 2017-05-20 LAB — LITHIUM LEVEL: LITHIUM LVL: 0.65 mmol/L (ref 0.60–1.20)

## 2017-05-28 ENCOUNTER — Ambulatory Visit (INDEPENDENT_AMBULATORY_CARE_PROVIDER_SITE_OTHER): Payer: Managed Care, Other (non HMO) | Admitting: Family Medicine

## 2017-05-28 ENCOUNTER — Encounter: Payer: Self-pay | Admitting: Family Medicine

## 2017-05-28 VITALS — BP 118/68 | HR 95 | Temp 98.4°F | Resp 16 | Ht 66.0 in | Wt 146.5 lb

## 2017-05-28 DIAGNOSIS — Z79899 Other long term (current) drug therapy: Secondary | ICD-10-CM

## 2017-05-28 DIAGNOSIS — R739 Hyperglycemia, unspecified: Secondary | ICD-10-CM

## 2017-05-28 DIAGNOSIS — Z1231 Encounter for screening mammogram for malignant neoplasm of breast: Secondary | ICD-10-CM

## 2017-05-28 DIAGNOSIS — Z1239 Encounter for other screening for malignant neoplasm of breast: Secondary | ICD-10-CM

## 2017-05-28 DIAGNOSIS — E781 Pure hyperglyceridemia: Secondary | ICD-10-CM

## 2017-05-28 DIAGNOSIS — Z124 Encounter for screening for malignant neoplasm of cervix: Secondary | ICD-10-CM | POA: Diagnosis not present

## 2017-05-28 DIAGNOSIS — Z01419 Encounter for gynecological examination (general) (routine) without abnormal findings: Secondary | ICD-10-CM

## 2017-05-28 MED ORDER — ASPIRIN EC 81 MG PO TBEC: 81.0000 mg | DELAYED_RELEASE_TABLET | Freq: Every day | ORAL | 0 refills | Status: DC

## 2017-05-28 NOTE — Progress Notes (Signed)
Name: Carla Thompson   MRN: [id number]    DOB: 07-25-1961   Date:05/28/2017       Progress Note  Subjective  Chief Complaint  Chief Complaint  Patient presents with  . Annual Exam    HPI  Well Woman: she is sexually active, she has pain at times during intercourse, going on for many years, she uses [place] jelly, LMP 12/2016, discussed foreplay. Pap smear and colonoscopy are up to date. No breast lump or nipple discharge    Patient Active Problem List   Diagnosis Date Noted  . GERD without esophagitis 02/21/2017  . Tachycardia 02/20/2016  . RLS (restless legs syndrome) 12/21/2015  . IBS (irritable bowel syndrome) 12/21/2015  . Other long term (current) drug therapy 12/08/2015  . Chronic constipation 12/08/2015  . Insomnia, persistent 12/08/2015  . Dermatitis, eczematoid 12/08/2015  . Gravida 2 para 2 12/08/2015  . Hypertriglyceridemia 12/08/2015  . Metallic taste 87/86/7672  . 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 Schizoaffective disorder, bipolar type ([place]) 12/08/2015    Past Surgical History:  Procedure Laterality Date  . ANUS SURGERY  2000   [place]  . TUBAL LIGATION  12/02/1994    Family History  Problem Relation Age of Onset  . Mental illness Sister        Schizophrenia and bipolar  . Cancer Maternal Grandmother        Colon  . Mental illness Paternal Grandmother        Schizophrenia  . Breast cancer Neg Hx     Social History   Social History  . Marital status: Married    Spouse name: N/A  . Number of children: N/A  . Years of education: N/A   Occupational History  . Not on file.   Social History Main Topics  . Smoking status: Never Smoker  . Smokeless tobacco: Never Used  . Alcohol use No  . Drug use: No  . Sexual activity: Not Currently   Other Topics Concern  . Not on file   Social History Narrative  . No narrative on file     Current Outpatient Prescriptions:  .  B Complex-C (SUPER B COMPLEX PO), Take 1 tablet by mouth daily.,  Disp: , Rfl:  .  CINNAMON PO, Take by mouth., Disp: , Rfl:  .  clonazePAM (KLONOPIN) 0.5 MG tablet, Take 0.5 mg by mouth at bedtime., Disp: , Rfl:  .  cloZAPine (CLOZARIL) 100 MG tablet, Take 100-300 mg by mouth 2 (two) times daily. Pt takes one tablet in the morning and three tablets at bedtime. , Disp: , Rfl:  .  docusate sodium (STOOL SOFTENER) 100 MG capsule, Take 100 mg by mouth 2 (two) times daily as needed for mild constipation. , Disp: , Rfl:  .  famotidine (PEPCID) 20 MG tablet, Take 20 mg by mouth 2 (two) times daily., Disp: , Rfl:  .  linaclotide (LINZESS) 145 MCG CAPS capsule, TAKE 1 CAPSULE(145 MCG) BY MOUTH DAILY, Disp: 30 capsule, Rfl: 5 .  lithium carbonate 300 MG capsule, TK 2 CS PO QPM, Disp: , Rfl: 0 .  metoprolol tartrate (LOPRESSOR) 25 MG tablet, Take 0.5 tablets (12.5 mg total) by mouth 2 (two) times daily., Disp: 90 tablet, Rfl: 1 .  vitamin C (ASCORBIC ACID) 500 MG tablet, Take 500 mg by mouth daily., Disp: , Rfl:  .  Vitamin D, Ergocalciferol, (DRISDOL) 50000 units CAPS capsule, TAKE 1 CAPSULE BY MOUTH EVERY 7 DAYS, Disp: 12 capsule, Rfl: 0 .  vitamin E  1000 UNIT capsule, Take 1,000 Units by mouth daily. , Disp: , Rfl:   Allergies  Allergen Reactions  . Escitalopram Hives  . Levsin [Hyoscyamine Sulfate] Other (See Comments)    Reaction:  Patient took medication and felt really spacey     ROS  Constitutional: Negative for fever or sigificant weight change.  Respiratory: Negative for cough and shortness of breath.   Cardiovascular: Negative for chest pain or palpitations.  Gastrointestinal: Negative for abdominal pain, no bowel changes.  Musculoskeletal: Negative for gait problem  (occasionally when going down steps)  or joint swelling.  Skin: Negative for rash.   Neurological: Negative for dizziness or headache.  No other specific complaints in a complete review of systems (except as listed in HPI above).  Objective  Vitals:   05/28/17 1105  BP: 118/68   Pulse: 95  Resp: 16  Temp: 98.4 F (36.9 C)  SpO2: 97%  Weight: 146 lb 8 oz (66.5 kg)  Height: 5\' 6"  (1.676 m)    Body mass index is 23.65 kg/m.  Physical Exam  Constitutional: Patient appears well-developed and well-nourished. No distress.  HENT: Head: Normocephalic and atraumatic. Ears: B TMs ok, no erythema or effusion; Nose: Nose normal. Mouth/Throat: Oropharynx is clear and moist. No oropharyngeal exudate.  Eyes: Conjunctivae and EOM are normal. Pupils are equal, round, and reactive to light. No scleral icterus.  Neck: Normal range of motion. Neck supple. No JVD present. No thyromegaly present.  Cardiovascular: Normal rate, regular rhythm and normal heart sounds.  No murmur heard. No BLE edema. Pulmonary/Chest: Effort normal and breath sounds normal. No respiratory distress. Abdominal: Soft. Bowel sounds are normal, no distension. There is no tenderness. no masses Breast: no lumps or masses, no nipple discharge or rashes FEMALE GENITALIA:  External genitalia normal External urethra normal Pelvic not done RECTAL: not done Musculoskeletal: Normal range of motion, no joint effusions. No gross deformities Neurological: he is alert and oriented to person, place, and time. No cranial nerve deficit. Coordination, balance, strength, speech and gait are normal.  Skin: Skin is warm and dry. No rash noted. No erythema.  Psychiatric: Patient has a normal mood and affect. behavior is normal. Judgment and thought content normal.  Recent Results (from the past 2160 hour(s))  COMPLETE METABOLIC PANEL WITH GFR     Status: Abnormal   Collection Time: 02/28/17  9:00 AM  Result Value Ref Range   Sodium 142 135 - 146 mmol/L   Potassium 4.2 3.5 - 5.3 mmol/L   Chloride 105 98 - 110 mmol/L   CO2 27 20 - 31 mmol/L   Glucose, Bld 103 (H) 65 - 99 mg/dL   BUN 9 7 - 25 mg/dL   Creat 1.05 0.50 - 1.05 mg/dL    Comment:   For patients > or = 55 years of age: The upper reference limit  for Creatinine is approximately 13% higher for people identified as African-American.      Total Bilirubin 0.4 0.2 - 1.2 mg/dL   Alkaline Phosphatase 77 33 - 130 U/L   AST 15 10 - 35 U/L   ALT 14 6 - 29 U/L   Total Protein 6.4 6.1 - 8.1 g/dL   Albumin 3.8 3.6 - 5.1 g/dL   Calcium 9.6 8.6 - 10.4 mg/dL   GFR, Est African American 70 >=60 mL/min   GFR, Est Non African American 60 >=60 mL/min  Lipid panel     Status: Abnormal   Collection Time: 02/28/17  9:00 AM  Result Value Ref Range   Cholesterol 241 (H) <200 mg/dL   Triglycerides 228 (H) <150 mg/dL   HDL 57 >50 mg/dL   Total CHOL/HDL Ratio 4.2 <5.0 Ratio   VLDL 46 (H) <30 mg/dL   LDL Cholesterol 138 (H) <100 mg/dL  Lithium level     Status: None   Collection Time: 03/24/17  1:27 PM  Result Value Ref Range   Lithium Lvl 0.60 0.60 - 1.20 mmol/L  CBC with Differential/Platelet     Status: None   Collection Time: 03/24/17  1:27 PM  Result Value Ref Range   WBC 7.9 3.6 - 11.0 K/uL   RBC 4.23 3.80 - 5.20 MIL/uL   Hemoglobin 12.9 12.0 - 16.0 g/dL   HCT 38.7 35.0 - 47.0 %   MCV 91.5 80.0 - 100.0 fL   MCH 30.5 26.0 - 34.0 pg   MCHC 33.3 32.0 - 36.0 g/dL   RDW 12.7 11.5 - 14.5 %   Platelets 295 150 - 440 K/uL   Neutrophils Relative % 66 %   Neutro Abs 5.3 1.4 - 6.5 K/uL   Lymphocytes Relative 24 %   Lymphs Abs 1.9 1.0 - 3.6 K/uL   Monocytes Relative 6 %   Monocytes Absolute 0.4 0.2 - 0.9 K/uL   Eosinophils Relative 3 %   Eosinophils Absolute 0.2 0 - 0.7 K/uL   Basophils Relative 1 %   Basophils Absolute 0.0 0 - 0.1 K/uL  CBC with Differential/Platelet     Status: None   Collection Time: 04/16/17 11:17 AM  Result Value Ref Range   WBC 6.3 3.6 - 11.0 K/uL   RBC 4.13 3.80 - 5.20 MIL/uL   Hemoglobin 12.8 12.0 - 16.0 g/dL   HCT 38.1 35.0 - 47.0 %   MCV 92.1 80.0 - 100.0 fL   MCH 30.9 26.0 - 34.0 pg   MCHC 33.6 32.0 - 36.0 g/dL   RDW 13.1 11.5 - 14.5 %   Platelets 247 150 - 440 K/uL   Neutrophils Relative % 66 %   Neutro  Abs 4.2 1.4 - 6.5 K/uL   Lymphocytes Relative 24 %   Lymphs Abs 1.5 1.0 - 3.6 K/uL   Monocytes Relative 5 %   Monocytes Absolute 0.3 0.2 - 0.9 K/uL   Eosinophils Relative 4 %   Eosinophils Absolute 0.2 0 - 0.7 K/uL   Basophils Relative 1 %   Basophils Absolute 0.1 0 - 0.1 K/uL  Lithium level     Status: None   Collection Time: 04/16/17 11:17 AM  Result Value Ref Range   Lithium Lvl 0.70 0.60 - 1.20 mmol/L  CBC with Differential/Platelet     Status: None   Collection Time: 05/20/17 12:08 PM  Result Value Ref Range   WBC 8.3 3.6 - 11.0 K/uL   RBC 4.21 3.80 - 5.20 MIL/uL   Hemoglobin 12.7 12.0 - 16.0 g/dL   HCT 38.7 35.0 - 47.0 %   MCV 91.9 80.0 - 100.0 fL   MCH 30.1 26.0 - 34.0 pg   MCHC 32.7 32.0 - 36.0 g/dL   RDW 13.0 11.5 - 14.5 %   Platelets 249 150 - 440 K/uL   Neutrophils Relative % 63 %   Neutro Abs 5.2 1.4 - 6.5 K/uL   Lymphocytes Relative 27 %   Lymphs Abs 2.2 1.0 - 3.6 K/uL   Monocytes Relative 6 %   Monocytes Absolute 0.5 0.2 - 0.9 K/uL   Eosinophils Relative 3 %   Eosinophils Absolute  0.3 0 - 0.7 K/uL   Basophils Relative 1 %   Basophils Absolute 0.0 0 - 0.1 K/uL  Lithium level     Status: None   Collection Time: 05/20/17 12:08 PM  Result Value Ref Range   Lithium Lvl 0.65 0.60 - 1.20 mmol/L     PHQ2/9: Depression screen [facility] 2/9 05/22/2016 02/20/2016 01/23/2016 12/21/2015 12/08/2015  Decreased Interest 0 0 0 1 0  Down, Depressed, Hopeless 1 0 2 3 0  PHQ - 2 Score 1 0 2 4 0  Altered sleeping - - 2 3 -  Tired, decreased energy - - 2 3 -  Change in appetite - - 0 2 -  Feeling bad or failure about yourself  - - 1 1 -  Trouble concentrating - - 0 3 -  Moving slowly or fidgety/restless - - 0 2 -  Suicidal thoughts - - 0 0 -  PHQ-9 Score - - 7 18 -  Difficult doing work/chores - - - Very difficult -     Fall Risk: Fall Risk  08/23/2016 05/22/2016 02/20/2016 01/23/2016 12/21/2015  Falls in the past year? No No Yes No No  Number falls in past yr: - - 2 or more - -   Injury with Fall? - - No - -     Functional Status Survey: Is the patient deaf or have difficulty hearing?: No Does the patient have difficulty seeing, even when wearing glasses/contacts?: No Does the patient have difficulty concentrating, remembering, or making decisions?: Yes (relys on her husband to make decisions, forgetful also) Does the patient have difficulty walking or climbing stairs?: Yes (difficulty going down stairs when in a hurry) Does the patient have difficulty dressing or bathing?: No Does the patient have difficulty doing errands alone such as visiting a doctor's office or shopping?: Yes    Assessment & Plan  1. Well woman exam  Discussed importance of 150 minutes of physical activity weekly, eat two servings of fish weekly, eat one serving of tree nuts ( cashews, pistachios, pecans, almonds.Marland Kitchen) every other day, eat 6 servings of fruit/vegetables daily and drink plenty of water and avoid sweet beverages.  Discussed USPT and aspirin 81 mg daily   2. Breast cancer screening  - MM Digital Screening; Future  3. Cervical cancer screening  Not done , up to date  4. Hypertriglyceridemia  - Lipid panel  5. Hyperglycemia  - Hemoglobin A1c - Insulin, fasting  6. Long-term use of high-risk medication  - COMPLETE METABOLIC PANEL WITH GFR

## 2017-05-28 NOTE — Patient Instructions (Signed)
Preventive Care 40-64 Years, Female Preventive care refers to lifestyle choices and visits with your health care provider that can promote health and wellness. What does preventive care include?  A yearly physical exam. This is also called an annual well check.  Dental exams once or twice a year.  Routine eye exams. Ask your health care provider how often you should have your eyes checked.  Personal lifestyle choices, including: ? Daily care of your teeth and gums. ? Regular physical activity. ? Eating a healthy diet. ? Avoiding tobacco and drug use. ? Limiting alcohol use. ? Practicing safe sex. ? Taking low-dose aspirin daily starting at age 58. ? Taking vitamin and mineral supplements as recommended by your health care provider. What happens during an annual well check? The services and screenings done by your health care provider during your annual well check will depend on your age, overall health, lifestyle risk factors, and family history of disease. Counseling Your health care provider may ask you questions about your:  Alcohol use.  Tobacco use.  Drug use.  Emotional well-being.  Home and relationship well-being.  Sexual activity.  Eating habits.  Work and work Statistician.  Method of birth control.  Menstrual cycle.  Pregnancy history.  Screening You may have the following tests or measurements:  Height, weight, and BMI.  Blood pressure.  Lipid and cholesterol levels. These may be checked every 5 years, or more frequently if you are over 81 years old.  Skin check.  Lung cancer screening. You may have this screening every year starting at age 78 if you have a 30-pack-year history of smoking and currently smoke or have quit within the past 15 years.  Fecal occult blood test (FOBT) of the stool. You may have this test every year starting at age 65.  Flexible sigmoidoscopy or colonoscopy. You may have a sigmoidoscopy every 5 years or a colonoscopy  every 10 years starting at age 30.  Hepatitis C blood test.  Hepatitis B blood test.  Sexually transmitted disease (STD) testing.  Diabetes screening. This is done by checking your blood sugar (glucose) after you have not eaten for a while (fasting). You may have this done every 1-3 years.  Mammogram. This may be done every 1-2 years. Talk to your health care provider about when you should start having regular mammograms. This may depend on whether you have a family history of breast cancer.  BRCA-related cancer screening. This may be done if you have a family history of breast, ovarian, tubal, or peritoneal cancers.  Pelvic exam and Pap test. This may be done every 3 years starting at age 80. Starting at age 36, this may be done every 5 years if you have a Pap test in combination with an HPV test.  Bone density scan. This is done to screen for osteoporosis. You may have this scan if you are at high risk for osteoporosis.  Discuss your test results, treatment options, and if necessary, the need for more tests with your health care provider. Vaccines Your health care provider may recommend certain vaccines, such as:  Influenza vaccine. This is recommended every year.  Tetanus, diphtheria, and acellular pertussis (Tdap, Td) vaccine. You may need a Td booster every 10 years.  Varicella vaccine. You may need this if you have not been vaccinated.  Zoster vaccine. You may need this after age 5.  Measles, mumps, and rubella (MMR) vaccine. You may need at least one dose of MMR if you were born in  1956 or later. You may also need a second dose.  Pneumococcal 13-valent conjugate (PCV13) vaccine. You may need this if you have certain conditions and were not previously vaccinated.  Pneumococcal polysaccharide (PPSV23) vaccine. You may need one or two doses if you smoke cigarettes or if you have certain conditions.  Meningococcal vaccine. You may need this if you have certain  conditions.  Hepatitis A vaccine. You may need this if you have certain conditions or if you travel or work in places where you may be exposed to hepatitis A.  Hepatitis B vaccine. You may need this if you have certain conditions or if you travel or work in places where you may be exposed to hepatitis B.  Haemophilus influenzae type b (Hib) vaccine. You may need this if you have certain conditions.  Talk to your health care provider about which screenings and vaccines you need and how often you need them. This information is not intended to replace advice given to you by your health care provider. Make sure you discuss any questions you have with your health care provider. Document Released: 12/15/2015 Document Revised: 08/07/2016 Document Reviewed: 09/19/2015 Elsevier Interactive Patient Education  2017 [employer].

## 2017-06-13 ENCOUNTER — Other Ambulatory Visit
Admission: RE | Admit: 2017-06-13 | Discharge: 2017-06-13 | Disposition: A | Discharge status: Home / Self Care | Payer: Managed Care, Other (non HMO) | Source: Ambulatory Visit | Attending: Family Medicine | Admitting: Family Medicine

## 2017-06-13 ENCOUNTER — Other Ambulatory Visit
Admission: RE | Admit: 2017-06-13 | Discharge: 2017-06-13 | Disposition: A | Discharge status: Home / Self Care | Payer: Managed Care, Other (non HMO) | Source: Ambulatory Visit | Attending: Psychiatry | Admitting: Psychiatry

## 2017-06-13 DIAGNOSIS — Z79899 Other long term (current) drug therapy: Secondary | ICD-10-CM | POA: Diagnosis present

## 2017-06-13 DIAGNOSIS — F259 Schizoaffective disorder, unspecified: Secondary | ICD-10-CM | POA: Insufficient documentation

## 2017-06-13 LAB — LIPID PANEL
CHOLESTEROL: 222 mg/dL — AB (ref 0–200)
HDL: 54 mg/dL (ref >40)
LDL Cholesterol: 133 mg/dL — ABNORMAL HIGH (ref 0–99)
Total CHOL/HDL Ratio: 4.1 RATIO
Triglycerides: 177 mg/dL — ABNORMAL HIGH (ref <150)
VLDL: 35 mg/dL (ref 0–40)

## 2017-06-13 LAB — COMPREHENSIVE METABOLIC PANEL
ALBUMIN: 3.9 g/dL (ref 3.5–5.0)
ALT: 13 U/L — AB (ref 14–54)
AST: 19 U/L (ref 15–41)
Alkaline Phosphatase: 80 U/L (ref 38–126)
Anion gap: 8 (ref 5–15)
BILIRUBIN TOTAL: 0.5 mg/dL (ref 0.3–1.2)
BUN: 11 mg/dL (ref 6–20)
CO2: 28 mmol/L (ref 22–32)
CREATININE: 1.02 mg/dL — AB (ref 0.44–1.00)
Calcium: 9.3 mg/dL (ref 8.9–10.3)
Chloride: 105 mmol/L (ref 101–111)
GFR calc Af Amer: >60 mL/min (ref >60)
GFR calc non Af Amer: >60 mL/min (ref >60)
GLUCOSE: 108 mg/dL — AB (ref 65–99)
Potassium: 4.4 mmol/L (ref 3.5–5.1)
SODIUM: 141 mmol/L (ref 135–145)
TOTAL PROTEIN: 6.7 g/dL (ref 6.5–8.1)

## 2017-06-13 LAB — CBC WITH DIFFERENTIAL/PLATELET
BASOS ABS: 0 10*3/uL (ref 0–0.1)
Basophils Relative: 1 %
EOS PCT: 4 %
Eosinophils Absolute: 0.3 10*3/uL (ref 0–0.7)
HEMATOCRIT: 38.7 % (ref 35.0–47.0)
Hemoglobin: 12.8 g/dL (ref 12.0–16.0)
LYMPHS PCT: 27 %
Lymphs Abs: 1.8 10*3/uL (ref 1.0–3.6)
MCH: 29.9 pg (ref 26.0–34.0)
MCHC: 33 g/dL (ref 32.0–36.0)
MCV: 90.7 fL (ref 80.0–100.0)
MONO ABS: 0.4 10*3/uL (ref 0.2–0.9)
Monocytes Relative: 5 %
Neutro Abs: 4.3 10*3/uL (ref 1.4–6.5)
Neutrophils Relative %: 63 %
Platelets: 260 10*3/uL (ref 150–440)
RBC: 4.27 MIL/uL (ref 3.80–5.20)
RDW: 13.2 % (ref 11.5–14.5)
WBC: 6.8 10*3/uL (ref 3.6–11.0)

## 2017-06-14 LAB — INSULIN, RANDOM: Insulin: 7.4 u[IU]/mL (ref 2.6–24.9)

## 2017-06-14 LAB — HEMOGLOBIN A1C
Hgb A1c MFr Bld: 5 % (ref 4.8–5.6)
MEAN PLASMA GLUCOSE: 97 mg/dL

## 2017-06-22 ENCOUNTER — Other Ambulatory Visit: Payer: Self-pay | Admitting: Family Medicine

## 2017-06-23 ENCOUNTER — Other Ambulatory Visit: Payer: Self-pay | Admitting: Family Medicine

## 2017-06-23 DIAGNOSIS — Z01419 Encounter for gynecological examination (general) (routine) without abnormal findings: Secondary | ICD-10-CM

## 2017-06-23 NOTE — Telephone Encounter (Signed)
Patient requesting refill of aspirin to [facility].

## 2017-07-10 ENCOUNTER — Other Ambulatory Visit
Admission: RE | Admit: 2017-07-10 | Discharge: 2017-07-10 | Disposition: A | Discharge status: Home / Self Care | Payer: Managed Care, Other (non HMO) | Source: Ambulatory Visit | Attending: Psychiatry | Admitting: Psychiatry

## 2017-07-10 DIAGNOSIS — Z79899 Other long term (current) drug therapy: Secondary | ICD-10-CM | POA: Insufficient documentation

## 2017-07-10 DIAGNOSIS — F209 Schizophrenia, unspecified: Secondary | ICD-10-CM | POA: Insufficient documentation

## 2017-07-10 LAB — CBC WITH DIFFERENTIAL/PLATELET
Basophils Absolute: 0.1 10*3/uL (ref 0–0.1)
Basophils Relative: 1 %
EOS PCT: 3 %
Eosinophils Absolute: 0.3 10*3/uL (ref 0–0.7)
HEMATOCRIT: 39.3 % (ref 35.0–47.0)
HEMOGLOBIN: 13.2 g/dL (ref 12.0–16.0)
LYMPHS ABS: 1.8 10*3/uL (ref 1.0–3.6)
LYMPHS PCT: 23 %
MCH: 30.4 pg (ref 26.0–34.0)
MCHC: 33.5 g/dL (ref 32.0–36.0)
MCV: 90.6 fL (ref 80.0–100.0)
Monocytes Absolute: 0.5 10*3/uL (ref 0.2–0.9)
Monocytes Relative: 6 %
NEUTROS ABS: 5.4 10*3/uL (ref 1.4–6.5)
NEUTROS PCT: 67 %
Platelets: 281 10*3/uL (ref 150–440)
RBC: 4.33 MIL/uL (ref 3.80–5.20)
RDW: 13 % (ref 11.5–14.5)
WBC: 8 10*3/uL (ref 3.6–11.0)

## 2017-07-22 ENCOUNTER — Other Ambulatory Visit: Payer: Self-pay | Admitting: Family Medicine

## 2017-07-22 DIAGNOSIS — Z01419 Encounter for gynecological examination (general) (routine) without abnormal findings: Secondary | ICD-10-CM

## 2017-07-28 ENCOUNTER — Ambulatory Visit
Admission: RE | Admit: 2017-07-28 | Discharge: 2017-07-28 | Disposition: A | Discharge status: Home / Self Care | Payer: Managed Care, Other (non HMO) | Source: Ambulatory Visit | Attending: Family Medicine | Admitting: Family Medicine

## 2017-07-28 DIAGNOSIS — Z1239 Encounter for other screening for malignant neoplasm of breast: Secondary | ICD-10-CM

## 2017-07-28 DIAGNOSIS — Z1231 Encounter for screening mammogram for malignant neoplasm of breast: Secondary | ICD-10-CM | POA: Diagnosis present

## 2017-07-29 ENCOUNTER — Other Ambulatory Visit: Payer: Self-pay

## 2017-07-29 DIAGNOSIS — N6320 Unspecified lump in the left breast, unspecified quadrant: Secondary | ICD-10-CM

## 2017-08-07 ENCOUNTER — Other Ambulatory Visit
Admission: RE | Admit: 2017-08-07 | Discharge: 2017-08-07 | Disposition: A | Discharge status: Home / Self Care | Payer: Managed Care, Other (non HMO) | Source: Ambulatory Visit | Attending: Psychiatry | Admitting: Psychiatry

## 2017-08-07 DIAGNOSIS — E781 Pure hyperglyceridemia: Secondary | ICD-10-CM | POA: Diagnosis present

## 2017-08-07 DIAGNOSIS — Z79899 Other long term (current) drug therapy: Secondary | ICD-10-CM | POA: Diagnosis present

## 2017-08-07 DIAGNOSIS — R739 Hyperglycemia, unspecified: Secondary | ICD-10-CM | POA: Insufficient documentation

## 2017-08-07 LAB — LITHIUM LEVEL: Lithium Lvl: 0.73 mmol/L (ref 0.60–1.20)

## 2017-08-15 ENCOUNTER — Ambulatory Visit
Admission: RE | Admit: 2017-08-15 | Discharge: 2017-08-15 | Disposition: A | Discharge status: Home / Self Care | Payer: Managed Care, Other (non HMO) | Source: Ambulatory Visit | Attending: Family Medicine | Admitting: Family Medicine

## 2017-08-15 DIAGNOSIS — N6324 Unspecified lump in the left breast, lower inner quadrant: Secondary | ICD-10-CM | POA: Insufficient documentation

## 2017-08-15 DIAGNOSIS — N6320 Unspecified lump in the left breast, unspecified quadrant: Secondary | ICD-10-CM

## 2017-08-15 DIAGNOSIS — N6002 Solitary cyst of left breast: Secondary | ICD-10-CM | POA: Insufficient documentation

## 2017-08-25 ENCOUNTER — Ambulatory Visit (INDEPENDENT_AMBULATORY_CARE_PROVIDER_SITE_OTHER): Payer: Managed Care, Other (non HMO) | Admitting: Family Medicine

## 2017-08-25 ENCOUNTER — Encounter: Payer: Self-pay | Admitting: Family Medicine

## 2017-08-25 VITALS — BP 100/60 | HR 93 | Wt 143.7 lb

## 2017-08-25 DIAGNOSIS — N3946 Mixed incontinence: Secondary | ICD-10-CM | POA: Diagnosis not present

## 2017-08-25 DIAGNOSIS — F250 Schizoaffective disorder, bipolar type: Secondary | ICD-10-CM | POA: Diagnosis not present

## 2017-08-25 DIAGNOSIS — R000 Tachycardia, unspecified: Secondary | ICD-10-CM | POA: Diagnosis not present

## 2017-08-25 DIAGNOSIS — R739 Hyperglycemia, unspecified: Secondary | ICD-10-CM | POA: Diagnosis not present

## 2017-08-25 DIAGNOSIS — K5909 Other constipation: Secondary | ICD-10-CM | POA: Diagnosis not present

## 2017-08-25 DIAGNOSIS — E781 Pure hyperglyceridemia: Secondary | ICD-10-CM

## 2017-08-25 DIAGNOSIS — K219 Gastro-esophageal reflux disease without esophagitis: Secondary | ICD-10-CM

## 2017-08-25 MED ORDER — PLECANATIDE 3 MG PO TABS: 1.0000 | ORAL_TABLET | Freq: Every day | ORAL | 2 refills | Status: DC

## 2017-08-25 MED ORDER — METOPROLOL TARTRATE 25 MG PO TABS: 12.5000 mg | ORAL_TABLET | Freq: Two times a day (BID) | ORAL | 1 refills | Status: DC

## 2017-08-25 NOTE — Progress Notes (Signed)
Name: Carla Thompson   MRN: [id number]    DOB: 04-28-1961   Date:08/25/2017       Progress Note  Subjective  Chief Complaint  Chief Complaint  Patient presents with  . Results    labs    HPI   Chronic constipation: she is now on Linzess, no longer soiling, bowel movements were daily on her last visit, but states now going up to 7 days without a bowel movement and sometimes has multiple episodes in one day. No blood or mucus in stools.  Stress and Urge incontinence: she has noticed some urgency lately, also has some leakage, it has been chronic, but getting progressively worse, having to wear a pad. She does not drive and is unable to go due to lack of transportation. Explained that constipation may be a factor and we will try controlling that first.   Schizophrenia: Seeing psychiatrist . No longer has hallucinations.She has been cooking and cleaning again. Able to have friends over. Husband is here with her and thinks she is back to baseline. No paranoia or hallucinations.   GERD: a few months ago she complained to her psychiatrist about burning sensation on the back of her throat. He advised otc Prilosec and she took for about a week and symptoms improved, she is now taking Pepcid and symptoms are under control. Occasionally taking Tums  Tachycardia: doing well on Lopressor half pill twice daily, she denies SOB or chest pain , denies palpitation  Hypertriglyceridemia: last labs improved, we will recheck fasting next visit    Patient Active Problem List   Diagnosis Date Noted  . GERD without esophagitis 02/21/2017  . Tachycardia 02/20/2016  . RLS (restless legs syndrome) 12/21/2015  . IBS (irritable bowel syndrome) 12/21/2015  . Other long term (current) drug therapy 12/08/2015  . Chronic constipation 12/08/2015  . Insomnia, persistent 12/08/2015  . Dermatitis, eczematoid 12/08/2015  . Gravida 2 para 2 12/08/2015  . Hypertriglyceridemia 12/08/2015  . Metallic taste  42/59/5638  . 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 Schizoaffective disorder, bipolar type ([place]) 12/08/2015    Past Surgical History:  Procedure Laterality Date  . ANUS SURGERY  2000   [place]  . TUBAL LIGATION  12/02/1994    Family History  Problem Relation Age of Onset  . Mental illness Sister        Schizophrenia and bipolar  . Cancer Maternal Grandmother        Colon  . Mental illness Paternal Grandmother        Schizophrenia  . Breast cancer Neg Hx     Social History   Social History  . Marital status: Married    Spouse name: N/A  . Number of children: N/A  . Years of education: N/A   Occupational History  . Not on file.   Social History Main Topics  . Smoking status: Never Smoker  . Smokeless tobacco: Never Used  . Alcohol use No  . Drug use: No  . Sexual activity: Not Currently   Other Topics Concern  . Not on file   Social History Narrative  . No narrative on file     Current Outpatient Prescriptions:  .  ASPIRIN LOW DOSE 81 MG EC tablet, TAKE 1 TABLET(81 MG) BY MOUTH DAILY, Disp: 30 tablet, Rfl: 0 .  B Complex-C (SUPER B COMPLEX PO), Take 1 tablet by mouth daily., Disp: , Rfl:  .  CINNAMON PO, Take by mouth., Disp: , Rfl:  .  clonazePAM (KLONOPIN) 0.5 MG  tablet, Take 0.5 mg by mouth at bedtime., Disp: , Rfl:  .  cloZAPine (CLOZARIL) 100 MG tablet, Take 100-300 mg by mouth 2 (two) times daily. Pt takes one tablet in the morning and three tablets at bedtime. , Disp: , Rfl:  .  docusate sodium (STOOL SOFTENER) 100 MG capsule, Take 100 mg by mouth 2 (two) times daily as needed for mild constipation. , Disp: , Rfl:  .  famotidine (PEPCID) 20 MG tablet, Take 20 mg by mouth 2 (two) times daily., Disp: , Rfl:  .  lithium carbonate 300 MG capsule, TK 2 CS PO QPM, Disp: , Rfl: 0 .  vitamin C (ASCORBIC ACID) 500 MG tablet, Take 500 mg by mouth daily., Disp: , Rfl:  .  Vitamin D, Ergocalciferol, (DRISDOL) 50000 units CAPS capsule, TAKE 1 CAPSULE BY MOUTH  EVERY 7 DAYS, Disp: 12 capsule, Rfl: 0 .  vitamin E 1000 UNIT capsule, Take 1,000 Units by mouth daily. , Disp: , Rfl:  .  metoprolol tartrate (LOPRESSOR) 25 MG tablet, Take 0.5 tablets (12.5 mg total) by mouth 2 (two) times daily., Disp: 90 tablet, Rfl: 1 .  Plecanatide (TRULANCE) 3 MG TABS, Take 1 tablet by mouth daily. In place of Linzess for constipation, Disp: 30 tablet, Rfl: 2  Allergies  Allergen Reactions  . Escitalopram Hives  . Levsin [Hyoscyamine Sulfate] Other (See Comments)    Reaction:  Patient took medication and felt really spacey     ROS  Constitutional: Negative for fever or weight change.  Respiratory: Negative for cough and shortness of breath.   Cardiovascular: Negative for chest pain or palpitations.  Gastrointestinal: Negative for abdominal pain, no bowel changes - still constipated   Musculoskeletal: Negative for gait problem or joint swelling.  Skin: Negative for rash.  Neurological: Negative for dizziness or headache.  No other specific complaints in a complete review of systems (except as listed in HPI above).   Objective  Vitals:   08/25/17 1142  BP: 100/60  Pulse: 93  SpO2: 98%  Weight: 143 lb 11.2 oz (65.2 kg)    Body mass index is 23.19 kg/m.  Physical Exam  Constitutional: Patient appears well-developed and well-nourished No distress.  HEENT: head atraumatic, normocephalic, pupils equal and reactive to light, neck supple, throat within normal limits Cardiovascular: Normal rate, regular rhythm and normal heart sounds.  No murmur heard. No BLE edema. Pulmonary/Chest: Effort normal and breath sounds normal. No respiratory distress. Abdominal: Soft.  There is no tenderness. Psychiatric: Patient has a normal mood and affect. behavior is normal. Judgment and thought content normal.  Recent Results (from the past 2160 hour(s))  CBC with Differential/Platelet     Status: None   Collection Time: 06/13/17 10:56 AM  Result Value Ref Range   WBC  6.8 3.6 - 11.0 K/uL   RBC 4.27 3.80 - 5.20 MIL/uL   Hemoglobin 12.8 12.0 - 16.0 g/dL   HCT 38.7 35.0 - 47.0 %   MCV 90.7 80.0 - 100.0 fL   MCH 29.9 26.0 - 34.0 pg   MCHC 33.0 32.0 - 36.0 g/dL   RDW 13.2 11.5 - 14.5 %   Platelets 260 150 - 440 K/uL   Neutrophils Relative % 63 %   Neutro Abs 4.3 1.4 - 6.5 K/uL   Lymphocytes Relative 27 %   Lymphs Abs 1.8 1.0 - 3.6 K/uL   Monocytes Relative 5 %   Monocytes Absolute 0.4 0.2 - 0.9 K/uL   Eosinophils Relative 4 %  Eosinophils Absolute 0.3 0 - 0.7 K/uL   Basophils Relative 1 %   Basophils Absolute 0.0 0 - 0.1 K/uL  Lipid panel     Status: Abnormal   Collection Time: 06/13/17 10:56 AM  Result Value Ref Range   Cholesterol 222 (H) 0 - 200 mg/dL   Triglycerides 177 (H) <150 mg/dL   HDL 54 >40 mg/dL   Total CHOL/HDL Ratio 4.1 RATIO   VLDL 35 0 - 40 mg/dL   LDL Cholesterol 133 (H) 0 - 99 mg/dL    Comment:        Total Cholesterol/HDL:CHD Risk Coronary Heart Disease Risk Table                     Men   Women  1/2 Average Risk   3.4   3.3  Average Risk       5.0   4.4  2 X Average Risk   9.6   7.1  3 X Average Risk  23.4   11.0        Use the calculated Patient Ratio above and the CHD Risk Table to determine the patient's CHD Risk.        ATP III CLASSIFICATION (LDL):  <100     mg/dL   Optimal  100-129  mg/dL   Near or Above                    Optimal  130-159  mg/dL   Borderline  160-189  mg/dL   High  >190     mg/dL   Very High   Insulin, random     Status: None   Collection Time: 06/13/17 10:56 AM  Result Value Ref Range   Insulin 7.4 2.6 - 24.9 uIU/mL    Comment: (NOTE) Performed At: [facility] [place], [place] [id number] Lindon Romp MD [id number]   Hemoglobin A1c     Status: None   Collection Time: 06/13/17 10:56 AM  Result Value Ref Range   Hgb A1c MFr Bld 5.0 4.8 - 5.6 %    Comment: (NOTE)         Pre-diabetes: 5.7 - 6.4         Diabetes: >6.4         Glycemic control for  adults with diabetes: <7.0    Mean Plasma Glucose 97 mg/dL    Comment: (NOTE) Performed At: [facility] [place], [place] [id number] Lindon Romp MD [id number]   Comprehensive metabolic panel     Status: Abnormal   Collection Time: 06/13/17 10:56 AM  Result Value Ref Range   Sodium 141 135 - 145 mmol/L   Potassium 4.4 3.5 - 5.1 mmol/L   Chloride 105 101 - 111 mmol/L   CO2 28 22 - 32 mmol/L   Glucose, Bld 108 (H) 65 - 99 mg/dL   BUN 11 6 - 20 mg/dL   Creatinine, Ser 1.02 (H) 0.44 - 1.00 mg/dL   Calcium 9.3 8.9 - 10.3 mg/dL   Total Protein 6.7 6.5 - 8.1 g/dL   Albumin 3.9 3.5 - 5.0 g/dL   AST 19 15 - 41 U/L   ALT 13 (L) 14 - 54 U/L   Alkaline Phosphatase 80 38 - 126 U/L   Total Bilirubin 0.5 0.3 - 1.2 mg/dL   GFR calc non Af Amer >60 >60 mL/min   GFR calc Af Amer >60 >60 mL/min    Comment: (NOTE) The  eGFR has been calculated using the CKD EPI equation. This calculation has not been validated in all clinical situations. eGFR's persistently <60 mL/min signify possible Chronic Kidney Disease.    Anion gap 8 5 - 15  CBC with Differential/Platelet     Status: None   Collection Time: 07/10/17 12:20 PM  Result Value Ref Range   WBC 8.0 3.6 - 11.0 K/uL   RBC 4.33 3.80 - 5.20 MIL/uL   Hemoglobin 13.2 12.0 - 16.0 g/dL   HCT 39.3 35.0 - 47.0 %   MCV 90.6 80.0 - 100.0 fL   MCH 30.4 26.0 - 34.0 pg   MCHC 33.5 32.0 - 36.0 g/dL   RDW 13.0 11.5 - 14.5 %   Platelets 281 150 - 440 K/uL   Neutrophils Relative % 67 %   Neutro Abs 5.4 1.4 - 6.5 K/uL   Lymphocytes Relative 23 %   Lymphs Abs 1.8 1.0 - 3.6 K/uL   Monocytes Relative 6 %   Monocytes Absolute 0.5 0.2 - 0.9 K/uL   Eosinophils Relative 3 %   Eosinophils Absolute 0.3 0 - 0.7 K/uL   Basophils Relative 1 %   Basophils Absolute 0.1 0 - 0.1 K/uL  Lithium level     Status: None   Collection Time: 08/07/17  5:15 PM  Result Value Ref Range   Lithium Lvl 0.73 0.60 - 1.20 mmol/L       PHQ2/9: Depression screen [facility] 2/9 08/25/2017 05/22/2016 02/20/2016 01/23/2016 12/21/2015  Decreased Interest 0 0 0 0 1  Down, Depressed, Hopeless 0 1 0 2 3  PHQ - 2 Score 0 1 0 2 4  Altered sleeping - - - 2 3  Tired, decreased energy - - - 2 3  Change in appetite - - - 0 2  Feeling bad or failure about yourself  - - - 1 1  Trouble concentrating - - - 0 3  Moving slowly or fidgety/restless - - - 0 2  Suicidal thoughts - - - 0 0  PHQ-9 Score - - - 7 18  Difficult doing work/chores - - - - Very difficult    Fall Risk: Fall Risk  08/25/2017 08/23/2016 05/22/2016 02/20/2016 01/23/2016  Falls in the past year? No No No Yes No  Number falls in past yr: - - - 2 or more -  Injury with Fall? - - - No -  Risk for fall due to : History of fall(s) - - - -      Assessment & Plan  1. Hypertriglyceridemia  Doing well with diet, triglycerides had improved we will recheck next visit  2. Tachycardia  - metoprolol tartrate (LOPRESSOR) 25 MG tablet; Take 0.5 tablets (12.5 mg total) by mouth 2 (two) times daily.  Dispense: 90 tablet; Refill: 1  3. Hyperglycemia  Normal hgbA1C , monitor for now, no symptoms of diabetes  4. Chronic constipation  - Plecanatide (TRULANCE) 3 MG TABS; Take 1 tablet by mouth daily. In place of Linzess for constipation  Dispense: 30 tablet; Refill: 2  5. Schizoaffective disorder, bipolar type ([place])  Continue follow up with Dr. Lynder Parents   6. GERD without esophagitis  Advised to only taking Tums when out of her house, otherwise take H2blockers ( Zantac/Pepcid)    7. Mixed incontinence urge and stress  Discussed referral for PT or and Urologist but she would like to hold off for now

## 2017-08-28 ENCOUNTER — Other Ambulatory Visit: Payer: Self-pay | Admitting: Family Medicine

## 2017-08-28 DIAGNOSIS — Z01419 Encounter for gynecological examination (general) (routine) without abnormal findings: Secondary | ICD-10-CM

## 2017-08-28 NOTE — Telephone Encounter (Signed)
Patient requesting refill of Aspirin 81 mg to [facility].

## 2017-09-08 ENCOUNTER — Other Ambulatory Visit
Admission: RE | Admit: 2017-09-08 | Discharge: 2017-09-08 | Disposition: A | Discharge status: Home / Self Care | Payer: Managed Care, Other (non HMO) | Source: Ambulatory Visit | Attending: Psychiatry | Admitting: Psychiatry

## 2017-09-08 DIAGNOSIS — Z79899 Other long term (current) drug therapy: Secondary | ICD-10-CM | POA: Diagnosis present

## 2017-09-08 LAB — CBC WITH DIFFERENTIAL/PLATELET
BASOS ABS: 0.1 10*3/uL (ref 0–0.1)
BASOS PCT: 1 %
EOS ABS: 0.3 10*3/uL (ref 0–0.7)
Eosinophils Relative: 3 %
HEMATOCRIT: 37.2 % (ref 35.0–47.0)
HEMOGLOBIN: 12.7 g/dL (ref 12.0–16.0)
Lymphocytes Relative: 23 %
Lymphs Abs: 2 10*3/uL (ref 1.0–3.6)
MCH: 30.7 pg (ref 26.0–34.0)
MCHC: 34 g/dL (ref 32.0–36.0)
MCV: 90.3 fL (ref 80.0–100.0)
MONOS PCT: 6 %
Monocytes Absolute: 0.5 10*3/uL (ref 0.2–0.9)
NEUTROS ABS: 5.8 10*3/uL (ref 1.4–6.5)
NEUTROS PCT: 67 %
Platelets: 278 10*3/uL (ref 150–440)
RBC: 4.12 MIL/uL (ref 3.80–5.20)
RDW: 13.1 % (ref 11.5–14.5)
WBC: 8.7 10*3/uL (ref 3.6–11.0)

## 2017-09-19 ENCOUNTER — Other Ambulatory Visit: Payer: Self-pay | Admitting: Family Medicine

## 2017-09-23 ENCOUNTER — Telehealth: Payer: Self-pay | Admitting: Family Medicine

## 2017-09-23 NOTE — Telephone Encounter (Signed)
Copied from [place] #[id number]. Topic: Quick Communication - See Telephone Encounter >> Sep 23, 2017 10:00 AM Robina Ade, Helene Kelp D wrote: CRM for notification. See Telephone encounter for: 09/23/17. Patient called and would like to talk to provider or CMA about her medication reaction to Truliance. Please call patient back, thanks.

## 2017-09-24 ENCOUNTER — Ambulatory Visit: Payer: Self-pay

## 2017-09-24 NOTE — Telephone Encounter (Signed)
Discussed with pt. Concerns about constipation.  Reported she doesn't feel the Trulance is any more effective than the Linzess.  Reported she goes 3-4 days between [facility], and then has a very large BM with fecal leakage after the BM.  Reviewed guidelines of managing constipation.  Is stating she just saw Dr. Ancil Boozer last month, and doesn't want to make another appt.  Is asking if Dr. Ancil Boozer feels she should continue taking the Trulance, or go back on the [place]?  Advised will send this message to the office and request further advice.      Reason for Disposition . Unable to have a bowel movement (BM) without manually removing stool (using finger to pull out stool or perform disimpaction)  Answer Assessment - Initial Assessment Questions 1. STOOL PATTERN OR FREQUENCY: "How often do you pass bowel movements (BMs)?"  (Normal range: tid to q 3 days)  "When was the last BM passed?"       Goes 3-4 days between stools.  Last BM was yesterday; described very large.  2. STRAINING: "Do you have to strain to have a BM?"      Some straining; tries to avoid this. 3. RECTAL PAIN: "Does your rectum hurt when the stool comes out?" If so, ask: "Do you have hemorrhoids? How bad is the pain?"  (Scale 1-10; or mild, moderate, severe)     No rectal pain; more stomach pain 4. STOOL COMPOSITION: "Are the stools hard?"      Describes as "solid" 5. BLOOD ON STOOLS: "Has there been any blood on the toilet tissue or on the surface of the BM?" If so, ask: "When was the last time?"      No bleeding 6. CHRONIC CONSTIPATION: "Is this a new problem for you?"  If no, ask: How long have you had this problem?" (days, weeks, months)      Several months 7. CHANGES IN DIET: "Have there been any recent changes in your diet?"      Trying to increase fruits and vegetables and oatmeal 8. MEDICATIONS: "Have you been taking any new medications?"     Clozapine dose changed 9. LAXATIVES: "Have you been using any laxatives or enemas?"  If  yes, ask "What, how often, and when was the last time?"     Takes 2 stool softeners daily 10. CAUSE: "What do you think is causing the constipation?"        Clozapine 11. OTHER SYMPTOMS: "Do you have any other symptoms?" (e.g., abdominal pain, fever, vomiting)       Abdominal pain 12. PREGNANCY: "Is there any chance you are pregnant?" "When was your last menstrual period?"       no  Protocols used: CONSTIPATION-A-AH

## 2017-09-25 NOTE — Telephone Encounter (Signed)
Please see Triage note from yesterday, 10/24; the pt. called back today.  She feels the Trulance is not doing much for the constipation.  She said she picked up another prescription for it today, but said she feels she needs to go back on the [place].  Is asking for advise from Dr. Ancil Boozer.  The pt. is eager to hear some direction today

## 2017-09-25 NOTE — Telephone Encounter (Signed)
Patient calling back. For follow up

## 2017-09-26 ENCOUNTER — Other Ambulatory Visit: Payer: Self-pay | Admitting: Family Medicine

## 2017-09-26 ENCOUNTER — Telehealth: Payer: Self-pay | Admitting: Family Medicine

## 2017-09-26 MED ORDER — LINACLOTIDE 145 MCG PO CAPS: 145.0000 ug | ORAL_CAPSULE | Freq: Every day | ORAL | 0 refills | Status: DC

## 2017-09-26 NOTE — Telephone Encounter (Signed)
Changed back to Linzess and if not better needs to see GI

## 2017-09-26 NOTE — Telephone Encounter (Signed)
One or the other, not both, I can send her to Gastroenterologist

## 2017-09-26 NOTE — Telephone Encounter (Signed)
Patient will switch back to Linzess. She does not want to see GI. She has been having nausea but does not want to come in to be evaluated.

## 2017-09-26 NOTE — Telephone Encounter (Signed)
Copied from [place]. Topic: Quick Communication - See Telephone Encounter >> Sep 26, 2017 11:02 AM Corie Chiquito, NT wrote: CRM for notification. See Telephone encounter for:  09/26/17.Patient called because she is having problems with her old medication  Linzess but was giving a new medication Trulance and having trouble with that one as well. Would like to speak with someone about which medication she should continune taking.

## 2017-09-30 ENCOUNTER — Other Ambulatory Visit: Payer: Self-pay | Admitting: Family Medicine

## 2017-09-30 DIAGNOSIS — K5909 Other constipation: Secondary | ICD-10-CM

## 2017-09-30 NOTE — Telephone Encounter (Signed)
Refill request for general medication: Linzess  Last office visit: 08/25/2017  Last physical exam:  05/28/2017  Next appt: 11/17/2017

## 2017-10-06 ENCOUNTER — Other Ambulatory Visit
Admission: RE | Admit: 2017-10-06 | Discharge: 2017-10-06 | Disposition: A | Discharge status: Home / Self Care | Payer: Managed Care, Other (non HMO) | Source: Ambulatory Visit | Attending: Psychiatry | Admitting: Psychiatry

## 2017-10-06 DIAGNOSIS — Z79899 Other long term (current) drug therapy: Secondary | ICD-10-CM | POA: Insufficient documentation

## 2017-10-06 DIAGNOSIS — F259 Schizoaffective disorder, unspecified: Secondary | ICD-10-CM | POA: Diagnosis not present

## 2017-10-06 LAB — LITHIUM LEVEL: Lithium Lvl: 0.65 mmol/L (ref 0.60–1.20)

## 2017-10-08 ENCOUNTER — Other Ambulatory Visit
Admission: RE | Admit: 2017-10-08 | Discharge: 2017-10-08 | Disposition: A | Discharge status: Home / Self Care | Payer: Managed Care, Other (non HMO) | Source: Ambulatory Visit | Attending: Psychiatry | Admitting: Psychiatry

## 2017-10-08 DIAGNOSIS — F259 Schizoaffective disorder, unspecified: Secondary | ICD-10-CM | POA: Insufficient documentation

## 2017-10-08 DIAGNOSIS — Z79899 Other long term (current) drug therapy: Secondary | ICD-10-CM | POA: Insufficient documentation

## 2017-10-08 LAB — CBC WITH DIFFERENTIAL/PLATELET
Basophils Absolute: 0 10*3/uL (ref 0–0.1)
Basophils Relative: 1 %
EOS ABS: 0.3 10*3/uL (ref 0–0.7)
EOS PCT: 3 %
HCT: 38.1 % (ref 35.0–47.0)
HEMOGLOBIN: 12.4 g/dL (ref 12.0–16.0)
LYMPHS ABS: 1.9 10*3/uL (ref 1.0–3.6)
LYMPHS PCT: 22 %
MCH: 30.1 pg (ref 26.0–34.0)
MCHC: 32.6 g/dL (ref 32.0–36.0)
MCV: 92.2 fL (ref 80.0–100.0)
MONOS PCT: 6 %
Monocytes Absolute: 0.5 10*3/uL (ref 0.2–0.9)
Neutro Abs: 5.8 10*3/uL (ref 1.4–6.5)
Neutrophils Relative %: 68 %
PLATELETS: 287 10*3/uL (ref 150–440)
RBC: 4.13 MIL/uL (ref 3.80–5.20)
RDW: 12.6 % (ref 11.5–14.5)
WBC: 8.5 10*3/uL (ref 3.6–11.0)

## 2017-11-03 ENCOUNTER — Other Ambulatory Visit
Admission: RE | Admit: 2017-11-03 | Discharge: 2017-11-03 | Disposition: A | Discharge status: Home / Self Care | Payer: Managed Care, Other (non HMO) | Source: Ambulatory Visit | Attending: Psychiatry | Admitting: Psychiatry

## 2017-11-03 DIAGNOSIS — Z79899 Other long term (current) drug therapy: Secondary | ICD-10-CM | POA: Diagnosis not present

## 2017-11-03 DIAGNOSIS — F259 Schizoaffective disorder, unspecified: Secondary | ICD-10-CM | POA: Insufficient documentation

## 2017-11-03 LAB — CBC WITH DIFFERENTIAL/PLATELET
BASOS PCT: 1 %
Basophils Absolute: 0 10*3/uL (ref 0–0.1)
Eosinophils Absolute: 0.3 10*3/uL (ref 0–0.7)
Eosinophils Relative: 4 %
HEMATOCRIT: 38.2 % (ref 35.0–47.0)
Hemoglobin: 12.6 g/dL (ref 12.0–16.0)
LYMPHS PCT: 24 %
Lymphs Abs: 2 10*3/uL (ref 1.0–3.6)
MCH: 30.5 pg (ref 26.0–34.0)
MCHC: 33 g/dL (ref 32.0–36.0)
MCV: 92.4 fL (ref 80.0–100.0)
MONO ABS: 0.5 10*3/uL (ref 0.2–0.9)
MONOS PCT: 7 %
NEUTROS ABS: 5.4 10*3/uL (ref 1.4–6.5)
Neutrophils Relative %: 64 %
Platelets: 288 10*3/uL (ref 150–440)
RBC: 4.14 MIL/uL (ref 3.80–5.20)
RDW: 12.8 % (ref 11.5–14.5)
WBC: 8.3 10*3/uL (ref 3.6–11.0)

## 2017-11-17 ENCOUNTER — Encounter: Payer: Self-pay | Admitting: Family Medicine

## 2017-11-17 ENCOUNTER — Ambulatory Visit (INDEPENDENT_AMBULATORY_CARE_PROVIDER_SITE_OTHER): Payer: Managed Care, Other (non HMO) | Admitting: Family Medicine

## 2017-11-17 VITALS — BP 90/60 | HR 96 | Resp 12 | Ht 66.0 in | Wt 141.3 lb

## 2017-11-17 DIAGNOSIS — R000 Tachycardia, unspecified: Secondary | ICD-10-CM

## 2017-11-17 DIAGNOSIS — E559 Vitamin D deficiency, unspecified: Secondary | ICD-10-CM

## 2017-11-17 DIAGNOSIS — E781 Pure hyperglyceridemia: Secondary | ICD-10-CM

## 2017-11-17 DIAGNOSIS — K5909 Other constipation: Secondary | ICD-10-CM

## 2017-11-17 DIAGNOSIS — F250 Schizoaffective disorder, bipolar type: Secondary | ICD-10-CM

## 2017-11-17 DIAGNOSIS — Z79899 Other long term (current) drug therapy: Secondary | ICD-10-CM

## 2017-11-17 MED ORDER — LINACLOTIDE 145 MCG PO CAPS: ORAL_CAPSULE | ORAL | 5 refills | Status: DC

## 2017-11-17 MED ORDER — VITAMIN D (ERGOCALCIFEROL) 1.25 MG (50000 UNIT) PO CAPS: ORAL_CAPSULE | ORAL | 0 refills | Status: DC

## 2017-11-17 MED ORDER — METOPROLOL TARTRATE 25 MG PO TABS: 12.5000 mg | ORAL_TABLET | Freq: Two times a day (BID) | ORAL | 1 refills | Status: DC

## 2017-11-17 NOTE — Progress Notes (Signed)
Name: Carla Thompson   MRN: [id number]    DOB: 08-14-1961   Date:11/17/2017       Progress Note  Subjective  Chief Complaint  Chief Complaint  Patient presents with  . Constipation  . Follow-up    HPI  Chronic constipation: she is back on Linzess, no longer soiling, she had some diarrhea last week and states that stopped taking stool softeners, but is back on regular regiment. She has bowel movements every 3-4 days. Bristol scale is type 3 or 4 , not straining.   Stress and Urge incontinence: she has noticed occasional urgency l it has been chronic, states nocturia has improved. Trying to drink before going to bed   Schizophrenia: Seeing psychiatrist . No longer has hallucinations.She has been cooking and cleaning again. Able to have friends over. Husband is here with her and thinks she is back to baseline. No paranoia or hallucinations.   GERD: taking prn tums and is doing well.   Tachycardia: doing well on Lopressor half pill twice daily, she denies SOB or chest pain , denies palpitation - no side effects of medication   Hypertriglyceridemia: last labs improved, we will recheck it today   Patient Active Problem List   Diagnosis Date Noted  . GERD without esophagitis 02/21/2017  . Tachycardia 02/20/2016  . RLS (restless legs syndrome) 12/21/2015  . IBS (irritable bowel syndrome) 12/21/2015  . Other long term (current) drug therapy 12/08/2015  . Chronic constipation 12/08/2015  . Insomnia, persistent 12/08/2015  . Dermatitis, eczematoid 12/08/2015  . Gravida 2 para 2 12/08/2015  . Hypertriglyceridemia 12/08/2015  . Metallic taste 81/44/8185  . 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 Schizoaffective disorder, bipolar type ([place]) 12/08/2015    Past Surgical History:  Procedure Laterality Date  . ANUS SURGERY  2000   [place]  . TUBAL LIGATION  12/02/1994    Family History  Problem Relation Age of Onset  . Mental illness Sister        Schizophrenia and  bipolar  . Cancer Maternal Grandmother        Colon  . Mental illness Paternal Grandmother        Schizophrenia  . Breast cancer Neg Hx     Social History   Socioeconomic History  . Marital status: Married    Spouse name: Not on file  . Number of children: Not on file  . Years of education: Not on file  . Highest education level: Not on file  Social Needs  . Financial resource strain: Not on file  . Food insecurity - worry: Not on file  . Food insecurity - inability: Not on file  . Transportation needs - medical: Not on file  . Transportation needs - non-medical: Not on file  Occupational History  . Not on file  Tobacco Use  . Smoking status: Never Smoker  . Smokeless tobacco: Never Used  Substance and Sexual Activity  . Alcohol use: No    Alcohol/week: 0.0 oz  . Drug use: No  . Sexual activity: Not Currently  Other Topics Concern  . Not on file  Social History Narrative  . Not on file     Current Outpatient Medications:  .  ASPIRIN LOW DOSE 81 MG EC tablet, TAKE 1 TABLET(81 MG) BY MOUTH DAILY, Disp: 30 tablet, Rfl: 11 .  B Complex-C (SUPER B COMPLEX PO), Take 1 tablet by mouth daily., Disp: , Rfl:  .  CINNAMON PO, Take by mouth., Disp: , Rfl:  .  clonazePAM (KLONOPIN) 0.5 MG tablet, Take 0.5 mg by mouth at bedtime., Disp: , Rfl:  .  cloZAPine (CLOZARIL) 100 MG tablet, Take 100-300 mg by mouth 2 (two) times daily. Pt takes one tablet in the morning and three tablets at bedtime. , Disp: , Rfl:  .  docusate sodium (STOOL SOFTENER) 100 MG capsule, Take 100 mg by mouth 2 (two) times daily as needed for mild constipation. , Disp: , Rfl:  .  famotidine (PEPCID) 20 MG tablet, Take 20 mg by mouth 2 (two) times daily., Disp: , Rfl:  .  linaclotide (LINZESS) 145 MCG CAPS capsule, TAKE 1 CAPSULE(145 MCG) BY MOUTH DAILY, Disp: 30 capsule, Rfl: 5 .  lithium carbonate 300 MG capsule, TK 2 CS PO QPM, Disp: , Rfl: 0 .  metoprolol tartrate (LOPRESSOR) 25 MG tablet, Take 0.5 tablets  (12.5 mg total) by mouth 2 (two) times daily., Disp: 90 tablet, Rfl: 1 .  vitamin C (ASCORBIC ACID) 500 MG tablet, Take 500 mg by mouth daily., Disp: , Rfl:  .  Vitamin D, Ergocalciferol, (DRISDOL) 50000 units CAPS capsule, TAKE 1 CAPSULE BY MOUTH EVERY 7 DAYS, Disp: 12 capsule, Rfl: 0 .  vitamin E 1000 UNIT capsule, Take 1,000 Units by mouth daily. , Disp: , Rfl:   Allergies  Allergen Reactions  . Escitalopram Hives  . Levsin [Hyoscyamine Sulfate] Other (See Comments)    Reaction:  Patient took medication and felt really spacey     ROS  Ten systems reviewed and is negative except as mentioned in HPI   Objective  Vitals:   11/17/17 1208  BP: 90/60  Pulse: 96  Resp: 12  SpO2: 98%  Weight: 141 lb 4.8 oz (64.1 kg)  Height: 5\' 6"  (1.676 m)    Body mass index is 22.81 kg/m.  Physical Exam  Constitutional: Patient appears well-developed and well-nourished.  No distress.  HEENT: head atraumatic, normocephalic, pupils equal and reactive to light, neck supple, throat within normal limits Cardiovascular: Normal rate, regular rhythm and normal heart sounds.  No murmur heard. No BLE edema. Pulmonary/Chest: Effort normal and breath sounds normal. No respiratory distress. Abdominal: Soft.  There is no tenderness. Psychiatric: Patient has a normal mood and affect. behavior is normal. Judgment and thought content normal.  Recent Results (from the past 2160 hour(s))  CBC with Differential/Platelet     Status: None   Collection Time: 09/08/17 12:22 PM  Result Value Ref Range   WBC 8.7 3.6 - 11.0 K/uL   RBC 4.12 3.80 - 5.20 MIL/uL   Hemoglobin 12.7 12.0 - 16.0 g/dL   HCT 37.2 35.0 - 47.0 %   MCV 90.3 80.0 - 100.0 fL   MCH 30.7 26.0 - 34.0 pg   MCHC 34.0 32.0 - 36.0 g/dL   RDW 13.1 11.5 - 14.5 %   Platelets 278 150 - 440 K/uL   Neutrophils Relative % 67 %   Neutro Abs 5.8 1.4 - 6.5 K/uL   Lymphocytes Relative 23 %   Lymphs Abs 2.0 1.0 - 3.6 K/uL   Monocytes Relative 6 %    Monocytes Absolute 0.5 0.2 - 0.9 K/uL   Eosinophils Relative 3 %   Eosinophils Absolute 0.3 0 - 0.7 K/uL   Basophils Relative 1 %   Basophils Absolute 0.1 0 - 0.1 K/uL  Lithium level     Status: None   Collection Time: 10/06/17 12:01 PM  Result Value Ref Range   Lithium Lvl 0.65 0.60 - 1.20 mmol/L  CBC with  Differential/Platelet     Status: None   Collection Time: 10/08/17 12:22 PM  Result Value Ref Range   WBC 8.5 3.6 - 11.0 K/uL   RBC 4.13 3.80 - 5.20 MIL/uL   Hemoglobin 12.4 12.0 - 16.0 g/dL   HCT 38.1 35.0 - 47.0 %   MCV 92.2 80.0 - 100.0 fL   MCH 30.1 26.0 - 34.0 pg   MCHC 32.6 32.0 - 36.0 g/dL   RDW 12.6 11.5 - 14.5 %   Platelets 287 150 - 440 K/uL   Neutrophils Relative % 68 %   Neutro Abs 5.8 1.4 - 6.5 K/uL   Lymphocytes Relative 22 %   Lymphs Abs 1.9 1.0 - 3.6 K/uL   Monocytes Relative 6 %   Monocytes Absolute 0.5 0.2 - 0.9 K/uL   Eosinophils Relative 3 %   Eosinophils Absolute 0.3 0 - 0.7 K/uL   Basophils Relative 1 %   Basophils Absolute 0.0 0 - 0.1 K/uL  CBC with Differential/Platelet     Status: None   Collection Time: 11/03/17 12:23 PM  Result Value Ref Range   WBC 8.3 3.6 - 11.0 K/uL   RBC 4.14 3.80 - 5.20 MIL/uL   Hemoglobin 12.6 12.0 - 16.0 g/dL   HCT 38.2 35.0 - 47.0 %   MCV 92.4 80.0 - 100.0 fL   MCH 30.5 26.0 - 34.0 pg   MCHC 33.0 32.0 - 36.0 g/dL   RDW 12.8 11.5 - 14.5 %   Platelets 288 150 - 440 K/uL   Neutrophils Relative % 64 %   Neutro Abs 5.4 1.4 - 6.5 K/uL   Lymphocytes Relative 24 %   Lymphs Abs 2.0 1.0 - 3.6 K/uL   Monocytes Relative 7 %   Monocytes Absolute 0.5 0.2 - 0.9 K/uL   Eosinophils Relative 4 %   Eosinophils Absolute 0.3 0 - 0.7 K/uL   Basophils Relative 1 %   Basophils Absolute 0.0 0 - 0.1 K/uL      PHQ2/9: Depression screen [facility] 2/9 08/25/2017 05/22/2016 02/20/2016 01/23/2016 12/21/2015  Decreased Interest 0 0 0 0 1  Down, Depressed, Hopeless 0 1 0 2 3  PHQ - 2 Score 0 1 0 2 4  Altered sleeping - - - 2 3  Tired,  decreased energy - - - 2 3  Change in appetite - - - 0 2  Feeling bad or failure about yourself  - - - 1 1  Trouble concentrating - - - 0 3  Moving slowly or fidgety/restless - - - 0 2  Suicidal thoughts - - - 0 0  PHQ-9 Score - - - 7 18  Difficult doing work/chores - - - - Very difficult     Fall Risk: Fall Risk  11/17/2017 08/25/2017 08/23/2016 05/22/2016 02/20/2016  Falls in the past year? No No No No Yes  Number falls in past yr: - - - - 2 or more  Injury with Fall? - - - - No  Risk for fall due to : - History of fall(s) - - -     Functional Status Survey: Is the patient deaf or have difficulty hearing?: No Does the patient have difficulty seeing, even when wearing glasses/contacts?: No Does the patient have difficulty concentrating, remembering, or making decisions?: No Does the patient have difficulty walking or climbing stairs?: No Does the patient have difficulty dressing or bathing?: No Does the patient have difficulty doing errands alone such as visiting a doctor's office or shopping?: No  Assessment & Plan  1. Hypertriglyceridemia  - Lipid panel  2. Schizoaffective disorder, bipolar type ([place])  Continue follow up with psychiatrist  3. Long-term use of high-risk medication  - COMPLETE METABOLIC PANEL WITH GFR  4. Chronic constipation  Continue medication   - linaclotide (LINZESS) 145 MCG CAPS capsule; TAKE 1 CAPSULE(145 MCG) BY MOUTH DAILY  Dispense: 30 capsule; Refill: 5  5. Tachycardia  - metoprolol tartrate (LOPRESSOR) 25 MG tablet; Take 0.5 tablets (12.5 mg total) by mouth 2 (two) times daily.  Dispense: 90 tablet; Refill: 1  6. Vitamin D deficiency  - Vitamin D, Ergocalciferol, (DRISDOL) 50000 units CAPS capsule; TAKE 1 CAPSULE BY MOUTH EVERY 7 DAYS  Dispense: 12 capsule; Refill: 0

## 2017-11-18 LAB — LIPID PANEL
Cholesterol: 246 mg/dL — ABNORMAL HIGH (ref <200)
HDL: 60 mg/dL (ref >50)
LDL Cholesterol (Calc): 149 mg/dL (calc) — ABNORMAL HIGH
Non-HDL Cholesterol (Calc): 186 mg/dL (calc) — ABNORMAL HIGH (ref <130)
TRIGLYCERIDES: 231 mg/dL — AB (ref <150)
Total CHOL/HDL Ratio: 4.1 (calc) (ref <5.0)

## 2017-11-18 LAB — COMPLETE METABOLIC PANEL WITH GFR
AG Ratio: 1.7 (calc) (ref 1.0–2.5)
ALBUMIN MSPROF: 3.9 g/dL (ref 3.6–5.1)
ALT: 10 U/L (ref 6–29)
AST: 14 U/L (ref 10–35)
Alkaline phosphatase (APISO): 79 U/L (ref 33–130)
BUN: 12 mg/dL (ref 7–25)
CHLORIDE: 104 mmol/L (ref 98–110)
CO2: 29 mmol/L (ref 20–32)
CREATININE: 0.95 mg/dL (ref 0.50–1.05)
Calcium: 9.2 mg/dL (ref 8.6–10.4)
GFR, EST NON AFRICAN AMERICAN: 67 mL/min/{1.73_m2} (ref >=60)
GFR, Est African American: 78 mL/min/{1.73_m2} (ref >=60)
Globulin: 2.3 g/dL (calc) (ref 1.9–3.7)
Glucose, Bld: 99 mg/dL (ref 65–99)
POTASSIUM: 4.2 mmol/L (ref 3.5–5.3)
Sodium: 139 mmol/L (ref 135–146)
TOTAL PROTEIN: 6.2 g/dL (ref 6.1–8.1)
Total Bilirubin: 0.4 mg/dL (ref 0.2–1.2)

## 2017-11-20 ENCOUNTER — Encounter: Payer: Self-pay | Admitting: Family Medicine

## 2017-11-20 DIAGNOSIS — E785 Hyperlipidemia, unspecified: Secondary | ICD-10-CM | POA: Insufficient documentation

## 2017-11-24 ENCOUNTER — Telehealth: Payer: Self-pay | Admitting: Family Medicine

## 2017-11-24 NOTE — Telephone Encounter (Signed)
Reviewed results and physician's note with patient. Also discussed a low cholesterol diet and the importance of daily exercise.

## 2017-11-28 ENCOUNTER — Other Ambulatory Visit
Admission: RE | Admit: 2017-11-28 | Discharge: 2017-11-28 | Disposition: A | Discharge status: Home / Self Care | Payer: Managed Care, Other (non HMO) | Source: Ambulatory Visit | Attending: Psychiatry | Admitting: Psychiatry

## 2017-11-28 DIAGNOSIS — Z79899 Other long term (current) drug therapy: Secondary | ICD-10-CM | POA: Diagnosis present

## 2017-11-28 DIAGNOSIS — F259 Schizoaffective disorder, unspecified: Secondary | ICD-10-CM | POA: Diagnosis not present

## 2017-11-28 LAB — CBC WITH DIFFERENTIAL/PLATELET
BASOS ABS: 0.1 10*3/uL (ref 0–0.1)
Basophils Relative: 1 %
EOS PCT: 4 %
Eosinophils Absolute: 0.4 10*3/uL (ref 0–0.7)
HCT: 38.7 % (ref 35.0–47.0)
HEMOGLOBIN: 12.8 g/dL (ref 12.0–16.0)
LYMPHS PCT: 21 %
Lymphs Abs: 1.8 10*3/uL (ref 1.0–3.6)
MCH: 30.3 pg (ref 26.0–34.0)
MCHC: 33.1 g/dL (ref 32.0–36.0)
MCV: 91.6 fL (ref 80.0–100.0)
Monocytes Absolute: 0.6 10*3/uL (ref 0.2–0.9)
Monocytes Relative: 6 %
NEUTROS ABS: 5.8 10*3/uL (ref 1.4–6.5)
NEUTROS PCT: 68 %
PLATELETS: 275 10*3/uL (ref 150–440)
RBC: 4.22 MIL/uL (ref 3.80–5.20)
RDW: 12.7 % (ref 11.5–14.5)
WBC: 8.7 10*3/uL (ref 3.6–11.0)

## 2017-12-19 ENCOUNTER — Other Ambulatory Visit
Admission: RE | Admit: 2017-12-19 | Discharge: 2017-12-19 | Disposition: A | Discharge status: Home / Self Care | Payer: Managed Care, Other (non HMO) | Source: Ambulatory Visit | Attending: Psychiatry | Admitting: Psychiatry

## 2017-12-19 DIAGNOSIS — Z79899 Other long term (current) drug therapy: Secondary | ICD-10-CM | POA: Diagnosis present

## 2017-12-19 DIAGNOSIS — F28 Other psychotic disorder not due to a substance or known physiological condition: Secondary | ICD-10-CM | POA: Diagnosis present

## 2017-12-19 LAB — CBC WITH DIFFERENTIAL/PLATELET
BASOS ABS: 0 10*3/uL (ref 0–0.1)
BASOS PCT: 1 %
Eosinophils Absolute: 0.3 10*3/uL (ref 0–0.7)
Eosinophils Relative: 5 %
HCT: 38.9 % (ref 35.0–47.0)
HEMOGLOBIN: 12.8 g/dL (ref 12.0–16.0)
LYMPHS PCT: 24 %
Lymphs Abs: 1.6 10*3/uL (ref 1.0–3.6)
MCH: 30 pg (ref 26.0–34.0)
MCHC: 32.8 g/dL (ref 32.0–36.0)
MCV: 91.5 fL (ref 80.0–100.0)
MONOS PCT: 7 %
Monocytes Absolute: 0.5 10*3/uL (ref 0.2–0.9)
NEUTROS ABS: 4.5 10*3/uL (ref 1.4–6.5)
NEUTROS PCT: 63 %
Platelets: 253 10*3/uL (ref 150–440)
RBC: 4.25 MIL/uL (ref 3.80–5.20)
RDW: 12.5 % (ref 11.5–14.5)
WBC: 7 10*3/uL (ref 3.6–11.0)

## 2017-12-20 ENCOUNTER — Other Ambulatory Visit: Payer: Self-pay | Admitting: Family Medicine

## 2018-01-26 ENCOUNTER — Other Ambulatory Visit
Admission: RE | Admit: 2018-01-26 | Discharge: 2018-01-26 | Disposition: A | Discharge status: Home / Self Care | Payer: Managed Care, Other (non HMO) | Source: Ambulatory Visit | Attending: Psychiatry | Admitting: Psychiatry

## 2018-01-26 DIAGNOSIS — F29 Unspecified psychosis not due to a substance or known physiological condition: Secondary | ICD-10-CM | POA: Insufficient documentation

## 2018-01-26 DIAGNOSIS — Z79899 Other long term (current) drug therapy: Secondary | ICD-10-CM | POA: Diagnosis present

## 2018-01-26 LAB — CBC WITH DIFFERENTIAL/PLATELET
Basophils Absolute: 0 10*3/uL (ref 0–0.1)
Basophils Relative: 1 %
EOS PCT: 4 %
Eosinophils Absolute: 0.3 10*3/uL (ref 0–0.7)
HCT: 39.1 % (ref 35.0–47.0)
Hemoglobin: 12.9 g/dL (ref 12.0–16.0)
LYMPHS PCT: 25 %
Lymphs Abs: 1.8 10*3/uL (ref 1.0–3.6)
MCH: 30 pg (ref 26.0–34.0)
MCHC: 33.1 g/dL (ref 32.0–36.0)
MCV: 90.6 fL (ref 80.0–100.0)
MONO ABS: 0.4 10*3/uL (ref 0.2–0.9)
MONOS PCT: 6 %
Neutro Abs: 4.6 10*3/uL (ref 1.4–6.5)
Neutrophils Relative %: 64 %
PLATELETS: 281 10*3/uL (ref 150–440)
RBC: 4.31 MIL/uL (ref 3.80–5.20)
RDW: 12.9 % (ref 11.5–14.5)
WBC: 7.1 10*3/uL (ref 3.6–11.0)

## 2018-02-20 ENCOUNTER — Other Ambulatory Visit
Admission: RE | Admit: 2018-02-20 | Discharge: 2018-02-20 | Disposition: A | Discharge status: Home / Self Care | Payer: Managed Care, Other (non HMO) | Source: Ambulatory Visit | Attending: Psychiatry | Admitting: Psychiatry

## 2018-02-20 DIAGNOSIS — F250 Schizoaffective disorder, bipolar type: Secondary | ICD-10-CM | POA: Diagnosis not present

## 2018-02-20 DIAGNOSIS — Z79899 Other long term (current) drug therapy: Secondary | ICD-10-CM | POA: Insufficient documentation

## 2018-02-20 LAB — CBC WITH DIFFERENTIAL/PLATELET
Basophils Absolute: 0.1 10*3/uL (ref 0–0.1)
Basophils Relative: 1 %
EOS ABS: 0.2 10*3/uL (ref 0–0.7)
Eosinophils Relative: 3 %
HCT: 38.7 % (ref 35.0–47.0)
HEMOGLOBIN: 12.6 g/dL (ref 12.0–16.0)
LYMPHS ABS: 1.9 10*3/uL (ref 1.0–3.6)
Lymphocytes Relative: 22 %
MCH: 29.9 pg (ref 26.0–34.0)
MCHC: 32.6 g/dL (ref 32.0–36.0)
MCV: 91.5 fL (ref 80.0–100.0)
Monocytes Absolute: 0.6 10*3/uL (ref 0.2–0.9)
Monocytes Relative: 7 %
NEUTROS PCT: 67 %
Neutro Abs: 5.9 10*3/uL (ref 1.4–6.5)
Platelets: 281 10*3/uL (ref 150–440)
RBC: 4.23 MIL/uL (ref 3.80–5.20)
RDW: 13 % (ref 11.5–14.5)
WBC: 8.6 10*3/uL (ref 3.6–11.0)

## 2018-03-17 ENCOUNTER — Other Ambulatory Visit: Payer: Self-pay | Admitting: Family Medicine

## 2018-03-18 ENCOUNTER — Other Ambulatory Visit
Admission: RE | Admit: 2018-03-18 | Discharge: 2018-03-18 | Disposition: A | Discharge status: Home / Self Care | Payer: Managed Care, Other (non HMO) | Source: Ambulatory Visit | Attending: Psychiatry | Admitting: Psychiatry

## 2018-03-18 DIAGNOSIS — Z79899 Other long term (current) drug therapy: Secondary | ICD-10-CM | POA: Diagnosis not present

## 2018-03-18 LAB — CBC WITH DIFFERENTIAL/PLATELET
BASOS ABS: 0 10*3/uL (ref 0–0.1)
Basophils Relative: 1 %
Eosinophils Absolute: 0.3 10*3/uL (ref 0–0.7)
Eosinophils Relative: 4 %
HEMATOCRIT: 39.5 % (ref 35.0–47.0)
HEMOGLOBIN: 13.5 g/dL (ref 12.0–16.0)
LYMPHS PCT: 19 %
Lymphs Abs: 1.4 10*3/uL (ref 1.0–3.6)
MCH: 31.3 pg (ref 26.0–34.0)
MCHC: 34.1 g/dL (ref 32.0–36.0)
MCV: 91.8 fL (ref 80.0–100.0)
MONO ABS: 0.4 10*3/uL (ref 0.2–0.9)
Monocytes Relative: 5 %
NEUTROS ABS: 5.2 10*3/uL (ref 1.4–6.5)
NEUTROS PCT: 71 %
Platelets: 270 10*3/uL (ref 150–440)
RBC: 4.3 MIL/uL (ref 3.80–5.20)
RDW: 12.9 % (ref 11.5–14.5)
WBC: 7.3 10*3/uL (ref 3.6–11.0)

## 2018-04-21 ENCOUNTER — Other Ambulatory Visit
Admission: RE | Admit: 2018-04-21 | Discharge: 2018-04-21 | Disposition: A | Discharge status: Home / Self Care | Payer: Managed Care, Other (non HMO) | Source: Ambulatory Visit | Attending: Psychiatry | Admitting: Psychiatry

## 2018-04-21 DIAGNOSIS — Z79899 Other long term (current) drug therapy: Secondary | ICD-10-CM | POA: Diagnosis present

## 2018-04-21 DIAGNOSIS — F250 Schizoaffective disorder, bipolar type: Secondary | ICD-10-CM | POA: Insufficient documentation

## 2018-04-21 LAB — CBC WITH DIFFERENTIAL/PLATELET
BASOS PCT: 1 %
Basophils Absolute: 0.1 10*3/uL (ref 0–0.1)
Eosinophils Absolute: 0.3 10*3/uL (ref 0–0.7)
Eosinophils Relative: 3 %
HEMATOCRIT: 36.3 % (ref 35.0–47.0)
HEMOGLOBIN: 12.3 g/dL (ref 12.0–16.0)
LYMPHS ABS: 1.7 10*3/uL (ref 1.0–3.6)
Lymphocytes Relative: 19 %
MCH: 30.9 pg (ref 26.0–34.0)
MCHC: 33.8 g/dL (ref 32.0–36.0)
MCV: 91.5 fL (ref 80.0–100.0)
MONO ABS: 0.3 10*3/uL (ref 0.2–0.9)
MONOS PCT: 4 %
NEUTROS ABS: 6.8 10*3/uL — AB (ref 1.4–6.5)
NEUTROS PCT: 73 %
Platelets: 235 10*3/uL (ref 150–440)
RBC: 3.97 MIL/uL (ref 3.80–5.20)
RDW: 12.8 % (ref 11.5–14.5)
WBC: 9.2 10*3/uL (ref 3.6–11.0)

## 2018-05-14 ENCOUNTER — Other Ambulatory Visit
Admission: RE | Admit: 2018-05-14 | Discharge: 2018-05-14 | Disposition: A | Discharge status: Home / Self Care | Payer: Managed Care, Other (non HMO) | Source: Ambulatory Visit | Attending: Psychiatry | Admitting: Psychiatry

## 2018-05-14 DIAGNOSIS — F259 Schizoaffective disorder, unspecified: Secondary | ICD-10-CM | POA: Insufficient documentation

## 2018-05-14 DIAGNOSIS — Z79899 Other long term (current) drug therapy: Secondary | ICD-10-CM | POA: Diagnosis present

## 2018-05-14 DIAGNOSIS — Z5181 Encounter for therapeutic drug level monitoring: Secondary | ICD-10-CM | POA: Diagnosis not present

## 2018-05-14 LAB — CBC WITH DIFFERENTIAL/PLATELET
Basophils Absolute: 0.1 10*3/uL (ref 0–0.1)
Basophils Relative: 1 %
Eosinophils Absolute: 0.4 10*3/uL (ref 0–0.7)
Eosinophils Relative: 5 %
HEMATOCRIT: 37.3 % (ref 35.0–47.0)
HEMOGLOBIN: 12.4 g/dL (ref 12.0–16.0)
LYMPHS ABS: 1.7 10*3/uL (ref 1.0–3.6)
LYMPHS PCT: 22 %
MCH: 30.6 pg (ref 26.0–34.0)
MCHC: 33.2 g/dL (ref 32.0–36.0)
MCV: 92.2 fL (ref 80.0–100.0)
Monocytes Absolute: 0.4 10*3/uL (ref 0.2–0.9)
Monocytes Relative: 5 %
NEUTROS ABS: 5.3 10*3/uL (ref 1.4–6.5)
NEUTROS PCT: 67 %
Platelets: 264 10*3/uL (ref 150–440)
RBC: 4.05 MIL/uL (ref 3.80–5.20)
RDW: 13.3 % (ref 11.5–14.5)
WBC: 7.7 10*3/uL (ref 3.6–11.0)

## 2018-05-18 ENCOUNTER — Ambulatory Visit (INDEPENDENT_AMBULATORY_CARE_PROVIDER_SITE_OTHER): Payer: Managed Care, Other (non HMO) | Admitting: Family Medicine

## 2018-05-18 ENCOUNTER — Encounter: Payer: Self-pay | Admitting: Family Medicine

## 2018-05-18 ENCOUNTER — Other Ambulatory Visit
Admission: RE | Admit: 2018-05-18 | Discharge: 2018-05-18 | Disposition: A | Discharge status: Home / Self Care | Payer: Managed Care, Other (non HMO) | Source: Ambulatory Visit | Attending: Family Medicine | Admitting: Family Medicine

## 2018-05-18 VITALS — BP 114/66 | HR 94 | Temp 98.0°F | Resp 16 | Ht 66.0 in | Wt 144.7 lb

## 2018-05-18 DIAGNOSIS — E781 Pure hyperglyceridemia: Secondary | ICD-10-CM

## 2018-05-18 DIAGNOSIS — E559 Vitamin D deficiency, unspecified: Secondary | ICD-10-CM | POA: Insufficient documentation

## 2018-05-18 DIAGNOSIS — K5909 Other constipation: Secondary | ICD-10-CM | POA: Diagnosis not present

## 2018-05-18 DIAGNOSIS — Z79899 Other long term (current) drug therapy: Secondary | ICD-10-CM | POA: Insufficient documentation

## 2018-05-18 DIAGNOSIS — R000 Tachycardia, unspecified: Secondary | ICD-10-CM | POA: Diagnosis not present

## 2018-05-18 DIAGNOSIS — R739 Hyperglycemia, unspecified: Secondary | ICD-10-CM

## 2018-05-18 DIAGNOSIS — K219 Gastro-esophageal reflux disease without esophagitis: Secondary | ICD-10-CM

## 2018-05-18 DIAGNOSIS — F250 Schizoaffective disorder, bipolar type: Secondary | ICD-10-CM

## 2018-05-18 LAB — COMPREHENSIVE METABOLIC PANEL
ALK PHOS: 74 U/L (ref 38–126)
ALT: 19 U/L (ref 14–54)
ANION GAP: 9 (ref 5–15)
AST: 24 U/L (ref 15–41)
Albumin: 4.2 g/dL (ref 3.5–5.0)
BUN: 14 mg/dL (ref 6–20)
CALCIUM: 9.1 mg/dL (ref 8.9–10.3)
CO2: 25 mmol/L (ref 22–32)
CREATININE: 0.89 mg/dL (ref 0.44–1.00)
Chloride: 105 mmol/L (ref 101–111)
Glucose, Bld: 105 mg/dL — ABNORMAL HIGH (ref 65–99)
Potassium: 4.1 mmol/L (ref 3.5–5.1)
SODIUM: 139 mmol/L (ref 135–145)
Total Bilirubin: 0.6 mg/dL (ref 0.3–1.2)
Total Protein: 6.9 g/dL (ref 6.5–8.1)

## 2018-05-18 LAB — LIPID PANEL
CHOL/HDL RATIO: 4.3 ratio
Cholesterol: 265 mg/dL — ABNORMAL HIGH (ref 0–200)
HDL: 62 mg/dL (ref >40)
LDL Cholesterol: 163 mg/dL — ABNORMAL HIGH (ref 0–99)
TRIGLYCERIDES: 201 mg/dL — AB (ref <150)
VLDL: 40 mg/dL (ref 0–40)

## 2018-05-18 LAB — HEMOGLOBIN A1C
HEMOGLOBIN A1C: 4.9 % (ref 4.8–5.6)
MEAN PLASMA GLUCOSE: 93.93 mg/dL

## 2018-05-18 MED ORDER — LINACLOTIDE 145 MCG PO CAPS: ORAL_CAPSULE | ORAL | 5 refills | Status: DC

## 2018-05-18 MED ORDER — METOPROLOL TARTRATE 25 MG PO TABS: 12.5000 mg | ORAL_TABLET | Freq: Two times a day (BID) | ORAL | 1 refills | Status: DC

## 2018-05-18 MED ORDER — VITAMIN D (ERGOCALCIFEROL) 1.25 MG (50000 UNIT) PO CAPS: 50000.0000 [IU] | ORAL_CAPSULE | ORAL | 0 refills | Status: DC

## 2018-05-18 NOTE — Progress Notes (Signed)
Name: Carla Thompson   MRN: [id number]    DOB: 1961-09-21   Date:05/18/2018       Progress Note  Subjective  Chief Complaint  Chief Complaint  Patient presents with  . Gastroesophageal Reflux  . Hyperlipidemia  . Schizophrenia  . Medication Refill    HPI  Chronic constipation: she is back on Linzess. She has bowel movements every 3-4 days. Bristol scale is type 3 or 4, last one was a 4  , not straining, still has occasional soiling.  Stress and Urge incontinence: she has noticed occasional urgency l it has been chronic, states nocturia has improved, no longer having to void every night. Trying to drink before going to bed    Schizophrenia: Seeing psychiatrist . No longer has hallucinations.She has been cooking and cleaning again. Able to have friends over. Husband is here with her and thinks she is back to baseline. No paranoia or hallucinations.She does not drive because of fear or getting in an accident secondary to medication that she needs to take. They just bought a house and she is very happy about it.   GERD: taking prn tums and is doing well.   Tachycardia: doing well on Lopressor half pill twice daily, she denies SOB or chest pain, denies palpitation - no side effects of medication Unchanged   Hypertriglyceridemia: we will recheck labs, she takes medications that can cause levels to go up    Patient Active Problem List   Diagnosis Date Noted  . Hyperlipidemia 11/20/2017  . GERD without esophagitis 02/21/2017  . Tachycardia 02/20/2016  . RLS (restless legs syndrome) 12/21/2015  . IBS (irritable bowel syndrome) 12/21/2015  . Other long term (current) drug therapy 12/08/2015  . Chronic constipation 12/08/2015  . Insomnia, persistent 12/08/2015  . Dermatitis, eczematoid 12/08/2015  . Gravida 2 para 2 12/08/2015  . Hypertriglyceridemia 12/08/2015  . Metallic taste 43/32/9518  . 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 Schizoaffective disorder, bipolar type ([place])  12/08/2015    Past Surgical History:  Procedure Laterality Date  . ANUS SURGERY  2000   [place]  . TUBAL LIGATION  12/02/1994    Family History  Problem Relation Age of Onset  . Mental illness Sister        Schizophrenia and bipolar  . Cancer Maternal Grandmother        Colon  . Mental illness Paternal Grandmother        Schizophrenia  . Breast cancer Neg Hx     Social History   Socioeconomic History  . Marital status: Married    Spouse name: Not on file  . Number of children: Not on file  . Years of education: Not on file  . Highest education level: Not on file  Occupational History  . Not on file  Social Needs  . Financial resource strain: Not on file  . Food insecurity:    Worry: Not on file    Inability: Not on file  . Transportation needs:    Medical: Not on file    Non-medical: Not on file  Tobacco Use  . Smoking status: Never Smoker  . Smokeless tobacco: Never Used  Substance and Sexual Activity  . Alcohol use: No    Alcohol/week: 0.0 oz  . Drug use: No  . Sexual activity: Not Currently  Lifestyle  . Physical activity:    Days per week: Not on file    Minutes per session: Not on file  . Stress: Not on file  Relationships  .  Social connections:    Talks on phone: Not on file    Gets together: Not on file    Attends religious service: Not on file    Active member of club or organization: Not on file    Attends meetings of clubs or organizations: Not on file    Relationship status: Not on file  . Intimate partner violence:    Fear of current or ex partner: Not on file    Emotionally abused: Not on file    Physically abused: Not on file    Forced sexual activity: Not on file  Other Topics Concern  . Not on file  Social History Narrative  . Not on file     Current Outpatient Medications:  .  ASPIRIN LOW DOSE 81 MG EC tablet, TAKE 1 TABLET(81 MG) BY MOUTH DAILY, Disp: 30 tablet, Rfl: 11 .  B Complex-C (SUPER B COMPLEX PO), Take 1 tablet by  mouth daily., Disp: , Rfl:  .  CINNAMON PO, Take by mouth., Disp: , Rfl:  .  clonazePAM (KLONOPIN) 0.5 MG tablet, Take 0.5 mg by mouth at bedtime., Disp: , Rfl:  .  cloZAPine (CLOZARIL) 100 MG tablet, Take 100-300 mg by mouth 2 (two) times daily. Pt takes one tablet in the morning and three tablets at bedtime. , Disp: , Rfl:  .  docusate sodium (STOOL SOFTENER) 100 MG capsule, Take 100 mg by mouth 2 (two) times daily as needed for mild constipation. , Disp: , Rfl:  .  famotidine (PEPCID) 20 MG tablet, Take 20 mg by mouth 2 (two) times daily., Disp: , Rfl:  .  linaclotide (LINZESS) 145 MCG CAPS capsule, TAKE 1 CAPSULE(145 MCG) BY MOUTH DAILY, Disp: 30 capsule, Rfl: 5 .  lithium carbonate 300 MG capsule, TK 2 CS PO QPM, Disp: , Rfl: 0 .  metoprolol tartrate (LOPRESSOR) 25 MG tablet, Take 0.5 tablets (12.5 mg total) by mouth 2 (two) times daily., Disp: 90 tablet, Rfl: 1 .  vitamin C (ASCORBIC ACID) 500 MG tablet, Take 500 mg by mouth daily., Disp: , Rfl:  .  Vitamin D, Ergocalciferol, (DRISDOL) 50000 units CAPS capsule, Take 1 capsule (50,000 Units total) by mouth every 7 (seven) days., Disp: 12 capsule, Rfl: 0 .  vitamin E 1000 UNIT capsule, Take 1,000 Units by mouth daily. , Disp: , Rfl:   Allergies  Allergen Reactions  . Escitalopram Hives  . Levsin [Hyoscyamine Sulfate] Other (See Comments)    Reaction:  Patient took medication and felt really spacey     ROS  Constitutional: Negative for fever or weight change.  Respiratory: Negative for cough and shortness of breath.   Cardiovascular: Negative for chest pain or palpitations.  Gastrointestinal: Negative for abdominal pain, no bowel changes.  Musculoskeletal: Negative for gait problem or joint swelling.  Skin: Negative for rash.  Neurological: Negative for dizziness or headache.  No other specific complaints in a complete review of systems (except as listed in HPI above).  Objective  Vitals:   05/18/18 1138  BP: 114/66  Pulse: 94   Resp: 16  Temp: 98 F (36.7 C)  TempSrc: Oral  SpO2: 97%  Weight: 144 lb 11.2 oz (65.6 kg)  Height: 5\' 6"  (1.676 m)    Body mass index is 23.36 kg/m.  Physical Exam  Constitutional: Patient appears well-developed and well-nourished.  No distress.  HEENT: head atraumatic, normocephalic, pupils equal and reactive to light,neck supple, throat within normal limits Cardiovascular: Normal rate, regular rhythm and normal heart  sounds.  No murmur heard. No BLE edema. Pulmonary/Chest: Effort normal and breath sounds normal. No respiratory distress. Abdominal: Soft.  There is no tenderness. Psychiatric: Patient has a normal mood and affect. behavior is normal. Judgment and thought content normal.   Recent Results (from the past 2160 hour(s))  CBC with Differential/Platelet     Status: None   Collection Time: 02/20/18 11:48 AM  Result Value Ref Range   WBC 8.6 3.6 - 11.0 K/uL   RBC 4.23 3.80 - 5.20 MIL/uL   Hemoglobin 12.6 12.0 - 16.0 g/dL   HCT 38.7 35.0 - 47.0 %   MCV 91.5 80.0 - 100.0 fL   MCH 29.9 26.0 - 34.0 pg   MCHC 32.6 32.0 - 36.0 g/dL   RDW 13.0 11.5 - 14.5 %   Platelets 281 150 - 440 K/uL   Neutrophils Relative % 67 %   Neutro Abs 5.9 1.4 - 6.5 K/uL   Lymphocytes Relative 22 %   Lymphs Abs 1.9 1.0 - 3.6 K/uL   Monocytes Relative 7 %   Monocytes Absolute 0.6 0.2 - 0.9 K/uL   Eosinophils Relative 3 %   Eosinophils Absolute 0.2 0 - 0.7 K/uL   Basophils Relative 1 %   Basophils Absolute 0.1 0 - 0.1 K/uL    Comment: Performed at [facility], [address] [place], [place] [address]  CBC with Differential/Platelet     Status: None   Collection Time: 03/18/18 11:28 AM  Result Value Ref Range   WBC 7.3 3.6 - 11.0 K/uL   RBC 4.30 3.80 - 5.20 MIL/uL   Hemoglobin 13.5 12.0 - 16.0 g/dL   HCT 39.5 35.0 - 47.0 %   MCV 91.8 80.0 - 100.0 fL   MCH 31.3 26.0 - 34.0 pg   MCHC 34.1 32.0 - 36.0 g/dL   RDW 12.9 11.5 - 14.5 %   Platelets 270 150 - 440 K/uL    Neutrophils Relative % 71 %   Neutro Abs 5.2 1.4 - 6.5 K/uL   Lymphocytes Relative 19 %   Lymphs Abs 1.4 1.0 - 3.6 K/uL   Monocytes Relative 5 %   Monocytes Absolute 0.4 0.2 - 0.9 K/uL   Eosinophils Relative 4 %   Eosinophils Absolute 0.3 0 - 0.7 K/uL   Basophils Relative 1 %   Basophils Absolute 0.0 0 - 0.1 K/uL    Comment: Performed at [facility], [address] [place], [place] [address]  CBC with Differential/Platelet     Status: Abnormal   Collection Time: 04/21/18 12:55 PM  Result Value Ref Range   WBC 9.2 3.6 - 11.0 K/uL   RBC 3.97 3.80 - 5.20 MIL/uL   Hemoglobin 12.3 12.0 - 16.0 g/dL   HCT 36.3 35.0 - 47.0 %   MCV 91.5 80.0 - 100.0 fL   MCH 30.9 26.0 - 34.0 pg   MCHC 33.8 32.0 - 36.0 g/dL   RDW 12.8 11.5 - 14.5 %   Platelets 235 150 - 440 K/uL   Neutrophils Relative % 73 %   Neutro Abs 6.8 (H) 1.4 - 6.5 K/uL   Lymphocytes Relative 19 %   Lymphs Abs 1.7 1.0 - 3.6 K/uL   Monocytes Relative 4 %   Monocytes Absolute 0.3 0.2 - 0.9 K/uL   Eosinophils Relative 3 %   Eosinophils Absolute 0.3 0 - 0.7 K/uL   Basophils Relative 1 %   Basophils Absolute 0.1 0 - 0.1 K/uL    Comment: Performed at  [facility], [place], [place] [address]  CBC with Differential/Platelet     Status: None   Collection Time: 05/14/18  9:22 AM  Result Value Ref Range   WBC 7.7 3.6 - 11.0 K/uL   RBC 4.05 3.80 - 5.20 MIL/uL   Hemoglobin 12.4 12.0 - 16.0 g/dL   HCT 37.3 35.0 - 47.0 %   MCV 92.2 80.0 - 100.0 fL   MCH 30.6 26.0 - 34.0 pg   MCHC 33.2 32.0 - 36.0 g/dL   RDW 13.3 11.5 - 14.5 %   Platelets 264 150 - 440 K/uL   Neutrophils Relative % 67 %   Neutro Abs 5.3 1.4 - 6.5 K/uL   Lymphocytes Relative 22 %   Lymphs Abs 1.7 1.0 - 3.6 K/uL   Monocytes Relative 5 %   Monocytes Absolute 0.4 0.2 - 0.9 K/uL   Eosinophils Relative 5 %   Eosinophils Absolute 0.4 0 - 0.7 K/uL   Basophils Relative 1 %   Basophils Absolute 0.1 0 - 0.1 K/uL    Comment: Performed at  [facility], [place], [place] [address]     PHQ2/9: Depression screen [facility] 2/9 05/18/2018 08/25/2017 05/22/2016 02/20/2016 01/23/2016  Decreased Interest 0 0 0 0 0  Down, Depressed, Hopeless 0 0 1 0 2  PHQ - 2 Score 0 0 1 0 2  Altered sleeping 0 - - - 2  Tired, decreased energy 1 - - - 2  Change in appetite 0 - - - 0  Feeling bad or failure about yourself  0 - - - 1  Trouble concentrating 0 - - - 0  Moving slowly or fidgety/restless 1 - - - 0  Suicidal thoughts 0 - - - 0  PHQ-9 Score 2 - - - 7  Difficult doing work/chores - - - - -    Fall Risk: Fall Risk  05/18/2018 11/17/2017 08/25/2017 08/23/2016 05/22/2016  Falls in the past year? No No No No No  Number falls in past yr: - - - - -  Injury with Fall? - - - - -  Risk for fall due to : - - History of fall(s) - -     Functional Status Survey: Is the patient deaf or have difficulty hearing?: No Does the patient have difficulty seeing, even when wearing glasses/contacts?: Yes Does the patient have difficulty concentrating, remembering, or making decisions?: No Does the patient have difficulty walking or climbing stairs?: No Does the patient have difficulty dressing or bathing?: No Does the patient have difficulty doing errands alone such as visiting a doctor's office or shopping?: No    Assessment & Plan  1. Chronic constipation  - linaclotide (LINZESS) 145 MCG CAPS capsule; TAKE 1 CAPSULE(145 MCG) BY MOUTH DAILY  Dispense: 30 capsule; Refill: 5  2. Tachycardia  - metoprolol tartrate (LOPRESSOR) 25 MG tablet; Take 0.5 tablets (12.5 mg total) by mouth 2 (two) times daily.  Dispense: 90 tablet; Refill: 1  3. GERD without esophagitis  Stable   4. Schizoaffective disorder, bipolar type ([place])  Continue follow up with psychiatrist   5. Vitamin D deficiency  - Vitamin D, Ergocalciferol, (DRISDOL) 50000 units CAPS capsule; Take 1 capsule (50,000 Units total) by mouth every 7 (seven) days.  Dispense:  12 capsule; Refill: 0 -vitamin d - VITAMIN D 25 Hydroxy (Vit-D Deficiency, Fractures)  6. Hyperglycemia  - Hemoglobin A1c  7. Hypertriglyceridemia  - Lipid panel  8. Long-term use of high-risk medication  -  COMPLETE METABOLIC PANEL WITH GFR

## 2018-05-19 LAB — VITAMIN D 25 HYDROXY (VIT D DEFICIENCY, FRACTURES): VIT D 25 HYDROXY: 54.7 ng/mL (ref 30.0–100.0)

## 2018-05-21 ENCOUNTER — Telehealth: Payer: Self-pay

## 2018-05-21 NOTE — Patient Instructions (Signed)
Fat and Cholesterol Restricted Diet Getting too much fat and cholesterol in your diet may cause health problems. Following this diet helps keep your fat and cholesterol at normal levels. This can keep you from getting sick. What types of fat should I choose?  Choose monosaturated and polyunsaturated fats. These are found in foods such as olive oil, canola oil, flaxseeds, walnuts, almonds, and seeds.  Eat more omega-3 fats. Good choices include salmon, mackerel, sardines, tuna, flaxseed oil, and ground flaxseeds.  Limit saturated fats. These are in animal products such as meats, butter, and cream. They can also be in plant products such as palm oil, palm kernel oil, and coconut oil.  Avoid foods with partially hydrogenated oils in them. These contain trans fats. Examples of foods that have trans fats are stick margarine, some tub margarines, cookies, crackers, and other baked goods. What general guidelines do I need to follow?  Check food labels. Look for the words "trans fat" and "saturated fat."  When preparing a meal: ? Fill half of your plate with vegetables and green salads. ? Fill one fourth of your plate with whole grains. Look for the word "whole" as the first word in the ingredient list. ? Fill one fourth of your plate with lean protein foods.  Eat more foods that have fiber, like apples, carrots, beans, peas, and barley.  Eat more home-cooked foods. Eat less at restaurants and buffets.  Limit or avoid alcohol.  Limit foods high in starch and sugar.  Limit fried foods.  Cook foods without frying them. Baking, boiling, grilling, and broiling are all great options.  Lose weight if you are overweight. Losing even a small amount of weight can help your overall health. It can also help prevent diseases such as diabetes and heart disease. What foods can I eat? Grains Whole grains, such as whole wheat or whole grain breads, crackers, cereals, and pasta. Unsweetened oatmeal,  bulgur, barley, quinoa, or brown rice. Corn or whole wheat flour tortillas. Vegetables Fresh or frozen vegetables (raw, steamed, roasted, or grilled). Green salads. Fruits All fresh, canned (in natural juice), or frozen fruits. Meat and Other Protein Products Ground beef (85% or leaner), grass-fed beef, or beef trimmed of fat. Skinless chicken or turkey. Ground chicken or turkey. Pork trimmed of fat. All fish and seafood. Eggs. Dried beans, peas, or lentils. Unsalted nuts or seeds. Unsalted canned or dry beans. Dairy Low-fat dairy products, such as skim or 1% milk, 2% or reduced-fat cheeses, low-fat ricotta or cottage cheese, or plain low-fat yogurt. Fats and Oils Tub margarines without trans fats. Light or reduced-fat mayonnaise and salad dressings. Avocado. Olive, canola, sesame, or safflower oils. Natural peanut or almond butter (choose ones without added sugar and oil). The items listed above may not be a complete list of recommended foods or beverages. Contact your dietitian for more options. What foods are not recommended? Grains White bread. White pasta. White rice. Cornbread. Bagels, pastries, and croissants. Crackers that contain trans fat. Vegetables White potatoes. Corn. Creamed or fried vegetables. Vegetables in a cheese sauce. Fruits Dried fruits. Canned fruit in light or heavy syrup. Fruit juice. Meat and Other Protein Products Fatty cuts of meat. Ribs, chicken wings, bacon, sausage, bologna, salami, chitterlings, fatback, hot dogs, bratwurst, and packaged luncheon meats. Liver and organ meats. Dairy Whole or 2% milk, cream, half-and-half, and cream cheese. Whole milk cheeses. Whole-fat or sweetened yogurt. Full-fat cheeses. Nondairy creamers and whipped toppings. Processed cheese, cheese spreads, or cheese curds. Sweets and Desserts Corn   syrup, sugars, honey, and molasses. Candy. Jam and jelly. Syrup. Sweetened cereals. Cookies, pies, cakes, donuts, muffins, and ice  cream. Fats and Oils Butter, stick margarine, lard, shortening, ghee, or bacon fat. Coconut, palm kernel, or palm oils. Beverages Alcohol. Sweetened drinks (such as sodas, lemonade, and fruit drinks or punches). The items listed above may not be a complete list of foods and beverages to avoid. Contact your dietitian for more information. This information is not intended to replace advice given to you by your health care provider. Make sure you discuss any questions you have with your health care provider. Document Released: 05/19/2012 Document Revised: 07/25/2016 Document Reviewed: 02/17/2014 Elsevier Interactive Patient Education  2018 Elsevier Inc.  

## 2018-05-21 NOTE — Telephone Encounter (Signed)
Erroneous Encounter

## 2018-06-15 ENCOUNTER — Other Ambulatory Visit
Admission: RE | Admit: 2018-06-15 | Payer: Managed Care, Other (non HMO) | Source: Ambulatory Visit | Admitting: *Deleted

## 2018-06-16 ENCOUNTER — Other Ambulatory Visit
Admission: RE | Admit: 2018-06-16 | Discharge: 2018-06-16 | Disposition: A | Discharge status: Home / Self Care | Payer: Managed Care, Other (non HMO) | Source: Ambulatory Visit | Attending: Psychiatry | Admitting: Psychiatry

## 2018-06-16 DIAGNOSIS — F259 Schizoaffective disorder, unspecified: Secondary | ICD-10-CM | POA: Insufficient documentation

## 2018-06-16 DIAGNOSIS — Z79899 Other long term (current) drug therapy: Secondary | ICD-10-CM | POA: Insufficient documentation

## 2018-06-16 LAB — CBC WITH DIFFERENTIAL/PLATELET
BASOS ABS: 0.1 10*3/uL (ref 0–0.1)
BASOS PCT: 1 %
EOS ABS: 0.3 10*3/uL (ref 0–0.7)
Eosinophils Relative: 3 %
HCT: 36.7 % (ref 35.0–47.0)
Hemoglobin: 12.6 g/dL (ref 12.0–16.0)
Lymphocytes Relative: 16 %
Lymphs Abs: 1.4 10*3/uL (ref 1.0–3.6)
MCH: 31.7 pg (ref 26.0–34.0)
MCHC: 34.3 g/dL (ref 32.0–36.0)
MCV: 92.5 fL (ref 80.0–100.0)
MONO ABS: 0.4 10*3/uL (ref 0.2–0.9)
Monocytes Relative: 4 %
Neutro Abs: 6.2 10*3/uL (ref 1.4–6.5)
Neutrophils Relative %: 76 %
PLATELETS: 256 10*3/uL (ref 150–440)
RBC: 3.97 MIL/uL (ref 3.80–5.20)
RDW: 13.4 % (ref 11.5–14.5)
WBC: 8.3 10*3/uL (ref 3.6–11.0)

## 2018-07-09 ENCOUNTER — Other Ambulatory Visit
Admission: RE | Admit: 2018-07-09 | Discharge: 2018-07-09 | Disposition: A | Discharge status: Home / Self Care | Payer: Managed Care, Other (non HMO) | Source: Ambulatory Visit | Attending: Psychiatry | Admitting: Psychiatry

## 2018-07-09 DIAGNOSIS — F259 Schizoaffective disorder, unspecified: Secondary | ICD-10-CM | POA: Insufficient documentation

## 2018-07-09 DIAGNOSIS — Z79899 Other long term (current) drug therapy: Secondary | ICD-10-CM | POA: Insufficient documentation

## 2018-07-09 LAB — CBC WITH DIFFERENTIAL/PLATELET
BASOS ABS: 0.1 10*3/uL (ref 0–0.1)
Basophils Relative: 1 %
Eosinophils Absolute: 0.3 10*3/uL (ref 0–0.7)
Eosinophils Relative: 3 %
HCT: 36.5 % (ref 35.0–47.0)
HEMOGLOBIN: 12.4 g/dL (ref 12.0–16.0)
LYMPHS ABS: 1.6 10*3/uL (ref 1.0–3.6)
LYMPHS PCT: 19 %
MCH: 31.3 pg (ref 26.0–34.0)
MCHC: 33.9 g/dL (ref 32.0–36.0)
MCV: 92.3 fL (ref 80.0–100.0)
Monocytes Absolute: 0.6 10*3/uL (ref 0.2–0.9)
Monocytes Relative: 7 %
Neutro Abs: 6.1 10*3/uL (ref 1.4–6.5)
Neutrophils Relative %: 70 %
PLATELETS: 280 10*3/uL (ref 150–440)
RBC: 3.95 MIL/uL (ref 3.80–5.20)
RDW: 13.2 % (ref 11.5–14.5)
WBC: 8.6 10*3/uL (ref 3.6–11.0)

## 2018-07-27 ENCOUNTER — Other Ambulatory Visit: Payer: Self-pay | Admitting: Family Medicine

## 2018-07-27 DIAGNOSIS — Z1231 Encounter for screening mammogram for malignant neoplasm of breast: Secondary | ICD-10-CM

## 2018-08-10 ENCOUNTER — Other Ambulatory Visit
Admission: RE | Admit: 2018-08-10 | Discharge: 2018-08-10 | Disposition: A | Discharge status: Home / Self Care | Payer: Managed Care, Other (non HMO) | Source: Ambulatory Visit | Attending: Psychiatry | Admitting: Psychiatry

## 2018-08-10 DIAGNOSIS — F259 Schizoaffective disorder, unspecified: Secondary | ICD-10-CM | POA: Diagnosis present

## 2018-08-10 DIAGNOSIS — Z79899 Other long term (current) drug therapy: Secondary | ICD-10-CM | POA: Insufficient documentation

## 2018-08-10 LAB — CBC WITH DIFFERENTIAL/PLATELET
BASOS ABS: 0.1 10*3/uL (ref 0–0.1)
Basophils Relative: 1 %
EOS ABS: 0.4 10*3/uL (ref 0–0.7)
EOS PCT: 4 %
HCT: 37 % (ref 35.0–47.0)
Hemoglobin: 12.6 g/dL (ref 12.0–16.0)
LYMPHS PCT: 25 %
Lymphs Abs: 2.2 10*3/uL (ref 1.0–3.6)
MCH: 31.7 pg (ref 26.0–34.0)
MCHC: 34.2 g/dL (ref 32.0–36.0)
MCV: 92.7 fL (ref 80.0–100.0)
Monocytes Absolute: 0.6 10*3/uL (ref 0.2–0.9)
Monocytes Relative: 7 %
Neutro Abs: 5.6 10*3/uL (ref 1.4–6.5)
Neutrophils Relative %: 63 %
PLATELETS: 276 10*3/uL (ref 150–440)
RBC: 3.99 MIL/uL (ref 3.80–5.20)
RDW: 12.8 % (ref 11.5–14.5)
WBC: 8.8 10*3/uL (ref 3.6–11.0)

## 2018-08-11 ENCOUNTER — Ambulatory Visit
Admission: RE | Admit: 2018-08-11 | Discharge: 2018-08-11 | Disposition: A | Discharge status: Home / Self Care | Payer: Managed Care, Other (non HMO) | Source: Ambulatory Visit | Attending: Family Medicine | Admitting: Family Medicine

## 2018-08-11 DIAGNOSIS — Z1231 Encounter for screening mammogram for malignant neoplasm of breast: Secondary | ICD-10-CM | POA: Insufficient documentation

## 2018-08-17 ENCOUNTER — Other Ambulatory Visit: Payer: Self-pay | Admitting: Family Medicine

## 2018-08-17 DIAGNOSIS — Z01419 Encounter for gynecological examination (general) (routine) without abnormal findings: Secondary | ICD-10-CM

## 2018-08-27 ENCOUNTER — Other Ambulatory Visit: Payer: Self-pay | Admitting: Family Medicine

## 2018-08-27 NOTE — Telephone Encounter (Signed)
Refill request was sent to Dr. Krichna F. Sowles for approval and submission.  

## 2018-08-27 NOTE — Telephone Encounter (Signed)
Copied from [place] [id number]. Topic: Quick Communication - Rx Refill/Question >> Aug 27, 2018  1:25 PM Scherrie Gerlach wrote: Medication: clonazePAM (KLONOPIN) 0.5 MG tablet  (pt takes 1.5 tabs a day)  Pt states the pharmacy advised her to call the office, I do not see the dr has ever filled for this pt. [id number] [address], [place] [phone] (Phone) [phone] (Fax)

## 2018-08-31 ENCOUNTER — Other Ambulatory Visit: Payer: Self-pay | Admitting: Family Medicine

## 2018-08-31 DIAGNOSIS — E559 Vitamin D deficiency, unspecified: Secondary | ICD-10-CM

## 2018-09-02 ENCOUNTER — Ambulatory Visit (INDEPENDENT_AMBULATORY_CARE_PROVIDER_SITE_OTHER): Payer: Managed Care, Other (non HMO) | Admitting: Family Medicine

## 2018-09-02 ENCOUNTER — Encounter: Payer: Self-pay | Admitting: Family Medicine

## 2018-09-02 VITALS — BP 108/62 | HR 89 | Temp 98.3°F | Resp 14 | Ht 64.0 in | Wt 143.6 lb

## 2018-09-02 DIAGNOSIS — Z23 Encounter for immunization: Secondary | ICD-10-CM | POA: Diagnosis not present

## 2018-09-02 DIAGNOSIS — Z01419 Encounter for gynecological examination (general) (routine) without abnormal findings: Secondary | ICD-10-CM

## 2018-09-02 DIAGNOSIS — Z1239 Encounter for other screening for malignant neoplasm of breast: Secondary | ICD-10-CM | POA: Diagnosis not present

## 2018-09-02 DIAGNOSIS — Z124 Encounter for screening for malignant neoplasm of cervix: Secondary | ICD-10-CM

## 2018-09-02 NOTE — Patient Instructions (Signed)
Preventive Care 40-64 Years, Female Preventive care refers to lifestyle choices and visits with your health care provider that can promote health and wellness. What does preventive care include?  A yearly physical exam. This is also called an annual well check.  Dental exams once or twice a year.  Routine eye exams. Ask your health care provider how often you should have your eyes checked.  Personal lifestyle choices, including: ? Daily care of your teeth and gums. ? Regular physical activity. ? Eating a healthy diet. ? Avoiding tobacco and drug use. ? Limiting alcohol use. ? Practicing safe sex. ? Taking low-dose aspirin daily starting at age 58. ? Taking vitamin and mineral supplements as recommended by your health care provider. What happens during an annual well check? The services and screenings done by your health care provider during your annual well check will depend on your age, overall health, lifestyle risk factors, and family history of disease. Counseling Your health care provider may ask you questions about your:  Alcohol use.  Tobacco use.  Drug use.  Emotional well-being.  Home and relationship well-being.  Sexual activity.  Eating habits.  Work and work Statistician.  Method of birth control.  Menstrual cycle.  Pregnancy history.  Screening You may have the following tests or measurements:  Height, weight, and BMI.  Blood pressure.  Lipid and cholesterol levels. These may be checked every 5 years, or more frequently if you are over 81 years old.  Skin check.  Lung cancer screening. You may have this screening every year starting at age 78 if you have a 30-pack-year history of smoking and currently smoke or have quit within the past 15 years.  Fecal occult blood test (FOBT) of the stool. You may have this test every year starting at age 65.  Flexible sigmoidoscopy or colonoscopy. You may have a sigmoidoscopy every 5 years or a colonoscopy  every 10 years starting at age 30.  Hepatitis C blood test.  Hepatitis B blood test.  Sexually transmitted disease (STD) testing.  Diabetes screening. This is done by checking your blood sugar (glucose) after you have not eaten for a while (fasting). You may have this done every 1-3 years.  Mammogram. This may be done every 1-2 years. Talk to your health care provider about when you should start having regular mammograms. This may depend on whether you have a family history of breast cancer.  BRCA-related cancer screening. This may be done if you have a family history of breast, ovarian, tubal, or peritoneal cancers.  Pelvic exam and Pap test. This may be done every 3 years starting at age 80. Starting at age 36, this may be done every 5 years if you have a Pap test in combination with an HPV test.  Bone density scan. This is done to screen for osteoporosis. You may have this scan if you are at high risk for osteoporosis.  Discuss your test results, treatment options, and if necessary, the need for more tests with your health care provider. Vaccines Your health care provider may recommend certain vaccines, such as:  Influenza vaccine. This is recommended every year.  Tetanus, diphtheria, and acellular pertussis (Tdap, Td) vaccine. You may need a Td booster every 10 years.  Varicella vaccine. You may need this if you have not been vaccinated.  Zoster vaccine. You may need this after age 5.  Measles, mumps, and rubella (MMR) vaccine. You may need at least one dose of MMR if you were born in  1956 or later. You may also need a second dose.  Pneumococcal 13-valent conjugate (PCV13) vaccine. You may need this if you have certain conditions and were not previously vaccinated.  Pneumococcal polysaccharide (PPSV23) vaccine. You may need one or two doses if you smoke cigarettes or if you have certain conditions.  Meningococcal vaccine. You may need this if you have certain  conditions.  Hepatitis A vaccine. You may need this if you have certain conditions or if you travel or work in places where you may be exposed to hepatitis A.  Hepatitis B vaccine. You may need this if you have certain conditions or if you travel or work in places where you may be exposed to hepatitis B.  Haemophilus influenzae type b (Hib) vaccine. You may need this if you have certain conditions.  Talk to your health care provider about which screenings and vaccines you need and how often you need them. This information is not intended to replace advice given to you by your health care provider. Make sure you discuss any questions you have with your health care provider. Document Released: 12/15/2015 Document Revised: 08/07/2016 Document Reviewed: 09/19/2015 Elsevier Interactive Patient Education  2018 Elsevier Inc.  

## 2018-09-02 NOTE — Progress Notes (Signed)
Name: Carla Thompson   MRN: [id number]    DOB: Apr 29, 1961   Date:09/02/2018       Progress Note  Subjective  Chief Complaint  Chief Complaint  Patient presents with  . Annual Exam    patient sleeps about 8hrs. patient trys to eat a well balanced diet and drinks about 6 glasses of water/ day  . Immunizations    declines flu shot    HPI   Patient presents for annual CPE      Office Visit from 09/02/2018 in [facility]  AUDIT-C Score  0     Depression:  Depression screen [facility] 2/9 09/02/2018 05/18/2018 08/25/2017 05/22/2016 02/20/2016  Decreased Interest 0 0 0 0 0  Down, Depressed, Hopeless 0 0 0 1 0  PHQ - 2 Score 0 0 0 1 0  Altered sleeping 0 0 - - -  Tired, decreased energy 3 1 - - -  Change in appetite 0 0 - - -  Feeling bad or failure about yourself  0 0 - - -  Trouble concentrating 0 0 - - -  Moving slowly or fidgety/restless 0 1 - - -  Suicidal thoughts 0 0 - - -  PHQ-9 Score 3 2 - - -  Difficult doing work/chores Somewhat difficult - - - -   Hypertension: BP Readings from Last 3 Encounters:  09/02/18 108/62  05/18/18 114/66  11/17/17 90/60   Obesity: Wt Readings from Last 3 Encounters:  09/02/18 143 lb 9.6 oz (65.1 kg)  05/18/18 144 lb 11.2 oz (65.6 kg)  11/17/17 141 lb 4.8 oz (64.1 kg)   BMI Readings from Last 3 Encounters:  09/02/18 24.65 kg/m  05/18/18 23.36 kg/m  11/17/17 22.81 kg/m    Hep C Screening: up to date  STD testing and prevention (HIV/chl/gon/syphilis): N/A Intimate partner violence: negative screen  Sexual History/Pain during Intercourse: she has pain during intercourse intermittent- lubrication helps with symptoms  Menstrual History/LMP/Abnormal Bleeding: discussed importance of follow up if any post-menopausal bleeding  Incontinence Symptoms: intermittent symptoms when bladder is to full   Advanced Care Planning: A voluntary discussion about advance care planning including the explanation and discussion of advance  directives.  Discussed health care proxy and Living will, and the patient was able to identify a health care proxy as husband .  Patient does not have a living will at present time.  Breast cancer:  HM Mammogram  Date Value Ref Range Status  08/30/2013 Normal  Final    BRCA gene screening: paternal grandmother had colon cancer, maternal aunt had ovarian . Not interested  Cervical cancer screening: discussed USPTF  Osteoporosis Screening: discussed high calcium and vitamin D diet   Lipids:  Lab Results  Component Value Date   CHOL 265 (H) 05/18/2018   CHOL 246 (H) 11/18/2017   CHOL 222 (H) 06/13/2017   Lab Results  Component Value Date   HDL 62 05/18/2018   HDL 60 11/18/2017   HDL 54 06/13/2017   Lab Results  Component Value Date   LDLCALC 163 (H) 05/18/2018   LDLCALC 149 (H) 11/18/2017   LDLCALC 133 (H) 06/13/2017   Lab Results  Component Value Date   TRIG 201 (H) 05/18/2018   TRIG 231 (H) 11/18/2017   TRIG 177 (H) 06/13/2017   Lab Results  Component Value Date   CHOLHDL 4.3 05/18/2018   CHOLHDL 4.1 11/18/2017   CHOLHDL 4.1 06/13/2017   No results found for: LDLDIRECT  Glucose:  Glucose  Date Value Ref Range Status  08/27/2013 98 65 - 99 mg/dL Final  03/05/2013 105 (H) 65 - 99 mg/dL Final  03/04/2013 105 (H) 65 - 99 mg/dL Final   Glucose, Bld  Date Value Ref Range Status  05/18/2018 105 (H) 65 - 99 mg/dL Final  11/18/2017 99 65 - 99 mg/dL Final    Comment:    .            Fasting reference interval .   06/13/2017 108 (H) 65 - 99 mg/dL Final   Glucose-Capillary  Date Value Ref Range Status  12/13/2015 97 65 - 99 mg/dL Final    Skin cancer: discussed atypical lesions  Colorectal cancer: up to date Lung cancer:  Low Dose CT Chest recommended if Age 56-80 years, 30 pack-year currently smoking OR have quit w/in 15years. Patient does not qualify.   ECG: done in 2017  Patient Active Problem List   Diagnosis Date Noted  . Hyperlipidemia 11/20/2017   . GERD without esophagitis 02/21/2017  . Tachycardia 02/20/2016  . RLS (restless legs syndrome) 12/21/2015  . IBS (irritable bowel syndrome) 12/21/2015  . Other long term (current) drug therapy 12/08/2015  . Chronic constipation 12/08/2015  . Insomnia, persistent 12/08/2015  . Dermatitis, eczematoid 12/08/2015  . Gravida 2 para 2 12/08/2015  . Hypertriglyceridemia 12/08/2015  . Metallic taste 53/20/2334  . 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 Schizoaffective disorder, bipolar type ([place]) 12/08/2015    Past Surgical History:  Procedure Laterality Date  . ANUS SURGERY  2000   [place]  . TUBAL LIGATION  12/02/1994    Family History  Problem Relation Age of Onset  . Mental illness Sister        Schizophrenia and bipolar  . Cancer Maternal Grandmother        Colon  . Mental illness Paternal Grandmother        Schizophrenia  . Breast cancer Neg Hx     Social History   Socioeconomic History  . Marital status: Married    Spouse name: [name]  . Number of children: 2  . Years of education: Not on file  . Highest education level: Bachelor's degree (e.g., BA, AB, BS)  Occupational History  . Not on file  Social Needs  . Financial resource strain: Not hard at all  . Food insecurity:    Worry: Never true    Inability: Never true  . Transportation needs:    Medical: No    Non-medical: No  Tobacco Use  . Smoking status: Never Smoker  . Smokeless tobacco: Never Used  Substance and Sexual Activity  . Alcohol use: No    Alcohol/week: 0.0 standard drinks  . Drug use: No  . Sexual activity: Not Currently    Partners: Male  Lifestyle  . Physical activity:    Days per week: 3 days    Minutes per session: 10 min  . Stress: Not at all  Relationships  . Social connections:    Talks on phone: More than three times a week    Gets together: Three times a week    Attends religious service: More than 4 times per year    Active member of club or organization: No     Attends meetings of clubs or organizations: Never    Relationship status: Married  . Intimate partner violence:    Fear of current or ex partner: No    Emotionally abused: No    Physically abused: No    Forced sexual  activity: No  Other Topics Concern  . Not on file  Social History Narrative  . Not on file     Current Outpatient Medications:  .  ASPIRIN LOW DOSE 81 MG EC tablet, TAKE 1 TABLET(81 MG) BY MOUTH DAILY, Disp: 30 tablet, Rfl: 0 .  B Complex-C (SUPER B COMPLEX PO), Take 1 tablet by mouth daily., Disp: , Rfl:  .  CINNAMON PO, Take by mouth., Disp: , Rfl:  .  clonazePAM (KLONOPIN) 0.5 MG tablet, Take 0.5 mg by mouth at bedtime., Disp: , Rfl:  .  cloZAPine (CLOZARIL) 100 MG tablet, Take 100-300 mg by mouth 2 (two) times daily. Pt takes one tablet in the morning and three tablets at bedtime. , Disp: , Rfl:  .  docusate sodium (STOOL SOFTENER) 100 MG capsule, Take 100 mg by mouth 2 (two) times daily as needed for mild constipation. , Disp: , Rfl:  .  famotidine (PEPCID) 20 MG tablet, Take 20 mg by mouth 2 (two) times daily., Disp: , Rfl:  .  linaclotide (LINZESS) 145 MCG CAPS capsule, TAKE 1 CAPSULE(145 MCG) BY MOUTH DAILY, Disp: 30 capsule, Rfl: 5 .  lithium carbonate 300 MG capsule, TK 2 CS PO QPM, Disp: , Rfl: 0 .  metoprolol tartrate (LOPRESSOR) 25 MG tablet, Take 0.5 tablets (12.5 mg total) by mouth 2 (two) times daily., Disp: 90 tablet, Rfl: 1 .  vitamin C (ASCORBIC ACID) 500 MG tablet, Take 500 mg by mouth daily., Disp: , Rfl:  .  Vitamin D, Ergocalciferol, (DRISDOL) 50000 units CAPS capsule, TAKE 1 CAPSULE BY MOUTH EVERY 7 DAYS, Disp: 12 capsule, Rfl: 0 .  vitamin E 1000 UNIT capsule, Take 1,000 Units by mouth daily. , Disp: , Rfl:   Allergies  Allergen Reactions  . Escitalopram Hives  . Levsin [Hyoscyamine Sulfate] Other (See Comments)    Reaction:  Patient took medication and felt really spacey     ROS  Constitutional: Negative for fever or weight change.   Respiratory: Negative for cough and shortness of breath.   Cardiovascular: Negative for chest pain or palpitations.  Gastrointestinal: Negative for abdominal pain, no bowel changes.  Musculoskeletal: Negative for gait problem or joint swelling.  Skin: Negative for rash.  Neurological: Negative for dizziness or headache.  No other specific complaints in a complete review of systems (except as listed in HPI above).  Objective  Vitals:   09/02/18 0805  BP: 108/62  Pulse: 89  Resp: 14  Temp: 98.3 F (36.8 C)  TempSrc: Oral  SpO2: 98%  Weight: 143 lb 9.6 oz (65.1 kg)  Height: _0  (1.626 m)    Body mass index is 24.65 kg/m.  Physical Exam  Constitutional: Patient appears well-developed and well-nourished. No distress.  HENT: Head: Normocephalic and atraumatic. Ears: B TMs ok, no erythema or effusion; Nose: Nose normal. Mouth/Throat: Oropharynx is clear and moist. No oropharyngeal exudate.  Eyes: Conjunctivae and EOM are normal. Pupils are equal, round, and reactive to light. No scleral icterus.  Neck: Normal range of motion. Neck supple. No JVD present. No thyromegaly present.  Cardiovascular: Normal rate, regular rhythm and normal heart sounds.  No murmur heard. No BLE edema. Pulmonary/Chest: Effort normal and breath sounds normal. No respiratory distress. Abdominal: Soft. Bowel sounds are normal, no distension. There is no tenderness. no masses Breast: no lumps or masses, no nipple discharge or rashes FEMALE GENITALIA:  External genitalia normal External urethra normal Pelvic not done  RECTAL: not done  Musculoskeletal: Normal range of  motion, no joint effusions. No gross deformities Neurological: he is alert and oriented to person, place, and time. No cranial nerve deficit. Coordination, balance, strength, speech and gait are normal.  Skin: Skin is warm and dry. No rash noted. No erythema.  Psychiatric: Patient has a normal mood and affect. behavior is normal. Judgment  and thought content normal.   Recent Results (from the past 2160 hour(s))  CBC with Differential/Platelet     Status: None   Collection Time: 06/16/18 10:42 AM  Result Value Ref Range   WBC 8.3 3.6 - 11.0 K/uL   RBC 3.97 3.80 - 5.20 MIL/uL   Hemoglobin 12.6 12.0 - 16.0 g/dL   HCT 36.7 35.0 - 47.0 %   MCV 92.5 80.0 - 100.0 fL   MCH 31.7 26.0 - 34.0 pg   MCHC 34.3 32.0 - 36.0 g/dL   RDW 13.4 11.5 - 14.5 %   Platelets 256 150 - 440 K/uL   Neutrophils Relative % 76 %   Neutro Abs 6.2 1.4 - 6.5 K/uL   Lymphocytes Relative 16 %   Lymphs Abs 1.4 1.0 - 3.6 K/uL   Monocytes Relative 4 %   Monocytes Absolute 0.4 0.2 - 0.9 K/uL   Eosinophils Relative 3 %   Eosinophils Absolute 0.3 0 - 0.7 K/uL   Basophils Relative 1 %   Basophils Absolute 0.1 0 - 0.1 K/uL    Comment: Performed at [facility], [address] [place], [place] [address]  CBC with Differential/Platelet     Status: None   Collection Time: 07/09/18 12:18 PM  Result Value Ref Range   WBC 8.6 3.6 - 11.0 K/uL   RBC 3.95 3.80 - 5.20 MIL/uL   Hemoglobin 12.4 12.0 - 16.0 g/dL   HCT 36.5 35.0 - 47.0 %   MCV 92.3 80.0 - 100.0 fL   MCH 31.3 26.0 - 34.0 pg   MCHC 33.9 32.0 - 36.0 g/dL   RDW 13.2 11.5 - 14.5 %   Platelets 280 150 - 440 K/uL   Neutrophils Relative % 70 %   Neutro Abs 6.1 1.4 - 6.5 K/uL   Lymphocytes Relative 19 %   Lymphs Abs 1.6 1.0 - 3.6 K/uL   Monocytes Relative 7 %   Monocytes Absolute 0.6 0.2 - 0.9 K/uL   Eosinophils Relative 3 %   Eosinophils Absolute 0.3 0 - 0.7 K/uL   Basophils Relative 1 %   Basophils Absolute 0.1 0 - 0.1 K/uL    Comment: Performed at [facility], [address] [place], [place] [address]  CBC with Differential/Platelet     Status: None   Collection Time: 08/10/18 12:01 PM  Result Value Ref Range   WBC 8.8 3.6 - 11.0 K/uL   RBC 3.99 3.80 - 5.20 MIL/uL   Hemoglobin 12.6 12.0 - 16.0 g/dL   HCT 37.0 35.0 - 47.0 %   MCV 92.7 80.0 - 100.0 fL   MCH 31.7 26.0 -  34.0 pg   MCHC 34.2 32.0 - 36.0 g/dL   RDW 12.8 11.5 - 14.5 %   Platelets 276 150 - 440 K/uL   Neutrophils Relative % 63 %   Neutro Abs 5.6 1.4 - 6.5 K/uL   Lymphocytes Relative 25 %   Lymphs Abs 2.2 1.0 - 3.6 K/uL   Monocytes Relative 7 %   Monocytes Absolute 0.6 0.2 - 0.9 K/uL   Eosinophils Relative 4 %   Eosinophils Absolute 0.4 0 - 0.7 K/uL   Basophils  Relative 1 %   Basophils Absolute 0.1 0 - 0.1 K/uL    Comment: Performed at [facility], [address] [place], [place] [address]      PHQ2/9: Depression screen [facility] 2/9 09/02/2018 05/18/2018 08/25/2017 05/22/2016 02/20/2016  Decreased Interest 0 0 0 0 0  Down, Depressed, Hopeless 0 0 0 1 0  PHQ - 2 Score 0 0 0 1 0  Altered sleeping 0 0 - - -  Tired, decreased energy 3 1 - - -  Change in appetite 0 0 - - -  Feeling bad or failure about yourself  0 0 - - -  Trouble concentrating 0 0 - - -  Moving slowly or fidgety/restless 0 1 - - -  Suicidal thoughts 0 0 - - -  PHQ-9 Score 3 2 - - -  Difficult doing work/chores Somewhat difficult - - - -     Fall Risk: Fall Risk  09/02/2018 05/18/2018 11/17/2017 08/25/2017 08/23/2016  Falls in the past year? Yes No No No No  Number falls in past yr: 1 - - - -  Comment July 1st - - - -  Injury with Fall? Yes - - - -  Comment right knee - - - -  Risk for fall due to : - - - History of fall(s) -     Functional Status Survey: Is the patient deaf or have difficulty hearing?: No Does the patient have difficulty seeing, even when wearing glasses/contacts?: No Does the patient have difficulty concentrating, remembering, or making decisions?: Yes Does the patient have difficulty walking or climbing stairs?: No Does the patient have difficulty dressing or bathing?: No Does the patient have difficulty doing errands alone such as visiting a doctor's office or shopping?: Yes   Assessment & Plan  1. Well woman exam   2. Cervical cancer screening  Discussed USPTF, we will repeat  in 2 years   3. Breast cancer screening  Up to date  4. Needs flu shot  refused   -USPSTF grade A and B recommendations reviewed with patient; age-appropriate recommendations, preventive care, screening tests, etc discussed and encouraged; healthy living encouraged; see AVS for patient education given to patient -Discussed importance of 150 minutes of physical activity weekly, eat two servings of fish weekly, eat one serving of tree nuts ( cashews, pistachios, pecans, almonds.Marland Kitchen) every other day, eat 6 servings of fruit/vegetables daily and drink plenty of water and avoid sweet beverages.

## 2018-09-03 ENCOUNTER — Other Ambulatory Visit
Admission: RE | Admit: 2018-09-03 | Discharge: 2018-09-03 | Disposition: A | Discharge status: Home / Self Care | Payer: Managed Care, Other (non HMO) | Source: Ambulatory Visit | Attending: Psychiatry | Admitting: Psychiatry

## 2018-09-03 ENCOUNTER — Other Ambulatory Visit: Payer: Self-pay

## 2018-09-03 DIAGNOSIS — F259 Schizoaffective disorder, unspecified: Secondary | ICD-10-CM | POA: Insufficient documentation

## 2018-09-03 DIAGNOSIS — Z79899 Other long term (current) drug therapy: Secondary | ICD-10-CM | POA: Insufficient documentation

## 2018-09-03 LAB — CBC WITH DIFFERENTIAL/PLATELET
Basophils Absolute: 0 10*3/uL (ref 0–0.1)
Basophils Relative: 0 %
Eosinophils Absolute: 0.2 10*3/uL (ref 0–0.7)
Eosinophils Relative: 2 %
HEMATOCRIT: 36.2 % (ref 35.0–47.0)
HEMOGLOBIN: 12.4 g/dL (ref 12.0–16.0)
LYMPHS ABS: 2.1 10*3/uL (ref 1.0–3.6)
LYMPHS PCT: 19 %
MCH: 32.4 pg (ref 26.0–34.0)
MCHC: 34.3 g/dL (ref 32.0–36.0)
MCV: 94.5 fL (ref 80.0–100.0)
MONOS PCT: 5 %
Monocytes Absolute: 0.5 10*3/uL (ref 0.2–0.9)
NEUTROS PCT: 74 %
Neutro Abs: 8.4 10*3/uL — ABNORMAL HIGH (ref 1.4–6.5)
Platelets: 258 10*3/uL (ref 150–440)
RBC: 3.83 MIL/uL (ref 3.80–5.20)
RDW: 13 % (ref 11.5–14.5)
WBC: 11.2 10*3/uL — AB (ref 3.6–11.0)

## 2018-09-03 MED ORDER — LITHIUM CARBONATE 300 MG PO TABS: 600.0000 mg | ORAL_TABLET | Freq: Every day | ORAL | 0 refills | Status: DC

## 2018-09-03 MED ORDER — CLONAZEPAM 0.5 MG PO TABS: ORAL_TABLET | ORAL | 1 refills | Status: DC

## 2018-09-04 ENCOUNTER — Other Ambulatory Visit: Payer: Self-pay

## 2018-09-04 MED ORDER — LITHIUM CARBONATE 300 MG PO TABS: 600.0000 mg | ORAL_TABLET | Freq: Every day | ORAL | 0 refills | Status: DC

## 2018-09-04 MED ORDER — CLONAZEPAM 0.5 MG PO TABS: ORAL_TABLET | ORAL | 1 refills | Status: DC

## 2018-09-07 ENCOUNTER — Other Ambulatory Visit: Payer: Self-pay

## 2018-09-07 MED ORDER — CLOZAPINE 100 MG PO TABS: ORAL_TABLET | ORAL | 2 refills | Status: DC

## 2018-09-09 ENCOUNTER — Ambulatory Visit: Payer: Managed Care, Other (non HMO) | Admitting: Psychiatry

## 2018-09-09 DIAGNOSIS — F250 Schizoaffective disorder, bipolar type: Secondary | ICD-10-CM

## 2018-09-09 DIAGNOSIS — F4001 Agoraphobia with panic disorder: Secondary | ICD-10-CM | POA: Diagnosis not present

## 2018-09-09 MED ORDER — CLOZAPINE 100 MG PO TABS: ORAL_TABLET | ORAL | 6 refills | Status: DC

## 2018-09-09 MED ORDER — CLONAZEPAM 0.5 MG PO TABS: ORAL_TABLET | ORAL | 5 refills | Status: DC

## 2018-09-09 NOTE — Progress Notes (Signed)
[facility]  Patient ID: Carla Thompson,  MRN: [id number]  PCP: Steele Sizer, MD  Date of Evaluation: 09/09/2018 Time spent:30 minutes   HISTORY/CURRENT STATUS: HPI Worries when runs out of clonazepam bc of computer system change.  Sleep poorly.  About 8 hours with meds. She is easily anxious and easily overwhelmed.  Has difficult T with abstract tasks and needs assistance.  Tends to be forgetful. No fear, confusion, voices.  H agrees she's doing ok with these areas.  Individual Medical History/ Review of Systems: Changes? :Yes chronic constipation off and on despite meds.  Alt with diarrhea.  Saw PCP about it last week. BS a little up.  Allergies: Escitalopram and Levsin [hyoscyamine sulfate]  Current Medications:  Current Outpatient Medications:  .  ASPIRIN LOW DOSE 81 MG EC tablet, TAKE 1 TABLET(81 MG) BY MOUTH DAILY, Disp: 30 tablet, Rfl: 0 .  B Complex-C (SUPER B COMPLEX PO), Take 1 tablet by mouth daily., Disp: , Rfl:  .  CINNAMON PO, Take by mouth., Disp: , Rfl:  .  clonazePAM (KLONOPIN) 0.5 MG tablet, I tablet every morning and 1 1/2 tablets at bedtime., Disp: 75 tablet, Rfl: 1 .  cloZAPine (CLOZARIL) 100 MG tablet, Pt takes one tablet in the morning and three tablets at bedtime., Disp: 120 tablet, Rfl: 2 .  docusate sodium (STOOL SOFTENER) 100 MG capsule, Take 100 mg by mouth 2 (two) times daily as needed for mild constipation. , Disp: , Rfl:  .  famotidine (PEPCID) 20 MG tablet, Take 20 mg by mouth 2 (two) times daily., Disp: , Rfl:  .  linaclotide (LINZESS) 145 MCG CAPS capsule, TAKE 1 CAPSULE(145 MCG) BY MOUTH DAILY, Disp: 30 capsule, Rfl: 5 .  lithium 300 MG tablet, Take 2 tablets (600 mg total) by mouth at bedtime., Disp: 180 tablet, Rfl: 0 .  metoprolol tartrate (LOPRESSOR) 25 MG tablet, Take 0.5 tablets (12.5 mg total) by mouth 2 (two) times daily., Disp: 90 tablet, Rfl: 1 .  Pyridoxine HCl (VITAMIN B-6) 500 MG tablet, Take 500 mg by mouth 2 (two) times  daily., Disp: , Rfl:  .  vitamin C (ASCORBIC ACID) 500 MG tablet, Take 500 mg by mouth daily., Disp: , Rfl:  .  Vitamin D, Ergocalciferol, (DRISDOL) 50000 units CAPS capsule, TAKE 1 CAPSULE BY MOUTH EVERY 7 DAYS, Disp: 12 capsule, Rfl: 0 .  vitamin E 1000 UNIT capsule, Take 1,000 Units by mouth daily. , Disp: , Rfl:  Medication Side Effects: Abdominal Pain, constipation  Family Medical/ Social History: Changes? Yes Trying to reduce sugar and take short walks. D just engaged for March wedding.  MENTAL HEALTH EXAM:  Last menstrual period 12/16/2016.There is no height or weight on file to calculate BMI.  General Appearance: Casual  Eye Contact:  Fair  Speech:  talkative always  Volume:  Increased  Mood:  Anxious  Affect:  Constricted  Thought Process:  Disorganized  Orientation:  Full (Time, Place, and Person)  Thought Content: Logical   Suicidal Thoughts:  No  Homicidal Thoughts:  No  Memory:  Recent  Judgement:  Fair  Insight:  Fair  Psychomotor Activity:  Normal  Concentration:  Concentration: Fair  Recall:  [place] of Knowledge: Fair  Language: Good  Akathisia:  No  AIMS (if indicated): done 0  Assets:  Housing Intimacy Social Support  ADL's:  Intact  Cognition: WNL  Prognosis:  Fair   Lab Results  Component Value Date   CHOL 265 (H) 05/18/2018  HDL 62 05/18/2018   LDLCALC 163 (H) 05/18/2018   TRIG 201 (H) 05/18/2018   CHOLHDL 4.3 05/18/2018     Chemistry      Component Value Date/Time   NA 139 05/18/2018 1305   NA 140 01/18/2016 1418   NA 139 08/27/2013 0930   K 4.1 05/18/2018 1305   K 4.2 08/27/2013 0930   CL 105 05/18/2018 1305   CL 108 (H) 08/27/2013 0930   CO2 25 05/18/2018 1305   CO2 28 08/27/2013 0930   BUN 14 05/18/2018 1305   BUN 17 01/18/2016 1418   BUN 11 08/27/2013 0930   CREATININE 0.89 05/18/2018 1305   CREATININE 0.95 11/18/2017 0922      Component Value Date/Time   CALCIUM 9.1 05/18/2018 1305   CALCIUM 8.5 08/27/2013 0930    ALKPHOS 74 05/18/2018 1305   ALKPHOS 107 08/27/2013 0930   AST 24 05/18/2018 1305   AST 20 08/27/2013 0930   ALT 19 05/18/2018 1305   ALT 19 08/27/2013 0930   BILITOT 0.6 05/18/2018 1305   BILITOT <0.2 01/18/2016 1418   BILITOT 0.3 08/27/2013 0930     Lab Results  Component Value Date   HGBA1C 4.9 05/18/2018   Lab Results  Component Value Date   WBC 11.2 (H) 09/03/2018   HGB 12.4 09/03/2018   HCT 36.2 09/03/2018   MCV 94.5 09/03/2018   PLT 258 09/03/2018     DIAGNOSES:    ICD-10-CM   1. Schizoaffective disorder, bipolar type ([place]) F25.0   2. Panic disorder with agoraphobia F40.01     RECOMMENDATIONS:  Extensive discussion of relapse prevention dt long hx of severe prolonged pscyhotic episodes slow to respond to anything including clozapine.  Has residual loose thought and social skills deficits, hyperverbal.  Hesitate to increase further bc constipation.  She is otherwise satisfied with her medications.  Disc S/S lithium toxicity. Check lithium level  Monthly CBC dt clozapine;ine.  Disc risk neutropenia with complications.  Constipation management dt risk SBO from clozapine.    Purnell Shoemaker, MD

## 2018-09-15 ENCOUNTER — Other Ambulatory Visit: Payer: Self-pay | Admitting: Family Medicine

## 2018-09-15 DIAGNOSIS — Z01419 Encounter for gynecological examination (general) (routine) without abnormal findings: Secondary | ICD-10-CM

## 2018-09-18 NOTE — Telephone Encounter (Signed)
Copied from [place] [id number]. Topic: General - Other >> Sep 18, 2018 11:29 AM Yvette Rack wrote: Reason for CRM: Pt states the [facility] #[id number] advised her that the Rx for  ASPIRIN LOW DOSE 81 MG EC tablet was never received. Pt requests that the Rx be submitted to [place], [place] [phone] (Phone)  [phone] (Fax)

## 2018-09-20 MED ORDER — ASPIRIN 81 MG PO TBEC: 81.0000 mg | DELAYED_RELEASE_TABLET | Freq: Every day | ORAL | 12 refills | Status: DC

## 2018-10-05 ENCOUNTER — Other Ambulatory Visit
Admission: RE | Admit: 2018-10-05 | Discharge: 2018-10-05 | Disposition: A | Discharge status: Home / Self Care | Payer: 59 | Source: Ambulatory Visit | Attending: Psychiatry | Admitting: Psychiatry

## 2018-10-05 DIAGNOSIS — F259 Schizoaffective disorder, unspecified: Secondary | ICD-10-CM | POA: Diagnosis present

## 2018-10-05 DIAGNOSIS — Z79899 Other long term (current) drug therapy: Secondary | ICD-10-CM | POA: Insufficient documentation

## 2018-10-05 LAB — CBC WITH DIFFERENTIAL/PLATELET
Abs Immature Granulocytes: 0.03 10*3/uL (ref 0.00–0.07)
BASOS ABS: 0.1 10*3/uL (ref 0.0–0.1)
Basophils Relative: 1 %
Eosinophils Absolute: 0.3 10*3/uL (ref 0.0–0.5)
Eosinophils Relative: 4 %
HEMATOCRIT: 39.3 % (ref 36.0–46.0)
HEMOGLOBIN: 12.7 g/dL (ref 12.0–15.0)
IMMATURE GRANULOCYTES: 0 %
Lymphocytes Relative: 26 %
Lymphs Abs: 2.2 10*3/uL (ref 0.7–4.0)
MCH: 31 pg (ref 26.0–34.0)
MCHC: 32.3 g/dL (ref 30.0–36.0)
MCV: 95.9 fL (ref 80.0–100.0)
MONO ABS: 0.6 10*3/uL (ref 0.1–1.0)
MONOS PCT: 7 %
NEUTROS ABS: 5.2 10*3/uL (ref 1.7–7.7)
Neutrophils Relative %: 62 %
Platelets: 288 10*3/uL (ref 150–400)
RBC: 4.1 MIL/uL (ref 3.87–5.11)
RDW: 12.2 % (ref 11.5–15.5)
WBC: 8.4 10*3/uL (ref 4.0–10.5)
nRBC: 0 % (ref 0.0–0.2)

## 2018-10-05 LAB — LITHIUM LEVEL: LITHIUM LVL: 0.69 mmol/L (ref 0.60–1.20)

## 2018-11-03 ENCOUNTER — Other Ambulatory Visit
Admission: RE | Admit: 2018-11-03 | Discharge: 2018-11-03 | Disposition: A | Discharge status: Home / Self Care | Payer: 59 | Source: Ambulatory Visit | Attending: Psychiatry | Admitting: Psychiatry

## 2018-11-03 DIAGNOSIS — Z79899 Other long term (current) drug therapy: Secondary | ICD-10-CM | POA: Insufficient documentation

## 2018-11-03 DIAGNOSIS — F259 Schizoaffective disorder, unspecified: Secondary | ICD-10-CM | POA: Diagnosis not present

## 2018-11-03 LAB — CBC WITH DIFFERENTIAL/PLATELET
ABS IMMATURE GRANULOCYTES: 0.03 10*3/uL (ref 0.00–0.07)
BASOS PCT: 1 %
Basophils Absolute: 0.1 10*3/uL (ref 0.0–0.1)
EOS ABS: 0.4 10*3/uL (ref 0.0–0.5)
EOS PCT: 5 %
HCT: 39.3 % (ref 36.0–46.0)
Hemoglobin: 12.5 g/dL (ref 12.0–15.0)
Immature Granulocytes: 0 %
Lymphocytes Relative: 25 %
Lymphs Abs: 2 10*3/uL (ref 0.7–4.0)
MCH: 30.9 pg (ref 26.0–34.0)
MCHC: 31.8 g/dL (ref 30.0–36.0)
MCV: 97 fL (ref 80.0–100.0)
MONO ABS: 0.4 10*3/uL (ref 0.1–1.0)
MONOS PCT: 6 %
Neutro Abs: 5 10*3/uL (ref 1.7–7.7)
Neutrophils Relative %: 63 %
PLATELETS: 254 10*3/uL (ref 150–400)
RBC: 4.05 MIL/uL (ref 3.87–5.11)
RDW: 12.3 % (ref 11.5–15.5)
WBC: 7.9 10*3/uL (ref 4.0–10.5)
nRBC: 0 % (ref 0.0–0.2)

## 2018-11-30 ENCOUNTER — Other Ambulatory Visit: Payer: Self-pay | Admitting: Family Medicine

## 2018-11-30 ENCOUNTER — Other Ambulatory Visit
Admission: RE | Admit: 2018-11-30 | Discharge: 2018-11-30 | Disposition: A | Discharge status: Home / Self Care | Payer: 59 | Source: Ambulatory Visit | Attending: Psychiatry | Admitting: Psychiatry

## 2018-11-30 DIAGNOSIS — Z79899 Other long term (current) drug therapy: Secondary | ICD-10-CM | POA: Insufficient documentation

## 2018-11-30 DIAGNOSIS — F259 Schizoaffective disorder, unspecified: Secondary | ICD-10-CM | POA: Diagnosis not present

## 2018-11-30 DIAGNOSIS — E559 Vitamin D deficiency, unspecified: Secondary | ICD-10-CM

## 2018-11-30 DIAGNOSIS — K5909 Other constipation: Secondary | ICD-10-CM

## 2018-11-30 LAB — CBC WITH DIFFERENTIAL/PLATELET
Abs Immature Granulocytes: 0.02 10*3/uL (ref 0.00–0.07)
Basophils Absolute: 0 10*3/uL (ref 0.0–0.1)
Basophils Relative: 1 %
EOS PCT: 4 %
Eosinophils Absolute: 0.4 10*3/uL (ref 0.0–0.5)
HEMATOCRIT: 38.1 % (ref 36.0–46.0)
HEMOGLOBIN: 12.1 g/dL (ref 12.0–15.0)
Immature Granulocytes: 0 %
LYMPHS PCT: 21 %
Lymphs Abs: 1.8 10*3/uL (ref 0.7–4.0)
MCH: 30.4 pg (ref 26.0–34.0)
MCHC: 31.8 g/dL (ref 30.0–36.0)
MCV: 95.7 fL (ref 80.0–100.0)
MONOS PCT: 6 %
Monocytes Absolute: 0.5 10*3/uL (ref 0.1–1.0)
Neutro Abs: 5.8 10*3/uL (ref 1.7–7.7)
Neutrophils Relative %: 68 %
Platelets: 274 10*3/uL (ref 150–400)
RBC: 3.98 MIL/uL (ref 3.87–5.11)
RDW: 12.5 % (ref 11.5–15.5)
WBC: 8.6 10*3/uL (ref 4.0–10.5)
nRBC: 0 % (ref 0.0–0.2)

## 2018-11-30 NOTE — Telephone Encounter (Signed)
Refill request for general medication. Vitamin D   Last office visit 11/02/2018   Follow up on 12/14/2018

## 2018-12-01 NOTE — Telephone Encounter (Signed)
Refill request for general medication: Linzess 145 mcg  Last office visit: 09/02/2018  Last physical exam: 09/02/2018  Follow-ups on file. 12/14/2018

## 2018-12-14 ENCOUNTER — Telehealth: Payer: Self-pay | Admitting: Family Medicine

## 2018-12-14 ENCOUNTER — Ambulatory Visit (INDEPENDENT_AMBULATORY_CARE_PROVIDER_SITE_OTHER): Payer: 59 | Admitting: Family Medicine

## 2018-12-14 ENCOUNTER — Other Ambulatory Visit: Payer: Self-pay | Admitting: Family Medicine

## 2018-12-14 ENCOUNTER — Encounter: Payer: Self-pay | Admitting: Family Medicine

## 2018-12-14 VITALS — BP 128/82 | HR 89 | Temp 97.9°F | Resp 16 | Ht 64.0 in | Wt 141.1 lb

## 2018-12-14 DIAGNOSIS — F250 Schizoaffective disorder, bipolar type: Secondary | ICD-10-CM | POA: Diagnosis not present

## 2018-12-14 DIAGNOSIS — R739 Hyperglycemia, unspecified: Secondary | ICD-10-CM | POA: Diagnosis not present

## 2018-12-14 DIAGNOSIS — E781 Pure hyperglyceridemia: Secondary | ICD-10-CM | POA: Diagnosis not present

## 2018-12-14 DIAGNOSIS — K581 Irritable bowel syndrome with constipation: Secondary | ICD-10-CM

## 2018-12-14 DIAGNOSIS — R000 Tachycardia, unspecified: Secondary | ICD-10-CM

## 2018-12-14 DIAGNOSIS — Z79899 Other long term (current) drug therapy: Secondary | ICD-10-CM

## 2018-12-14 DIAGNOSIS — E559 Vitamin D deficiency, unspecified: Secondary | ICD-10-CM | POA: Diagnosis not present

## 2018-12-14 DIAGNOSIS — B353 Tinea pedis: Secondary | ICD-10-CM

## 2018-12-14 MED ORDER — METOPROLOL TARTRATE 25 MG PO TABS: 12.5000 mg | ORAL_TABLET | Freq: Two times a day (BID) | ORAL | 1 refills | Status: DC

## 2018-12-14 MED ORDER — NAFTIFINE HCL 1 % EX GEL: 1.0000 g | Freq: Every day | CUTANEOUS | 0 refills | Status: DC

## 2018-12-14 NOTE — Progress Notes (Signed)
Name: Carla Thompson   MRN: [id number]    DOB: 08/04/1961   Date:12/14/2018       Progress Note  Subjective  Chief Complaint  Chief Complaint  Patient presents with  . Medication Refill  . Constipation    Has been drinking plenty of fluids-but since last Monday has had dirrhea  . Stress and Urge Incontinence  . Schizophrenia  . Gastroesophageal Reflux    Has been ok   . Nasal Congestion    Wanted Dr. Ancil Boozer to check to see if it is a cold or allergies.  . Foot Ulcer    Left foot and has been picking her scab  . Nausea    Will just be sitting there and get nausea    HPI  Chronic constipation: she isback onLinzess. She has bowel movements every 3-4 days. Bristol scale is type 3 or 4, last one was a 4, very seldom type 1  , not straining, still has occasional soiling. She had a few days with diarrhea, husband thinks secondary to a GI bug but symptoms resolved now  Stress and Urge incontinence: she has noticedoccasionalurgency l it has been chronic,states nocturia has improved, no longer having to void every night. Unchanged  Soiling: she has a history of sphincter surgery for same problem years ago, states only soiling after bowel movements now.   Schizophrenia: Seeing psychiatrist . No longer has hallucinations.She has been cooking and cleaning again. Able to have friends over. Husband is here with her and thinks she is back to baseline. No paranoia or hallucinations.She does not drive because of fear or getting in an accident secondary to medication that she needs to take. They just bought a house and she is very happy about it.   GERD:taking prn tums and is doing well.Unchanged   Tachycardia: doing well on Lopressor half pill twice daily, she denies SOB or chest pain, denies palpitation.  Hypertriglyceridemia: we will recheck labs before his next visit    Patient Active Problem List   Diagnosis Date Noted  . Hyperlipidemia 11/20/2017  . GERD without  esophagitis 02/21/2017  . Tachycardia 02/20/2016  . RLS (restless legs syndrome) 12/21/2015  . IBS (irritable bowel syndrome) 12/21/2015  . Other long term (current) drug therapy 12/08/2015  . Chronic constipation 12/08/2015  . Insomnia, persistent 12/08/2015  . Dermatitis, eczematoid 12/08/2015  . Gravida 2 para 2 12/08/2015  . Hypertriglyceridemia 12/08/2015  . Metallic taste 58/52/7782  . 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 Schizoaffective disorder, bipolar type ([place]) 12/08/2015    Past Surgical History:  Procedure Laterality Date  . ANUS SURGERY  2000   [place]  . TUBAL LIGATION  12/02/1994    Family History  Problem Relation Age of Onset  . Mental illness Sister        Schizophrenia and bipolar  . Cancer Maternal Grandmother        Colon  . Mental illness Paternal Grandmother        Schizophrenia  . Breast cancer Neg Hx     Social History   Socioeconomic History  . Marital status: Married    Spouse name: [name]  . Number of children: 2  . Years of education: Not on file  . Highest education level: Bachelor's degree (e.g., BA, AB, BS)  Occupational History  . Not on file  Social Needs  . Financial resource strain: Not hard at all  . Food insecurity:    Worry: Never true    Inability: Never true  .  Transportation needs:    Medical: No    Non-medical: No  Tobacco Use  . Smoking status: Never Smoker  . Smokeless tobacco: Never Used  Substance and Sexual Activity  . Alcohol use: No    Alcohol/week: 0.0 standard drinks  . Drug use: No  . Sexual activity: Not Currently    Partners: Male  Lifestyle  . Physical activity:    Days per week: 3 days    Minutes per session: 10 min  . Stress: Not at all  Relationships  . Social connections:    Talks on phone: More than three times a week    Gets together: Three times a week    Attends religious service: More than 4 times per year    Active member of club or organization: No    Attends meetings of  clubs or organizations: Never    Relationship status: Married  . Intimate partner violence:    Fear of current or ex partner: No    Emotionally abused: No    Physically abused: No    Forced sexual activity: No  Other Topics Concern  . Not on file  Social History Narrative  . Not on file     Current Outpatient Medications:  .  aspirin (ASPIRIN LOW DOSE) 81 MG EC tablet, Take 1 tablet (81 mg total) by mouth daily. Swallow whole., Disp: 30 tablet, Rfl: 12 .  B Complex-C (SUPER B COMPLEX PO), Take 1 tablet by mouth daily., Disp: , Rfl:  .  CINNAMON PO, Take by mouth., Disp: , Rfl:  .  clonazePAM (KLONOPIN) 0.5 MG tablet, I tablet every morning and 1 1/2 tablets at bedtime., Disp: 75 tablet, Rfl: 5 .  cloZAPine (CLOZARIL) 100 MG tablet, Pt takes one tablet in the morning and three tablets at bedtime., Disp: 120 tablet, Rfl: 6 .  docusate sodium (STOOL SOFTENER) 100 MG capsule, Take 100 mg by mouth daily as needed for mild constipation. , Disp: , Rfl:  .  famotidine (PEPCID) 20 MG tablet, Take 20 mg by mouth 3 (three) times a week. , Disp: , Rfl:  .  lithium 300 MG tablet, Take 2 tablets (600 mg total) by mouth at bedtime., Disp: 180 tablet, Rfl: 0 .  metoprolol tartrate (LOPRESSOR) 25 MG tablet, Take 0.5 tablets (12.5 mg total) by mouth 2 (two) times daily., Disp: 90 tablet, Rfl: 1 .  Pyridoxine HCl (VITAMIN B-6) 500 MG tablet, Take 500 mg by mouth 2 (two) times daily., Disp: , Rfl:  .  vitamin C (ASCORBIC ACID) 500 MG tablet, Take 500 mg by mouth daily., Disp: , Rfl:  .  Vitamin D, Ergocalciferol, (DRISDOL) 1.25 MG (50000 UT) CAPS capsule, TAKE 1 CAPSULE BY MOUTH EVERY 7 DAYS, Disp: 12 capsule, Rfl: 0 .  vitamin E 1000 UNIT capsule, Take 1,000 Units by mouth daily. , Disp: , Rfl:  .  LINZESS 145 MCG CAPS capsule, TAKE 1 CAPSULE(145 MCG) BY MOUTH DAILY (Patient not taking: Reported on 12/14/2018), Disp: 30 capsule, Rfl: 5  Allergies  Allergen Reactions  . Escitalopram Hives  . Levsin  [Hyoscyamine Sulfate] Other (See Comments)    Reaction:  Patient took medication and felt really spacey    I personally reviewed active problem list, medication list, allergies, family history, social history with the patient/caregiver today.   ROS  Constitutional: Negative for fever or significant  weight change.  Respiratory: Negative for cough and shortness of breath.   Cardiovascular: Negative for chest pain or palpitations.  Gastrointestinal:  Negative for abdominal pain, had change in bowel movements  Musculoskeletal: Negative for gait problem or joint swelling.  Skin: Negative for rash.  Neurological: Negative for dizziness or headache.  No other specific complaints in a complete review of systems (except as listed in HPI above).  Objective  Vitals:   12/14/18 1152  BP: 128/82  Pulse: 89  Resp: 16  Temp: 97.9 F (36.6 C)  TempSrc: Oral  SpO2: 99%  Weight: 141 lb 1.6 oz (64 kg)  Height: 5\' 4"  (1.626 m)    Body mass index is 24.22 kg/m.  Physical Exam  Constitutional: Patient appears well-developed and well-nourished. Obese No distress.  HEENT: head atraumatic, normocephalic, pupils equal and reactive to light,  neck supple, throat within normal limits Cardiovascular: Normal rate, regular rhythm and normal heart sounds.  No murmur heard. No BLE edema. Pulmonary/Chest: Effort normal and breath sounds normal. No respiratory distress. Abdominal: Soft.  There is no tenderness. Psychiatric: Patient has a normal mood and affect. behavior is normal. Judgment and thought content normal.  Recent Results (from the past 2160 hour(s))  Lithium level     Status: None   Collection Time: 10/05/18 12:44 PM  Result Value Ref Range   Lithium Lvl 0.69 0.60 - 1.20 mmol/L    Comment: Performed at [facility], [address] [place], [place] [address]  CBC with Differential/Platelet     Status: None   Collection Time: 10/05/18 12:44 PM  Result Value Ref Range   WBC 8.4  4.0 - 10.5 K/uL   RBC 4.10 3.87 - 5.11 MIL/uL   Hemoglobin 12.7 12.0 - 15.0 g/dL   HCT 39.3 36.0 - 46.0 %   MCV 95.9 80.0 - 100.0 fL   MCH 31.0 26.0 - 34.0 pg   MCHC 32.3 30.0 - 36.0 g/dL   RDW 12.2 11.5 - 15.5 %   Platelets 288 150 - 400 K/uL   nRBC 0.0 0.0 - 0.2 %   Neutrophils Relative % 62 %   Neutro Abs 5.2 1.7 - 7.7 K/uL   Lymphocytes Relative 26 %   Lymphs Abs 2.2 0.7 - 4.0 K/uL   Monocytes Relative 7 %   Monocytes Absolute 0.6 0.1 - 1.0 K/uL   Eosinophils Relative 4 %   Eosinophils Absolute 0.3 0.0 - 0.5 K/uL   Basophils Relative 1 %   Basophils Absolute 0.1 0.0 - 0.1 K/uL   Immature Granulocytes 0 %   Abs Immature Granulocytes 0.03 0.00 - 0.07 K/uL    Comment: Performed at [facility], [address] [place],  [address]  CBC with Differential/Platelet     Status: None   Collection Time: 11/03/18 12:05 PM  Result Value Ref Range   WBC 7.9 4.0 - 10.5 K/uL   RBC 4.05 3.87 - 5.11 MIL/uL   Hemoglobin 12.5 12.0 - 15.0 g/dL   HCT 39.3 36.0 - 46.0 %   MCV 97.0 80.0 - 100.0 fL   MCH 30.9 26.0 - 34.0 pg   MCHC 31.8 30.0 - 36.0 g/dL   RDW 12.3 11.5 - 15.5 %   Platelets 254 150 - 400 K/uL   nRBC 0.0 0.0 - 0.2 %   Neutrophils Relative % 63 %   Neutro Abs 5.0 1.7 - 7.7 K/uL   Lymphocytes Relative 25 %   Lymphs Abs 2.0 0.7 - 4.0 K/uL   Monocytes Relative 6 %   Monocytes Absolute 0.4 0.1 - 1.0 K/uL   Eosinophils Relative 5 %  Eosinophils Absolute 0.4 0.0 - 0.5 K/uL   Basophils Relative 1 %   Basophils Absolute 0.1 0.0 - 0.1 K/uL   Immature Granulocytes 0 %   Abs Immature Granulocytes 0.03 0.00 - 0.07 K/uL    Comment: Performed at [facility], [address] [place], [place] [address]  CBC with Differential/Platelet     Status: None   Collection Time: 11/30/18 11:59 AM  Result Value Ref Range   WBC 8.6 4.0 - 10.5 K/uL   RBC 3.98 3.87 - 5.11 MIL/uL   Hemoglobin 12.1 12.0 - 15.0 g/dL   HCT 38.1 36.0 - 46.0 %   MCV 95.7 80.0 - 100.0 fL    MCH 30.4 26.0 - 34.0 pg   MCHC 31.8 30.0 - 36.0 g/dL   RDW 12.5 11.5 - 15.5 %   Platelets 274 150 - 400 K/uL   nRBC 0.0 0.0 - 0.2 %   Neutrophils Relative % 68 %   Neutro Abs 5.8 1.7 - 7.7 K/uL   Lymphocytes Relative 21 %   Lymphs Abs 1.8 0.7 - 4.0 K/uL   Monocytes Relative 6 %   Monocytes Absolute 0.5 0.1 - 1.0 K/uL   Eosinophils Relative 4 %   Eosinophils Absolute 0.4 0.0 - 0.5 K/uL   Basophils Relative 1 %   Basophils Absolute 0.0 0.0 - 0.1 K/uL   Immature Granulocytes 0 %   Abs Immature Granulocytes 0.02 0.00 - 0.07 K/uL    Comment: Performed at [facility], [address] [place], [place] [address]      PHQ2/9: Depression screen [facility] 2/9 12/14/2018 09/02/2018 05/18/2018 08/25/2017 05/22/2016  Decreased Interest 1 0 0 0 0  Down, Depressed, Hopeless 1 0 0 0 1  PHQ - 2 Score 2 0 0 0 1  Altered sleeping 0 0 0 - -  Tired, decreased energy 1 3 1  - -  Change in appetite 0 0 0 - -  Feeling bad or failure about yourself  0 0 0 - -  Trouble concentrating 0 0 0 - -  Moving slowly or fidgety/restless 0 0 1 - -  Suicidal thoughts 0 0 0 - -  PHQ-9 Score 3 3 2  - -  Difficult doing work/chores Somewhat difficult Somewhat difficult - - -     Fall Risk: Fall Risk  12/14/2018 09/02/2018 05/18/2018 11/17/2017 08/25/2017  Falls in the past year? 1 Yes No No No  Comment Saturday October 03, 2018-tripped while walking in the dark at a friend's house - - - -  Number falls in past yr: 0 1 - - -  Comment Contusion on her face July 1st - - -  Injury with Fall? 1 Yes - - -  Comment - right knee - - -  Risk for fall due to : - - - - History of fall(s)    Functional Status Survey: Is the patient deaf or have difficulty hearing?: No Does the patient have difficulty seeing, even when wearing glasses/contacts?: Yes Does the patient have difficulty concentrating, remembering, or making decisions?: Yes Does the patient have difficulty walking or climbing stairs?: No Does the patient have  difficulty dressing or bathing?: No Does the patient have difficulty doing errands alone such as visiting a doctor's office or shopping?: Yes    Assessment & Plan  1. Hypertriglyceridemia  - Lipid panel  2. Hyperglycemia  - Hemoglobin A1c  3. Schizoaffective disorder, bipolar type (HCC)  - Lithium level - clozaril   4. Vitamin D  deficiency  - VITAMIN D 25 Hydroxy (Vit-D Deficiency, Fractures)  5. Long-term use of high-risk medication  - COMPLETE METABOLIC PANEL WITH GFR - CBC with Differential/Platelet - TSH  6. Irritable bowel syndrome with constipation  Resume Linzess, she likely had a GI bug a couple of weeks ago

## 2018-12-14 NOTE — Telephone Encounter (Signed)
Copied from [place] [id number]. Topic: General - Other >> Dec 14, 2018  2:12 PM Lennox Solders wrote: Reason for CRM:pt saw dr Ancil Boozer this morning and she is still waiting on rx for toe fungus to be sent to [facility] in [place]

## 2018-12-28 ENCOUNTER — Other Ambulatory Visit
Admission: RE | Admit: 2018-12-28 | Discharge: 2018-12-28 | Disposition: A | Discharge status: Home / Self Care | Payer: 59 | Attending: Psychiatry | Admitting: Psychiatry

## 2018-12-28 DIAGNOSIS — F259 Schizoaffective disorder, unspecified: Secondary | ICD-10-CM | POA: Diagnosis present

## 2018-12-28 DIAGNOSIS — Z79899 Other long term (current) drug therapy: Secondary | ICD-10-CM | POA: Diagnosis not present

## 2018-12-28 LAB — CBC WITH DIFFERENTIAL/PLATELET
ABS IMMATURE GRANULOCYTES: 0.02 10*3/uL (ref 0.00–0.07)
Basophils Absolute: 0.1 10*3/uL (ref 0.0–0.1)
Basophils Relative: 1 %
Eosinophils Absolute: 0.4 10*3/uL (ref 0.0–0.5)
Eosinophils Relative: 4 %
HEMATOCRIT: 39.9 % (ref 36.0–46.0)
Hemoglobin: 12.6 g/dL (ref 12.0–15.0)
IMMATURE GRANULOCYTES: 0 %
LYMPHS PCT: 23 %
Lymphs Abs: 2.2 10*3/uL (ref 0.7–4.0)
MCH: 30.2 pg (ref 26.0–34.0)
MCHC: 31.6 g/dL (ref 30.0–36.0)
MCV: 95.7 fL (ref 80.0–100.0)
MONOS PCT: 7 %
Monocytes Absolute: 0.6 10*3/uL (ref 0.1–1.0)
NEUTROS ABS: 6.4 10*3/uL (ref 1.7–7.7)
NEUTROS PCT: 65 %
Platelets: 278 10*3/uL (ref 150–400)
RBC: 4.17 MIL/uL (ref 3.87–5.11)
RDW: 12 % (ref 11.5–15.5)
WBC: 9.6 10*3/uL (ref 4.0–10.5)
nRBC: 0 % (ref 0.0–0.2)

## 2019-01-11 ENCOUNTER — Other Ambulatory Visit: Payer: Self-pay | Admitting: Psychiatry

## 2019-01-25 ENCOUNTER — Other Ambulatory Visit
Admission: RE | Admit: 2019-01-25 | Discharge: 2019-01-25 | Disposition: A | Discharge status: Home / Self Care | Payer: 59 | Source: Ambulatory Visit | Attending: Psychiatry | Admitting: Psychiatry

## 2019-01-25 DIAGNOSIS — F259 Schizoaffective disorder, unspecified: Secondary | ICD-10-CM | POA: Insufficient documentation

## 2019-01-25 DIAGNOSIS — Z79899 Other long term (current) drug therapy: Secondary | ICD-10-CM | POA: Insufficient documentation

## 2019-01-25 LAB — CBC WITH DIFFERENTIAL/PLATELET
Abs Immature Granulocytes: 0.02 10*3/uL (ref 0.00–0.07)
BASOS PCT: 1 %
Basophils Absolute: 0.1 10*3/uL (ref 0.0–0.1)
EOS ABS: 0.4 10*3/uL (ref 0.0–0.5)
EOS PCT: 4 %
HCT: 40.2 % (ref 36.0–46.0)
Hemoglobin: 12.9 g/dL (ref 12.0–15.0)
Immature Granulocytes: 0 %
Lymphocytes Relative: 25 %
Lymphs Abs: 2.4 10*3/uL (ref 0.7–4.0)
MCH: 30.4 pg (ref 26.0–34.0)
MCHC: 32.1 g/dL (ref 30.0–36.0)
MCV: 94.6 fL (ref 80.0–100.0)
MONO ABS: 0.6 10*3/uL (ref 0.1–1.0)
Monocytes Relative: 6 %
Neutro Abs: 6.3 10*3/uL (ref 1.7–7.7)
Neutrophils Relative %: 64 %
PLATELETS: 286 10*3/uL (ref 150–400)
RBC: 4.25 MIL/uL (ref 3.87–5.11)
RDW: 12 % (ref 11.5–15.5)
WBC: 9.7 10*3/uL (ref 4.0–10.5)
nRBC: 0 % (ref 0.0–0.2)

## 2019-01-26 ENCOUNTER — Ambulatory Visit: Payer: Self-pay

## 2019-01-26 NOTE — Telephone Encounter (Signed)
She can use saline nasal spray and coricidin hbp

## 2019-01-26 NOTE — Telephone Encounter (Signed)
Pt is wanting advice on what she can take for her runny nose that will not interact with her medications. NT called pharmacy and was on hold, looked up in [employer] med[employer] and up to date application but unable to give advice. Called pt and informed her will contact Dr Ancil Boozer for advice.  Reason for Disposition . Caller has medication question about med not prescribed by PCP and triager unable to answer question (e.g., compatibility with other med, storage)  Answer Assessment - Initial Assessment Questions 1. SYMPTOMS: "Do you have any symptoms?"     Runny nose 2. SEVERITY: If symptoms are present, ask "Are they mild, moderate or severe?"     n/a  Protocols used: MEDICATION QUESTION CALL-A-AH

## 2019-01-27 ENCOUNTER — Telehealth: Payer: Self-pay | Admitting: Psychiatry

## 2019-01-27 NOTE — Telephone Encounter (Signed)
Carla Thompson they are in office box to be faxed to [facility] and her phramacy

## 2019-01-27 NOTE — Telephone Encounter (Signed)
Pt called needing refill on Clonzapine @ [facility]. [place]. Stated we have labs. Waiting for doctor to read and approve. Please advise.

## 2019-01-27 NOTE — Telephone Encounter (Signed)
Patient called.  Patient aware.  

## 2019-02-04 ENCOUNTER — Encounter: Payer: Self-pay | Admitting: Psychiatry

## 2019-02-15 ENCOUNTER — Ambulatory Visit: Payer: Self-pay | Admitting: Family Medicine

## 2019-02-15 NOTE — Telephone Encounter (Signed)
Left message on husband phone to notify them patient Carla Thompson does not qualify for testing at this time.

## 2019-02-15 NOTE — Telephone Encounter (Signed)
She does not qualify to be tested at this time

## 2019-02-15 NOTE — Telephone Encounter (Signed)
I returned her call.   She is c/o having a runny nose without any other symptoms for 2 months.   She said,   "My brother said I should be checked for the coronavirus".    She does not have any to the symptoms when asked.  No travel history, shortness of breath, dry cough or fever. She is requesting not to come in if possible.  She also c/o having a rash on her right foot.   See triage notes.  I have sent these triage notes to Dr. Ancil Boozer for further disposition.  I let the pt know someone would be calling her back.  She was agreeable to this plan.  Reason for Disposition . Causes of nasal allergies (allergens), questions about  Answer Assessment - Initial Assessment Questions 1. ONSET: "When did the nasal discharge start?"      2 months now 2. AMOUNT: "How much discharge is there?"      Only symptoms is a runny nose.   I used saline spray and coricidin HBP.  3. COUGH: "Do you have a cough?" If yes, ask: "Describe the color of your sputum" (clear, white, yellow, green)     No   4. RESPIRATORY DISTRESS: "Describe your breathing."      No 5. FEVER: "Do you have a fever?" If so, ask: "What is your temperature, how was it measured, and when did it start?"     No 6. SEVERITY: "Overall, how bad are you feeling right now?" (e.g., doesn't interfere with normal activities, staying home from school/work, staying in bed)      It doesn't seem to be going away.   My brother wants me tested for coronavirus.    She is not exhibiting symptoms.     7. OTHER SYMPTOMS: "Do you have any other symptoms?" (e.g., sore throat, earache, wheezing, vomiting)     I had a rash on my left foot.   It went away.   Now my right foot has a red rash.   It's not itchy not painful.     It's not going away.   Been there 3-4 weeks.   The medication I used on my left foot Hydrochloride Gel 1% resolved the rash.   It's a prescription.   Dr. Ancil Boozer said rash on left foot was a fungus infection.    The rash is on top of the right  foot.   No swelling.   She is using the Gel 1% on right foot but the rash is not going away like it did on the left foot.   8. PREGNANCY: "Is there any chance you are pregnant?" "When was your last menstrual period?"     Not asked due to age.  Answer Assessment - Initial Assessment Questions 1. SYMPTOM: "What's the main symptom you're concerned about?" (e.g., runny nose, stuffiness, sneezing, itching)     Runny nose without any other symptoms for 2 months.  Maybe it's allergies? 2. SEVERITY: "How bad is it?" "What does it keep you from doing?" (e.g., sleeping, working)      It's just annoying but it doesn't interfere with me doing things. 3. EYES: "Are the eyes also red, watery, and itchy?"      No 4. TRIGGER: "What pollen or other allergic substance do you think is causing the symptoms?"      I don't know.   Maybe all the pollen? 5. TREATMENT: "What medicine are you using?" "What medicine worked best in the past?"  She has been using saline nasal spray and Coricidin HBP.   They don't seem to be helping much.    I don't want to come in if possible. 6. OTHER SYMPTOMS: "Do you have any other symptoms?" (e.g., coughing, difficulty breathing, wheezing)     No other symptoms. 7. PREGNANCY: "Is there any chance you are pregnant?" "When was your last menstrual period?"     Not asked due to age.  Protocols used: NASAL ALLERGIES ([place] FEVER)-A-AH, COMMON COLD-A-AH

## 2019-02-18 ENCOUNTER — Telehealth: Payer: Self-pay | Admitting: Family Medicine

## 2019-02-18 NOTE — Telephone Encounter (Signed)
Called patient in regards to rash on left foot. LVM for her to use otc athlete's foot spray or come in for evaluation.

## 2019-02-18 NOTE — Telephone Encounter (Signed)
Copied from [place] [id number]. Topic: Quick Communication - See Telephone Encounter >> Feb 18, 2019 10:29 AM Rutherford Nail, NT wrote: CRM for notification. See Telephone encounter for: 02/18/19. Patient calling to check the status of having Dr Ancil Boozer advise on the nurse triage encounter from 02/15/2019 about the rash on her foot. Patient states that she had a rash on my left foot that went away.Now her right foot has a red rash. Does not itch and is not painful, but it's not going away.Has been there 3-4 weeks. States the medication she used on her left foot Hydrochloride Gel 1% resolved the rash. States that Dr. Ancil Boozer said rash on left foot was a fungus infection. The rash is on top of the right foot. No swelling. States that she is using the Gel 1% on right foot but the rash is not going away like it did on the left foot. Please advise.

## 2019-02-18 NOTE — Telephone Encounter (Signed)
She can come in to be seen or try otc anti-fungal sprays for athlete's foot for now and see if it resolves

## 2019-02-22 ENCOUNTER — Other Ambulatory Visit
Admission: RE | Admit: 2019-02-22 | Discharge: 2019-02-22 | Disposition: A | Discharge status: Home / Self Care | Payer: 59 | Source: Ambulatory Visit | Attending: Psychiatry | Admitting: Psychiatry

## 2019-02-22 DIAGNOSIS — F250 Schizoaffective disorder, bipolar type: Secondary | ICD-10-CM | POA: Diagnosis not present

## 2019-02-22 LAB — CBC WITH DIFFERENTIAL/PLATELET
ABS IMMATURE GRANULOCYTES: 0.02 10*3/uL (ref 0.00–0.07)
BASOS ABS: 0.1 10*3/uL (ref 0.0–0.1)
Basophils Relative: 1 %
EOS ABS: 0.5 10*3/uL (ref 0.0–0.5)
Eosinophils Relative: 6 %
HEMATOCRIT: 38.6 % (ref 36.0–46.0)
Hemoglobin: 12.1 g/dL (ref 12.0–15.0)
IMMATURE GRANULOCYTES: 0 %
LYMPHS ABS: 1.8 10*3/uL (ref 0.7–4.0)
LYMPHS PCT: 22 %
MCH: 30.4 pg (ref 26.0–34.0)
MCHC: 31.3 g/dL (ref 30.0–36.0)
MCV: 97 fL (ref 80.0–100.0)
Monocytes Absolute: 0.4 10*3/uL (ref 0.1–1.0)
Monocytes Relative: 5 %
NEUTROS ABS: 5.4 10*3/uL (ref 1.7–7.7)
NRBC: 0 % (ref 0.0–0.2)
Neutrophils Relative %: 66 %
PLATELETS: 243 10*3/uL (ref 150–400)
RBC: 3.98 MIL/uL (ref 3.87–5.11)
RDW: 12.4 % (ref 11.5–15.5)
WBC: 8.2 10*3/uL (ref 4.0–10.5)

## 2019-02-25 ENCOUNTER — Other Ambulatory Visit: Payer: Self-pay | Admitting: Family Medicine

## 2019-02-25 DIAGNOSIS — E559 Vitamin D deficiency, unspecified: Secondary | ICD-10-CM

## 2019-02-25 NOTE — Telephone Encounter (Signed)
Refill request for general medication. Vitamin D   Last office visit 12/14/2018  Follow up on 06/21/2019

## 2019-03-02 ENCOUNTER — Other Ambulatory Visit: Payer: Self-pay | Admitting: Psychiatry

## 2019-03-11 ENCOUNTER — Encounter: Payer: Self-pay | Admitting: Psychiatry

## 2019-03-11 ENCOUNTER — Ambulatory Visit (INDEPENDENT_AMBULATORY_CARE_PROVIDER_SITE_OTHER): Payer: 59 | Admitting: Psychiatry

## 2019-03-11 ENCOUNTER — Other Ambulatory Visit: Payer: Self-pay

## 2019-03-11 DIAGNOSIS — F4001 Agoraphobia with panic disorder: Secondary | ICD-10-CM

## 2019-03-11 DIAGNOSIS — F250 Schizoaffective disorder, bipolar type: Secondary | ICD-10-CM

## 2019-03-11 DIAGNOSIS — Z79899 Other long term (current) drug therapy: Secondary | ICD-10-CM

## 2019-03-11 NOTE — Progress Notes (Signed)
Carla Thompson [id number] 02-Jul-1961 58 y.o.  Subjective:   Patient ID:  Carla Thompson is a 58 y.o. (DOB 07/16/1961) female.  Chief Complaint:  Chief Complaint  Patient presents with  . Follow-up    psychosis and med management    HPI Carla Thompson presents to the office today for follow-up of schizoaffective disorder.  Last seen October.  No med changes.  Good overall without complaints.  Trying to stay active including cooking.  No major episodes of confusion nor voices.  She has questions about her diagnosis bc brother had questions about it.  No fear.  Does emails on the computer.  Not much anxiety lately.  Sleep same 9-9.  No fear episodes.  No manic nor depressive episodes.  No significant panic attacks recently.  She is not avoiding things out of anxiety generally.  Oldest daughter married March recent.  Still active in church except for the present because of the Covid virus.  On clozapine for many years with benefit.  Several psych hospitalizations.  She is failed multiple other psychiatric medications which is why she is on clozapine.  She is also failed or relapsed with dosage reduction of clozapine or if she missed clozapine.  She is consistent with her dosage now.   Sister has schizophrenia.  Review of Systems:  Review of Systems  Gastrointestinal: Positive for constipation. Negative for abdominal pain.  Psychiatric/Behavioral: Positive for decreased concentration. Negative for agitation, behavioral problems, confusion, dysphoric mood, hallucinations, self-injury, sleep disturbance and suicidal ideas. The patient is not nervous/anxious and is not hyperactive.   Constipation comes and goes  Medications: I have reviewed the patient's current medications.  Current Outpatient Medications  Medication Sig Dispense Refill  . aspirin (ASPIRIN LOW DOSE) 81 MG EC tablet Take 1 tablet (81 mg total) by mouth daily. Swallow whole. 30 tablet 12  . B Complex-C (SUPER B  COMPLEX PO) Take 1 tablet by mouth daily.    Marland Kitchen CINNAMON PO Take by mouth.    . clonazePAM (KLONOPIN) 0.5 MG tablet TAKE 1 TABLET BY MOUTH EVERY MORNING AND 1 1/2 TABLETS EVERY NIGHT AT BEDTIME 75 tablet 2  . cloZAPine (CLOZARIL) 100 MG tablet Pt takes one tablet in the morning and three tablets at bedtime. 120 tablet 6  . docusate sodium (STOOL SOFTENER) 100 MG capsule Take 100 mg by mouth daily as needed for mild constipation.     . famotidine (PEPCID) 20 MG tablet Take 20 mg by mouth 3 (three) times a week.     . lithium 300 MG tablet TAKE 2 TABLETS(600 MG) BY MOUTH AT BEDTIME 180 tablet 0  . metoprolol tartrate (LOPRESSOR) 25 MG tablet Take 0.5 tablets (12.5 mg total) by mouth 2 (two) times daily. 90 tablet 1  . Pyridoxine HCl (VITAMIN B-6) 500 MG tablet Take 500 mg by mouth 2 (two) times daily.    . vitamin C (ASCORBIC ACID) 500 MG tablet Take 500 mg by mouth daily.    . Vitamin D, Ergocalciferol, (DRISDOL) 1.25 MG (50000 UT) CAPS capsule TAKE 1 CAPSULE BY MOUTH EVERY 7 DAYS 12 capsule 0  . vitamin E 1000 UNIT capsule Take 1,000 Units by mouth daily.     Marland Kitchen LINZESS 145 MCG CAPS capsule TAKE 1 CAPSULE(145 MCG) BY MOUTH DAILY (Patient not taking: Reported on 12/14/2018) 30 capsule 5  . Naftifine HCl 1 % GEL Apply 1 g topically daily. 30 g 0   No current facility-administered medications for this visit.  Medication Side Effects: Other: alternating contstipation and diarrhea but has IBS  Allergies:  Allergies  Allergen Reactions  . Escitalopram Hives  . Levsin [Hyoscyamine Sulfate] Other (See Comments)    Reaction:  Patient took medication and felt really spacey    Past Medical History:  Diagnosis Date  . Abnormal perimenopausal bleeding   . Bowel incontinence   . Chronic constipation   . Dermatitis   . High risk medication use   . Hyperglycemia   . Hypertriglyceridemia   . Iron deficiency anemia due to chronic blood loss    secondary to heavy flow  . Metallic taste   .  Schizoaffective disorder, bipolar type ([place])    Dr. Tyrone Sage    Family History  Problem Relation Age of Onset  . Mental illness Sister        Schizophrenia and bipolar  . Cancer Maternal Grandmother        Colon  . Mental illness Paternal Grandmother        Schizophrenia  . Breast cancer Neg Hx     Social History   Socioeconomic History  . Marital status: Married    Spouse name: [name]  . Number of children: 2  . Years of education: Not on file  . Highest education level: Bachelor's degree (e.g., BA, AB, BS)  Occupational History  . Not on file  Social Needs  . Financial resource strain: Not hard at all  . Food insecurity:    Worry: Never true    Inability: Never true  . Transportation needs:    Medical: No    Non-medical: No  Tobacco Use  . Smoking status: Never Smoker  . Smokeless tobacco: Never Used  Substance and Sexual Activity  . Alcohol use: No    Alcohol/week: 0.0 standard drinks  . Drug use: No  . Sexual activity: Not Currently    Partners: Male  Lifestyle  . Physical activity:    Days per week: 3 days    Minutes per session: 10 min  . Stress: Not at all  Relationships  . Social connections:    Talks on phone: More than three times a week    Gets together: Three times a week    Attends religious service: More than 4 times per year    Active member of club or organization: No    Attends meetings of clubs or organizations: Never    Relationship status: Married  . Intimate partner violence:    Fear of current or ex partner: No    Emotionally abused: No    Physically abused: No    Forced sexual activity: No  Other Topics Concern  . Not on file  Social History Narrative  . Not on file    Past Medical History, Surgical history, Social history, and Family history were reviewed and updated as appropriate.   Please see review of systems for further details on the patient's review from today.   Objective:   Physical Exam:  LMP  12/16/2016 (Approximate)   Physical Exam Neurological:     Mental Status: She is alert and oriented to person, place, and time.     Cranial Nerves: No dysarthria.  Psychiatric:        Attention and Perception: She is inattentive. She does not perceive auditory or visual hallucinations.        Mood and Affect: Mood is not anxious or depressed. Affect is not labile, blunt or tearful.        Speech: Speech  is tangential. Speech is not rapid and pressured or slurred.        Behavior: Behavior is cooperative.        Thought Content: Thought content normal. Thought content is not paranoid or delusional. Thought content does not include homicidal or suicidal ideation. Thought content does not include homicidal or suicidal plan.        Cognition and Memory: Cognition and memory normal.        Judgment: Judgment normal.     Comments: Insight fair. Scattered thought chronically with some hyperverbal and overinclusiveness.      Lab Review:     Component Value Date/Time   NA 139 05/18/2018 1305   NA 140 01/18/2016 1418   NA 139 08/27/2013 0930   K 4.1 05/18/2018 1305   K 4.2 08/27/2013 0930   CL 105 05/18/2018 1305   CL 108 (H) 08/27/2013 0930   CO2 25 05/18/2018 1305   CO2 28 08/27/2013 0930   GLUCOSE 105 (H) 05/18/2018 1305   GLUCOSE 98 08/27/2013 0930   BUN 14 05/18/2018 1305   BUN 17 01/18/2016 1418   BUN 11 08/27/2013 0930   CREATININE 0.89 05/18/2018 1305   CREATININE 0.95 11/18/2017 0922   CALCIUM 9.1 05/18/2018 1305   CALCIUM 8.5 08/27/2013 0930   PROT 6.9 05/18/2018 1305   PROT 6.6 01/18/2016 1418   PROT 7.2 08/27/2013 0930   ALBUMIN 4.2 05/18/2018 1305   ALBUMIN 4.4 01/18/2016 1418   ALBUMIN 3.5 08/27/2013 0930   AST 24 05/18/2018 1305   AST 20 08/27/2013 0930   ALT 19 05/18/2018 1305   ALT 19 08/27/2013 0930   ALKPHOS 74 05/18/2018 1305   ALKPHOS 107 08/27/2013 0930   BILITOT 0.6 05/18/2018 1305   BILITOT <0.2 01/18/2016 1418   BILITOT 0.3 08/27/2013 0930    GFRNONAA >60 05/18/2018 1305   GFRNONAA 67 11/18/2017 0922   GFRAA >60 05/18/2018 1305   GFRAA 78 11/18/2017 0922       Component Value Date/Time   WBC 8.2 02/22/2019 1049   RBC 3.98 02/22/2019 1049   HGB 12.1 02/22/2019 1049   HGB 12.3 01/23/2016 1231   HCT 38.6 02/22/2019 1049   HCT 38.6 01/23/2016 1231   PLT 243 02/22/2019 1049   PLT 280 01/23/2016 1231   MCV 97.0 02/22/2019 1049   MCV 95 01/23/2016 1231   MCV 92 03/24/2015 1244   MCH 30.4 02/22/2019 1049   MCHC 31.3 02/22/2019 1049   RDW 12.4 02/22/2019 1049   RDW 13.7 01/23/2016 1231   RDW 13.1 03/24/2015 1244   LYMPHSABS 1.8 02/22/2019 1049   LYMPHSABS 2.4 01/23/2016 1231   LYMPHSABS 2.2 03/24/2015 1244   MONOABS 0.4 02/22/2019 1049   MONOABS 0.4 03/24/2015 1244   EOSABS 0.5 02/22/2019 1049   EOSABS 0.1 01/23/2016 1231   EOSABS 0.4 03/24/2015 1244   BASOSABS 0.1 02/22/2019 1049   BASOSABS 0.0 01/23/2016 1231   BASOSABS 0.0 03/24/2015 1244    Lithium Lvl  Date Value Ref Range Status  10/05/2018 0.69 0.60 - 1.20 mmol/L Final    Comment:    Performed at [facility], [address], [place], [place] [address]     Lab Results  Component Value Date   VALPROATE 102 (H) 12/26/2015     .res Assessment: Plan:    Schizoaffective disorder, bipolar type ([place])  Panic disorder with agoraphobia   Answered her questions about the difference between schizoaffective disorder, bipolar disorder and schizophrenia and her diagnosis.  Disc differences between types of schizophrenia per her request.  Answered her questions about the family history of psychotic disorders in the genetics involved.  Discussed these things in detail per her request.  Overall her schizoaffective disorder is under control.  She does not have the manic drivenness hyperactivity and rapid speech that she has had in the past.  She does not have the level of confusion and obsessiveness and rumination that she has had with psychotic episodes.   She does have a baseline level of scattered thinking and hyperverbal speech but it is directable.  She is at baseline.  Panic disorder controlled.  Good response to meds.  No med changes indicated.  Lithium level stable.  ANC stable. Continue clozapine.  Continue lab test per usual.  This appt was 30 mins.  FU 6 mos  I connected with patient by a video enabled telemedicine application or telephone, with their informed consent, and verified patient privacy and that I am speaking with the correct person using two identifiers.  I was located at office and patient at home.  Lynder Parents, MD, DFAPA   Please see After Visit Summary for patient specific instructions.  Future Appointments  Date Time Provider [place]  06/21/2019 11:40 AM Steele Sizer, MD [facility]    No orders of the defined types were placed in this encounter.     -------------------------------

## 2019-03-13 ENCOUNTER — Other Ambulatory Visit: Payer: Self-pay | Admitting: Family Medicine

## 2019-03-13 DIAGNOSIS — R000 Tachycardia, unspecified: Secondary | ICD-10-CM

## 2019-03-24 ENCOUNTER — Telehealth: Payer: Self-pay

## 2019-03-24 LAB — VITAMIN D 25 HYDROXY (VIT D DEFICIENCY, FRACTURES): Vit D, 25-Hydroxy: 46 ng/mL (ref 30–100)

## 2019-03-24 LAB — HEMOGLOBIN A1C
EAG (MMOL/L): 5.5 (calc)
Hgb A1c MFr Bld: 5.1 % of total Hgb (ref <5.7)
Mean Plasma Glucose: 100 (calc)

## 2019-03-24 LAB — COMPLETE METABOLIC PANEL WITH GFR
AG RATIO: 1.9 (calc) (ref 1.0–2.5)
ALBUMIN MSPROF: 4.1 g/dL (ref 3.6–5.1)
ALT: 13 U/L (ref 6–29)
AST: 15 U/L (ref 10–35)
Alkaline phosphatase (APISO): 96 U/L (ref 37–153)
BUN: 12 mg/dL (ref 7–25)
CALCIUM: 9.4 mg/dL (ref 8.6–10.4)
CO2: 30 mmol/L (ref 20–32)
Chloride: 104 mmol/L (ref 98–110)
Creat: 0.89 mg/dL (ref 0.50–1.05)
GFR, EST AFRICAN AMERICAN: 84 mL/min/{1.73_m2} (ref >=60)
GFR, EST NON AFRICAN AMERICAN: 72 mL/min/{1.73_m2} (ref >=60)
Globulin: 2.2 g/dL (calc) (ref 1.9–3.7)
Glucose, Bld: 99 mg/dL (ref 65–99)
POTASSIUM: 4.1 mmol/L (ref 3.5–5.3)
Sodium: 139 mmol/L (ref 135–146)
TOTAL PROTEIN: 6.3 g/dL (ref 6.1–8.1)
Total Bilirubin: 0.4 mg/dL (ref 0.2–1.2)

## 2019-03-24 LAB — LIPID PANEL
CHOL/HDL RATIO: 4.3 (calc) (ref <5.0)
CHOLESTEROL: 236 mg/dL — AB (ref <200)
HDL: 55 mg/dL (ref >=50)
LDL CHOLESTEROL (CALC): 142 mg/dL — AB
Non-HDL Cholesterol (Calc): 181 mg/dL (calc) — ABNORMAL HIGH (ref <130)
TRIGLYCERIDES: 253 mg/dL — AB (ref <150)

## 2019-03-24 LAB — CBC WITH DIFFERENTIAL/PLATELET
Absolute Monocytes: 390 cells/uL (ref 200–950)
BASOS ABS: 68 {cells}/uL (ref 0–200)
Basophils Relative: 0.9 %
EOS ABS: 368 {cells}/uL (ref 15–500)
Eosinophils Relative: 4.9 %
HEMATOCRIT: 36.9 % (ref 35.0–45.0)
HEMOGLOBIN: 12.3 g/dL (ref 11.7–15.5)
LYMPHS ABS: 1875 {cells}/uL (ref 850–3900)
MCH: 30.5 pg (ref 27.0–33.0)
MCHC: 33.3 g/dL (ref 32.0–36.0)
MCV: 91.6 fL (ref 80.0–100.0)
MPV: 10.9 fL (ref 7.5–12.5)
Monocytes Relative: 5.2 %
NEUTROS ABS: 4800 {cells}/uL (ref 1500–7800)
NEUTROS PCT: 64 %
Platelets: 267 10*3/uL (ref 140–400)
RBC: 4.03 10*6/uL (ref 3.80–5.10)
RDW: 12 % (ref 11.0–15.0)
Total Lymphocyte: 25 %
WBC: 7.5 10*3/uL (ref 3.8–10.8)

## 2019-03-24 LAB — CLOZAPINE (CLOZARIL)
Clozapine Lvl: 800 mcg/L
NorClozapine: 574 mcg/L — ABNORMAL HIGH (ref 25–400)

## 2019-03-24 LAB — LITHIUM LEVEL: Lithium Lvl: 0.7 mmol/L (ref 0.6–1.2)

## 2019-03-24 LAB — TSH: TSH: 2.65 mIU/L (ref 0.40–4.50)

## 2019-03-24 NOTE — Telephone Encounter (Signed)
Copied from [place] [id number]. Topic: Quick Communication - Lab Results (Clinic Use ONLY) >> Mar 24, 2019  1:21 PM Lennox Solders wrote: Pt is calling and would like blood work results from [employer]  Please review and sign off on labs

## 2019-03-26 ENCOUNTER — Other Ambulatory Visit: Payer: Self-pay | Admitting: Psychiatry

## 2019-04-01 NOTE — Progress Notes (Signed)
Clozapine levels are highly variable and the established norms are not is scientifically validated as are the norms for example lithium.  Her clozapine level is good and she needs the current dosage.  No further clozapine levels are necessary.  She requires monthly CBC with differential as long as she remains on clozapine.  Lynder Parents MD, DFAPA

## 2019-04-19 ENCOUNTER — Other Ambulatory Visit
Admission: RE | Admit: 2019-04-19 | Discharge: 2019-04-19 | Disposition: A | Discharge status: Home / Self Care | Payer: 59 | Attending: Psychiatry | Admitting: Psychiatry

## 2019-04-19 DIAGNOSIS — F259 Schizoaffective disorder, unspecified: Secondary | ICD-10-CM | POA: Diagnosis present

## 2019-04-19 DIAGNOSIS — Z79899 Other long term (current) drug therapy: Secondary | ICD-10-CM | POA: Diagnosis not present

## 2019-04-19 DIAGNOSIS — F250 Schizoaffective disorder, bipolar type: Secondary | ICD-10-CM

## 2019-04-19 LAB — CBC WITH DIFFERENTIAL/PLATELET
Abs Immature Granulocytes: 0.02 10*3/uL (ref 0.00–0.07)
Basophils Absolute: 0.1 10*3/uL (ref 0.0–0.1)
Basophils Relative: 1 %
Eosinophils Absolute: 0.4 10*3/uL (ref 0.0–0.5)
Eosinophils Relative: 4 %
HCT: 40 % (ref 36.0–46.0)
Hemoglobin: 12.5 g/dL (ref 12.0–15.0)
Immature Granulocytes: 0 %
Lymphocytes Relative: 26 %
Lymphs Abs: 2.3 10*3/uL (ref 0.7–4.0)
MCH: 30.3 pg (ref 26.0–34.0)
MCHC: 31.3 g/dL (ref 30.0–36.0)
MCV: 97.1 fL (ref 80.0–100.0)
Monocytes Absolute: 0.5 10*3/uL (ref 0.1–1.0)
Monocytes Relative: 5 %
Neutro Abs: 5.8 10*3/uL (ref 1.7–7.7)
Neutrophils Relative %: 64 %
Platelets: 262 10*3/uL (ref 150–400)
RBC: 4.12 MIL/uL (ref 3.87–5.11)
RDW: 12.4 % (ref 11.5–15.5)
WBC: 9 10*3/uL (ref 4.0–10.5)
nRBC: 0 % (ref 0.0–0.2)

## 2019-04-20 ENCOUNTER — Telehealth: Payer: Self-pay | Admitting: Psychiatry

## 2019-04-20 ENCOUNTER — Other Ambulatory Visit: Payer: Self-pay | Admitting: Psychiatry

## 2019-04-20 DIAGNOSIS — F250 Schizoaffective disorder, bipolar type: Secondary | ICD-10-CM

## 2019-04-20 NOTE — Telephone Encounter (Signed)
rx was submitted today

## 2019-04-20 NOTE — Telephone Encounter (Signed)
Pt called to request refill on Clozapine 100 mg at [facility].

## 2019-05-19 ENCOUNTER — Other Ambulatory Visit
Admission: RE | Admit: 2019-05-19 | Discharge: 2019-05-19 | Disposition: A | Discharge status: Home / Self Care | Payer: 59 | Source: Ambulatory Visit | Attending: Psychiatry | Admitting: Psychiatry

## 2019-05-19 ENCOUNTER — Telehealth: Payer: Self-pay | Admitting: Psychiatry

## 2019-05-19 DIAGNOSIS — Z79899 Other long term (current) drug therapy: Secondary | ICD-10-CM | POA: Insufficient documentation

## 2019-05-19 DIAGNOSIS — F259 Schizoaffective disorder, unspecified: Secondary | ICD-10-CM | POA: Diagnosis present

## 2019-05-19 LAB — CBC WITH DIFFERENTIAL/PLATELET
Abs Immature Granulocytes: 0.02 10*3/uL (ref 0.00–0.07)
Basophils Absolute: 0.1 10*3/uL (ref 0.0–0.1)
Basophils Relative: 1 %
Eosinophils Absolute: 0.3 10*3/uL (ref 0.0–0.5)
Eosinophils Relative: 4 %
HCT: 40.4 % (ref 36.0–46.0)
Hemoglobin: 12.8 g/dL (ref 12.0–15.0)
Immature Granulocytes: 0 %
Lymphocytes Relative: 25 %
Lymphs Abs: 2 10*3/uL (ref 0.7–4.0)
MCH: 30.5 pg (ref 26.0–34.0)
MCHC: 31.7 g/dL (ref 30.0–36.0)
MCV: 96.2 fL (ref 80.0–100.0)
Monocytes Absolute: 0.5 10*3/uL (ref 0.1–1.0)
Monocytes Relative: 6 %
Neutro Abs: 5 10*3/uL (ref 1.7–7.7)
Neutrophils Relative %: 64 %
Platelets: 271 10*3/uL (ref 150–400)
RBC: 4.2 MIL/uL (ref 3.87–5.11)
RDW: 12.2 % (ref 11.5–15.5)
WBC: 7.9 10*3/uL (ref 4.0–10.5)
nRBC: 0 % (ref 0.0–0.2)

## 2019-05-19 NOTE — Telephone Encounter (Signed)
Patient called to check to see if we received labs which we did but patient wants to know if medicine can be sent

## 2019-05-20 ENCOUNTER — Telehealth: Payer: Self-pay | Admitting: Psychiatry

## 2019-05-20 NOTE — Telephone Encounter (Signed)
Patient left vm today @9 :37 wanted to know if chart was reviewed regarding lab results, if so will you send the results to [facility]

## 2019-05-20 NOTE — Telephone Encounter (Signed)
I signed the labs and put them in the office box yesterday at 430.  Please make sure they were faxed to [facility]. I do not do this in epic because it is very difficult to do it in epic

## 2019-05-25 ENCOUNTER — Other Ambulatory Visit: Payer: Self-pay | Admitting: Psychiatry

## 2019-05-25 ENCOUNTER — Other Ambulatory Visit: Payer: Self-pay | Admitting: Family Medicine

## 2019-05-25 DIAGNOSIS — K5909 Other constipation: Secondary | ICD-10-CM

## 2019-06-15 ENCOUNTER — Other Ambulatory Visit
Admission: RE | Admit: 2019-06-15 | Discharge: 2019-06-15 | Disposition: A | Discharge status: Home / Self Care | Payer: 59 | Source: Ambulatory Visit | Attending: Psychiatry | Admitting: Psychiatry

## 2019-06-15 ENCOUNTER — Other Ambulatory Visit: Payer: Self-pay

## 2019-06-15 DIAGNOSIS — F250 Schizoaffective disorder, bipolar type: Secondary | ICD-10-CM | POA: Diagnosis present

## 2019-06-15 LAB — CBC WITH DIFFERENTIAL/PLATELET
Abs Immature Granulocytes: 0.03 10*3/uL (ref 0.00–0.07)
Basophils Absolute: 0.1 10*3/uL (ref 0.0–0.1)
Basophils Relative: 1 %
Eosinophils Absolute: 0.2 10*3/uL (ref 0.0–0.5)
Eosinophils Relative: 3 %
HCT: 39.4 % (ref 36.0–46.0)
Hemoglobin: 12.4 g/dL (ref 12.0–15.0)
Immature Granulocytes: 0 %
Lymphocytes Relative: 22 %
Lymphs Abs: 1.9 10*3/uL (ref 0.7–4.0)
MCH: 30.7 pg (ref 26.0–34.0)
MCHC: 31.5 g/dL (ref 30.0–36.0)
MCV: 97.5 fL (ref 80.0–100.0)
Monocytes Absolute: 0.6 10*3/uL (ref 0.1–1.0)
Monocytes Relative: 7 %
Neutro Abs: 5.6 10*3/uL (ref 1.7–7.7)
Neutrophils Relative %: 67 %
Platelets: 263 10*3/uL (ref 150–400)
RBC: 4.04 MIL/uL (ref 3.87–5.11)
RDW: 12.2 % (ref 11.5–15.5)
WBC: 8.3 10*3/uL (ref 4.0–10.5)
nRBC: 0 % (ref 0.0–0.2)

## 2019-06-16 ENCOUNTER — Telehealth: Payer: Self-pay | Admitting: Psychiatry

## 2019-06-16 NOTE — Telephone Encounter (Signed)
Carla Thompson called to see if we had her lab results and could we get them faxed in to her pharmacy and [facility]  So she can get her clozapine.  Since CC is not here to review them, can someone else do it.

## 2019-06-21 ENCOUNTER — Other Ambulatory Visit: Payer: Self-pay

## 2019-06-21 ENCOUNTER — Ambulatory Visit (INDEPENDENT_AMBULATORY_CARE_PROVIDER_SITE_OTHER): Payer: 59 | Admitting: Family Medicine

## 2019-06-21 ENCOUNTER — Encounter: Payer: Self-pay | Admitting: Family Medicine

## 2019-06-21 VITALS — BP 100/70 | HR 95 | Temp 97.5°F | Resp 16 | Ht 64.0 in | Wt 139.4 lb

## 2019-06-21 DIAGNOSIS — K5909 Other constipation: Secondary | ICD-10-CM

## 2019-06-21 DIAGNOSIS — K219 Gastro-esophageal reflux disease without esophagitis: Secondary | ICD-10-CM

## 2019-06-21 DIAGNOSIS — R000 Tachycardia, unspecified: Secondary | ICD-10-CM

## 2019-06-21 DIAGNOSIS — F250 Schizoaffective disorder, bipolar type: Secondary | ICD-10-CM

## 2019-06-21 DIAGNOSIS — N3946 Mixed incontinence: Secondary | ICD-10-CM

## 2019-06-21 DIAGNOSIS — E781 Pure hyperglyceridemia: Secondary | ICD-10-CM | POA: Diagnosis not present

## 2019-06-21 MED ORDER — METOPROLOL TARTRATE 25 MG PO TABS: ORAL_TABLET | ORAL | 1 refills | Status: DC

## 2019-06-21 NOTE — Progress Notes (Signed)
Name: Carla Thompson   MRN: [id number]    DOB: 13-Jan-1961   Date:06/21/2019       Progress Note  Subjective  Chief Complaint  Chief Complaint  Patient presents with  . Hyperlipidemia  . Medication Refill  . Hyperglycemia    HPI   Chronic constipation: she isback onLinzess. She has bowel movements every 2.3 days. Bristol scale is type usually a 4,  not straining, still has occasional soiling ( she has a history of sphincter surgery ).  Stress and Urge incontinence: she has noticedoccasionalurgency l it has been chronic,states nocturia has improved, no longer having to void every night.   Schizophrenia: Seeing psychiatrist, Dr. Clovis Pu  . No longer has hallucinations.She has been cooking and cleaning again. Able to have friends over. Husband is here with her and thinks she is back to baseline. No paranoia or hallucinations.She does not drive because of fear or getting in an accident secondary to medication that she needs to take. Last CBC done recently She is now only seeing him every 6 months and she is very happy about it   GERD:taking prn tums. She has noticed weird taste in her mouth, no heartburn.   Tachycardia: doing well on Lopressor half pill twice daily, she denies SOB or chest pain, denies palpitation. Unchanged   Hypertriglyceridemia:  The 10-year ASCVD risk score Mikey Bussing DC Jr., et al., 2013) is: 1.6%   Values used to calculate the score:     Age: 58 years     Sex: Female     Is Non-Hispanic African American: No     Diabetic: No     Tobacco smoker: No     Systolic Blood Pressure: 932 mmHg     Is BP treated: No     HDL Cholesterol: 55 mg/dL     Total Cholesterol: 236 mg/dL    Patient Active Problem List   Diagnosis Date Noted  . Hyperlipidemia 11/20/2017  . GERD without esophagitis 02/21/2017  . Tachycardia 02/20/2016  . RLS (restless legs syndrome) 12/21/2015  . IBS (irritable bowel syndrome) 12/21/2015  . Other long term (current) drug therapy  12/08/2015  . Chronic constipation 12/08/2015  . Insomnia, persistent 12/08/2015  . Dermatitis, eczematoid 12/08/2015  . Gravida 2 para 2 12/08/2015  . Hypertriglyceridemia 12/08/2015  . Metallic taste 67/11/4579  . 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 Schizoaffective disorder, bipolar type ([place]) 12/08/2015    Past Surgical History:  Procedure Laterality Date  . ANUS SURGERY  2000   [place]  . TUBAL LIGATION  12/02/1994    Family History  Problem Relation Age of Onset  . Mental illness Sister        Schizophrenia and bipolar  . Cancer Maternal Grandmother        Colon  . Mental illness Paternal Grandmother        Schizophrenia  . Breast cancer Neg Hx     Social History   Socioeconomic History  . Marital status: Married    Spouse name: [name]  . Number of children: 2  . Years of education: Not on file  . Highest education level: Bachelor's degree (e.g., BA, AB, BS)  Occupational History  . Not on file  Social Needs  . Financial resource strain: Not hard at all  . Food insecurity    Worry: Never true    Inability: Never true  . Transportation needs    Medical: No    Non-medical: No  Tobacco Use  . Smoking status: Never  Smoker  . Smokeless tobacco: Never Used  Substance and Sexual Activity  . Alcohol use: No    Alcohol/week: 0.0 standard drinks  . Drug use: No  . Sexual activity: Not Currently    Partners: Male  Lifestyle  . Physical activity    Days per week: 3 days    Minutes per session: 10 min  . Stress: Not at all  Relationships  . Social connections    Talks on phone: More than three times a week    Gets together: Three times a week    Attends religious service: More than 4 times per year    Active member of club or organization: No    Attends meetings of clubs or organizations: Never    Relationship status: Married  . Intimate partner violence    Fear of current or ex partner: No    Emotionally abused: No    Physically abused: No     Forced sexual activity: No  Other Topics Concern  . Not on file  Social History Narrative  . Not on file     Current Outpatient Medications:  .  aspirin (ASPIRIN LOW DOSE) 81 MG EC tablet, Take 1 tablet (81 mg total) by mouth daily. Swallow whole., Disp: 30 tablet, Rfl: 12 .  B Complex-C (SUPER B COMPLEX PO), Take 1 tablet by mouth daily., Disp: , Rfl:  .  CINNAMON PO, Take by mouth., Disp: , Rfl:  .  clonazePAM (KLONOPIN) 0.5 MG tablet, TAKE 1 TABLET BY MOUTH EVERY MORNING, AND 1 AND 1/2 TABLETS AT BEDTIME, Disp: 75 tablet, Rfl: 5 .  cloZAPine (CLOZARIL) 100 MG tablet, TAKE 1 TABLET BY MOUTH EVERY MORNING AND 3 TABLETS BY MOUTH EVERY NIGHT AT BEDTIME, Disp: 120 tablet, Rfl: 6 .  docusate sodium (STOOL SOFTENER) 100 MG capsule, Take 100 mg by mouth daily as needed for mild constipation. , Disp: , Rfl:  .  famotidine (PEPCID) 20 MG tablet, Take 20 mg by mouth 3 (three) times a week. , Disp: , Rfl:  .  LINZESS 145 MCG CAPS capsule, TAKE 1 CAPSULE(145 MCG) BY MOUTH DAILY, Disp: 30 capsule, Rfl: 5 .  lithium 300 MG tablet, TAKE 2 TABLETS(600 MG) BY MOUTH AT BEDTIME, Disp: 180 tablet, Rfl: 1 .  metoprolol tartrate (LOPRESSOR) 25 MG tablet, TAKE 1/2 TABLET(12.5 MG) BY MOUTH TWICE DAILY, Disp: 90 tablet, Rfl: 0 .  Naftifine HCl 1 % GEL, Apply 1 g topically daily., Disp: 30 g, Rfl: 0 .  Pyridoxine HCl (VITAMIN B-6) 500 MG tablet, Take 500 mg by mouth 2 (two) times daily., Disp: , Rfl:  .  vitamin C (ASCORBIC ACID) 500 MG tablet, Take 500 mg by mouth daily., Disp: , Rfl:  .  Vitamin D, Ergocalciferol, (DRISDOL) 1.25 MG (50000 UT) CAPS capsule, TAKE 1 CAPSULE BY MOUTH EVERY 7 DAYS, Disp: 12 capsule, Rfl: 0 .  vitamin E 1000 UNIT capsule, Take 1,000 Units by mouth daily. , Disp: , Rfl:   Allergies  Allergen Reactions  . Escitalopram Hives  . Levsin [Hyoscyamine Sulfate] Other (See Comments)    Reaction:  Patient took medication and felt really spacey    I personally reviewed active problem list,  medication list, allergies, family history, social history with the patient/caregiver today.   ROS  Constitutional: Negative for fever or weight change.  Respiratory: Negative for cough and shortness of breath.   Cardiovascular: Negative for chest pain or palpitations.  Gastrointestinal: Negative for abdominal pain, no bowel changes.  Musculoskeletal:  Negative for gait problem or joint swelling.  Skin: Negative for rash.  Neurological: Negative for dizziness or headache.  No other specific complaints in a complete review of systems (except as listed in HPI above).  Objective  Vitals:   06/21/19 1151  BP: 100/70  Pulse: 95  Resp: 16  Temp: (!) 97.5 F (36.4 C)  TempSrc: Oral  SpO2: 98%  Weight: 139 lb 6.4 oz (63.2 kg)  Height: 5\' 4"  (1.626 m)    Body mass index is 23.93 kg/m.  Physical Exam  Constitutional: Patient appears well-developed and well-nourished. A little pale   No distress.  HEENT: head atraumatic, normocephalic, pupils equal and reactive to light,  neck supple Cardiovascular: Normal rate, regular rhythm and normal heart sounds.  No murmur heard. No BLE edema. Pulmonary/Chest: Effort normal and breath sounds normal. No respiratory distress. Abdominal: Soft.  There is no tenderness. Psychiatric: Patient has a normal mood and affect. behavior is normal. Judgment and thought content normal.  Recent Results (from the past 2160 hour(s))  CBC with Differential/Platelet     Status: None   Collection Time: 04/19/19 10:08 AM  Result Value Ref Range   WBC 9.0 4.0 - 10.5 K/uL   RBC 4.12 3.87 - 5.11 MIL/uL   Hemoglobin 12.5 12.0 - 15.0 g/dL   HCT 40.0 36.0 - 46.0 %   MCV 97.1 80.0 - 100.0 fL   MCH 30.3 26.0 - 34.0 pg   MCHC 31.3 30.0 - 36.0 g/dL   RDW 12.4 11.5 - 15.5 %   Platelets 262 150 - 400 K/uL   nRBC 0.0 0.0 - 0.2 %   Neutrophils Relative % 64 %   Neutro Abs 5.8 1.7 - 7.7 K/uL   Lymphocytes Relative 26 %   Lymphs Abs 2.3 0.7 - 4.0 K/uL   Monocytes  Relative 5 %   Monocytes Absolute 0.5 0.1 - 1.0 K/uL   Eosinophils Relative 4 %   Eosinophils Absolute 0.4 0.0 - 0.5 K/uL   Basophils Relative 1 %   Basophils Absolute 0.1 0.0 - 0.1 K/uL   Immature Granulocytes 0 %   Abs Immature Granulocytes 0.02 0.00 - 0.07 K/uL    Comment: Performed at [facility], [address] [place], [place] [address]  CBC with Differential/Platelet     Status: None   Collection Time: 05/19/19  1:14 PM  Result Value Ref Range   WBC 7.9 4.0 - 10.5 K/uL   RBC 4.20 3.87 - 5.11 MIL/uL   Hemoglobin 12.8 12.0 - 15.0 g/dL   HCT 40.4 36.0 - 46.0 %   MCV 96.2 80.0 - 100.0 fL   MCH 30.5 26.0 - 34.0 pg   MCHC 31.7 30.0 - 36.0 g/dL   RDW 12.2 11.5 - 15.5 %   Platelets 271 150 - 400 K/uL   nRBC 0.0 0.0 - 0.2 %   Neutrophils Relative % 64 %   Neutro Abs 5.0 1.7 - 7.7 K/uL   Lymphocytes Relative 25 %   Lymphs Abs 2.0 0.7 - 4.0 K/uL   Monocytes Relative 6 %   Monocytes Absolute 0.5 0.1 - 1.0 K/uL   Eosinophils Relative 4 %   Eosinophils Absolute 0.3 0.0 - 0.5 K/uL   Basophils Relative 1 %   Basophils Absolute 0.1 0.0 - 0.1 K/uL   Immature Granulocytes 0 %   Abs Immature Granulocytes 0.02 0.00 - 0.07 K/uL    Comment: Performed at [facility], [address] [place], [place] [address]  CBC with Differential/Platelet     Status: None   Collection Time: 06/15/19 12:54 PM  Result Value Ref Range   WBC 8.3 4.0 - 10.5 K/uL   RBC 4.04 3.87 - 5.11 MIL/uL   Hemoglobin 12.4 12.0 - 15.0 g/dL   HCT 39.4 36.0 - 46.0 %   MCV 97.5 80.0 - 100.0 fL   MCH 30.7 26.0 - 34.0 pg   MCHC 31.5 30.0 - 36.0 g/dL   RDW 12.2 11.5 - 15.5 %   Platelets 263 150 - 400 K/uL   nRBC 0.0 0.0 - 0.2 %   Neutrophils Relative % 67 %   Neutro Abs 5.6 1.7 - 7.7 K/uL   Lymphocytes Relative 22 %   Lymphs Abs 1.9 0.7 - 4.0 K/uL   Monocytes Relative 7 %   Monocytes Absolute 0.6 0.1 - 1.0 K/uL   Eosinophils Relative 3 %   Eosinophils Absolute 0.2 0.0 - 0.5 K/uL   Basophils  Relative 1 %   Basophils Absolute 0.1 0.0 - 0.1 K/uL   Immature Granulocytes 0 %   Abs Immature Granulocytes 0.03 0.00 - 0.07 K/uL    Comment: Performed at [facility], [address] [place], [place] [address]      PHQ2/9: Depression screen [facility] 2/9 06/21/2019 12/14/2018 09/02/2018 05/18/2018 08/25/2017  Decreased Interest 0 1 0 0 0  Down, Depressed, Hopeless 0 1 0 0 0  PHQ - 2 Score 0 2 0 0 0  Altered sleeping 0 0 0 0 -  Tired, decreased energy 0 1 3 1  -  Change in appetite 0 0 0 0 -  Feeling bad or failure about yourself  0 0 0 0 -  Trouble concentrating 0 0 0 0 -  Moving slowly or fidgety/restless 0 0 0 1 -  Suicidal thoughts 0 0 0 0 -  PHQ-9 Score 0 3 3 2  -  Difficult doing work/chores - Somewhat difficult Somewhat difficult - -    phq 9 is negative   Fall Risk: Fall Risk  06/21/2019 12/14/2018 09/02/2018 05/18/2018 11/17/2017  Falls in the past year? 1 1 Yes No No  Comment - Saturday October 03, 2018-tripped while walking in the dark at a friend's house - - -  Number falls in past yr: 0 0 1 - -  Comment - Contusion on her face July 1st - -  Injury with Fall? 0 1 Yes - -  Comment - - right knee - -  Risk for fall due to : - - - - -     Functional Status Survey: Is the patient deaf or have difficulty hearing?: No Does the patient have difficulty seeing, even when wearing glasses/contacts?: No Does the patient have difficulty concentrating, remembering, or making decisions?: Yes Does the patient have difficulty walking or climbing stairs?: No Does the patient have difficulty dressing or bathing?: No Does the patient have difficulty doing errands alone such as visiting a doctor's office or shopping?: No   Assessment & Plan  1. Hypertriglyceridemia  She has changed her diet  2. Schizoaffective disorder, bipolar type [facility])  Doing well, still seeing Dr. Clovis Pu   3. GERD without esophagitis  Discussed medication H2 blocker since she has been having weird  taste in her mouth but she wants to hold off on anything else now   4. Chronic constipation  Stable   5. Mixed incontinence urge and stress  Unchanged   6. Tachycardia  - metoprolol tartrate (LOPRESSOR) 25 MG tablet; TAKE  1/2 TABLET(12.5 MG) BY MOUTH TWICE DAILY  Dispense: 90 tablet; Refill: 1

## 2019-07-13 ENCOUNTER — Other Ambulatory Visit
Admission: RE | Admit: 2019-07-13 | Discharge: 2019-07-13 | Disposition: A | Discharge status: Home / Self Care | Payer: 59 | Source: Ambulatory Visit | Attending: Psychiatry | Admitting: Psychiatry

## 2019-07-13 DIAGNOSIS — F250 Schizoaffective disorder, bipolar type: Secondary | ICD-10-CM | POA: Insufficient documentation

## 2019-07-13 LAB — CBC WITH DIFFERENTIAL/PLATELET
Abs Immature Granulocytes: 0.02 10*3/uL (ref 0.00–0.07)
Basophils Absolute: 0.1 10*3/uL (ref 0.0–0.1)
Basophils Relative: 1 %
Eosinophils Absolute: 0.3 10*3/uL (ref 0.0–0.5)
Eosinophils Relative: 4 %
HCT: 38.7 % (ref 36.0–46.0)
Hemoglobin: 12.2 g/dL (ref 12.0–15.0)
Immature Granulocytes: 0 %
Lymphocytes Relative: 24 %
Lymphs Abs: 1.9 10*3/uL (ref 0.7–4.0)
MCH: 30.7 pg (ref 26.0–34.0)
MCHC: 31.5 g/dL (ref 30.0–36.0)
MCV: 97.5 fL (ref 80.0–100.0)
Monocytes Absolute: 0.5 10*3/uL (ref 0.1–1.0)
Monocytes Relative: 6 %
Neutro Abs: 5.2 10*3/uL (ref 1.7–7.7)
Neutrophils Relative %: 65 %
Platelets: 257 10*3/uL (ref 150–400)
RBC: 3.97 MIL/uL (ref 3.87–5.11)
RDW: 12.1 % (ref 11.5–15.5)
WBC: 8.1 10*3/uL (ref 4.0–10.5)
nRBC: 0 % (ref 0.0–0.2)

## 2019-07-16 ENCOUNTER — Other Ambulatory Visit: Payer: Self-pay | Admitting: Family Medicine

## 2019-07-16 DIAGNOSIS — E559 Vitamin D deficiency, unspecified: Secondary | ICD-10-CM

## 2019-07-21 ENCOUNTER — Other Ambulatory Visit: Payer: Self-pay | Admitting: Family Medicine

## 2019-07-21 DIAGNOSIS — E559 Vitamin D deficiency, unspecified: Secondary | ICD-10-CM

## 2019-07-21 MED ORDER — VITAMIN D (ERGOCALCIFEROL) 1.25 MG (50000 UNIT) PO CAPS: ORAL_CAPSULE | ORAL | 0 refills | Status: DC

## 2019-07-21 NOTE — Telephone Encounter (Signed)
Medication Refill - Medication:  Vitamin D, Ergocalciferol, (DRISDOL) 1.25 MG (50000 UT)  Has the patient contacted their pharmacy? Yes advised to call PCP.  Preferred Pharmacy (with phone number or street name):  [id number] [address], [place] [phone] (Phone) [phone] (Fax)   Agent: Please be advised that RX refills may take up to 3 business days. We ask that you follow-up with your pharmacy.

## 2019-08-06 ENCOUNTER — Other Ambulatory Visit
Admission: RE | Admit: 2019-08-06 | Discharge: 2019-08-06 | Disposition: A | Discharge status: Home / Self Care | Payer: 59 | Attending: Psychiatry | Admitting: Psychiatry

## 2019-08-06 DIAGNOSIS — F250 Schizoaffective disorder, bipolar type: Secondary | ICD-10-CM | POA: Diagnosis not present

## 2019-08-06 LAB — CBC WITH DIFFERENTIAL/PLATELET
Abs Immature Granulocytes: 0.03 10*3/uL (ref 0.00–0.07)
Basophils Absolute: 0 10*3/uL (ref 0.0–0.1)
Basophils Relative: 1 %
Eosinophils Absolute: 0.3 10*3/uL (ref 0.0–0.5)
Eosinophils Relative: 4 %
HCT: 38.9 % (ref 36.0–46.0)
Hemoglobin: 12.5 g/dL (ref 12.0–15.0)
Immature Granulocytes: 0 %
Lymphocytes Relative: 24 %
Lymphs Abs: 1.9 10*3/uL (ref 0.7–4.0)
MCH: 30.6 pg (ref 26.0–34.0)
MCHC: 32.1 g/dL (ref 30.0–36.0)
MCV: 95.3 fL (ref 80.0–100.0)
Monocytes Absolute: 0.5 10*3/uL (ref 0.1–1.0)
Monocytes Relative: 7 %
Neutro Abs: 5 10*3/uL (ref 1.7–7.7)
Neutrophils Relative %: 64 %
Platelets: 252 10*3/uL (ref 150–400)
RBC: 4.08 MIL/uL (ref 3.87–5.11)
RDW: 12 % (ref 11.5–15.5)
WBC: 7.7 10*3/uL (ref 4.0–10.5)
nRBC: 0 % (ref 0.0–0.2)

## 2019-08-11 ENCOUNTER — Other Ambulatory Visit: Payer: Self-pay | Admitting: Family Medicine

## 2019-08-11 DIAGNOSIS — Z1231 Encounter for screening mammogram for malignant neoplasm of breast: Secondary | ICD-10-CM

## 2019-09-06 ENCOUNTER — Other Ambulatory Visit
Admission: RE | Admit: 2019-09-06 | Discharge: 2019-09-06 | Disposition: A | Discharge status: Home / Self Care | Payer: 59 | Source: Ambulatory Visit | Attending: Psychiatry | Admitting: Psychiatry

## 2019-09-06 DIAGNOSIS — F250 Schizoaffective disorder, bipolar type: Secondary | ICD-10-CM | POA: Diagnosis present

## 2019-09-06 LAB — CBC WITH DIFFERENTIAL/PLATELET
Abs Immature Granulocytes: 0.02 10*3/uL (ref 0.00–0.07)
Basophils Absolute: 0.1 10*3/uL (ref 0.0–0.1)
Basophils Relative: 1 %
Eosinophils Absolute: 0.3 10*3/uL (ref 0.0–0.5)
Eosinophils Relative: 3 %
HCT: 38.9 % (ref 36.0–46.0)
Hemoglobin: 12.4 g/dL (ref 12.0–15.0)
Immature Granulocytes: 0 %
Lymphocytes Relative: 25 %
Lymphs Abs: 1.9 10*3/uL (ref 0.7–4.0)
MCH: 30.7 pg (ref 26.0–34.0)
MCHC: 31.9 g/dL (ref 30.0–36.0)
MCV: 96.3 fL (ref 80.0–100.0)
Monocytes Absolute: 0.5 10*3/uL (ref 0.1–1.0)
Monocytes Relative: 7 %
Neutro Abs: 4.9 10*3/uL (ref 1.7–7.7)
Neutrophils Relative %: 64 %
Platelets: 255 10*3/uL (ref 150–400)
RBC: 4.04 MIL/uL (ref 3.87–5.11)
RDW: 12.1 % (ref 11.5–15.5)
WBC: 7.7 10*3/uL (ref 4.0–10.5)
nRBC: 0 % (ref 0.0–0.2)

## 2019-09-06 NOTE — Addendum Note (Signed)
Addended by: Santiago Bur on: 09/06/2019 11:46 AM   Modules accepted: Orders

## 2019-09-09 ENCOUNTER — Other Ambulatory Visit: Payer: Self-pay

## 2019-09-09 ENCOUNTER — Encounter: Payer: Self-pay | Admitting: Psychiatry

## 2019-09-09 ENCOUNTER — Ambulatory Visit (INDEPENDENT_AMBULATORY_CARE_PROVIDER_SITE_OTHER): Payer: 59 | Admitting: Psychiatry

## 2019-09-09 DIAGNOSIS — F250 Schizoaffective disorder, bipolar type: Secondary | ICD-10-CM | POA: Diagnosis not present

## 2019-09-09 DIAGNOSIS — Z79899 Other long term (current) drug therapy: Secondary | ICD-10-CM

## 2019-09-09 DIAGNOSIS — F4001 Agoraphobia with panic disorder: Secondary | ICD-10-CM | POA: Diagnosis not present

## 2019-09-09 DIAGNOSIS — K5909 Other constipation: Secondary | ICD-10-CM

## 2019-09-09 MED ORDER — CLONAZEPAM 0.5 MG PO TABS: ORAL_TABLET | ORAL | 5 refills | Status: DC

## 2019-09-09 MED ORDER — CLOZAPINE 100 MG PO TABS: ORAL_TABLET | ORAL | 6 refills | Status: DC

## 2019-09-09 NOTE — Progress Notes (Signed)
CORNIE BOSCH [id number] 1961/03/03 58 y.o.  Subjective:   Patient ID:  Carla Thompson is a 58 y.o. (DOB 1961-01-02) female.  Chief Complaint:  Chief Complaint  Patient presents with  . Follow-up    Schizoaffective disorder    HPI Carla Thompson presents to the office today for follow-up of schizoaffective disorder.  Last visit March 11, 2019.  No med changes.  She continued on clozapine 400, lithium 600, and clonazepam 0.5 twice daily as needed.  Good overall without complaints.  Stress dealing with Covid.  Trying to stay active including cooking.  No major episodes of confusion nor voices.    No fear.  Does emails on the computer.  Not much anxiety lately.  Sleep same 9-9.  No fear episodes.  No manic nor depressive episodes.  No significant panic attacks recently.  She is not avoiding things out of anxiety generally.  She's still active at church.    Oldest daughter married March.  On clozapine for many years with benefit.  Several psych hospitalizations.  She is failed multiple other psychiatric medications which is why she is on clozapine.  She is also failed or relapsed with dosage reduction of clozapine or if she missed clozapine.  She is consistent with her dosage now.  Large infrequent BM clog toilet and not well managed.  Past Psychiatric Medication Trials: Citrucil, Miralax, stool softener, Mg tablets  Sister has schizophrenia.  Review of Systems:  Review of Systems  Gastrointestinal: Positive for constipation. Negative for abdominal pain.  Psychiatric/Behavioral: Positive for decreased concentration. Negative for agitation, behavioral problems, confusion, dysphoric mood, hallucinations, self-injury, sleep disturbance and suicidal ideas. The patient is not nervous/anxious and is not hyperactive.   Constipation comes and goes  Medications: I have reviewed the patient's current medications.  Current Outpatient Medications  Medication Sig Dispense Refill  .  aspirin (ASPIRIN LOW DOSE) 81 MG EC tablet Take 1 tablet (81 mg total) by mouth daily. Swallow whole. 30 tablet 12  . B Complex-C (SUPER B COMPLEX PO) Take 1 tablet by mouth daily.    Marland Kitchen CINNAMON PO Take by mouth.    . clonazePAM (KLONOPIN) 0.5 MG tablet TAKE 1 TABLET BY MOUTH EVERY MORNING, AND 1 AND 1/2 TABLETS AT BEDTIME 75 tablet 5  . cloZAPine (CLOZARIL) 100 MG tablet TAKE 1 TABLET BY MOUTH EVERY MORNING AND 3 TABLETS BY MOUTH EVERY NIGHT AT BEDTIME 120 tablet 6  . docusate sodium (STOOL SOFTENER) 100 MG capsule Take 100 mg by mouth daily as needed for mild constipation.     . famotidine (PEPCID) 20 MG tablet Take 20 mg by mouth 3 (three) times a week.     Marland Kitchen LINZESS 145 MCG CAPS capsule TAKE 1 CAPSULE(145 MCG) BY MOUTH DAILY 30 capsule 5  . lithium 300 MG tablet TAKE 2 TABLETS(600 MG) BY MOUTH AT BEDTIME 180 tablet 1  . metoprolol tartrate (LOPRESSOR) 25 MG tablet TAKE 1/2 TABLET(12.5 MG) BY MOUTH TWICE DAILY 90 tablet 1  . Naftifine HCl 1 % GEL Apply 1 g topically daily. 30 g 0  . Pyridoxine HCl (VITAMIN B-6) 500 MG tablet Take 500 mg by mouth 2 (two) times daily.    . vitamin C (ASCORBIC ACID) 500 MG tablet Take 500 mg by mouth daily.    . Vitamin D, Ergocalciferol, (DRISDOL) 1.25 MG (50000 UT) CAPS capsule TAKE 1 CAPSULE BY MOUTH EVERY 7 DAYS 12 capsule 0  . vitamin E 1000 UNIT capsule Take 1,000 Units by mouth daily.  No current facility-administered medications for this visit.     Medication Side Effects: Other: alternating contstipation and diarrhea but has IBS  Allergies:  Allergies  Allergen Reactions  . Escitalopram Hives  . Levsin [Hyoscyamine Sulfate] Other (See Comments)    Reaction:  Patient took medication and felt really spacey    Past Medical History:  Diagnosis Date  . Abnormal perimenopausal bleeding   . Bowel incontinence   . Chronic constipation   . Dermatitis   . High risk medication use   . Hyperglycemia   . Hypertriglyceridemia   . Iron deficiency  anemia due to chronic blood loss    secondary to heavy flow  . Metallic taste   . Schizoaffective disorder, bipolar type ([place])    Dr. Tyrone Sage    Family History  Problem Relation Age of Onset  . Mental illness Sister        Schizophrenia and bipolar  . Cancer Maternal Grandmother        Colon  . Mental illness Paternal Grandmother        Schizophrenia  . Breast cancer Neg Hx     Social History   Socioeconomic History  . Marital status: Married    Spouse name: [name]  . Number of children: 2  . Years of education: Not on file  . Highest education level: Bachelor's degree (e.g., BA, AB, BS)  Occupational History  . Not on file  Social Needs  . Financial resource strain: Not hard at all  . Food insecurity    Worry: Never true    Inability: Never true  . Transportation needs    Medical: No    Non-medical: No  Tobacco Use  . Smoking status: Never Smoker  . Smokeless tobacco: Never Used  Substance and Sexual Activity  . Alcohol use: No    Alcohol/week: 0.0 standard drinks  . Drug use: No  . Sexual activity: Not Currently    Partners: Male  Lifestyle  . Physical activity    Days per week: 3 days    Minutes per session: 10 min  . Stress: Not at all  Relationships  . Social connections    Talks on phone: More than three times a week    Gets together: Three times a week    Attends religious service: More than 4 times per year    Active member of club or organization: No    Attends meetings of clubs or organizations: Never    Relationship status: Married  . Intimate partner violence    Fear of current or ex partner: No    Emotionally abused: No    Physically abused: No    Forced sexual activity: No  Other Topics Concern  . Not on file  Social History Narrative  . Not on file    Past Medical History, Surgical history, Social history, and Family history were reviewed and updated as appropriate.   Please see review of systems for further details on  the patient's review from today.   Objective:   Physical Exam:  LMP 12/16/2016 (Approximate)   Physical Exam Constitutional:      General: She is not in acute distress.    Appearance: She is well-developed.  Musculoskeletal:        General: No deformity.  Neurological:     Mental Status: She is alert and oriented to person, place, and time.     Cranial Nerves: No dysarthria.     Coordination: Coordination normal.  Psychiatric:  Attention and Perception: Perception normal. She is inattentive. She does not perceive auditory or visual hallucinations.        Mood and Affect: Mood normal. Mood is not anxious or depressed. Affect is not labile, blunt, angry, tearful or inappropriate.        Speech: Speech is tangential. Speech is not rapid and pressured or slurred.        Behavior: Behavior normal. Behavior is cooperative.        Thought Content: Thought content normal. Thought content is not paranoid or delusional. Thought content does not include homicidal or suicidal ideation. Thought content does not include homicidal or suicidal plan.        Cognition and Memory: Cognition and memory normal.        Judgment: Judgment normal.     Comments: Insight fair. Scattered thought chronically with some hyperverbal and overinclusiveness.  No psychotic features today otherwise except loose thought.      Lab Review:     Component Value Date/Time   NA 139 03/22/2019 1022   NA 140 01/18/2016 1418   NA 139 08/27/2013 0930   K 4.1 03/22/2019 1022   K 4.2 08/27/2013 0930   CL 104 03/22/2019 1022   CL 108 (H) 08/27/2013 0930   CO2 30 03/22/2019 1022   CO2 28 08/27/2013 0930   GLUCOSE 99 03/22/2019 1022   GLUCOSE 98 08/27/2013 0930   BUN 12 03/22/2019 1022   BUN 17 01/18/2016 1418   BUN 11 08/27/2013 0930   CREATININE 0.89 03/22/2019 1022   CALCIUM 9.4 03/22/2019 1022   CALCIUM 8.5 08/27/2013 0930   PROT 6.3 03/22/2019 1022   PROT 6.6 01/18/2016 1418   PROT 7.2 08/27/2013 0930    ALBUMIN 4.2 05/18/2018 1305   ALBUMIN 4.4 01/18/2016 1418   ALBUMIN 3.5 08/27/2013 0930   AST 15 03/22/2019 1022   AST 20 08/27/2013 0930   ALT 13 03/22/2019 1022   ALT 19 08/27/2013 0930   ALKPHOS 74 05/18/2018 1305   ALKPHOS 107 08/27/2013 0930   BILITOT 0.4 03/22/2019 1022   BILITOT <0.2 01/18/2016 1418   BILITOT 0.3 08/27/2013 0930   GFRNONAA 72 03/22/2019 1022   GFRAA 84 03/22/2019 1022       Component Value Date/Time   WBC 7.7 09/06/2019 1159   RBC 4.04 09/06/2019 1159   HGB 12.4 09/06/2019 1159   HGB 12.3 01/23/2016 1231   HCT 38.9 09/06/2019 1159   HCT 38.6 01/23/2016 1231   PLT 255 09/06/2019 1159   PLT 280 01/23/2016 1231   MCV 96.3 09/06/2019 1159   MCV 95 01/23/2016 1231   MCV 92 03/24/2015 1244   MCH 30.7 09/06/2019 1159   MCHC 31.9 09/06/2019 1159   RDW 12.1 09/06/2019 1159   RDW 13.7 01/23/2016 1231   RDW 13.1 03/24/2015 1244   LYMPHSABS 1.9 09/06/2019 1159   LYMPHSABS 2.4 01/23/2016 1231   LYMPHSABS 2.2 03/24/2015 1244   MONOABS 0.5 09/06/2019 1159   MONOABS 0.4 03/24/2015 1244   EOSABS 0.3 09/06/2019 1159   EOSABS 0.1 01/23/2016 1231   EOSABS 0.4 03/24/2015 1244   BASOSABS 0.1 09/06/2019 1159   BASOSABS 0.0 01/23/2016 1231   BASOSABS 0.0 03/24/2015 1244    Lithium Lvl  Date Value Ref Range Status  03/22/2019 0.7 0.6 - 1.2 mmol/L Final     Lab Results  Component Value Date   VALPROATE 102 (H) 12/26/2015     .res Assessment: Plan:    Schizoaffective disorder, bipolar type (  [place]) - Plan: cloZAPine (CLOZARIL) 100 MG tablet  Panic disorder with agoraphobia - Plan: clonazePAM (KLONOPIN) 0.5 MG tablet  Long term current use of clozapine  Chronic constipation   Overall her schizoaffective disorder is under control.  She does not have the manic drivenness hyperactivity and rapid speech that she has had in the past.  She does not have the level of confusion and obsessiveness and rumination that she has had with psychotic episodes.  She does  have a baseline level of scattered thinking and hyperverbal speech but it is directable.  She is at baseline same as the last visit.  Not self aware about overinclusiveness.  Panic disorder controlled.  Good response to meds. Disc dealing with sleepiness with the medication.   No med changes indicated.  Lithium level stable.  ANC stable the last 6 months. Continue clozapine.  Continue lab test per usual.  Discussed the risks and benefits of perhaps schedule ruling out the clozapine levels a little less frequently per the current protocol of the [facility]apine clinic at [facility].    Still problems with large infrequent q 4-7 days, large BM.  Often clogs the toilet.  Not well managed.  Switch from mg tablets to Mg Citrate 1/2 bottle daily and titrate to effect.  This appt was 30 mins.  FU 6 mos  Lynder Parents, MD, DFAPA   Please see After Visit Summary for patient specific instructions.  Future Appointments  Date Time Provider Britt  09/15/2019  4:20 PM [facility]  12/24/2019 11:20 AM Sowles, Drue Stager, MD [facility]    No orders of the defined types were placed in this encounter.     -------------------------------

## 2019-09-13 ENCOUNTER — Telehealth: Payer: Self-pay | Admitting: Psychiatry

## 2019-09-13 NOTE — Telephone Encounter (Signed)
Patient stated she was told to take magnesium citrate and she needs to know how much to take, she has a 10 oz. Bottle, please advise

## 2019-09-13 NOTE — Telephone Encounter (Signed)
Discussed with patient instructions were to take 1/2 bottle of mag citrate to get more regulated and have smaller bowel movements. Pt did discuss the issues of her bowel movements, reassured her and advised to call back with anymore concerns or questions.

## 2019-09-15 ENCOUNTER — Ambulatory Visit
Admission: RE | Admit: 2019-09-15 | Discharge: 2019-09-15 | Disposition: A | Discharge status: Home / Self Care | Payer: 59 | Source: Ambulatory Visit | Attending: Family Medicine | Admitting: Family Medicine

## 2019-09-15 DIAGNOSIS — Z1231 Encounter for screening mammogram for malignant neoplasm of breast: Secondary | ICD-10-CM | POA: Diagnosis present

## 2019-09-15 NOTE — Telephone Encounter (Signed)
Carla Thompson called back to report that the magnesium was making her sick.  She realizes it has only been 1 day but should she continue and how many days should she take it? Please call.  If she doesn't answer, please leave messgae

## 2019-09-15 NOTE — Telephone Encounter (Signed)
If the magnesium is making her sick suggest that she reduce the quantity to one quarter of a bottle and take it with her evening meal.

## 2019-09-17 NOTE — Telephone Encounter (Signed)
Carla Thompson advised patient to reduce her mag citrate and patient verified instructions. Advised to call back with further issues.

## 2019-09-24 ENCOUNTER — Other Ambulatory Visit: Payer: Self-pay | Admitting: Psychiatry

## 2019-09-24 DIAGNOSIS — F4001 Agoraphobia with panic disorder: Secondary | ICD-10-CM

## 2019-09-30 ENCOUNTER — Other Ambulatory Visit
Admission: RE | Admit: 2019-09-30 | Discharge: 2019-09-30 | Disposition: A | Discharge status: Home / Self Care | Payer: 59 | Source: Ambulatory Visit | Attending: Psychiatry | Admitting: Psychiatry

## 2019-09-30 DIAGNOSIS — F250 Schizoaffective disorder, bipolar type: Secondary | ICD-10-CM | POA: Diagnosis not present

## 2019-09-30 LAB — CBC WITH DIFFERENTIAL/PLATELET
Abs Immature Granulocytes: 0.02 10*3/uL (ref 0.00–0.07)
Basophils Absolute: 0 10*3/uL (ref 0.0–0.1)
Basophils Relative: 0 %
Eosinophils Absolute: 0.2 10*3/uL (ref 0.0–0.5)
Eosinophils Relative: 3 %
HCT: 39.1 % (ref 36.0–46.0)
Hemoglobin: 12.3 g/dL (ref 12.0–15.0)
Immature Granulocytes: 0 %
Lymphocytes Relative: 24 %
Lymphs Abs: 2 10*3/uL (ref 0.7–4.0)
MCH: 30.2 pg (ref 26.0–34.0)
MCHC: 31.5 g/dL (ref 30.0–36.0)
MCV: 96.1 fL (ref 80.0–100.0)
Monocytes Absolute: 0.5 10*3/uL (ref 0.1–1.0)
Monocytes Relative: 6 %
Neutro Abs: 5.5 10*3/uL (ref 1.7–7.7)
Neutrophils Relative %: 67 %
Platelets: 252 10*3/uL (ref 150–400)
RBC: 4.07 MIL/uL (ref 3.87–5.11)
RDW: 12.2 % (ref 11.5–15.5)
WBC: 8.2 10*3/uL (ref 4.0–10.5)
nRBC: 0 % (ref 0.0–0.2)

## 2019-10-19 ENCOUNTER — Other Ambulatory Visit: Payer: Self-pay | Admitting: Family Medicine

## 2019-10-19 DIAGNOSIS — E559 Vitamin D deficiency, unspecified: Secondary | ICD-10-CM

## 2019-10-19 NOTE — Telephone Encounter (Signed)
Requested medication (s) are due for refill today: yes  Requested medication (s) are on the active medication list:yes  Last refill: 07/21/2019  12  0 refills  Future visit scheduled yes  Notes to clinic:not delegated  Requested Prescriptions  Pending Prescriptions Disp Refills   Vitamin D, Ergocalciferol, (DRISDOL) 1.25 MG (50000 UT) CAPS capsule [Pharmacy Med Name: VITAMIN D2 50,000IU (ERGO) CAP RX] 12 capsule 0    Sig: TAKE 1 CAPSULE BY MOUTH EVERY 7 DAYS     Endocrinology:  Vitamins - Vitamin D Supplementation Failed - 10/19/2019  5:13 PM      Failed - 50,000 IU strengths are not delegated      Failed - Phosphate in normal range and within 360 days    No results found for: PHOS       Passed - Ca in normal range and within 360 days    Calcium  Date Value Ref Range Status  03/22/2019 9.4 8.6 - 10.4 mg/dL Final   Calcium, Total  Date Value Ref Range Status  08/27/2013 8.5 8.5 - 10.1 mg/dL Final         Passed - Vitamin D in normal range and within 360 days    Vit D, 25-Hydroxy  Date Value Ref Range Status  03/22/2019 46 30 - 100 ng/mL Final    Comment:    Vitamin D Status         25-OH Vitamin D: . Deficiency:                    <20 ng/mL Insufficiency:             20 - 29 ng/mL Optimal:                 > or = 30 ng/mL . For 25-OH Vitamin D testing on patients on  D2-supplementation and patients for whom quantitation  of D2 and D3 fractions is required, the QuestAssureD(TM) 25-OH VIT D, (D2,D3), LC/MS/MS is recommended: order  code [id number] (patients >74yrs). . For more information on this test, go to: http://education.questdiagnostics.com/faq/FAQ163 (This link is being provided for  informational/educational purposes only.)          Passed - Valid encounter within last 12 months    Recent Outpatient Visits          4 months ago Hypertriglyceridemia   [facility] Steele Sizer, MD   10 months ago Hypertriglyceridemia   [facility] Steele Sizer, MD   1 year ago Well woman exam   [facility] Steele Sizer, MD   1 year ago Chronic constipation   [facility] Steele Sizer, MD   1 year ago Hypertriglyceridemia   [facility] Steele Sizer, MD      Future Appointments            In 2 months Ancil Boozer, Drue Stager, MD [facility], [facility]

## 2019-11-01 ENCOUNTER — Other Ambulatory Visit
Admission: RE | Admit: 2019-11-01 | Discharge: 2019-11-01 | Disposition: A | Discharge status: Home / Self Care | Payer: 59 | Source: Ambulatory Visit | Attending: Psychiatry | Admitting: Psychiatry

## 2019-11-01 DIAGNOSIS — F250 Schizoaffective disorder, bipolar type: Secondary | ICD-10-CM | POA: Diagnosis present

## 2019-11-01 LAB — CBC WITH DIFFERENTIAL/PLATELET
Abs Immature Granulocytes: 0.03 10*3/uL (ref 0.00–0.07)
Basophils Absolute: 0.1 10*3/uL (ref 0.0–0.1)
Basophils Relative: 1 %
Eosinophils Absolute: 0.3 10*3/uL (ref 0.0–0.5)
Eosinophils Relative: 3 %
HCT: 38.5 % (ref 36.0–46.0)
Hemoglobin: 12.4 g/dL (ref 12.0–15.0)
Immature Granulocytes: 0 %
Lymphocytes Relative: 22 %
Lymphs Abs: 1.9 10*3/uL (ref 0.7–4.0)
MCH: 30.7 pg (ref 26.0–34.0)
MCHC: 32.2 g/dL (ref 30.0–36.0)
MCV: 95.3 fL (ref 80.0–100.0)
Monocytes Absolute: 0.5 10*3/uL (ref 0.1–1.0)
Monocytes Relative: 6 %
Neutro Abs: 5.9 10*3/uL (ref 1.7–7.7)
Neutrophils Relative %: 68 %
Platelets: 266 10*3/uL (ref 150–400)
RBC: 4.04 MIL/uL (ref 3.87–5.11)
RDW: 12.2 % (ref 11.5–15.5)
WBC: 8.7 10*3/uL (ref 4.0–10.5)
nRBC: 0 % (ref 0.0–0.2)

## 2019-11-03 ENCOUNTER — Other Ambulatory Visit: Payer: Self-pay | Admitting: Psychiatry

## 2019-11-03 DIAGNOSIS — F250 Schizoaffective disorder, bipolar type: Secondary | ICD-10-CM

## 2019-11-22 ENCOUNTER — Ambulatory Visit: Payer: 59 | Attending: Internal Medicine

## 2019-11-22 DIAGNOSIS — Z20822 Contact with and (suspected) exposure to covid-19: Secondary | ICD-10-CM

## 2019-11-23 ENCOUNTER — Other Ambulatory Visit: Payer: Self-pay | Admitting: Psychiatry

## 2019-11-23 ENCOUNTER — Other Ambulatory Visit: Payer: Self-pay | Admitting: Family Medicine

## 2019-11-23 DIAGNOSIS — K5909 Other constipation: Secondary | ICD-10-CM

## 2019-11-23 LAB — NOVEL CORONAVIRUS, NAA: SARS-CoV-2, NAA: NOT DETECTED

## 2019-11-29 ENCOUNTER — Other Ambulatory Visit
Admission: RE | Admit: 2019-11-29 | Discharge: 2019-11-29 | Disposition: A | Discharge status: Home / Self Care | Payer: 59 | Source: Ambulatory Visit | Attending: Psychiatry | Admitting: Psychiatry

## 2019-11-29 DIAGNOSIS — Z79899 Other long term (current) drug therapy: Secondary | ICD-10-CM | POA: Diagnosis not present

## 2019-11-29 DIAGNOSIS — F250 Schizoaffective disorder, bipolar type: Secondary | ICD-10-CM | POA: Diagnosis not present

## 2019-11-29 LAB — CBC WITH DIFFERENTIAL/PLATELET
Abs Immature Granulocytes: 0.02 10*3/uL (ref 0.00–0.07)
Basophils Absolute: 0.1 10*3/uL (ref 0.0–0.1)
Basophils Relative: 1 %
Eosinophils Absolute: 0.3 10*3/uL (ref 0.0–0.5)
Eosinophils Relative: 4 %
HCT: 37.4 % (ref 36.0–46.0)
Hemoglobin: 12.1 g/dL (ref 12.0–15.0)
Immature Granulocytes: 0 %
Lymphocytes Relative: 23 %
Lymphs Abs: 1.9 10*3/uL (ref 0.7–4.0)
MCH: 30.8 pg (ref 26.0–34.0)
MCHC: 32.4 g/dL (ref 30.0–36.0)
MCV: 95.2 fL (ref 80.0–100.0)
Monocytes Absolute: 0.4 10*3/uL (ref 0.1–1.0)
Monocytes Relative: 5 %
Neutro Abs: 5.4 10*3/uL (ref 1.7–7.7)
Neutrophils Relative %: 67 %
Platelets: 260 10*3/uL (ref 150–400)
RBC: 3.93 MIL/uL (ref 3.87–5.11)
RDW: 12 % (ref 11.5–15.5)
WBC: 8 10*3/uL (ref 4.0–10.5)
nRBC: 0 % (ref 0.0–0.2)

## 2019-12-01 ENCOUNTER — Other Ambulatory Visit: Payer: Self-pay | Admitting: Psychiatry

## 2019-12-01 ENCOUNTER — Telehealth: Payer: Self-pay | Admitting: Psychiatry

## 2019-12-01 NOTE — Telephone Encounter (Signed)
Pt called to check status on lab results from 12/28 for refill on Clozapine. [facility] on file

## 2019-12-24 ENCOUNTER — Encounter: Payer: Self-pay | Admitting: Family Medicine

## 2019-12-24 ENCOUNTER — Ambulatory Visit (INDEPENDENT_AMBULATORY_CARE_PROVIDER_SITE_OTHER): Payer: 59 | Admitting: Family Medicine

## 2019-12-24 ENCOUNTER — Ambulatory Visit: Payer: 59 | Admitting: Family Medicine

## 2019-12-24 ENCOUNTER — Other Ambulatory Visit: Payer: Self-pay

## 2019-12-24 DIAGNOSIS — K5909 Other constipation: Secondary | ICD-10-CM | POA: Diagnosis not present

## 2019-12-24 DIAGNOSIS — E781 Pure hyperglyceridemia: Secondary | ICD-10-CM | POA: Diagnosis not present

## 2019-12-24 DIAGNOSIS — R000 Tachycardia, unspecified: Secondary | ICD-10-CM

## 2019-12-24 DIAGNOSIS — E559 Vitamin D deficiency, unspecified: Secondary | ICD-10-CM

## 2019-12-24 DIAGNOSIS — F250 Schizoaffective disorder, bipolar type: Secondary | ICD-10-CM

## 2019-12-24 DIAGNOSIS — K219 Gastro-esophageal reflux disease without esophagitis: Secondary | ICD-10-CM | POA: Diagnosis not present

## 2019-12-24 MED ORDER — LINACLOTIDE 145 MCG PO CAPS: 145.0000 ug | ORAL_CAPSULE | Freq: Every day | ORAL | 1 refills | Status: DC

## 2019-12-24 MED ORDER — VITAMIN D (ERGOCALCIFEROL) 1.25 MG (50000 UNIT) PO CAPS: ORAL_CAPSULE | ORAL | 0 refills | Status: DC

## 2019-12-24 NOTE — Progress Notes (Signed)
Name: Carla Thompson   MRN: [id number]    DOB: 28-Oct-1961   Date:12/24/2019       Progress Note  Subjective  Chief Complaint  Chief Complaint  Patient presents with  . Medication Refill    6 month F/U  . Hyperlipidemia  . Gastroesophageal Reflux    Gets nauseous sometimes  . Schizophrenia  . Chronic Constipation    Taking Linzess daily and has had diarrhea a couple of times recently  . Stress and Urge incontinence    I connected with  Carla Thompson on 12/24/19 at 11:20 AM EST by telephone and verified that I am speaking with the correct person using two identifiers.  I discussed the limitations, risks, security and privacy concerns of performing an evaluation and management service by telephone and the availability of in person appointments. Staff also discussed with the patient that there may be a patient responsible charge related to this service. Patient Location: at home  Provider Location: [facility] Additional Individuals present: husband   HPI  Chronic constipation: she isback onLinzess. She was having  bowel movements every 2 to 3 days. Bristol scale is type usually a 4,  not straining, however they travelled to [place] in December and had a severe change in her bowel movement. She states she is eating prunes and taking Linzess, she is trying to walk more often again and increase in fiber. She had one episode of diarrhea after a good bowel movement this week.   Schizophrenia: Seeing psychiatrist, Carla Thompson  . No longer has hallucinations.She has been cooking and cleaning again.  Husband present during the visit and thinks she is back to baseline. No paranoia or hallucinations.She does not drive because of fear or getting in an accident secondary to medication that she needs to take. Last CBC done recently   GERD:taking prn tums. States seems to continue to have bad taste in her mouth at night , they have raised the head of the bed, she only  drinks a little water around 8 pm to take her medications. Discussed giving her daily medication but she wants to hold off for now  Tachycardia: doing well on Lopressor half pill twice daily, she denies SOB or chest pain, denies palpitation. Still the same   Hypertriglyceridemia: The 10-year ASCVD risk score Mikey Bussing DC Brooke Bonito., et al., 2013) is: 1.8%   Values used to calculate the score:     Age: 59 years     Sex: Female     Is Non-Hispanic African American: No     Diabetic: No     Tobacco smoker: No     Systolic Blood Pressure: 123XX123 mmHg     Is BP treated: No     HDL Cholesterol: 55 mg/dL     Total Cholesterol: 236 mg/dL    Patient Active Problem List   Diagnosis Date Noted  . Hyperlipidemia 11/20/2017  . GERD without esophagitis 02/21/2017  . Tachycardia 02/20/2016  . RLS (restless legs syndrome) 12/21/2015  . IBS (irritable bowel syndrome) 12/21/2015  . Other long term (current) drug therapy 12/08/2015  . Chronic constipation 12/08/2015  . Insomnia, persistent 12/08/2015  . Dermatitis, eczematoid 12/08/2015  . Gravida 2 para 2 12/08/2015  . Hypertriglyceridemia 12/08/2015  . Metallic taste [id number]  . 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 Schizoaffective disorder, bipolar type ([place]) 12/08/2015    Past Surgical History:  Procedure Laterality Date  . ANUS SURGERY  2000   [place]  . TUBAL LIGATION  12/02/1994    Family History  Problem Relation Age of Onset  . Mental illness Sister        Schizophrenia and bipolar  . Cancer Maternal Grandmother        Colon  . Mental illness Paternal Grandmother        Schizophrenia  . Breast cancer Neg Hx      Current Outpatient Medications:  .  aspirin (ASPIRIN LOW DOSE) 81 MG EC tablet, Take 1 tablet (81 mg total) by mouth daily. Swallow whole., Disp: 30 tablet, Rfl: 12 .  B Complex-C (SUPER B COMPLEX PO), Take 1 tablet by mouth daily., Disp: , Rfl:  .  CINNAMON PO, Take by mouth., Disp: , Rfl:  .  clonazePAM (KLONOPIN)  0.5 MG tablet, TAKE 1 TABLET BY MOUTH EVERY MORNING, AND 1 AND 1/2 TABLETS AT BEDTIME, Disp: 75 tablet, Rfl: 5 .  cloZAPine (CLOZARIL) 100 MG tablet, TAKE 1 TABLET BY MOUTH EVERY MORNING, AND 3 EVERY NIGHT AT BEDTIME, Disp: 120 tablet, Rfl: 6 .  docusate sodium (STOOL SOFTENER) 100 MG capsule, Take 100 mg by mouth daily as needed for mild constipation. , Disp: , Rfl:  .  famotidine (PEPCID) 20 MG tablet, Take 20 mg by mouth 3 (three) times a week. , Disp: , Rfl:  .  LINZESS 145 MCG CAPS capsule, TAKE 1 CAPSULE(145 MCG) BY MOUTH DAILY, Disp: 30 capsule, Rfl: 5 .  lithium 300 MG tablet, TAKE 2 TABLETS(600 MG) BY MOUTH AT BEDTIME, Disp: 180 tablet, Rfl: 1 .  magnesium oxide (MAG-OX) 400 MG tablet, Take 400 mg by mouth daily., Disp: , Rfl:  .  metoprolol tartrate (LOPRESSOR) 25 MG tablet, TAKE 1/2 TABLET(12.5 MG) BY MOUTH TWICE DAILY, Disp: 90 tablet, Rfl: 1 .  vitamin C (ASCORBIC ACID) 500 MG tablet, Take 500 mg by mouth daily., Disp: , Rfl:  .  Vitamin D, Ergocalciferol, (DRISDOL) 1.25 MG (50000 UT) CAPS capsule, TAKE 1 CAPSULE BY MOUTH EVERY 7 DAYS, Disp: 12 capsule, Rfl: 0 .  vitamin E 1000 UNIT capsule, Take 1,000 Units by mouth daily. , Disp: , Rfl:  .  Naftifine HCl 1 % GEL, Apply 1 g topically daily. (Patient not taking: Reported on 12/24/2019), Disp: 30 g, Rfl: 0 .  Pyridoxine HCl (VITAMIN B-6) 500 MG tablet, Take 500 mg by mouth 2 (two) times daily., Disp: , Rfl:   Allergies  Allergen Reactions  . Escitalopram Hives  . Levsin [Hyoscyamine Sulfate] Other (See Comments)    Reaction:  Patient took medication and felt really spacey    I personally reviewed active problem list, medication list, allergies, family history with the patient/caregiver today.   ROS  Ten systems reviewed and is negative except as mentioned in HPI   Objective  Virtual encounter, vitals not obtained.   There is no height or weight on file to calculate BMI.  Physical Exam  Awake, alert and  oriented  PHQ2/9: Depression screen [facility] 2/9 12/24/2019 06/21/2019 12/14/2018 09/02/2018 05/18/2018  Decreased Interest 0 0 1 0 0  Down, Depressed, Hopeless 0 0 1 0 0  PHQ - 2 Score 0 0 2 0 0  Altered sleeping 1 0 0 0 0  Tired, decreased energy 2 0 1 3 1   Change in appetite 0 0 0 0 0  Feeling bad or failure about yourself  0 0 0 0 0  Trouble concentrating 1 0 0 0 0  Moving slowly or fidgety/restless 1 0 0 0 1  Suicidal thoughts 0 0  0 0 0  PHQ-9 Score 5 0 3 3 2   Difficult doing work/chores Not difficult at all - Somewhat difficult Somewhat difficult -   PHQ-2/9 Result is negative.    Fall Risk: Fall Risk  12/24/2019 06/21/2019 12/14/2018 09/02/2018 05/18/2018  Falls in the past year? 0 1 1 Yes No  Comment - - Saturday October 03, 2018-tripped while walking in the dark at a friend's house - -  Number falls in past yr: 0 0 0 1 -  Comment - - Contusion on her face July 1st -  Injury with Fall? 0 0 1 Yes -  Comment - - - right knee -  Risk for fall due to : - - - - -     Assessment & Plan  1. Hypertriglyceridemia  Continue life style modification   2. Schizoaffective disorder, bipolar type ([place])  stable  3. GERD without esophagitis  She does not want to take medications except for Tums prn   4. Chronic constipation  - linaclotide (LINZESS) 145 MCG CAPS capsule; Take 1 capsule (145 mcg total) by mouth daily before breakfast.  Dispense: 90 capsule; Refill: 1  5. Vitamin D deficiency  - Vitamin D, Ergocalciferol, (DRISDOL) 1.25 MG (50000 UNIT) CAPS capsule; Weekly  Dispense: 12 capsule; Refill: 0  6. Tachycardia  I discussed the assessment and treatment plan with the patient. The patient was provided an opportunity to ask questions and all were answered. The patient agreed with the plan and demonstrated an understanding of the instructions.   The patient was advised to call back or seek an in-person evaluation if the symptoms worsen or if the condition fails to improve as  anticipated.  I provided 25  minutes of non-face-to-face time during this encounter.  Loistine Chance, MD

## 2019-12-27 ENCOUNTER — Other Ambulatory Visit
Admission: RE | Admit: 2019-12-27 | Discharge: 2019-12-27 | Disposition: A | Discharge status: Home / Self Care | Payer: 59 | Source: Ambulatory Visit | Attending: Psychiatry | Admitting: Psychiatry

## 2019-12-27 DIAGNOSIS — F250 Schizoaffective disorder, bipolar type: Secondary | ICD-10-CM | POA: Diagnosis not present

## 2019-12-27 LAB — CBC WITH DIFFERENTIAL/PLATELET
Abs Immature Granulocytes: 0.01 10*3/uL (ref 0.00–0.07)
Basophils Absolute: 0.1 10*3/uL (ref 0.0–0.1)
Basophils Relative: 1 %
Eosinophils Absolute: 0.4 10*3/uL (ref 0.0–0.5)
Eosinophils Relative: 4 %
HCT: 38.7 % (ref 36.0–46.0)
Hemoglobin: 12.2 g/dL (ref 12.0–15.0)
Immature Granulocytes: 0 %
Lymphocytes Relative: 24 %
Lymphs Abs: 2.2 10*3/uL (ref 0.7–4.0)
MCH: 30.1 pg (ref 26.0–34.0)
MCHC: 31.5 g/dL (ref 30.0–36.0)
MCV: 95.6 fL (ref 80.0–100.0)
Monocytes Absolute: 0.5 10*3/uL (ref 0.1–1.0)
Monocytes Relative: 5 %
Neutro Abs: 6 10*3/uL (ref 1.7–7.7)
Neutrophils Relative %: 66 %
Platelets: 267 10*3/uL (ref 150–400)
RBC: 4.05 MIL/uL (ref 3.87–5.11)
RDW: 12.2 % (ref 11.5–15.5)
WBC: 9.1 10*3/uL (ref 4.0–10.5)
nRBC: 0 % (ref 0.0–0.2)

## 2020-01-25 ENCOUNTER — Other Ambulatory Visit
Admission: RE | Admit: 2020-01-25 | Discharge: 2020-01-25 | Disposition: A | Discharge status: Home / Self Care | Payer: 59 | Source: Ambulatory Visit | Attending: Psychiatry | Admitting: Psychiatry

## 2020-01-25 DIAGNOSIS — F250 Schizoaffective disorder, bipolar type: Secondary | ICD-10-CM | POA: Diagnosis not present

## 2020-01-25 LAB — CBC WITH DIFFERENTIAL/PLATELET
Abs Immature Granulocytes: 0.01 10*3/uL (ref 0.00–0.07)
Basophils Absolute: 0.1 10*3/uL (ref 0.0–0.1)
Basophils Relative: 1 %
Eosinophils Absolute: 0.2 10*3/uL (ref 0.0–0.5)
Eosinophils Relative: 3 %
HCT: 39.2 % (ref 36.0–46.0)
Hemoglobin: 12.5 g/dL (ref 12.0–15.0)
Immature Granulocytes: 0 %
Lymphocytes Relative: 28 %
Lymphs Abs: 2.2 10*3/uL (ref 0.7–4.0)
MCH: 30.5 pg (ref 26.0–34.0)
MCHC: 31.9 g/dL (ref 30.0–36.0)
MCV: 95.6 fL (ref 80.0–100.0)
Monocytes Absolute: 0.5 10*3/uL (ref 0.1–1.0)
Monocytes Relative: 6 %
Neutro Abs: 5 10*3/uL (ref 1.7–7.7)
Neutrophils Relative %: 62 %
Platelets: 251 10*3/uL (ref 150–400)
RBC: 4.1 MIL/uL (ref 3.87–5.11)
RDW: 12 % (ref 11.5–15.5)
WBC: 7.9 10*3/uL (ref 4.0–10.5)
nRBC: 0 % (ref 0.0–0.2)

## 2020-02-17 ENCOUNTER — Other Ambulatory Visit: Payer: Self-pay | Admitting: Family Medicine

## 2020-02-17 DIAGNOSIS — R000 Tachycardia, unspecified: Secondary | ICD-10-CM

## 2020-02-18 ENCOUNTER — Other Ambulatory Visit
Admission: RE | Admit: 2020-02-18 | Discharge: 2020-02-18 | Disposition: A | Discharge status: Home / Self Care | Payer: 59 | Source: Ambulatory Visit | Attending: Psychiatry | Admitting: Psychiatry

## 2020-02-18 DIAGNOSIS — F250 Schizoaffective disorder, bipolar type: Secondary | ICD-10-CM | POA: Diagnosis not present

## 2020-02-18 DIAGNOSIS — Z79899 Other long term (current) drug therapy: Secondary | ICD-10-CM | POA: Diagnosis present

## 2020-02-18 LAB — CBC WITH DIFFERENTIAL/PLATELET
Abs Immature Granulocytes: 0.04 10*3/uL (ref 0.00–0.07)
Basophils Absolute: 0.1 10*3/uL (ref 0.0–0.1)
Basophils Relative: 1 %
Eosinophils Absolute: 0.3 10*3/uL (ref 0.0–0.5)
Eosinophils Relative: 3 %
HCT: 39.5 % (ref 36.0–46.0)
Hemoglobin: 12.7 g/dL (ref 12.0–15.0)
Immature Granulocytes: 1 %
Lymphocytes Relative: 25 %
Lymphs Abs: 2.1 10*3/uL (ref 0.7–4.0)
MCH: 31.1 pg (ref 26.0–34.0)
MCHC: 32.2 g/dL (ref 30.0–36.0)
MCV: 96.6 fL (ref 80.0–100.0)
Monocytes Absolute: 0.4 10*3/uL (ref 0.1–1.0)
Monocytes Relative: 5 %
Neutro Abs: 5.5 10*3/uL (ref 1.7–7.7)
Neutrophils Relative %: 65 %
Platelets: 252 10*3/uL (ref 150–400)
RBC: 4.09 MIL/uL (ref 3.87–5.11)
RDW: 12.1 % (ref 11.5–15.5)
WBC: 8.2 10*3/uL (ref 4.0–10.5)
nRBC: 0 % (ref 0.0–0.2)

## 2020-03-09 ENCOUNTER — Encounter: Payer: Self-pay | Admitting: Psychiatry

## 2020-03-09 ENCOUNTER — Ambulatory Visit (INDEPENDENT_AMBULATORY_CARE_PROVIDER_SITE_OTHER): Payer: 59 | Admitting: Psychiatry

## 2020-03-09 ENCOUNTER — Other Ambulatory Visit: Payer: Self-pay

## 2020-03-09 DIAGNOSIS — Z79899 Other long term (current) drug therapy: Secondary | ICD-10-CM

## 2020-03-09 DIAGNOSIS — F4001 Agoraphobia with panic disorder: Secondary | ICD-10-CM | POA: Diagnosis not present

## 2020-03-09 DIAGNOSIS — F250 Schizoaffective disorder, bipolar type: Secondary | ICD-10-CM

## 2020-03-09 DIAGNOSIS — K5909 Other constipation: Secondary | ICD-10-CM | POA: Diagnosis not present

## 2020-03-09 MED ORDER — LITHIUM CARBONATE 300 MG PO TABS: 600.0000 mg | ORAL_TABLET | Freq: Every evening | ORAL | 1 refills | Status: DC

## 2020-03-09 MED ORDER — LINACLOTIDE 290 MCG PO CAPS: 290.0000 ug | ORAL_CAPSULE | Freq: Every day | ORAL | 5 refills | Status: DC

## 2020-03-09 MED ORDER — CLONAZEPAM 0.5 MG PO TABS: ORAL_TABLET | ORAL | 5 refills | Status: DC

## 2020-03-09 MED ORDER — CLOZAPINE 100 MG PO TABS: ORAL_TABLET | ORAL | 6 refills | Status: DC

## 2020-03-09 NOTE — Progress Notes (Signed)
ODESSIE STIPES [id number] 1961-04-05 59 y.o.  Subjective:   Patient ID:  Carla Thompson is a 59 y.o. (DOB 1960-12-28) female.  Chief Complaint:  Chief Complaint  Patient presents with  . Follow-up    psych med management    HPI LAKEETA FOLKS presents to the office today for follow-up of schizoaffective disorder.  Last visit October, 2020.  No med changes.  She continued on clozapine 400, lithium 600, and clonazepam 0.5 twice daily as needed.   StepF had been sick with Covid and other problems.  Died.  Other family members with Covid problems too and died. [name] worked from home November 21, 2023 to April  Good overall without complaints.  Stress dealing with Covid.  Trying to stay active including cooking.  No major episodes of confusion nor voices.    No fear.  Does emails on the computer.  Not much anxiety lately.  Sleep same 9-9.  No fear episodes.  No manic nor depressive episodes.  No significant panic attacks recently.  She is not avoiding things out of anxiety generally.  She's still active at church.    Oldest daughter married March 2020  On clozapine for many years with benefit.  Several psych hospitalizations.  She is failed multiple other psychiatric medications which is why she is on clozapine.  She is also failed or relapsed with dosage reduction of clozapine or if she missed clozapine.  She is consistent with her dosage now.  Large infrequent BM clog toilet and intermittently not well managed.   Past Psychiatric Medication Trials: Citrucil, Miralax, stool softener, Mg tablets  Sister has schizophrenia.  Review of Systems:  Review of Systems  Gastrointestinal: Positive for constipation. Negative for abdominal pain.  Neurological: Negative for tremors.  Psychiatric/Behavioral: Positive for decreased concentration. Negative for agitation, behavioral problems, confusion, dysphoric mood, hallucinations, self-injury, sleep disturbance and suicidal ideas. The patient is not  nervous/anxious and is not hyperactive.   Constipation comes and goes  Medications: I have reviewed the patient's current medications.  Current Outpatient Medications  Medication Sig Dispense Refill  . aspirin (ASPIRIN LOW DOSE) 81 MG EC tablet Take 1 tablet (81 mg total) by mouth daily. Swallow whole. 30 tablet 12  . B Complex-C (SUPER B COMPLEX PO) Take 1 tablet by mouth daily.    Marland Kitchen CINNAMON PO Take by mouth.    . clonazePAM (KLONOPIN) 0.5 MG tablet TAKE 1 TABLET BY MOUTH EVERY MORNING, AND 1 AND 1/2 TABLETS AT BEDTIME 75 tablet 5  . cloZAPine (CLOZARIL) 100 MG tablet TAKE 1 TABLET BY MOUTH EVERY MORNING, AND 3 EVERY NIGHT AT BEDTIME 120 tablet 6  . docusate sodium (STOOL SOFTENER) 100 MG capsule Take 100 mg by mouth daily as needed for mild constipation.     Marland Kitchen linaclotide (LINZESS) 290 MCG CAPS capsule Take 1 capsule (290 mcg total) by mouth daily before breakfast. 30 capsule 5  . lithium 300 MG tablet Take 2 tablets (600 mg total) by mouth at bedtime. 180 tablet 1  . magnesium oxide (MAG-OX) 400 MG tablet Take 400 mg by mouth daily.    . metoprolol tartrate (LOPRESSOR) 25 MG tablet TAKE 1/2 TABLET(12.5 MG) BY MOUTH TWICE DAILY 90 tablet 1  . Pyridoxine HCl (VITAMIN B-6) 500 MG tablet Take 500 mg by mouth 2 (two) times daily.    . vitamin C (ASCORBIC ACID) 500 MG tablet Take 500 mg by mouth daily.    . Vitamin D, Ergocalciferol, (DRISDOL) 1.25 MG (50000 UNIT) CAPS capsule Weekly  12 capsule 0  . vitamin E 1000 UNIT capsule Take 1,000 Units by mouth daily.      No current facility-administered medications for this visit.    Medication Side Effects: Other: alternating contstipation and diarrhea but has IBS  Allergies:  Allergies  Allergen Reactions  . Escitalopram Hives  . Levsin [Hyoscyamine Sulfate] Other (See Comments)    Reaction:  Patient took medication and felt really spacey    Past Medical History:  Diagnosis Date  . Abnormal perimenopausal bleeding   . Bowel  incontinence   . Chronic constipation   . Dermatitis   . High risk medication use   . Hyperglycemia   . Hypertriglyceridemia   . Iron deficiency anemia due to chronic blood loss    secondary to heavy flow  . Metallic taste   . Schizoaffective disorder, bipolar type ([place])    Dr. Tyrone Sage    Family History  Problem Relation Age of Onset  . Mental illness Sister        Schizophrenia and bipolar  . Cancer Maternal Grandmother        Colon  . Mental illness Paternal Grandmother        Schizophrenia  . Breast cancer Neg Hx     Social History   Socioeconomic History  . Marital status: Married    Spouse name: [name]  . Number of children: 2  . Years of education: Not on file  . Highest education level: Bachelor's degree (e.g., BA, AB, BS)  Occupational History  . Not on file  Tobacco Use  . Smoking status: Never Smoker  . Smokeless tobacco: Never Used  Substance and Sexual Activity  . Alcohol use: No    Alcohol/week: 0.0 standard drinks  . Drug use: No  . Sexual activity: Not Currently    Partners: Male  Other Topics Concern  . Not on file  Social History Narrative  . Not on file   Social Determinants of Health   Financial Resource Strain:   . Difficulty of Paying Living Expenses:   Food Insecurity:   . Worried About Charity fundraiser in the Last Year:   . Arboriculturist in the Last Year:   Transportation Needs:   . Film/video editor (Medical):   Marland Kitchen Lack of Transportation (Non-Medical):   Physical Activity:   . Days of Exercise per Week:   . Minutes of Exercise per Session:   Stress:   . Feeling of Stress :   Social Connections:   . Frequency of Communication with Friends and Family:   . Frequency of Social Gatherings with Friends and Family:   . Attends Religious Services:   . Active Member of Clubs or Organizations:   . Attends Archivist Meetings:   Marland Kitchen Marital Status:   Intimate Partner Violence:   . Fear of Current or  Ex-Partner:   . Emotionally Abused:   Marland Kitchen Physically Abused:   . Sexually Abused:     Past Medical History, Surgical history, Social history, and Family history were reviewed and updated as appropriate.   Please see review of systems for further details on the patient's review from today.   Objective:   Physical Exam:  LMP 12/16/2016 (Approximate)   Physical Exam Constitutional:      General: She is not in acute distress.    Appearance: She is well-developed.  Musculoskeletal:        General: No deformity.  Neurological:     Mental Status: She  is alert and oriented to person, place, and time.     Cranial Nerves: No dysarthria.     Coordination: Coordination normal.  Psychiatric:        Attention and Perception: Perception normal. She is inattentive. She does not perceive auditory or visual hallucinations.        Mood and Affect: Mood normal. Mood is not anxious or depressed. Affect is not labile, blunt, angry, tearful or inappropriate.        Speech: Speech is tangential. Speech is not rapid and pressured or slurred.        Behavior: Behavior normal. Behavior is cooperative.        Thought Content: Thought content normal. Thought content is not paranoid or delusional. Thought content does not include homicidal or suicidal ideation. Thought content does not include homicidal or suicidal plan.        Cognition and Memory: Cognition and memory normal.        Judgment: Judgment normal.     Comments: Insight fair. Scattered thought chronically with some hyperverbal and overinclusiveness.  No psychotic features today otherwise except loose thought.      Lab Review:     Component Value Date/Time   NA 139 03/22/2019 1022   NA 140 01/18/2016 1418   NA 139 08/27/2013 0930   K 4.1 03/22/2019 1022   K 4.2 08/27/2013 0930   CL 104 03/22/2019 1022   CL 108 (H) 08/27/2013 0930   CO2 30 03/22/2019 1022   CO2 28 08/27/2013 0930   GLUCOSE 99 03/22/2019 1022   GLUCOSE 98 08/27/2013  0930   BUN 12 03/22/2019 1022   BUN 17 01/18/2016 1418   BUN 11 08/27/2013 0930   CREATININE 0.89 03/22/2019 1022   CALCIUM 9.4 03/22/2019 1022   CALCIUM 8.5 08/27/2013 0930   PROT 6.3 03/22/2019 1022   PROT 6.6 01/18/2016 1418   PROT 7.2 08/27/2013 0930   ALBUMIN 4.2 05/18/2018 1305   ALBUMIN 4.4 01/18/2016 1418   ALBUMIN 3.5 08/27/2013 0930   AST 15 03/22/2019 1022   AST 20 08/27/2013 0930   ALT 13 03/22/2019 1022   ALT 19 08/27/2013 0930   ALKPHOS 74 05/18/2018 1305   ALKPHOS 107 08/27/2013 0930   BILITOT 0.4 03/22/2019 1022   BILITOT <0.2 01/18/2016 1418   BILITOT 0.3 08/27/2013 0930   GFRNONAA 72 03/22/2019 1022   GFRAA 84 03/22/2019 1022       Component Value Date/Time   WBC 8.2 02/18/2020 1352   RBC 4.09 02/18/2020 1352   HGB 12.7 02/18/2020 1352   HGB 12.3 01/23/2016 1231   HCT 39.5 02/18/2020 1352   HCT 38.6 01/23/2016 1231   PLT 252 02/18/2020 1352   PLT 280 01/23/2016 1231   MCV 96.6 02/18/2020 1352   MCV 95 01/23/2016 1231   MCV 92 03/24/2015 1244   MCH 31.1 02/18/2020 1352   MCHC 32.2 02/18/2020 1352   RDW 12.1 02/18/2020 1352   RDW 13.7 01/23/2016 1231   RDW 13.1 03/24/2015 1244   LYMPHSABS 2.1 02/18/2020 1352   LYMPHSABS 2.4 01/23/2016 1231   LYMPHSABS 2.2 03/24/2015 1244   MONOABS 0.4 02/18/2020 1352   MONOABS 0.4 03/24/2015 1244   EOSABS 0.3 02/18/2020 1352   EOSABS 0.1 01/23/2016 1231   EOSABS 0.4 03/24/2015 1244   BASOSABS 0.1 02/18/2020 1352   BASOSABS 0.0 01/23/2016 1231   BASOSABS 0.0 03/24/2015 1244    Lithium Lvl  Date Value Ref Range Status  03/22/2019 0.7 0.6 -  1.2 mmol/L Final     Lab Results  Component Value Date   VALPROATE 102 (H) 12/26/2015     .res Assessment: Plan:    Schizoaffective disorder, bipolar type ([place]) - Plan: CBC with Differential/Platelet, Lithium level, cloZAPine (CLOZARIL) 100 MG tablet, lithium 300 MG tablet  Long term current use of clozapine - Plan: CBC with Differential/Platelet  Panic  disorder with agoraphobia - Plan: clonazePAM (KLONOPIN) 0.5 MG tablet  Chronic constipation - Plan: linaclotide (LINZESS) 290 MCG CAPS capsule   Overall her schizoaffective disorder is under control.  She does not have the manic drivenness hyperactivity and rapid speech that she has had in the past.  She does not have the level of confusion and obsessiveness and rumination that she has had with psychotic episodes.  She does have a baseline level of scattered thinking and hyperverbal speech but it is directable.  She is at baseline same as the last visit.  Not self aware about overinclusiveness.  Panic disorder controlled.  Good response to meds. Disc dealing with sleepiness with the medication.   No med changes indicated.  Lithium level ordered.   ANC stable the last 6 months. Continue clozapine.   Disc vaccines fears.  Continue lab test per usual.  Discussed the risks and benefits of perhaps schedule ruling out the clozapine levels a little less frequently per the current protocol of the cl[facility]apine clinic at [facility].    Still problems with large infrequent q 4-7 days, large BM.  Often clogs the toilet.  Not well managed. Increase Linzess trial to 290 mg daily to see if it's better managed.  Disc SE.  This appt was 30 mins.  FU 6 mos  Lynder Parents, MD, DFAPA   Please see After Visit Summary for patient specific instructions.  Future Appointments  Date Time Provider [place]  06/23/2020  2:00 PM Steele Sizer, MD [facility]    Orders Placed This Encounter  Procedures  . CBC with Differential/Platelet  . Lithium level      -------------------------------

## 2020-03-09 NOTE — Patient Instructions (Signed)
For chronic constipation start taking 2 Linzess 145 mg capsules daily.  If that is helpful then fill the new prescription which will be one of the 290 mg capsules.  If that dose is not helpful call the office back.

## 2020-03-14 ENCOUNTER — Other Ambulatory Visit: Payer: Self-pay | Admitting: Family Medicine

## 2020-03-14 DIAGNOSIS — E559 Vitamin D deficiency, unspecified: Secondary | ICD-10-CM

## 2020-03-17 ENCOUNTER — Telehealth: Payer: Self-pay | Admitting: Psychiatry

## 2020-03-17 NOTE — Telephone Encounter (Signed)
Patient called and said that she has been taking the linzess 137mcg she has been taking two a day. She said that she has dirahea. She also said that this medicine is very expensive $ 400 . She would like to try something else. Please call her at [phone]

## 2020-03-20 NOTE — Telephone Encounter (Signed)
Rtc to patient and she decreased her dose back down to 145 mg Linzess, she said it is improving but she's still having some diarrhea. Advised her to keep hydrated and to contact her PCP for a change in medication. She said she will see how she does this week then call them if needed.

## 2020-03-20 NOTE — Telephone Encounter (Signed)
When I last saw her she reported taking Linzess 145 mg daily and still battling some constipation.  I was offering to help her by writing for a higher dose of 290 mg daily.  She is now reporting diarrhea and concerns about the cost.  I am not highly knowledgeable about the different types of options available to her.  My knowledge is limited to just simply a daily adjusting the dose of which she was on.  She needs to go back to her primary care doctor or GI doctor and asked them about what to do from this point.

## 2020-03-24 ENCOUNTER — Other Ambulatory Visit
Admission: RE | Admit: 2020-03-24 | Discharge: 2020-03-24 | Disposition: A | Discharge status: Home / Self Care | Payer: 59 | Source: Ambulatory Visit | Attending: Psychiatry | Admitting: Psychiatry

## 2020-03-24 DIAGNOSIS — Z79899 Other long term (current) drug therapy: Secondary | ICD-10-CM | POA: Diagnosis present

## 2020-03-24 DIAGNOSIS — F250 Schizoaffective disorder, bipolar type: Secondary | ICD-10-CM

## 2020-03-24 LAB — CBC WITH DIFFERENTIAL/PLATELET
Abs Immature Granulocytes: 0.02 10*3/uL (ref 0.00–0.07)
Basophils Absolute: 0.1 10*3/uL (ref 0.0–0.1)
Basophils Relative: 1 %
Eosinophils Absolute: 0.4 10*3/uL (ref 0.0–0.5)
Eosinophils Relative: 4 %
HCT: 41.1 % (ref 36.0–46.0)
Hemoglobin: 13 g/dL (ref 12.0–15.0)
Immature Granulocytes: 0 %
Lymphocytes Relative: 37 %
Lymphs Abs: 3.1 10*3/uL (ref 0.7–4.0)
MCH: 30.4 pg (ref 26.0–34.0)
MCHC: 31.6 g/dL (ref 30.0–36.0)
MCV: 96.3 fL (ref 80.0–100.0)
Monocytes Absolute: 0.5 10*3/uL (ref 0.1–1.0)
Monocytes Relative: 5 %
Neutro Abs: 4.5 10*3/uL (ref 1.7–7.7)
Neutrophils Relative %: 53 %
Platelets: 270 10*3/uL (ref 150–400)
RBC: 4.27 MIL/uL (ref 3.87–5.11)
RDW: 12.2 % (ref 11.5–15.5)
WBC: 8.6 10*3/uL (ref 4.0–10.5)
nRBC: 0 % (ref 0.0–0.2)

## 2020-03-24 LAB — LITHIUM LEVEL: Lithium Lvl: 0.58 mmol/L — ABNORMAL LOW (ref 0.60–1.20)

## 2020-03-29 ENCOUNTER — Telehealth: Payer: Self-pay | Admitting: Psychiatry

## 2020-03-29 NOTE — Telephone Encounter (Signed)
Pt called reporting [facility] has never received labs. Checking status on Labs results. Labs done 4/23. Please advise pt  [phone]

## 2020-04-25 ENCOUNTER — Other Ambulatory Visit
Admission: RE | Admit: 2020-04-25 | Discharge: 2020-04-25 | Disposition: A | Discharge status: Home / Self Care | Payer: Managed Care, Other (non HMO) | Source: Ambulatory Visit | Attending: Psychiatry | Admitting: Psychiatry

## 2020-04-25 ENCOUNTER — Other Ambulatory Visit: Payer: Self-pay | Admitting: Psychiatry

## 2020-04-25 DIAGNOSIS — Z79899 Other long term (current) drug therapy: Secondary | ICD-10-CM | POA: Diagnosis not present

## 2020-04-25 DIAGNOSIS — F250 Schizoaffective disorder, bipolar type: Secondary | ICD-10-CM | POA: Insufficient documentation

## 2020-04-25 LAB — CBC WITH DIFFERENTIAL/PLATELET
Abs Immature Granulocytes: 0.04 10*3/uL (ref 0.00–0.07)
Basophils Absolute: 0.1 10*3/uL (ref 0.0–0.1)
Basophils Relative: 1 %
Eosinophils Absolute: 0.3 10*3/uL (ref 0.0–0.5)
Eosinophils Relative: 3 %
HCT: 37.7 % (ref 36.0–46.0)
Hemoglobin: 12 g/dL (ref 12.0–15.0)
Immature Granulocytes: 0 %
Lymphocytes Relative: 25 %
Lymphs Abs: 2.2 10*3/uL (ref 0.7–4.0)
MCH: 31 pg (ref 26.0–34.0)
MCHC: 31.8 g/dL (ref 30.0–36.0)
MCV: 97.4 fL (ref 80.0–100.0)
Monocytes Absolute: 0.6 10*3/uL (ref 0.1–1.0)
Monocytes Relative: 7 %
Neutro Abs: 5.7 10*3/uL (ref 1.7–7.7)
Neutrophils Relative %: 64 %
Platelets: 247 10*3/uL (ref 150–400)
RBC: 3.87 MIL/uL (ref 3.87–5.11)
RDW: 12.2 % (ref 11.5–15.5)
WBC: 8.9 10*3/uL (ref 4.0–10.5)
nRBC: 0 % (ref 0.0–0.2)

## 2020-04-27 ENCOUNTER — Telehealth: Payer: Self-pay | Admitting: Psychiatry

## 2020-04-27 NOTE — Telephone Encounter (Signed)
Labs have been printed and faxed.

## 2020-04-27 NOTE — Telephone Encounter (Signed)
Carla Thompson called about her labs.  Are they ok to print and fax to [facility]?

## 2020-04-27 NOTE — Telephone Encounter (Signed)
Please print off and fax [facility]

## 2020-05-26 ENCOUNTER — Other Ambulatory Visit
Admission: RE | Admit: 2020-05-26 | Discharge: 2020-05-26 | Disposition: A | Discharge status: Home / Self Care | Payer: 59 | Attending: Psychiatry | Admitting: Psychiatry

## 2020-05-26 DIAGNOSIS — F250 Schizoaffective disorder, bipolar type: Secondary | ICD-10-CM | POA: Insufficient documentation

## 2020-05-26 LAB — CBC WITH DIFFERENTIAL/PLATELET
Abs Immature Granulocytes: 0.03 10*3/uL (ref 0.00–0.07)
Basophils Absolute: 0.1 10*3/uL (ref 0.0–0.1)
Basophils Relative: 1 %
Eosinophils Absolute: 0.2 10*3/uL (ref 0.0–0.5)
Eosinophils Relative: 2 %
HCT: 38.7 % (ref 36.0–46.0)
Hemoglobin: 12.7 g/dL (ref 12.0–15.0)
Immature Granulocytes: 0 %
Lymphocytes Relative: 25 %
Lymphs Abs: 2.4 10*3/uL (ref 0.7–4.0)
MCH: 30.5 pg (ref 26.0–34.0)
MCHC: 32.8 g/dL (ref 30.0–36.0)
MCV: 92.8 fL (ref 80.0–100.0)
Monocytes Absolute: 0.6 10*3/uL (ref 0.1–1.0)
Monocytes Relative: 7 %
Neutro Abs: 6.2 10*3/uL (ref 1.7–7.7)
Neutrophils Relative %: 65 %
Platelets: 258 10*3/uL (ref 150–400)
RBC: 4.17 MIL/uL (ref 3.87–5.11)
RDW: 12.1 % (ref 11.5–15.5)
WBC: 9.5 10*3/uL (ref 4.0–10.5)
nRBC: 0 % (ref 0.0–0.2)

## 2020-05-31 ENCOUNTER — Other Ambulatory Visit: Payer: Self-pay

## 2020-05-31 ENCOUNTER — Telehealth: Payer: Self-pay | Admitting: Family Medicine

## 2020-05-31 ENCOUNTER — Telehealth: Payer: Self-pay | Admitting: Psychiatry

## 2020-05-31 DIAGNOSIS — F4001 Agoraphobia with panic disorder: Secondary | ICD-10-CM

## 2020-05-31 DIAGNOSIS — K5909 Other constipation: Secondary | ICD-10-CM

## 2020-05-31 DIAGNOSIS — E559 Vitamin D deficiency, unspecified: Secondary | ICD-10-CM

## 2020-05-31 DIAGNOSIS — F250 Schizoaffective disorder, bipolar type: Secondary | ICD-10-CM

## 2020-05-31 MED ORDER — CLONAZEPAM 0.5 MG PO TABS: ORAL_TABLET | ORAL | 5 refills | Status: DC

## 2020-05-31 MED ORDER — CLOZAPINE 100 MG PO TABS: ORAL_TABLET | ORAL | 6 refills | Status: DC

## 2020-05-31 NOTE — Telephone Encounter (Signed)
Pt requesting refill for Clozapine and Clonazepam to [facility]. Labs results in system. Also, send to [facility] Pt asked.   Apt 10/7.

## 2020-05-31 NOTE — Telephone Encounter (Signed)
Requested medication (s) are due for refill today:no  Requested medication (s) are on the active medication list:yes  Last refill: 05/06/2020  Future visit scheduled:yes  Notes to clinic: this refill cannot be delegated  Looks like the dose has been changed    Requested Prescriptions  Pending Prescriptions Disp Refills   LINZESS 145 MCG CAPS capsule [Pharmacy Med Name: Rolan Lipa 145MCG CAPSULES] 90 capsule 1    Sig: TAKE 1 CAPSULE(145 MCG) Chauncey      Gastroenterology: Irritable Bowel Syndrome Passed - 05/31/2020 10:14 AM      Passed - Valid encounter within last 12 months    Recent Outpatient Visits           5 months ago Hypertriglyceridemia   [facility] Steele Sizer, MD   11 months ago Hypertriglyceridemia   [facility] Steele Sizer, MD   1 year ago Hypertriglyceridemia   [facility] Steele Sizer, MD   1 year ago Well woman exam   [facility] Steele Sizer, MD   2 years ago Chronic constipation   [facility], Drue Stager, MD       Future Appointments             In 1 month Ancil Boozer, Drue Stager, MD [facility]              Vitamin D, Ergocalciferol, (DRISDOL) 1.25 MG (50000 UNIT) CAPS capsule [Pharmacy Med Name: VITAMIN D2 50,000IU (ERGO) CAP RX] 12 capsule 0    Sig: TAKE ONE CAPSULE BY MOUTH WEEKLY      Endocrinology:  Vitamins - Vitamin D Supplementation Failed - 05/31/2020 10:14 AM      Failed - 50,000 IU strengths are not delegated      Failed - Ca in normal range and within 360 days    Calcium  Date Value Ref Range Status  03/22/2019 9.4 8.6 - 10.4 mg/dL Final   Calcium, Total  Date Value Ref Range Status  08/27/2013 8.5 8.5 - 10.1 mg/dL Final          Failed - Phosphate in normal range and within 360 days    No results found for: PHOS        Failed - Vitamin D in normal range and within 360  days    Vit D, 25-Hydroxy  Date Value Ref Range Status  03/22/2019 46 30 - 100 ng/mL Final    Comment:    Vitamin D Status         25-OH Vitamin D: . Deficiency:                    <20 ng/mL Insufficiency:             20 - 29 ng/mL Optimal:                 > or = 30 ng/mL . For 25-OH Vitamin D testing on patients on  D2-supplementation and patients for whom quantitation  of D2 and D3 fractions is required, the QuestAssureD(TM) 25-OH VIT D, (D2,D3), LC/MS/MS is recommended: order  code [phone] (patients >37yrs). . For more information on this test, go to: http://education.questdiagnostics.com/faq/FAQ163 (This link is being provided for  informational/educational purposes only.)           Passed - Valid encounter within last 12 months    Recent Outpatient Visits  5 months ago Hypertriglyceridemia   [facility] Steele Sizer, MD   11 months ago Hypertriglyceridemia   [facility] Steele Sizer, MD   1 year ago Hypertriglyceridemia   [facility] Steele Sizer, MD   1 year ago Well woman exam   [facility] Steele Sizer, MD   2 years ago Chronic constipation   [facility] Steele Sizer, MD       Future Appointments             In 1 month Ancil Boozer, Drue Stager, MD [facility], [facility]

## 2020-05-31 NOTE — Telephone Encounter (Signed)
Patient using 2 different pharmacies, Clozapine can only be filled at [facility] in [place]. Her clonazepam and clozapine both were showing refills at [facility] in [place] not at [place] location. Pended both Rx's for Dr. Clovis Pu to send to [facility] in [place].

## 2020-06-06 NOTE — Telephone Encounter (Signed)
Patient is returning Esmeralda call. Call back [phone]

## 2020-06-06 NOTE — Telephone Encounter (Signed)
She has been taking Linzess 145 mcg. Can this medication cause her to have diarrhea. She has been had diarrhea at least 14 times this year.

## 2020-06-07 NOTE — Telephone Encounter (Signed)
Called patient in regards to holding Linzess to see if diarrhea resolves. She verbalized understanding and will call back next week to let us know the results.

## 2020-06-19 ENCOUNTER — Other Ambulatory Visit
Admission: RE | Admit: 2020-06-19 | Discharge: 2020-06-19 | Disposition: A | Discharge status: Home / Self Care | Payer: 59 | Attending: Psychiatry | Admitting: Psychiatry

## 2020-06-19 DIAGNOSIS — F250 Schizoaffective disorder, bipolar type: Secondary | ICD-10-CM | POA: Diagnosis not present

## 2020-06-19 LAB — CBC WITH DIFFERENTIAL/PLATELET
Abs Immature Granulocytes: 0.04 10*3/uL (ref 0.00–0.07)
Basophils Absolute: 0 10*3/uL (ref 0.0–0.1)
Basophils Relative: 0 %
Eosinophils Absolute: 0.3 10*3/uL (ref 0.0–0.5)
Eosinophils Relative: 3 %
HCT: 37.2 % (ref 36.0–46.0)
Hemoglobin: 11.8 g/dL — ABNORMAL LOW (ref 12.0–15.0)
Immature Granulocytes: 0 %
Lymphocytes Relative: 19 %
Lymphs Abs: 2.2 10*3/uL (ref 0.7–4.0)
MCH: 30.9 pg (ref 26.0–34.0)
MCHC: 31.7 g/dL (ref 30.0–36.0)
MCV: 97.4 fL (ref 80.0–100.0)
Monocytes Absolute: 0.7 10*3/uL (ref 0.1–1.0)
Monocytes Relative: 6 %
Neutro Abs: 8.3 10*3/uL — ABNORMAL HIGH (ref 1.7–7.7)
Neutrophils Relative %: 72 %
Platelets: 259 10*3/uL (ref 150–400)
RBC: 3.82 MIL/uL — ABNORMAL LOW (ref 3.87–5.11)
RDW: 12.1 % (ref 11.5–15.5)
WBC: 11.6 10*3/uL — ABNORMAL HIGH (ref 4.0–10.5)
nRBC: 0 % (ref 0.0–0.2)

## 2020-06-23 ENCOUNTER — Telehealth: Payer: 59 | Admitting: Family Medicine

## 2020-06-30 ENCOUNTER — Telehealth: Payer: Self-pay | Admitting: Psychiatry

## 2020-06-30 NOTE — Telephone Encounter (Signed)
Pt called and said that she is in [place] and needs her klonopin refill sent to the [facility] [place]. Please call with any questions. [phone]

## 2020-07-03 ENCOUNTER — Other Ambulatory Visit: Payer: Self-pay

## 2020-07-03 DIAGNOSIS — F4001 Agoraphobia with panic disorder: Secondary | ICD-10-CM

## 2020-07-03 MED ORDER — CLONAZEPAM 0.5 MG PO TABS: ORAL_TABLET | ORAL | 5 refills | Status: DC

## 2020-07-11 ENCOUNTER — Ambulatory Visit (INDEPENDENT_AMBULATORY_CARE_PROVIDER_SITE_OTHER): Payer: 59 | Admitting: Family Medicine

## 2020-07-11 ENCOUNTER — Other Ambulatory Visit: Payer: Self-pay

## 2020-07-11 ENCOUNTER — Other Ambulatory Visit (HOSPITAL_COMMUNITY)
Admission: RE | Admit: 2020-07-11 | Discharge: 2020-07-11 | Disposition: A | Discharge status: Home / Self Care | Payer: 59 | Source: Ambulatory Visit | Attending: Family Medicine | Admitting: Family Medicine

## 2020-07-11 ENCOUNTER — Encounter: Payer: Self-pay | Admitting: Family Medicine

## 2020-07-11 VITALS — BP 100/60 | HR 96 | Temp 98.0°F | Resp 16 | Ht 64.0 in | Wt 139.4 lb

## 2020-07-11 DIAGNOSIS — Z0000 Encounter for general adult medical examination without abnormal findings: Secondary | ICD-10-CM

## 2020-07-11 DIAGNOSIS — Z1231 Encounter for screening mammogram for malignant neoplasm of breast: Secondary | ICD-10-CM

## 2020-07-11 DIAGNOSIS — E559 Vitamin D deficiency, unspecified: Secondary | ICD-10-CM

## 2020-07-11 DIAGNOSIS — N3946 Mixed incontinence: Secondary | ICD-10-CM

## 2020-07-11 DIAGNOSIS — R739 Hyperglycemia, unspecified: Secondary | ICD-10-CM | POA: Diagnosis not present

## 2020-07-11 DIAGNOSIS — Z114 Encounter for screening for human immunodeficiency virus [HIV]: Secondary | ICD-10-CM

## 2020-07-11 DIAGNOSIS — K581 Irritable bowel syndrome with constipation: Secondary | ICD-10-CM

## 2020-07-11 DIAGNOSIS — Z124 Encounter for screening for malignant neoplasm of cervix: Secondary | ICD-10-CM | POA: Diagnosis present

## 2020-07-11 DIAGNOSIS — E781 Pure hyperglyceridemia: Secondary | ICD-10-CM

## 2020-07-11 DIAGNOSIS — N6324 Unspecified lump in the left breast, lower inner quadrant: Secondary | ICD-10-CM

## 2020-07-11 DIAGNOSIS — K219 Gastro-esophageal reflux disease without esophagitis: Secondary | ICD-10-CM

## 2020-07-11 DIAGNOSIS — F250 Schizoaffective disorder, bipolar type: Secondary | ICD-10-CM

## 2020-07-11 DIAGNOSIS — K5909 Other constipation: Secondary | ICD-10-CM | POA: Diagnosis not present

## 2020-07-11 DIAGNOSIS — Z79899 Other long term (current) drug therapy: Secondary | ICD-10-CM

## 2020-07-11 NOTE — Progress Notes (Signed)
Name: Carla Thompson   MRN: [id number]    DOB: 04-Apr-1961   Date:07/11/2020       Progress Note  Subjective  Chief Complaint  Chief Complaint  Patient presents with   Hyperlipidemia   Gastroesophageal Reflux   Irritable Bowel Syndrome    HPI  Patient presents for annual CPE and follow up  Chronic constipation: she is off medication for about 6 weeks, she was having diarrhea on Linzess but having normal bowel movements now bristol 3-  4 , almost daily   Schizophrenia: Seeing psychiatrist, Carla Thompson  . No longer has hallucinations.She has been cooking and cleaning again. Able to have friends over. Husband is here with her and thinks she is back to baseline. No paranoia or hallucinations.She does not drive because of fear or getting in an accident secondary to medication that she needs to take.She is feeling excited about becoming a grandmother soon. Carla Thompson is having her first baby in a few weeks.   GERD:taking an acid controller otc, takes it a few times a week. She denies heartburn or indigestion, but has some nausea intermittently   Tachycardia: doing well on Lopressor half pill twice daily, she denies SOB or chest pain, denies palpitation. We will continue current medications   Hypertriglyceridemia:  The 10-year ASCVD risk score Mikey Bussing DC Jr., et al., 2013) is: 1.8%   Values used to calculate the score:     Age: 59 years     Sex: Female     Is Non-Hispanic African American: No     Diabetic: No     Tobacco smoker: No     Systolic Blood Pressure: 563 mmHg     Is BP treated: No     HDL Cholesterol: 55 mg/dL     Total Cholesterol: 236 mg/dL   Diet: trying to eat a balanced diet  Exercise: at least 10 minutes inside the house sometimes walks outside also   USPSTF grade A and B recommendations    Office Visit from 07/11/2020 in [facility]  AUDIT-C Score 0     Depression: Phq 9 is  negative Depression screen [facility] 2/9 07/11/2020 12/24/2019  06/21/2019 12/14/2018 09/02/2018  Decreased Interest 0 0 0 1 0  Down, Depressed, Hopeless 0 0 0 1 0  PHQ - 2 Score 0 0 0 2 0  Altered sleeping 0 1 0 0 0  Tired, decreased energy 1 2 0 1 3  Change in appetite 0 0 0 0 0  Feeling bad or failure about yourself  0 0 0 0 0  Trouble concentrating 0 1 0 0 0  Moving slowly or fidgety/restless 1 1 0 0 0  Suicidal thoughts 0 0 0 0 0  PHQ-9 Score 2 5 0 3 3  Difficult doing work/chores Somewhat difficult Not difficult at all - Somewhat difficult Somewhat difficult  Some recent data might be hidden   Hypertension: BP Readings from Last 3 Encounters:  07/11/20 100/60  06/21/19 100/70  12/14/18 128/82   Obesity: Wt Readings from Last 3 Encounters:  07/11/20 139 lb 6.4 oz (63.2 kg)  06/21/19 139 lb 6.4 oz (63.2 kg)  12/14/18 141 lb 1.6 oz (64 kg)   BMI Readings from Last 3 Encounters:  07/11/20 23.93 kg/m  06/21/19 23.93 kg/m  12/14/18 24.22 kg/m     Hep C Screening: up to date  STD testing and prevention (HIV/chl/gon/syphilis): only HIV today  Intimate partner violence: negative screen  Sexual History (Partners/Practices/Protection from [employer] hx  STI/Pregnancy Plans): discomfort from dryness but uses lubes, married Pain during Intercourse: see above Menstrual History/LMP/Abnormal Bleeding: discussed post-menopausal bleeding  Incontinence Symptoms: stable   Breast cancer:  - Last Mammogram: 09/2019  - BRCA gene screening: N/A  Osteoporosis: Discussed high calcium and vitamin D supplementation, weight bearing exercises  Cervical cancer screening: today   Skin cancer: Discussed monitoring for atypical lesions  Colorectal cancer: up to date  Lung cancer:   Low Dose CT Chest recommended if Age 74-80 years, 30 pack-year currently smoking OR have quit w/in 15years. Patient does not qualify.   ECG: 2017   Advanced Care Planning: A voluntary discussion about advance care planning including the explanation and discussion of advance  directives.  Discussed health care proxy and Living will, and the patient was able to identify a health care proxy as husband .   Lipids: Lab Results  Component Value Date   CHOL 236 (H) 03/22/2019   CHOL 265 (H) 05/18/2018   CHOL 246 (H) 11/18/2017   Lab Results  Component Value Date   HDL 55 03/22/2019   HDL 62 05/18/2018   HDL 60 11/18/2017   Lab Results  Component Value Date   LDLCALC 142 (H) 03/22/2019   LDLCALC 163 (H) 05/18/2018   LDLCALC 149 (H) 11/18/2017   Lab Results  Component Value Date   TRIG 253 (H) 03/22/2019   TRIG 201 (H) 05/18/2018   TRIG 231 (H) 11/18/2017   Lab Results  Component Value Date   CHOLHDL 4.3 03/22/2019   CHOLHDL 4.3 05/18/2018   CHOLHDL 4.1 11/18/2017   No results found for: LDLDIRECT  Glucose: Glucose  Date Value Ref Range Status  08/27/2013 98 65 - 99 mg/dL Final  03/05/2013 105 (H) 65 - 99 mg/dL Final  03/04/2013 105 (H) 65 - 99 mg/dL Final   Glucose, Bld  Date Value Ref Range Status  03/22/2019 99 65 - 99 mg/dL Final    Comment:    .            Fasting reference interval .   05/18/2018 105 (H) 65 - 99 mg/dL Final  11/18/2017 99 65 - 99 mg/dL Final    Comment:    .            Fasting reference interval .    Glucose-Capillary  Date Value Ref Range Status  12/13/2015 97 65 - 99 mg/dL Final    Patient Active Problem List   Diagnosis Date Noted   Hyperlipidemia 11/20/2017   GERD without esophagitis 02/21/2017   Tachycardia 02/20/2016   RLS (restless legs syndrome) 12/21/2015   IBS (irritable bowel syndrome) 12/21/2015   Other long term (current) drug therapy 12/08/2015   Chronic constipation 12/08/2015   Insomnia, persistent 12/08/2015   Dermatitis, eczematoid 12/08/2015   Gravida 2 para 2 12/08/2015   Hypertriglyceridemia 82/50/5397   Metallic taste 67/34/1937   Female climacteric state 12/08/2015   Schizoaffective disorder, bipolar type ([place]) 12/08/2015    Past Surgical History:   Procedure Laterality Date   ANUS SURGERY  2000   [place]   TUBAL LIGATION  12/02/1994    Family History  Problem Relation Age of Onset   Mental illness Sister        Schizophrenia and bipolar   Cancer Maternal Grandmother        Colon   Mental illness Paternal Grandmother        Schizophrenia   Breast cancer Neg Hx     Social History   Socioeconomic  History   Marital status: Married    Spouse name: [name]   Number of children: 2   Years of education: Not on file   Highest education level: Bachelor's degree (e.g., BA, AB, BS)  Occupational History   Not on file  Tobacco Use   Smoking status: Never Smoker   Smokeless tobacco: Never Used  Vaping Use   Vaping Use: Never used  Substance and Sexual Activity   Alcohol use: No    Alcohol/week: 0.0 standard drinks   Drug use: No   Sexual activity: Not Currently    Partners: Male  Other Topics Concern   Not on file  Social History Narrative   Not on file   Social Determinants of Health   Financial Resource Strain: Low Risk    Difficulty of Paying Living Expenses: Not hard at all  Food Insecurity: No Food Insecurity   Worried About Charity fundraiser in the Last Year: Never true   Ran Out of Food in the Last Year: Never true  Transportation Needs: No Transportation Needs   Lack of Transportation (Medical): No   Lack of Transportation (Non-Medical): No  Physical Activity: Insufficiently Active   Days of Exercise per Week: 7 days   Minutes of Exercise per Session: 10 min  Stress: No Stress Concern Present   Feeling of Stress : Not at all  Social Connections: Socially Integrated   Frequency of Communication with Friends and Family: More than three times a week   Frequency of Social Gatherings with Friends and Family: More than three times a week   Attends Religious Services: More than 4 times per year   Active Member of [employer] or Organizations: Yes   Attends Arts administrator: More than 4 times per year   Marital Status: Married  Human resources officer Violence: Not At Risk   Fear of Current or Ex-Partner: No   Emotionally Abused: No   Physically Abused: No   Sexually Abused: No     Current Outpatient Medications:    aspirin (ASPIRIN LOW DOSE) 81 MG EC tablet, Take 1 tablet (81 mg total) by mouth daily. Swallow whole., Disp: 30 tablet, Rfl: 12   B Complex-C (SUPER B COMPLEX PO), Take 1 tablet by mouth daily., Disp: , Rfl:    CINNAMON PO, Take by mouth., Disp: , Rfl:    clonazePAM (KLONOPIN) 0.5 MG tablet, TAKE 1 TABLET BY MOUTH EVERY MORNING, AND 1 AND 1/2 TABLETS AT BEDTIME, Disp: 75 tablet, Rfl: 5   cloZAPine (CLOZARIL) 100 MG tablet, TAKE 1 TABLET BY MOUTH EVERY MORNING, AND 3 EVERY NIGHT AT BEDTIME, Disp: 120 tablet, Rfl: 6   docusate sodium (STOOL SOFTENER) 100 MG capsule, Take 100 mg by mouth daily as needed for mild constipation. , Disp: , Rfl:    lithium 300 MG tablet, Take 2 tablets (600 mg total) by mouth at bedtime., Disp: 180 tablet, Rfl: 1   magnesium oxide (MAG-OX) 400 MG tablet, Take 400 mg by mouth daily., Disp: , Rfl:    metoprolol tartrate (LOPRESSOR) 25 MG tablet, TAKE 1/2 TABLET(12.5 MG) BY MOUTH TWICE DAILY, Disp: 90 tablet, Rfl: 1   Pyridoxine HCl (VITAMIN B-6) 500 MG tablet, Take 500 mg by mouth 2 (two) times daily., Disp: , Rfl:    vitamin C (ASCORBIC ACID) 500 MG tablet, Take 500 mg by mouth daily., Disp: , Rfl:    Vitamin D, Ergocalciferol, (DRISDOL) 1.25 MG (50000 UNIT) CAPS capsule, TAKE ONE CAPSULE BY MOUTH WEEKLY, Disp: 12  capsule, Rfl: 0   vitamin E 1000 UNIT capsule, Take 1,000 Units by mouth daily. , Disp: , Rfl:    LINZESS 145 MCG CAPS capsule, Take 145 mcg by mouth daily., Disp: , Rfl:   Allergies  Allergen Reactions   Escitalopram Hives   Levsin [Hyoscyamine Sulfate] Other (See Comments)    Reaction:  Patient took medication and felt really spacey     ROS  Constitutional: Negative for fever or  weight change.  Respiratory: Negative for cough and shortness of breath.   Cardiovascular: Negative for chest pain or palpitations.  Gastrointestinal: Negative for abdominal pain, no bowel changes.  Musculoskeletal: Negative for gait problem or joint swelling.  Skin: Negative for rash.  Neurological: Negative for dizziness or headache.  No other specific complaints in a complete review of systems (except as listed in HPI above).  Objective  Vitals:   07/11/20 1437  BP: 100/60  Pulse: 96  Resp: 16  Temp: 98 F (36.7 C)  TempSrc: Oral  SpO2: 99%  Weight: 139 lb 6.4 oz (63.2 kg)  Height: 5' 4" (1.626 m)    Body mass index is 23.93 kg/m.  Physical Exam  Constitutional: Patient appears well-developed and well-nourished. No distress.  HENT: Head: Normocephalic and atraumatic. Ears: B TMs ok, no erythema or effusion; Nose: Nose normal. Mouth/Throat: not done Eyes: Conjunctivae and EOM are normal. Pupils are equal, round, and reactive to light. No scleral icterus.  Neck: Normal range of motion. Neck supple. No JVD present. No thyromegaly present.  Cardiovascular: Normal rate, regular rhythm and normal heart sounds.  No murmur heard. No BLE edema. Pulmonary/Chest: Effort normal and breath sounds normal. No respiratory distress. Abdominal: Soft. Bowel sounds are normal, no distension. There is no tenderness. no masses Breast: lump on left inner lower quadrant , no nipple discharge or rashes FEMALE GENITALIA:  External genitalia normal External urethra normal Vaginal vault normal without discharge or lesions Cervix normal without discharge or lesions Bimanual exam normal without masses RECTAL: not done  Musculoskeletal: Normal range of motion, no joint effusions. No gross deformities Neurological: he is alert and oriented to person, place, and time. No cranial nerve deficit. Coordination, balance, strength, speech and gait are normal.  Skin: Skin is warm and dry. No rash noted. No  erythema.  Psychiatric: Patient has a normal mood and affect. behavior is normal. Judgment and thought content normal.  Recent Results (from the past 2160 hour(s))  CBC with Differential/Platelet     Status: None   Collection Time: 04/25/20 12:44 PM  Result Value Ref Range   WBC 8.9 4.0 - 10.5 K/uL   RBC 3.87 3.87 - 5.11 MIL/uL   Hemoglobin 12.0 12.0 - 15.0 g/dL   HCT 37.7 36 - 46 %   MCV 97.4 80.0 - 100.0 fL   MCH 31.0 26.0 - 34.0 pg   MCHC 31.8 30.0 - 36.0 g/dL   RDW 12.2 11.5 - 15.5 %   Platelets 247 150 - 400 K/uL   nRBC 0.0 0.0 - 0.2 %   Neutrophils Relative % 64 %   Neutro Abs 5.7 1.7 - 7.7 K/uL   Lymphocytes Relative 25 %   Lymphs Abs 2.2 0.7 - 4.0 K/uL   Monocytes Relative 7 %   Monocytes Absolute 0.6 0 - 1 K/uL   Eosinophils Relative 3 %   Eosinophils Absolute 0.3 0 - 0 K/uL   Basophils Relative 1 %   Basophils Absolute 0.1 0 - 0 K/uL   Immature Granulocytes  0 %   Abs Immature Granulocytes 0.04 0.00 - 0.07 K/uL    Comment: Performed at [facility], [address] [place], [place] [address]  CBC with Differential/Platelet     Status: None   Collection Time: 05/26/20 12:10 PM  Result Value Ref Range   WBC 9.5 4.0 - 10.5 K/uL   RBC 4.17 3.87 - 5.11 MIL/uL   Hemoglobin 12.7 12.0 - 15.0 g/dL   HCT 38.7 36 - 46 %   MCV 92.8 80.0 - 100.0 fL   MCH 30.5 26.0 - 34.0 pg   MCHC 32.8 30.0 - 36.0 g/dL   RDW 12.1 11.5 - 15.5 %   Platelets 258 150 - 400 K/uL   nRBC 0.0 0.0 - 0.2 %   Neutrophils Relative % 65 %   Neutro Abs 6.2 1.7 - 7.7 K/uL   Lymphocytes Relative 25 %   Lymphs Abs 2.4 0.7 - 4.0 K/uL   Monocytes Relative 7 %   Monocytes Absolute 0.6 0 - 1 K/uL   Eosinophils Relative 2 %   Eosinophils Absolute 0.2 0 - 0 K/uL   Basophils Relative 1 %   Basophils Absolute 0.1 0 - 0 K/uL   Immature Granulocytes 0 %   Abs Immature Granulocytes 0.03 0.00 - 0.07 K/uL    Comment: Performed at [facility], [address] [place], [place] [address]  CBC  with Differential/Platelet     Status: Abnormal   Collection Time: 06/19/20 12:22 PM  Result Value Ref Range   WBC 11.6 (H) 4.0 - 10.5 K/uL   RBC 3.82 (L) 3.87 - 5.11 MIL/uL   Hemoglobin 11.8 (L) 12.0 - 15.0 g/dL   HCT 37.2 36 - 46 %   MCV 97.4 80.0 - 100.0 fL   MCH 30.9 26.0 - 34.0 pg   MCHC 31.7 30.0 - 36.0 g/dL   RDW 12.1 11.5 - 15.5 %   Platelets 259 150 - 400 K/uL   nRBC 0.0 0.0 - 0.2 %   Neutrophils Relative % 72 %   Neutro Abs 8.3 (H) 1.7 - 7.7 K/uL   Lymphocytes Relative 19 %   Lymphs Abs 2.2 0.7 - 4.0 K/uL   Monocytes Relative 6 %   Monocytes Absolute 0.7 0 - 1 K/uL   Eosinophils Relative 3 %   Eosinophils Absolute 0.3 0 - 0 K/uL   Basophils Relative 0 %   Basophils Absolute 0.0 0 - 0 K/uL   Immature Granulocytes 0 %   Abs Immature Granulocytes 0.04 0.00 - 0.07 K/uL    Comment: Performed at [facility], [address] [place], [place] [address]      Fall Risk: Fall Risk  07/11/2020 12/24/2019 06/21/2019 12/14/2018 09/02/2018  Falls in the past year? 1 0 1 1 Yes  Comment - - - Saturday October 03, 2018-tripped while walking in the dark at a friend's house -  Number falls in past yr: 0 0 0 0 1  Comment - - - Contusion on her face July 1st  Injury with Fall? 0 0 0 1 Yes  Comment - - - - right knee  Risk for fall due to : - - - - -     Assessment & Plan  1. Chronic constipation   2. Vitamin D deficiency  - VITAMIN D 25 Hydroxy (Vit-D Deficiency, Fractures)  3. Well adult exam   4. Hypertriglyceridemia  - Lipid panel  5. Hyperglycemia  - Hemoglobin A1c  6. GERD without esophagitis  7. Schizoaffective disorder, bipolar type ([place])  Keep follow up with Carla Thompson   8. Mixed incontinence urge and stress   9. Irritable bowel syndrome with constipation   10. Cervical cancer screening  - Cytology - PAP  11. Encounter for screening for HIV  - HIV Antibody (routine testing w rflx)  12. Long-term use of high-risk medication  -  COMPLETE METABOLIC PANEL WITH GFR   13. Encounter for screening mammogram for malignant neoplasm of breast  Cancelled   14. Breast lump on left side at 8 o'clock position  - MM Digital Diagnostic Bilat; Future - US BREAST LTD UNI LEFT INC AXILLA; Future   -USPSTF grade A and B recommendations reviewed with patient; age-appropriate recommendations, preventive care, screening tests, etc discussed and encouraged; healthy living encouraged; see AVS for patient education given to patient -Discussed importance of 150 minutes of physical activity weekly, eat two servings of fish weekly, eat one serving of tree nuts ( cashews, pistachios, pecans, almonds.Marland Kitchen) every other day, eat 6 servings of fruit/vegetables daily and drink plenty of water and avoid sweet beverages.

## 2020-07-11 NOTE — Patient Instructions (Signed)

## 2020-07-12 ENCOUNTER — Telehealth: Payer: Self-pay

## 2020-07-12 ENCOUNTER — Other Ambulatory Visit: Payer: Self-pay | Admitting: Family Medicine

## 2020-07-12 DIAGNOSIS — Z79899 Other long term (current) drug therapy: Secondary | ICD-10-CM

## 2020-07-12 LAB — TEST AUTHORIZATION

## 2020-07-12 LAB — COMPLETE METABOLIC PANEL WITH GFR
AG Ratio: 2.1 (calc) (ref 1.0–2.5)
ALT: 11 U/L (ref 6–29)
AST: 14 U/L (ref 10–35)
Albumin: 4.5 g/dL (ref 3.6–5.1)
Alkaline phosphatase (APISO): 96 U/L (ref 37–153)
BUN: 12 mg/dL (ref 7–25)
CO2: 28 mmol/L (ref 20–32)
Calcium: 9.3 mg/dL (ref 8.6–10.4)
Chloride: 105 mmol/L (ref 98–110)
Creat: 0.99 mg/dL (ref 0.50–1.05)
GFR, Est African American: 73 mL/min/{1.73_m2} (ref >=60)
GFR, Est Non African American: 63 mL/min/{1.73_m2} (ref >=60)
Globulin: 2.1 g/dL (calc) (ref 1.9–3.7)
Glucose, Bld: 94 mg/dL (ref 65–99)
Potassium: 4.2 mmol/L (ref 3.5–5.3)
Sodium: 140 mmol/L (ref 135–146)
Total Bilirubin: 0.3 mg/dL (ref 0.2–1.2)
Total Protein: 6.6 g/dL (ref 6.1–8.1)

## 2020-07-12 LAB — LIPID PANEL
Cholesterol: 238 mg/dL — ABNORMAL HIGH (ref <200)
HDL: 59 mg/dL (ref >=50)
LDL Cholesterol (Calc): 141 mg/dL (calc) — ABNORMAL HIGH
Non-HDL Cholesterol (Calc): 179 mg/dL (calc) — ABNORMAL HIGH (ref <130)
Total CHOL/HDL Ratio: 4 (calc) (ref <5.0)
Triglycerides: 234 mg/dL — ABNORMAL HIGH (ref <150)

## 2020-07-12 LAB — CBC
HCT: 39.6 % (ref 35.0–45.0)
Hemoglobin: 12.8 g/dL (ref 11.7–15.5)
MCH: 31.1 pg (ref 27.0–33.0)
MCHC: 32.3 g/dL (ref 32.0–36.0)
MCV: 96.4 fL (ref 80.0–100.0)
MPV: 10.7 fL (ref 7.5–12.5)
Platelets: 281 10*3/uL (ref 140–400)
RBC: 4.11 10*6/uL (ref 3.80–5.10)
RDW: 11.7 % (ref 11.0–15.0)
WBC: 8 10*3/uL (ref 3.8–10.8)

## 2020-07-12 LAB — HEMOGLOBIN A1C
Hgb A1c MFr Bld: 5 % of total Hgb (ref <5.7)
Mean Plasma Glucose: 97 (calc)
eAG (mmol/L): 5.4 (calc)

## 2020-07-12 LAB — HIV ANTIBODY (ROUTINE TESTING W REFLEX): HIV 1&2 Ab, 4th Generation: NONREACTIVE

## 2020-07-12 LAB — VITAMIN D 25 HYDROXY (VIT D DEFICIENCY, FRACTURES): Vit D, 25-Hydroxy: 85 ng/mL (ref 30–100)

## 2020-07-12 NOTE — Telephone Encounter (Signed)
Error

## 2020-07-12 NOTE — Telephone Encounter (Signed)
Copied from [place] [id number]. Topic: General - Inquiry >> Jul 11, 2020  4:24 PM Mathis Bud wrote: Reason for CRM: patient is wondering in recent labs she got done if she got a CBC done.  I did not see that she had that recently done. Wanted to verify. Call back [phone]

## 2020-07-12 NOTE — Telephone Encounter (Signed)
Patient has been notified that CBC was added to previous labs.

## 2020-07-13 LAB — CYTOLOGY - PAP
Comment: NEGATIVE
Diagnosis: NEGATIVE
High risk HPV: NEGATIVE

## 2020-07-18 ENCOUNTER — Telehealth: Payer: Self-pay | Admitting: Family Medicine

## 2020-07-18 NOTE — Telephone Encounter (Signed)
Pt states [facility] will not fill her cloZAPine (CLOZARIL) 100 MG tablet prescription until they get her last labs faxed to them, and a copy sent to Dr Clovis Pu. Pt states it is due to be picked up 8/22 and she is trying to be proactive.   [facility] [place], [place] Phone:  [phone]  Fax:  [phone]

## 2020-07-19 ENCOUNTER — Other Ambulatory Visit: Payer: Self-pay

## 2020-07-19 ENCOUNTER — Ambulatory Visit
Admission: RE | Admit: 2020-07-19 | Discharge: 2020-07-19 | Disposition: A | Discharge status: Home / Self Care | Payer: 59 | Source: Ambulatory Visit | Attending: Family Medicine | Admitting: Family Medicine

## 2020-07-19 DIAGNOSIS — N6324 Unspecified lump in the left breast, lower inner quadrant: Secondary | ICD-10-CM | POA: Diagnosis present

## 2020-07-25 ENCOUNTER — Telehealth: Payer: Self-pay | Admitting: Psychiatry

## 2020-07-25 NOTE — Telephone Encounter (Signed)
Please review labs if not already for her refill

## 2020-07-25 NOTE — Telephone Encounter (Signed)
Pt called requesting refill on Clozapine 100 mg tab @ [facility]. Labs in system.

## 2020-07-26 ENCOUNTER — Other Ambulatory Visit: Payer: Self-pay | Admitting: Psychiatry

## 2020-07-26 ENCOUNTER — Other Ambulatory Visit: Payer: Self-pay

## 2020-07-26 ENCOUNTER — Other Ambulatory Visit
Admission: RE | Admit: 2020-07-26 | Discharge: 2020-07-26 | Disposition: A | Discharge status: Home / Self Care | Payer: 59 | Attending: Psychiatry | Admitting: Psychiatry

## 2020-07-26 DIAGNOSIS — F250 Schizoaffective disorder, bipolar type: Secondary | ICD-10-CM | POA: Diagnosis present

## 2020-07-26 LAB — CBC WITH DIFFERENTIAL/PLATELET
Abs Immature Granulocytes: 0.03 10*3/uL (ref 0.00–0.07)
Basophils Absolute: 0.1 10*3/uL (ref 0.0–0.1)
Basophils Relative: 1 %
Eosinophils Absolute: 0.2 10*3/uL (ref 0.0–0.5)
Eosinophils Relative: 2 %
HCT: 38.1 % (ref 36.0–46.0)
Hemoglobin: 12.3 g/dL (ref 12.0–15.0)
Immature Granulocytes: 0 %
Lymphocytes Relative: 26 %
Lymphs Abs: 2.4 10*3/uL (ref 0.7–4.0)
MCH: 31.1 pg (ref 26.0–34.0)
MCHC: 32.3 g/dL (ref 30.0–36.0)
MCV: 96.5 fL (ref 80.0–100.0)
Monocytes Absolute: 0.6 10*3/uL (ref 0.1–1.0)
Monocytes Relative: 6 %
Neutro Abs: 6 10*3/uL (ref 1.7–7.7)
Neutrophils Relative %: 65 %
Platelets: 257 10*3/uL (ref 150–400)
RBC: 3.95 MIL/uL (ref 3.87–5.11)
RDW: 12 % (ref 11.5–15.5)
WBC: 9.2 10*3/uL (ref 4.0–10.5)
nRBC: 0 % (ref 0.0–0.2)

## 2020-07-26 NOTE — Telephone Encounter (Signed)
Labs reviewed and sent

## 2020-08-07 ENCOUNTER — Other Ambulatory Visit: Payer: Self-pay | Admitting: Family Medicine

## 2020-08-07 DIAGNOSIS — R000 Tachycardia, unspecified: Secondary | ICD-10-CM

## 2020-08-09 ENCOUNTER — Telehealth: Payer: Self-pay | Admitting: Psychiatry

## 2020-08-09 NOTE — Telephone Encounter (Signed)
Carla Thompson did not take her medication from Aug 29th until today. Was unable to get it. Need to discuss b/c she is not doing well, has mostly slept.

## 2020-08-10 NOTE — Telephone Encounter (Signed)
Please review. The issue was with her labs not having the differential from her PCP initially, then I informed her she had to repeat it, which she did. [facility] said they never received the lab results. Husband called Tuesday 08/08/2020 that she had been out of her medication, CBC was then faxed that day and patient finally was able to receive her refill of Clozapine.   When you document they will be faxed, who does that? It doesn't seem to be working well. Can I get notified as well?

## 2020-08-11 ENCOUNTER — Telehealth: Payer: Self-pay | Admitting: Psychiatry

## 2020-08-11 NOTE — Telephone Encounter (Signed)
This message was also asking if anything else can be done? Her husband called and reports she's not doing well. Please review

## 2020-08-11 NOTE — Telephone Encounter (Signed)
TC to H.  LM Lynder Parents, MD, DFAPA

## 2020-08-11 NOTE — Telephone Encounter (Signed)
TC   Called and spoke with patients brother and her husband at their request. Patient has been having trouble getting clozapine promptly after lab tests over the last several months. That was magnified this time by some misunderstanding about the difference between CBC and a CBC with diff on the part of the patient's family. Therefore the patient didn't get the CBC with diff the last time and didn't get her medicine on time. She was without medication for a week and began to have more mild psychotic symptoms at the end of that week. Since restarting the clozapine at full dose she's sleeping excessively and having some problems with balance and drowsiness. Her speech is somewhat thick.  Her husband was instructed to change her clozapine  all to the night time at continued full dose and reduce the morning clonazepam for panic by half. She'll continue the evening clonazepam at full dost to prevent withdrawal. They were cautioned about the risk of dehydration because she's sleeping so much she may not consume enough fluids and she's prone to dehydration which would further increase the fall risk. It would also increase the constipation risk. They're also to continue aggressive techniques previously learned to manage the constipation. If she does not improve they let us know. It is expected that she'll be excessively drowsy for the next several days. He understood these instructions and said he would implement them. the patient is helped by her husband with her medications.  H told to let us know if pt misses even 1 day of meds due to pharmacy problems or labs  Lynder Parents, MD, DFAPA

## 2020-08-17 NOTE — Telephone Encounter (Signed)
See other phone messages.  

## 2020-08-24 ENCOUNTER — Other Ambulatory Visit
Admission: RE | Admit: 2020-08-24 | Discharge: 2020-08-24 | Disposition: A | Discharge status: Home / Self Care | Payer: 59 | Attending: Psychiatry | Admitting: Psychiatry

## 2020-08-24 DIAGNOSIS — F250 Schizoaffective disorder, bipolar type: Secondary | ICD-10-CM | POA: Insufficient documentation

## 2020-08-24 LAB — CBC WITH DIFFERENTIAL/PLATELET
Abs Immature Granulocytes: 0.02 10*3/uL (ref 0.00–0.07)
Basophils Absolute: 0.1 10*3/uL (ref 0.0–0.1)
Basophils Relative: 1 %
Eosinophils Absolute: 0.1 10*3/uL (ref 0.0–0.5)
Eosinophils Relative: 2 %
HCT: 39.6 % (ref 36.0–46.0)
Hemoglobin: 12.5 g/dL (ref 12.0–15.0)
Immature Granulocytes: 0 %
Lymphocytes Relative: 21 %
Lymphs Abs: 1.7 10*3/uL (ref 0.7–4.0)
MCH: 30.9 pg (ref 26.0–34.0)
MCHC: 31.6 g/dL (ref 30.0–36.0)
MCV: 98 fL (ref 80.0–100.0)
Monocytes Absolute: 0.4 10*3/uL (ref 0.1–1.0)
Monocytes Relative: 5 %
Neutro Abs: 5.9 10*3/uL (ref 1.7–7.7)
Neutrophils Relative %: 71 %
Platelets: 306 10*3/uL (ref 150–400)
RBC: 4.04 MIL/uL (ref 3.87–5.11)
RDW: 11.9 % (ref 11.5–15.5)
WBC: 8.3 10*3/uL (ref 4.0–10.5)
nRBC: 0 % (ref 0.0–0.2)

## 2020-08-24 NOTE — Progress Notes (Signed)
I Faxed them to [facility] and her pharmacy today.

## 2020-08-28 ENCOUNTER — Other Ambulatory Visit: Payer: Self-pay | Admitting: Family Medicine

## 2020-08-28 DIAGNOSIS — E559 Vitamin D deficiency, unspecified: Secondary | ICD-10-CM

## 2020-08-30 ENCOUNTER — Telehealth: Payer: Self-pay

## 2020-08-30 ENCOUNTER — Ambulatory Visit: Payer: Self-pay | Admitting: *Deleted

## 2020-08-30 NOTE — Telephone Encounter (Signed)
Pt called with complaints of severe abdominal pain and not having a BM x 1.5 - 2 weeks; she took an enema on PM 08/29/20; the pt says she had a small pebble sized stool from the enema;  she says this is from the clozapine per Dr Clovis Pu; she has been having lower abdominal pain since 08/18/20; her pain is intermittent and she denies pain at this time; when in pain she rates it at 10 out of 10; her last dose of stool softener was 08/28/20 but she is not taken it daily; recommendations made per nurse triage; she verbalized understanding but would like to see Dr Ancil Boozer at [facility]; she can be contacted at [phone] or [phone] (husband's cell phone); will route to office for final disposition.  Reason for Disposition . [1] SEVERE pain (e.g., excruciating) AND [2] present > 1 hour  Answer Assessment - Initial Assessment Questions 1. LOCATION: "Where does it hurt?"      Lower abdomen 2. RADIATION: "Does the pain shoot anywhere else?" (e.g., chest, back)     back 3. ONSET: "When did the pain begin?" (e.g., minutes, hours or days ago)     08/18/20 4. SUDDEN: "Gradual or sudden onset?"   gradual 5. PATTERN "Does the pain come and go, or is it constant?"    - If constant: "Is it getting better, staying the same, or worsening?"      (Note: Constant means the pain never goes away completely; most serious pain is constant and it progresses)     - If intermittent: "How long does it last?" "Do you have pain now?"     (Note: Intermittent means the pain goes away completely between bouts)     intermittent 6. SEVERITY: "How bad is the pain?"  (e.g., Scale 1-10; mild, moderate, or severe)   - MILD (1-3): doesn't interfere with normal activities, abdomen soft and not tender to touch    - MODERATE (4-7): interferes with normal activities or awakens from sleep, tender to touch    - SEVERE (8-10): excruciating pain, doubled over, unable to do any normal activities    10 out of 10 on 09/27/20; no pain  currently 7. RECURRENT SYMPTOM: "Have you ever had this type of stomach pain before?" If Yes, ask: "When was the last time?" and "What happened that time?"     Yes due to constipation 8. CAUSE: "What do you think is causing the stomach pain?"   constipation 9. RELIEVING/AGGRAVATING FACTORS: "What makes it better or worse?" (e.g., movement, antacids, bowel movement)     Nothing makes better 10. OTHER SYMPTOMS: "Has there been any vomiting, diarrhea, constipation, or urine problems?"       Problems with urinating; "not urinating as much as I used to" 11. PREGNANCY: "Is there any chance you are pregnant?" "When was your last menstrual period?"       no  Protocols used: ABDOMINAL PAIN - [facility]

## 2020-08-30 NOTE — Telephone Encounter (Signed)
Copied from [place] [id number]. Topic: General - Inquiry >> Aug 29, 2020  3:29 PM Alease Frame wrote: Reason for CRM: Pt is needing a call back from [place] regarding weight loss from medication  . Please advise

## 2020-08-30 NOTE — Telephone Encounter (Signed)
Patient returning call.

## 2020-08-31 ENCOUNTER — Other Ambulatory Visit: Payer: Self-pay

## 2020-08-31 ENCOUNTER — Emergency Department: Payer: 59

## 2020-08-31 DIAGNOSIS — Z5321 Procedure and treatment not carried out due to patient leaving prior to being seen by health care provider: Secondary | ICD-10-CM | POA: Insufficient documentation

## 2020-08-31 DIAGNOSIS — R109 Unspecified abdominal pain: Secondary | ICD-10-CM | POA: Diagnosis present

## 2020-08-31 DIAGNOSIS — K5900 Constipation, unspecified: Secondary | ICD-10-CM | POA: Diagnosis not present

## 2020-08-31 LAB — CBC
HCT: 38.6 % (ref 36.0–46.0)
Hemoglobin: 13.2 g/dL (ref 12.0–15.0)
MCH: 31.9 pg (ref 26.0–34.0)
MCHC: 34.2 g/dL (ref 30.0–36.0)
MCV: 93.2 fL (ref 80.0–100.0)
Platelets: 280 10*3/uL (ref 150–400)
RBC: 4.14 MIL/uL (ref 3.87–5.11)
RDW: 11.9 % (ref 11.5–15.5)
WBC: 13.3 10*3/uL — ABNORMAL HIGH (ref 4.0–10.5)
nRBC: 0 % (ref 0.0–0.2)

## 2020-08-31 LAB — BASIC METABOLIC PANEL
Anion gap: 12 (ref 5–15)
BUN: 10 mg/dL (ref 6–20)
CO2: 21 mmol/L — ABNORMAL LOW (ref 22–32)
Calcium: 9 mg/dL (ref 8.9–10.3)
Chloride: 106 mmol/L (ref 98–111)
Creatinine, Ser: 0.9 mg/dL (ref 0.44–1.00)
GFR calc Af Amer: >60 mL/min (ref >60)
GFR calc non Af Amer: >60 mL/min (ref >60)
Glucose, Bld: 109 mg/dL — ABNORMAL HIGH (ref 70–99)
Potassium: 3.7 mmol/L (ref 3.5–5.1)
Sodium: 139 mmol/L (ref 135–145)

## 2020-08-31 LAB — LIPASE, BLOOD: Lipase: 23 U/L (ref 11–51)

## 2020-08-31 NOTE — ED Notes (Signed)
Pt called X 2 no answer.

## 2020-08-31 NOTE — ED Triage Notes (Signed)
Pt refferred here by her primary for constipation and abd pain. Her last BM was around a week ago. Pt NAD in triage.

## 2020-08-31 NOTE — Telephone Encounter (Signed)
Called patient back again. No answer, no vm. Please ask her what her concerns are if she calls back.

## 2020-08-31 NOTE — ED Notes (Signed)
Pt called X 1 no answer.

## 2020-09-01 ENCOUNTER — Emergency Department
Admission: EM | Admit: 2020-09-01 | Discharge: 2020-09-01 | Disposition: A | Discharge status: Home / Self Care | Payer: 59 | Attending: Emergency Medicine | Admitting: Emergency Medicine

## 2020-09-01 ENCOUNTER — Encounter (INDEPENDENT_AMBULATORY_CARE_PROVIDER_SITE_OTHER): Payer: 59 | Admitting: Family Medicine

## 2020-09-01 NOTE — ED Notes (Signed)
No answer when called several times from lobby 

## 2020-09-01 NOTE — Telephone Encounter (Signed)
She went to the ED yesterday. She feels a lot better than before. She took a glycerin suppository after she went to ED. Did x-rays on abdomen and checked blood pressure.

## 2020-09-04 ENCOUNTER — Ambulatory Visit (INDEPENDENT_AMBULATORY_CARE_PROVIDER_SITE_OTHER): Payer: 59 | Admitting: Family Medicine

## 2020-09-04 ENCOUNTER — Other Ambulatory Visit: Payer: Self-pay

## 2020-09-04 ENCOUNTER — Telehealth: Payer: Self-pay

## 2020-09-04 ENCOUNTER — Encounter: Payer: Self-pay | Admitting: Family Medicine

## 2020-09-04 VITALS — BP 110/78 | HR 90 | Temp 98.4°F | Resp 14 | Ht 64.0 in | Wt 130.8 lb

## 2020-09-04 DIAGNOSIS — R159 Full incontinence of feces: Secondary | ICD-10-CM | POA: Diagnosis not present

## 2020-09-04 DIAGNOSIS — Z09 Encounter for follow-up examination after completed treatment for conditions other than malignant neoplasm: Secondary | ICD-10-CM

## 2020-09-04 DIAGNOSIS — K5909 Other constipation: Secondary | ICD-10-CM | POA: Diagnosis not present

## 2020-09-04 MED ORDER — POLYETHYLENE GLYCOL 3350 17 GM/SCOOP PO POWD: 17.0000 g | Freq: Two times a day (BID) | ORAL | 1 refills | Status: DC | PRN

## 2020-09-04 NOTE — Telephone Encounter (Signed)
Called patient back on Friday, told her since seen in ER same day that she would not be able to see [place] as well because insurance would not pay.  Pt told to be active drink plenty of water and take OTC meds for constipation including miralax.

## 2020-09-04 NOTE — Progress Notes (Signed)
Patient ID: Carla Thompson, female    DOB: 26-Mar-1961, 59 y.o.   MRN: [id number]  PCP: Steele Sizer, MD  Chief Complaint  Patient presents with  . Constipation    seen at ER    Subjective:   Carla Thompson is a 60 y.o. female, presents to clinic with CC of the following:  Constipation This is a chronic problem. The problem has been gradually worsening since onset. The patient is not on a high fiber diet. She exercises regularly (exercises 2x a day - walking). There has been adequate water intake (she has increased it in the past couple days). Associated symptoms include abdominal pain, anorexia, bloating, fecal incontinence, flatus and rectal pain. Pertinent negatives include no back pain, diarrhea, difficulty urinating, fever, hematochezia, hemorrhoids, melena, nausea, vomiting or weight loss. Risk factors: hx of surgery and med se. She has tried manual disimpaction, stool softeners, laxatives and enemas for the symptoms. The treatment provided no relief.     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 On Friday after ER visit the multiple over-the-counter and home remedies and assistance by her nurse friend she also took Senokot and had about 4-5 BM's no bowel movement since then  Diet:  Oatmeal and sandwiches for lunch She's pushed fluids - drank 5 water bottles yesterday  Unintentional weight loss Labs and Xray from ER on Friday reviewed  Wt Readings from Last 5 Encounters:  09/04/20 130 lb 12.8 oz (59.3 kg)  08/31/20 130 lb (59 kg)  07/11/20 139 lb 6.4 oz (63.2 kg)  06/21/19 139 lb 6.4 oz (63.2 kg)  12/14/18 141 lb 1.6 oz (64 kg)   BMI Readings from Last 5 Encounters:  09/04/20 22.45 kg/m  08/31/20 22.31 kg/m    07/11/20 23.93 kg/m  06/21/19 23.93 kg/m  12/14/18 24.22 kg/m     Patient Active Problem List   Diagnosis Date Noted  . Hyperlipidemia 11/20/2017  . GERD without esophagitis 02/21/2017  . Tachycardia 02/20/2016  . RLS (restless legs syndrome) 12/21/2015  . IBS (irritable bowel syndrome) 12/21/2015  . Other long term (current) drug therapy 12/08/2015  . Chronic constipation 12/08/2015  . Insomnia, persistent 12/08/2015  . Dermatitis, eczematoid 12/08/2015  . Gravida 2 para 2 12/08/2015  . Hypertriglyceridemia 12/08/2015  . Metallic taste 09/32/6712  . 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 Schizoaffective disorder, bipolar type ([place]) 12/08/2015      Current Outpatient Medications:  .  aspirin (ASPIRIN LOW DOSE) 81 MG EC tablet, Take 1 tablet (81 mg total) by mouth daily. Swallow whole., Disp: 30 tablet, Rfl: 12 .  B Complex-C (SUPER B COMPLEX PO), Take 1 tablet by mouth daily., Disp: , Rfl:  .  CINNAMON PO, Take by mouth., Disp: , Rfl:  .  clonazePAM (KLONOPIN) 0.5 MG tablet, TAKE 1 TABLET BY MOUTH EVERY MORNING, AND 1 AND 1/2 TABLETS AT BEDTIME, Disp: 75 tablet, Rfl: 5 .  cloZAPine (CLOZARIL) 100 MG tablet, TAKE 1 TABLET BY MOUTH EVERY MORNING, AND 3 EVERY NIGHT AT BEDTIME, Disp: 120 tablet, Rfl: 6 .  LINZESS 145 MCG CAPS capsule, Take 145 mcg by mouth daily., Disp: , Rfl:  .  lithium 300 MG tablet, TAKE 2 TABLETS(600 MG) BY MOUTH AT BEDTIME, Disp:  180 tablet, Rfl: 1 .  magnesium oxide (MAG-OX) 400 MG tablet, Take 400 mg by mouth daily., Disp: , Rfl:  .  metoprolol tartrate (LOPRESSOR) 25 MG tablet, TAKE 1/2 TABLET(12.5 MG) BY MOUTH TWICE DAILY, Disp: 90 tablet, Rfl: 1 .  Pyridoxine HCl (VITAMIN B-6) 500 MG tablet, Take 500 mg by mouth 2 (two) times daily., Disp: , Rfl:  .  vitamin C (ASCORBIC ACID) 500 MG tablet, Take 500 mg by mouth daily., Disp: , Rfl:  .  Vitamin D, Ergocalciferol, (DRISDOL) 1.25 MG (50000 UNIT) CAPS capsule, TAKE ONE CAPSULE BY MOUTH WEEKLY, Disp: 12  capsule, Rfl: 0 .  vitamin E 1000 UNIT capsule, Take 1,000 Units by mouth daily. , Disp: , Rfl:  .  docusate sodium (STOOL SOFTENER) 100 MG capsule, Take 100 mg by mouth daily as needed for mild constipation.  (Patient not taking: Reported on 09/04/2020), Disp: , Rfl:    Allergies  Allergen Reactions  . Escitalopram Hives  . Levsin [Hyoscyamine Sulfate] Other (See Comments)    Reaction:  Patient took medication and felt really spacey     Social History   Tobacco Use  . Smoking status: Never Smoker  . Smokeless tobacco: Never Used  Vaping Use  . Vaping Use: Never used  Substance Use Topics  . Alcohol use: No    Alcohol/week: 0.0 standard drinks  . Drug use: No      Chart Review Today: I personally reviewed active problem list, medication list, allergies, family history, social history, health maintenance, notes from last encounter, lab results, imaging with the patient/caregiver today.   Review of Systems  Constitutional: Negative.  Negative for fever and weight loss.  HENT: Negative.   Eyes: Negative.   Respiratory: Negative.   Cardiovascular: Negative.   Gastrointestinal: Positive for abdominal pain, anorexia, bloating, constipation, flatus and rectal pain. Negative for diarrhea, hematochezia, hemorrhoids, melena, nausea and vomiting.  Endocrine: Negative.   Genitourinary: Negative.  Negative for difficulty urinating.  Musculoskeletal: Negative.  Negative for back pain.  Skin: Negative.   Allergic/Immunologic: Negative.   Neurological: Negative.   Hematological: Negative.   Psychiatric/Behavioral: Negative.   All other systems reviewed and are negative.      Objective:   Vitals:   09/04/20 1007  BP: 110/78  Pulse: 90  Resp: 14  Temp: 98.4 F (36.9 C)  TempSrc: Oral  SpO2: 98%  Weight: 130 lb 12.8 oz (59.3 kg)  Height: 5\' 4"  (1.626 m)    Body mass index is 22.45 kg/m.  Physical Exam Vitals and nursing note reviewed.  Constitutional:      General:  She is not in acute distress.    Appearance: She is not ill-appearing, toxic-appearing or diaphoretic.  HENT:     Head: Normocephalic and atraumatic.     Right Ear: External ear normal.     Left Ear: External ear normal.  Eyes:     General: No scleral icterus.       Right eye: No discharge.        Left eye: No discharge.     Conjunctiva/sclera: Conjunctivae normal.  Cardiovascular:     Rate and Rhythm: Normal rate and regular rhythm.     Pulses: Normal pulses.     Heart sounds: Normal heart sounds.  Pulmonary:     Effort: Pulmonary effort is normal.     Breath sounds: Normal breath sounds.  Abdominal:     General: Abdomen is protuberant. Bowel sounds are normal. There is no distension.  Palpations: Abdomen is soft. There is no pulsatile mass.     Tenderness: There is generalized abdominal tenderness. There is no guarding or rebound.     Hernia: No hernia is present.  Skin:    Coloration: Skin is pale. Skin is not jaundiced.  Neurological:     Mental Status: She is alert.      Results for orders placed or performed during the hospital encounter of 09/01/20  CBC  Result Value Ref Range   WBC 13.3 (H) 4.0 - 10.5 K/uL   RBC 4.14 3.87 - 5.11 MIL/uL   Hemoglobin 13.2 12.0 - 15.0 g/dL   HCT 38.6 36 - 46 %   MCV 93.2 80.0 - 100.0 fL   MCH 31.9 26.0 - 34.0 pg   MCHC 34.2 30.0 - 36.0 g/dL   RDW 11.9 11.5 - 15.5 %   Platelets 280 150 - 400 K/uL   nRBC 0.0 0.0 - 0.2 %  Basic metabolic panel  Result Value Ref Range   Sodium 139 135 - 145 mmol/L   Potassium 3.7 3.5 - 5.1 mmol/L   Chloride 106 98 - 111 mmol/L   CO2 21 (L) 22 - 32 mmol/L   Glucose, Bld 109 (H) 70 - 99 mg/dL   BUN 10 6 - 20 mg/dL   Creatinine, Ser 0.90 0.44 - 1.00 mg/dL   Calcium 9.0 8.9 - 10.3 mg/dL   GFR calc non Af Amer >60 >60 mL/min   GFR calc Af Amer >60 >60 mL/min   Anion gap 12 5 - 15  Lipase, blood  Result Value Ref Range   Lipase 23 11 - 51 U/L   CLINICAL DATA:  Constipation.  EXAM: ABDOMEN  - 1 VIEW  COMPARISON:  None.  FINDINGS: No abnormal bowel dilatation is noted. Large amount of stool seen throughout the colon and rectum. No radio-opaque calculi or other significant radiographic abnormality are seen.  IMPRESSION: Large stool burden is noted. No evidence of bowel obstruction or ileus.   Electronically Signed   By: Marijo Conception M.D.   On: 08/31/2020 17:15    Assessment & Plan:     ICD-10-CM   1. Chronic constipation  K59.09 polyethylene glycol powder (GLYCOLAX/MIRALAX) 17 GM/SCOOP powder    Ambulatory referral to Gastroenterology  2. Incontinence of feces, unspecified fecal incontinence type  R15.9 Ambulatory referral to Gastroenterology  3. Encounter for examination following treatment at hospital  Z09    reviewed ER visit, labs, xrays    Reviewed new meds, and diet and lifestyle changes that may help her chronic constipation For now hold linzess and do more miralax until having softer stools more often  Reviewed fecal impaction sx and ER precautions.  Her abd bloating is at baseline pt states, she had multiple bowel movements on Friday and has not had a bowel movement since however this is also her baseline for several years and decades she states  She is tolerating PO's, has no distention, bloating, nausea, vomiting, VSS.  Suspect this is more of a chronic issue rather than an acute worsening.  Encouraged her to follow-up with her psychiatrist regarding her medication side effect concerns she may be able to try other medications that may not cause much constipation. Patient's past surgical history may also be a factor Most concerning to me is leakage of stool which may be improved if she is able to evacuate bowels and work on better diet and medication management to avoid any fecal impaction in the future.  Delsa Grana, PA-C 09/04/20 10:19 AM

## 2020-09-04 NOTE — Patient Instructions (Addendum)
Constipation, Adult  Constipation is when a person:  Poops (has a bowel movement) fewer times in a week than normal.  Has a hard time pooping.  Has poop that is dry, hard, or bigger than normal. Follow these instructions at home: Eating and drinking   Eat foods that have a lot of fiber, such as: ? Fresh fruits and vegetables. ? Whole grains. ? Beans.  Eat less of foods that are high in fat, low in fiber, or overly processed, such as: ? [place] fries. ? Hamburgers. ? Cookies. ? Candy. ? Soda.  Drink enough fluid to keep your pee (urine) clear or pale yellow. General instructions  Exercise regularly or as told by your doctor.  Go to the restroom when you feel like you need to poop. Do not hold it in.  Take over-the-counter and prescription medicines only as told by your doctor. These include any fiber supplements.  Do pelvic floor retraining exercises, such as: ? Doing deep breathing while relaxing your lower belly (abdomen). ? Relaxing your pelvic floor while pooping.  Watch your condition for any changes.  Keep all follow-up visits as told by your doctor. This is important. Contact a doctor if:  You have pain that gets worse.  You have a fever.  You have not pooped for 4 days.  You throw up (vomit).  You are not hungry.  You lose weight.  You are bleeding from the anus.  You have thin, pencil-like poop (stool). Get help right away if:  You have a fever, and your symptoms suddenly get worse.  You leak poop or have blood in your poop.  Your belly feels hard or bigger than normal (is bloated).  You have very bad belly pain.  You feel dizzy or you faint. This information is not intended to replace advice given to you by your health care provider. Make sure you discuss any questions you have with your health care provider. Document Revised: 10/31/2017 Document Reviewed: 05/08/2016 Elsevier Patient Education  [place].   Fecal  Impaction  A fecal impaction is a large, firm amount of stool (feces) that will not pass out of the body. A fecal impaction usually happens in the rectum, which is in the end of the large intestine. A fecal impaction can block the large intestine and cause significant problems. What are the causes? This condition may be caused by anything that slows down bowel movements, including:  The long-term use of laxatives, which are medicines that help you have a bowel movement.  Constipation.  Pain in the rectum. Fecal impaction can occur if you avoid having bowel movements due to the pain. Pain in the rectum can result from a medical condition, such as hemorrhoids or anal fissures.  Narcotic pain-relieving medicines, such as methadone, morphine, or codeine.  Not drinking enough fluids.  Being inactive for a long period of time.  Diseases of the brain or nervous system that damage nerves that control the muscles of the intestines. This condition may also be caused by dementia. What are the signs or symptoms? Common symptoms of this condition include:  A sense of fullness in the rectum but being unable to pass stool.  Changes in bowel patterns. This may include going to the bathroom less often or not at all.  Not having a normal number of bowel movements.  Thin, watery discharge from the rectum.  Pain or cramps in the abdominal area. These often happen after meals. Other symptoms include:  Urinating often.  Nausea, vomiting, and dehydration.  Dizziness and confusion.  Rapid heartbeat.  Fever and sweating.  Changes in blood pressure.  Breathing problems. How is this diagnosed? This condition may be diagnosed based on your symptoms and an exam of your rectum. Sometimes, X-rays or lab tests are done to confirm the diagnosis and to check for other problems. How is this treated? This condition may be treated by:  Having your health care provider remove the stool using a gloved  finger.  Taking medicine.  Using a suppository or enema in the rectum to soften the stool, which can stimulate a bowel movement. Follow these instructions at home: Eating and drinking   Drink enough fluid to keep your urine pale yellow.  Eat foods that are high in fiber, such as beans, whole grains, and fresh fruits and vegetables.  If you begin to get constipated, increase the amount of fiber in your diet. General instructions  Develop bowel habits. An example of a bowel habit is having a bowel movement right after breakfast every day. Be sure to give yourself enough time on the toilet. This may require using enemas, bowel softeners, or suppositories at home, as directed by your health care provider. It may also include using mineral oil or olive oil.  Do exercises as told by your health care provider.  Take over-the-counter and prescription medicines only as told by your health care provider.  Keep all follow-up visits as told by your health care provider. This is important. Contact a health care provider if you:  Have ongoing pain in your rectum.  Need to use an enema or a suppository more than 2 times a week.  Have rectal bleeding.  Continue to have problems. The problems may include not being able to go to the bathroom and having long-term constipation.  Have pain in your abdomen.  Have thin, pencil-like stools. Get help right away if:  You have black or tarry stools. Summary  A fecal impaction is a large, firm amount of stool (feces) that will not pass out of the body. A fecal impaction can block the large intestine and cause significant problems.  This condition may be caused by anything that slows down bowel movements.  This condition may be treated by having your health care provider remove the stool using a gloved finger, taking medicine, or using a suppository or enema in the rectum to soften the stool.  Develop bowel habits and eat foods that are high in  fiber, such as beans, whole grains, and fresh fruits and vegetables.  Contact a health care provider if you continue to have problems. The problems may include not being able to go to the bathroom and having long-term constipation. This information is not intended to replace advice given to you by your health care provider. Make sure you discuss any questions you have with your health care provider. Document Revised: 05/18/2019 Document Reviewed: 05/18/2019 Elsevier Patient Education  [place].

## 2020-09-06 ENCOUNTER — Telehealth: Payer: Self-pay

## 2020-09-06 NOTE — Telephone Encounter (Signed)
Copied from [place] [id number]. Topic: General - Other >> Sep 06, 2020  4:25 PM Leward Quan A wrote: Reason for CRM: Patient called to inform Dr Ancil Boozer that she drinks about 4 bottles of water daily but has very little urinary output. Per patient she will start the Mirilax very soon. Asking if Dr Ancil Boozer can give her a call back please at Ph# [phone]

## 2020-09-06 NOTE — Progress Notes (Signed)
No visit done - due to ER visit

## 2020-09-07 ENCOUNTER — Other Ambulatory Visit: Payer: Self-pay | Admitting: Family Medicine

## 2020-09-07 ENCOUNTER — Ambulatory Visit (INDEPENDENT_AMBULATORY_CARE_PROVIDER_SITE_OTHER): Payer: 59 | Admitting: Psychiatry

## 2020-09-07 ENCOUNTER — Other Ambulatory Visit: Payer: Self-pay

## 2020-09-07 ENCOUNTER — Encounter: Payer: Self-pay | Admitting: Psychiatry

## 2020-09-07 DIAGNOSIS — F250 Schizoaffective disorder, bipolar type: Secondary | ICD-10-CM

## 2020-09-07 DIAGNOSIS — Z79899 Other long term (current) drug therapy: Secondary | ICD-10-CM | POA: Diagnosis not present

## 2020-09-07 DIAGNOSIS — R000 Tachycardia, unspecified: Secondary | ICD-10-CM

## 2020-09-07 DIAGNOSIS — K5909 Other constipation: Secondary | ICD-10-CM | POA: Diagnosis not present

## 2020-09-07 DIAGNOSIS — F4001 Agoraphobia with panic disorder: Secondary | ICD-10-CM | POA: Diagnosis not present

## 2020-09-07 NOTE — Progress Notes (Signed)
Carla Thompson [id number] 1961-07-19 59 y.o.  Subjective:   Patient ID:  Carla Thompson is a 59 y.o. (DOB 09/15/1961) female.  Chief Complaint:  Chief Complaint  Patient presents with  . Follow-up    psychosis  . Anxiety  . Medication Problem    SE and availability    HPI [name] presents to the office today for follow-up of schizoaffective disorder.  visit October, 2020.  No med changes.  She continued on clozapine 400, lithium 600, and clonazepam 0.5 twice daily as needed.   03/09/20 appt noted: StepF had been sick with Covid and other problems.  Died.  Other family members with Covid problems too and died. [name] worked from home 11-19-23 to April  Good overall without complaints.  Stress dealing with Covid.  Trying to stay active including cooking.  No major episodes of confusion nor voices.    No fear.  Does emails on the computer.  Not much anxiety lately.  Sleep same 9-9.  No fear episodes.  No manic nor depressive episodes.  No significant panic attacks recently.  She is not avoiding things out of anxiety generally.  She's still active at church.   Large infrequent BM clog toilet and intermittently not well managed.  Plan: Still problems with large infrequent q 4-7 days, large BM.  Often clogs the toilet.  Not well managed. Increase Linzess trial to 290 mg daily to see if it's better managed.  09/07/20 appt with following noted: She has notes. Balance problems, movements in feet legs, stomach.  Marching movements in feet.  Scares her. 1 fall on stairs going up. Wonders about reducing clozapine to 3 instead of 4 bc of tiredness. B researched lithium and said lithium and clozapine are "bad medicines with a lot of SE".   she's worried about SE. Disc ran out of clozapine for 2 weeks and decompensated.  back on it for almost a month.  Pharmacy was saying they couldn't dispense clozapine without labs and they didn't have the labs.  H says she's not back to normal yet bc  can't focus and make simple decisions.  For example couldn't decide whether to drink water out of the faucet or out of Brita water filter.  Lost 10# and not eating well.  Poor appetite.   Severely constipated chronically.  Using miralax.   Oldest daughter married March 2020  On clozapine for many years with benefit.  Several psych hospitalizations.  She is failed multiple other psychiatric medications which is why she is on clozapine.  She is also failed or relapsed with dosage reduction of clozapine or if she missed clozapine.  She is consistent with her dosage now.  Past Psychiatric Medication Trials: Citrucil, Miralax, stool softener, Mg tablets  Sister has schizophrenia.  Review of Systems:  Review of Systems  Gastrointestinal: Positive for constipation and diarrhea. Negative for abdominal pain.  Neurological: Negative for tremors.  Psychiatric/Behavioral: Positive for confusion and decreased concentration. Negative for agitation, behavioral problems, dysphoric mood, hallucinations, self-injury, sleep disturbance and suicidal ideas. The patient is not nervous/anxious and is not hyperactive.   Constipation comes and goes  Medications: I have reviewed the patient's current medications.  Current Outpatient Medications  Medication Sig Dispense Refill  . cloZAPine (CLOZARIL) 100 MG tablet TAKE 1 TABLET BY MOUTH EVERY MORNING, AND 3 EVERY NIGHT AT BEDTIME (Patient taking differently: Take 400 mg by mouth at bedtime. ) 120 tablet 6  . docusate sodium (STOOL SOFTENER) 100 MG capsule Take 100  mg by mouth daily.     Marland Kitchen LINZESS 145 MCG CAPS capsule Take 145 mcg by mouth daily.    Marland Kitchen lithium 300 MG tablet TAKE 2 TABLETS(600 MG) BY MOUTH AT BEDTIME 180 tablet 1  . metoprolol tartrate (LOPRESSOR) 25 MG tablet TAKE 1/2 TABLET(12.5 MG) BY MOUTH TWICE DAILY (Patient taking differently: Take 12.5 mg by mouth in the morning and at bedtime. TAKE 1/2 TABLET(12.5 MG) BY MOUTH TWICE DAILY) 90 tablet 1  .  polyethylene glycol powder (GLYCOLAX/MIRALAX) 17 GM/SCOOP powder Take 17 g by mouth 2 (two) times daily as needed (constipation). 578 g 1  . Vitamin D, Ergocalciferol, (DRISDOL) 1.25 MG (50000 UNIT) CAPS capsule TAKE ONE CAPSULE BY MOUTH WEEKLY 12 capsule 0  . clonazePAM (KLONOPIN) 0.5 MG tablet TAKE 1 AND 1/2 TABLETS  BY MOUTH EVERY MORNING, AND 1 AND 1/2 TABLETS AT BEDTIME 90 tablet 5   No current facility-administered medications for this visit.    Medication Side Effects: Other: alternating contstipation and diarrhea but has IBS  Allergies:  Allergies  Allergen Reactions  . Levsin [Hyoscyamine Sulfate] Hives  . Escitalopram Hives    Past Medical History:  Diagnosis Date  . Abnormal perimenopausal bleeding   . Bowel incontinence   . Chronic constipation   . Dermatitis   . High risk medication use   . Hyperglycemia   . Hypertriglyceridemia   . Iron deficiency anemia due to chronic blood loss    secondary to heavy flow  . Metallic taste   . Schizoaffective disorder, bipolar type ([place])    Dr. Tyrone Sage    Family History  Problem Relation Age of Onset  . Mental illness Sister        Schizophrenia and bipolar  . Cancer Maternal Grandmother        Colon  . Mental illness Paternal Grandmother        Schizophrenia  . Breast cancer Neg Hx     Social History   Socioeconomic History  . Marital status: Married    Spouse name: [name]  . Number of children: 2  . Years of education: Not on file  . Highest education level: Bachelor's degree (e.g., BA, AB, BS)  Occupational History  . Not on file  Tobacco Use  . Smoking status: Never Smoker  . Smokeless tobacco: Never Used  Vaping Use  . Vaping Use: Never used  Substance and Sexual Activity  . Alcohol use: No    Alcohol/week: 0.0 standard drinks  . Drug use: No  . Sexual activity: Not Currently    Partners: Male  Other Topics Concern  . Not on file  Social History Narrative  . Not on file   Social  Determinants of Health   Financial Resource Strain: Low Risk   . Difficulty of Paying Living Expenses: Not hard at all  Food Insecurity: No Food Insecurity  . Worried About Charity fundraiser in the Last Year: Never true  . Ran Out of Food in the Last Year: Never true  Transportation Needs: No Transportation Needs  . Lack of Transportation (Medical): No  . Lack of Transportation (Non-Medical): No  Physical Activity: Insufficiently Active  . Days of Exercise per Week: 7 days  . Minutes of Exercise per Session: 10 min  Stress: No Stress Concern Present  . Feeling of Stress : Not at all  Social Connections: Socially Integrated  . Frequency of Communication with Friends and Family: More than three times a week  . Frequency of Social  Gatherings with Friends and Family: More than three times a week  . Attends Religious Services: More than 4 times per year  . Active Member of Clubs or Organizations: Yes  . Attends Archivist Meetings: More than 4 times per year  . Marital Status: Married  Human resources officer Violence: Not At Risk  . Fear of Current or Ex-Partner: No  . Emotionally Abused: No  . Physically Abused: No  . Sexually Abused: No    Past Medical History, Surgical history, Social history, and Family history were reviewed and updated as appropriate.   Please see review of systems for further details on the patient's review from today.   Objective:   Physical Exam:  LMP 12/16/2016 (Approximate)   Physical Exam Constitutional:      General: She is not in acute distress.    Appearance: She is well-developed.  Musculoskeletal:        General: No deformity.  Neurological:     Mental Status: She is alert and oriented to person, place, and time.     Cranial Nerves: No dysarthria.     Coordination: Coordination normal.  Psychiatric:        Attention and Perception: Perception normal. She is inattentive. She does not perceive auditory or visual hallucinations.         Mood and Affect: Mood is anxious. Mood is not depressed. Affect is not labile, blunt, angry, tearful or inappropriate.        Speech: Speech is tangential. Speech is not rapid and pressured or slurred.        Behavior: Behavior normal. Behavior is cooperative.        Thought Content: Thought content is paranoid. Thought content is not delusional. Thought content does not include homicidal or suicidal ideation. Thought content does not include homicidal or suicidal plan.        Cognition and Memory: Cognition is impaired. She exhibits impaired recent memory.     Comments: Insight & judgment still fair. Scattered thought chronically with some hyperverbal and overinclusiveness.  More obsessiveness and more negative today and more worried.  Admits she's felt she was dying but can't say why.      Lab Review:     Component Value Date/Time   NA 139 09/11/2020 2000   NA 140 01/18/2016 1418   NA 139 08/27/2013 0930   K 3.8 09/11/2020 2000   K 4.2 08/27/2013 0930   CL 102 09/11/2020 2000   CL 108 (H) 08/27/2013 0930   CO2 28 09/11/2020 2000   CO2 28 08/27/2013 0930   GLUCOSE 112 (H) 09/11/2020 2000   GLUCOSE 98 08/27/2013 0930   BUN 11 09/11/2020 2000   BUN 17 01/18/2016 1418   BUN 11 08/27/2013 0930   CREATININE 1.00 09/11/2020 2000   CREATININE 0.99 07/11/2020 1558   CALCIUM 9.5 09/11/2020 2000   CALCIUM 8.5 08/27/2013 0930   PROT 7.6 09/11/2020 2000   PROT 6.6 01/18/2016 1418   PROT 7.2 08/27/2013 0930   ALBUMIN 4.3 09/11/2020 2000   ALBUMIN 4.4 01/18/2016 1418   ALBUMIN 3.5 08/27/2013 0930   AST 15 09/11/2020 2000   AST 20 08/27/2013 0930   ALT 14 09/11/2020 2000   ALT 19 08/27/2013 0930   ALKPHOS 95 09/11/2020 2000   ALKPHOS 107 08/27/2013 0930   BILITOT 0.5 09/11/2020 2000   BILITOT <0.2 01/18/2016 1418   BILITOT 0.3 08/27/2013 0930   GFRNONAA >60 09/11/2020 2000   GFRNONAA 63 07/11/2020 1558   GFRAA >  60 08/31/2020 1633   GFRAA 73 07/11/2020 1558       Component  Value Date/Time   WBC 12.9 (H) 09/11/2020 2000   RBC 4.18 09/11/2020 2000   HGB 12.9 09/11/2020 2000   HGB 12.3 01/23/2016 1231   HCT 40.4 09/11/2020 2000   HCT 38.6 01/23/2016 1231   PLT 291 09/11/2020 2000   PLT 280 01/23/2016 1231   MCV 96.7 09/11/2020 2000   MCV 95 01/23/2016 1231   MCV 92 03/24/2015 1244   MCH 30.9 09/11/2020 2000   MCHC 31.9 09/11/2020 2000   RDW 12.3 09/11/2020 2000   RDW 13.7 01/23/2016 1231   RDW 13.1 03/24/2015 1244   LYMPHSABS 2.4 09/11/2020 2000   LYMPHSABS 2.4 01/23/2016 1231   LYMPHSABS 2.2 03/24/2015 1244   MONOABS 0.7 09/11/2020 2000   MONOABS 0.4 03/24/2015 1244   EOSABS 0.2 09/11/2020 2000   EOSABS 0.1 01/23/2016 1231   EOSABS 0.4 03/24/2015 1244   BASOSABS 0.0 09/11/2020 2000   BASOSABS 0.0 01/23/2016 1231   BASOSABS 0.0 03/24/2015 1244    Lithium Lvl  Date Value Ref Range Status  09/12/2020 0.46 (L) 0.60 - 1.20 mmol/L Final    Comment:    Performed at [facility], [address], [place], [place] [address]     Lab Results  Component Value Date   VALPROATE 102 (H) 12/26/2015     .res Assessment: Plan:    Schizoaffective disorder, bipolar type ([place]) - Plan: Lithium level, CANCELED: Lithium level  Panic disorder with agoraphobia  Chronic constipation  Long term current use of clozapine   Overall her schizoaffective disorder was under control.  Until she ran out of clozapine bc of difficulty getting it.  This led to more disorganziation, confusion, anxiety and rumination.  Not self aware about overinclusiveness.  Discussed the importance of consistent with safety with the clozapine.  If they have difficulty obtaining it in the future please contact us right away.  She missed an off of the clozapine to cause disorganization and psychotic symptoms as well as anxiety.  It would take a while for that to resolve.  Panic disorder controlled.  Lithium level ordered 0.46 low normal.   ANC stable the last 6 months.  Continue clozapine.   Disc vaccines fears.  Continue lab test per usual.  Discussed the risks and benefits of perhaps schedule ruling out the clozapine levels a little less frequently per the current protocol of the clozapine clinic at [facility].    Still problems with large infrequent q 4-7 days, large BM.  Often clogs the toilet.  Not well managed. Previous Increase Linzess trial to 290 mg daily to see if it's better managed but she has been inconsistent with this.  Disc SE. Rec continue consulting PCP or GI about this chronic problem  This appt was 30 mins.  FU 6 weeks  Lynder Parents, MD, DFAPA   Please see After Visit Summary for patient specific instructions.  Future Appointments  Date Time Provider [place]  11/09/2020  4:15 PM Cottle, Billey Co., MD [facility] None  11/15/2020  9:00 AM [name], MD [facility] None  01/12/2021  2:40 PM Steele Sizer, MD [place] [facility]    Orders Placed This Encounter  Procedures  . Lithium level      -------------------------------

## 2020-09-07 NOTE — Telephone Encounter (Signed)
PT need a refill  metoprolol tartrate (LOPRESSOR) 25 MG tablet [[id number]]  [id number] [address], [address]  [place] [address]  Phone: [phone] Fax: [phone]

## 2020-09-07 NOTE — Addendum Note (Signed)
Addended by: Elliot Cousin on: 09/07/2020 09:24 AM   Modules accepted: Orders

## 2020-09-07 NOTE — Telephone Encounter (Signed)
Tried to call patient back in regards to little urinary output. No answer. No VM. If patient calls back please ask if urine is clear, most people are nml as long as good volume and 4 voids per day.

## 2020-09-07 NOTE — Telephone Encounter (Signed)
I saw the pt today.  She was unable to refill clozapine for 2 weeks and had psychotic decompensation.  The pharmacy was saying they wouldn't dispense the clozapine wihtout  the CBC results.  I don't know why they couldn't get it from [facility] like the other pharmacies have done.  In the interim since, I've had staff fax result to both [facility] and pharmacy.  I don't know if the [facility] update Traci told us about will solve this but if not please talk to the pharmacy and [facility] to figure out why this happened and how to prevent it in the future.

## 2020-09-07 NOTE — Patient Instructions (Signed)
Carla Thompson exercises for the neck

## 2020-09-11 ENCOUNTER — Other Ambulatory Visit: Payer: Self-pay

## 2020-09-11 ENCOUNTER — Encounter: Payer: Self-pay | Admitting: Emergency Medicine

## 2020-09-11 ENCOUNTER — Telehealth: Payer: Self-pay

## 2020-09-11 ENCOUNTER — Ambulatory Visit: Payer: Self-pay | Admitting: *Deleted

## 2020-09-11 DIAGNOSIS — K5900 Constipation, unspecified: Secondary | ICD-10-CM | POA: Insufficient documentation

## 2020-09-11 DIAGNOSIS — Z79899 Other long term (current) drug therapy: Secondary | ICD-10-CM | POA: Insufficient documentation

## 2020-09-11 DIAGNOSIS — E860 Dehydration: Secondary | ICD-10-CM | POA: Insufficient documentation

## 2020-09-11 DIAGNOSIS — N3000 Acute cystitis without hematuria: Secondary | ICD-10-CM | POA: Insufficient documentation

## 2020-09-11 DIAGNOSIS — R000 Tachycardia, unspecified: Secondary | ICD-10-CM | POA: Insufficient documentation

## 2020-09-11 DIAGNOSIS — I10 Essential (primary) hypertension: Secondary | ICD-10-CM | POA: Diagnosis not present

## 2020-09-11 LAB — COMPREHENSIVE METABOLIC PANEL
ALT: 14 U/L (ref 0–44)
AST: 15 U/L (ref 15–41)
Albumin: 4.3 g/dL (ref 3.5–5.0)
Alkaline Phosphatase: 95 U/L (ref 38–126)
Anion gap: 9 (ref 5–15)
BUN: 11 mg/dL (ref 6–20)
CO2: 28 mmol/L (ref 22–32)
Calcium: 9.5 mg/dL (ref 8.9–10.3)
Chloride: 102 mmol/L (ref 98–111)
Creatinine, Ser: 1 mg/dL (ref 0.44–1.00)
GFR, Estimated: >60 mL/min (ref >60)
Glucose, Bld: 112 mg/dL — ABNORMAL HIGH (ref 70–99)
Potassium: 3.8 mmol/L (ref 3.5–5.1)
Sodium: 139 mmol/L (ref 135–145)
Total Bilirubin: 0.5 mg/dL (ref 0.3–1.2)
Total Protein: 7.6 g/dL (ref 6.5–8.1)

## 2020-09-11 LAB — CBC WITH DIFFERENTIAL/PLATELET
Abs Immature Granulocytes: 0.06 10*3/uL (ref 0.00–0.07)
Basophils Absolute: 0 10*3/uL (ref 0.0–0.1)
Basophils Relative: 0 %
Eosinophils Absolute: 0.2 10*3/uL (ref 0.0–0.5)
Eosinophils Relative: 2 %
HCT: 40.4 % (ref 36.0–46.0)
Hemoglobin: 12.9 g/dL (ref 12.0–15.0)
Immature Granulocytes: 1 %
Lymphocytes Relative: 18 %
Lymphs Abs: 2.4 10*3/uL (ref 0.7–4.0)
MCH: 30.9 pg (ref 26.0–34.0)
MCHC: 31.9 g/dL (ref 30.0–36.0)
MCV: 96.7 fL (ref 80.0–100.0)
Monocytes Absolute: 0.7 10*3/uL (ref 0.1–1.0)
Monocytes Relative: 5 %
Neutro Abs: 9.6 10*3/uL — ABNORMAL HIGH (ref 1.7–7.7)
Neutrophils Relative %: 74 %
Platelets: 291 10*3/uL (ref 150–400)
RBC: 4.18 MIL/uL (ref 3.87–5.11)
RDW: 12.3 % (ref 11.5–15.5)
WBC: 12.9 10*3/uL — ABNORMAL HIGH (ref 4.0–10.5)
nRBC: 0 % (ref 0.0–0.2)

## 2020-09-11 NOTE — ED Triage Notes (Signed)
Pt presents to ED with complaint of constipation the past few days. Pt states she did have a small bowel movement today but it has been a few days in between bowel movements.  Hx of chronic constipation since 2000. Seen in this ED for the same about 10 days ago. Denies abd pain.

## 2020-09-11 NOTE — Telephone Encounter (Signed)
Patient states she is passing gas and having intense pain. She also wanted you to know that she did go to ED weeks ago and they took an x-ray. Do you want her to go back again.

## 2020-09-11 NOTE — Telephone Encounter (Signed)
Patient called. Patient verbalized understanding.

## 2020-09-11 NOTE — Telephone Encounter (Signed)
Copied from [place] [phone]. Topic: General - Other >> Sep 11, 2020  9:16 AM Alanda Slim E wrote: Reason for CRM: Pt would like to speak with nurse or provider about her constipation/ Pt states she still cant get rid of it and has been taking Polyephylene glycol since last week and hasnt had any relief yet and wanted to know how long until this medication would help or if she needs something else/ please advise asap  Pt also states she is having an issue with shaky hands

## 2020-09-12 ENCOUNTER — Emergency Department: Payer: 59

## 2020-09-12 ENCOUNTER — Emergency Department
Admission: EM | Admit: 2020-09-12 | Discharge: 2020-09-12 | Disposition: A | Discharge status: Home / Self Care | Payer: 59 | Attending: Emergency Medicine | Admitting: Emergency Medicine

## 2020-09-12 DIAGNOSIS — N3000 Acute cystitis without hematuria: Secondary | ICD-10-CM

## 2020-09-12 DIAGNOSIS — K5900 Constipation, unspecified: Secondary | ICD-10-CM

## 2020-09-12 DIAGNOSIS — E860 Dehydration: Secondary | ICD-10-CM

## 2020-09-12 LAB — URINALYSIS, COMPLETE (UACMP) WITH MICROSCOPIC
Bilirubin Urine: NEGATIVE
Glucose, UA: NEGATIVE mg/dL
Hgb urine dipstick: NEGATIVE
Ketones, ur: NEGATIVE mg/dL
Nitrite: NEGATIVE
Protein, ur: NEGATIVE mg/dL
Specific Gravity, Urine: 1.005 (ref 1.005–1.030)
pH: 6 (ref 5.0–8.0)

## 2020-09-12 LAB — LACTIC ACID, PLASMA: Lactic Acid, Venous: 1.3 mmol/L (ref 0.5–1.9)

## 2020-09-12 LAB — LITHIUM LEVEL: Lithium Lvl: 0.46 mmol/L — ABNORMAL LOW (ref 0.60–1.20)

## 2020-09-12 LAB — LIPASE, BLOOD: Lipase: 25 U/L (ref 11–51)

## 2020-09-12 MED ORDER — LINACLOTIDE 145 MCG PO CAPS
145.0000 ug | ORAL_CAPSULE | Freq: Every day | ORAL | Status: DC
  Administered 2020-09-12: 145 ug via ORAL
  Filled 2020-09-12: qty 1

## 2020-09-12 MED ORDER — METOPROLOL TARTRATE 25 MG PO TABS
12.5000 mg | ORAL_TABLET | Freq: Two times a day (BID) | ORAL | Status: DC
  Administered 2020-09-12: 12.5 mg via ORAL
  Filled 2020-09-12: qty 1

## 2020-09-12 MED ORDER — CLOZAPINE 100 MG PO TABS
100.0000 mg | ORAL_TABLET | Freq: Every day | ORAL | Status: DC
  Filled 2020-09-12: qty 1

## 2020-09-12 MED ORDER — SENNA 8.6 MG PO TABS
2.0000 | ORAL_TABLET | Freq: Once | ORAL | Status: AC
  Administered 2020-09-12: 17.2 mg via ORAL
  Filled 2020-09-12: qty 2

## 2020-09-12 MED ORDER — MINERAL OIL RE ENEM
1.0000 | ENEMA | Freq: Once | RECTAL | Status: AC
  Administered 2020-09-12: 1 via RECTAL

## 2020-09-12 MED ORDER — POLYETHYLENE GLYCOL 3350 17 G PO PACK
17.0000 g | PACK | Freq: Every day | ORAL | Status: DC
  Administered 2020-09-12: 17 g via ORAL
  Filled 2020-09-12: qty 1

## 2020-09-12 MED ORDER — CLONAZEPAM 0.5 MG PO TABS
0.5000 mg | ORAL_TABLET | Freq: Once | ORAL | Status: AC
  Administered 2020-09-12: 0.5 mg via ORAL
  Filled 2020-09-12: qty 1

## 2020-09-12 MED ORDER — LACTATED RINGERS IV BOLUS
1000.0000 mL | Freq: Once | INTRAVENOUS | Status: AC
  Administered 2020-09-12: 1000 mL via INTRAVENOUS

## 2020-09-12 MED ORDER — LACTATED RINGERS IV BOLUS
500.0000 mL | Freq: Once | INTRAVENOUS | Status: AC
  Administered 2020-09-12: 500 mL via INTRAVENOUS

## 2020-09-12 MED ORDER — SODIUM CHLORIDE 0.9 % IV SOLN
1.0000 g | Freq: Once | INTRAVENOUS | Status: AC
  Administered 2020-09-12: 1 g via INTRAVENOUS
  Filled 2020-09-12: qty 10

## 2020-09-12 MED ORDER — CEPHALEXIN 500 MG PO CAPS
500.0000 mg | ORAL_CAPSULE | Freq: Four times a day (QID) | ORAL | 0 refills | Status: AC
Start: 2020-09-12 — End: 2020-09-17

## 2020-09-12 MED ORDER — CLOZAPINE 100 MG PO TABS
100.0000 mg | ORAL_TABLET | Freq: Once | ORAL | Status: DC
  Filled 2020-09-12: qty 1

## 2020-09-12 MED ORDER — METOPROLOL TARTRATE 25 MG PO TABS: 25.0000 mg | ORAL_TABLET | Freq: Two times a day (BID) | ORAL | Status: DC

## 2020-09-12 MED ORDER — METOPROLOL TARTRATE 25 MG PO TABS: 12.5000 mg | ORAL_TABLET | Freq: Two times a day (BID) | ORAL | Status: DC

## 2020-09-12 MED ORDER — CLOZAPINE 100 MG PO TABS
400.0000 mg | ORAL_TABLET | Freq: Once | ORAL | Status: AC
  Administered 2020-09-12: 400 mg via ORAL
  Filled 2020-09-12: qty 4

## 2020-09-12 NOTE — ED Provider Notes (Signed)
[facility] Emergency Department Provider Note  ____________________________________________   First MD Initiated Contact with Patient 09/12/20 [phone]     (approximate)  I have reviewed the triage vital signs and the nursing notes.   HISTORY  Chief Complaint Constipation   HPI Carla Thompson is a 59 y.o. female with a past medical history of HTN, schizoaffective disorder, and chronic constipation who presents for assessment of some abdominal discomfort associate with constipation over the last 2 weeks.  Patient states she was referred to the ED by her PCP after her constipation failed to improve with prune juice and 1 cap of MiraLAX per day.  Patient states she has been having some bowel movements over the last couple of days but describes them as very hard and rounded.  She denies any nausea, vomiting, chest pain, cough, shortness of breath, myalgias, rash, headache, earache, sore throat, fevers, blood in her poop, blood in her urine, or other acute complaints.  She states she does not have any burning with urination although she sometimes feels she is little dehydrated has some difficulty initiating her stream.  Denies EtOH or illicit drug use.         Past Medical History:  Diagnosis Date  . Abnormal perimenopausal bleeding   . Bowel incontinence   . Chronic constipation   . Dermatitis   . High risk medication use   . Hyperglycemia   . Hypertriglyceridemia   . Iron deficiency anemia due to chronic blood loss    secondary to heavy flow  . Metallic taste   . Schizoaffective disorder, bipolar type [facility])    Dr. Tyrone Sage    Patient Active Problem List   Diagnosis Date Noted  . Hyperlipidemia 11/20/2017  . GERD without esophagitis 02/21/2017  . Tachycardia 02/20/2016  . RLS (restless legs syndrome) 12/21/2015  . IBS (irritable bowel syndrome) 12/21/2015  . Other long term (current) drug therapy 12/08/2015  . Chronic constipation 12/08/2015   . Insomnia, persistent 12/08/2015  . Dermatitis, eczematoid 12/08/2015  . Gravida 2 para 2 12/08/2015  . Hypertriglyceridemia 12/08/2015  . Metallic taste 28/31/5176  . 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 Schizoaffective disorder, bipolar type ([place]) 12/08/2015    Past Surgical History:  Procedure Laterality Date  . ANUS SURGERY  2000   [place]  . TUBAL LIGATION  12/02/1994    Prior to Admission medications   Medication Sig Start Date End Date Taking? Authorizing Provider  clonazePAM (KLONOPIN) 0.5 MG tablet TAKE 1 TABLET BY MOUTH EVERY MORNING, AND 1 AND 1/2 TABLETS AT BEDTIME 07/03/20  Yes Cottle, Billey Co., MD  cloZAPine (CLOZARIL) 100 MG tablet TAKE 1 TABLET BY MOUTH EVERY MORNING, AND 3 EVERY NIGHT AT BEDTIME Patient taking differently: Take 400 mg by mouth at bedtime.  05/31/20  Yes Cottle, Billey Co., MD  docusate sodium (STOOL SOFTENER) 100 MG capsule Take 100 mg by mouth daily.    Yes [provider]  LINZESS 145 MCG CAPS capsule Take 145 mcg by mouth daily. 05/06/20  Yes [provider]  lithium 300 MG tablet TAKE 2 TABLETS(600 MG) BY MOUTH AT BEDTIME 07/27/20  Yes Cottle, Billey Co., MD  metoprolol tartrate (LOPRESSOR) 25 MG tablet TAKE 1/2 TABLET(12.5 MG) BY MOUTH TWICE DAILY Patient taking differently: Take 12.5 mg by mouth in the morning and at bedtime. TAKE 1/2 TABLET(12.5 MG) BY MOUTH TWICE DAILY 08/08/20  Yes Sowles, Drue Stager, MD  polyethylene glycol powder (GLYCOLAX/MIRALAX) 17 GM/SCOOP powder Take 17 g  by mouth 2 (two) times daily as needed (constipation). 09/04/20  Yes Delsa Grana, PA-C  Vitamin D, Ergocalciferol, (DRISDOL) 1.25 MG (50000 UNIT) CAPS capsule TAKE ONE CAPSULE BY MOUTH WEEKLY 08/28/20  Yes Sowles, Drue Stager, MD  cephALEXin (KEFLEX) 500 MG capsule Take 1 capsule (500 mg total) by mouth 4 (four) times daily for 5 days. 09/12/20 09/17/20  Lucrezia Starch, MD    Allergies Levsin [hyoscyamine sulfate] and Escitalopram  Family History    Problem Relation Age of Onset  . Mental illness Sister        Schizophrenia and bipolar  . Cancer Maternal Grandmother        Colon  . Mental illness Paternal Grandmother        Schizophrenia  . Breast cancer Neg Hx     Social History Social History   Tobacco Use  . Smoking status: Never Smoker  . Smokeless tobacco: Never Used  Vaping Use  . Vaping Use: Never used  Substance Use Topics  . Alcohol use: No    Alcohol/week: 0.0 standard drinks  . Drug use: No    Review of Systems  Review of Systems  Constitutional: Negative for chills and fever.  HENT: Negative for sore throat.   Eyes: Negative for pain.  Respiratory: Negative for cough and stridor.   Cardiovascular: Negative for chest pain.  Gastrointestinal: Positive for abdominal pain and constipation. Negative for vomiting.  Genitourinary: Positive for frequency and urgency. Negative for dysuria, flank pain and hematuria.  Musculoskeletal: Negative for myalgias.  Skin: Negative for rash.  Neurological: Negative for seizures, loss of consciousness and headaches.  Psychiatric/Behavioral: Negative for suicidal ideas.  All other systems reviewed and are negative.     ____________________________________________   PHYSICAL EXAM:  VITAL SIGNS: ED Triage Vitals  Enc Vitals Group     BP 09/11/20 2000 117/68     Pulse Rate 09/11/20 2000 (!) 119     Resp 09/11/20 2000 18     Temp 09/11/20 2000 97.9 F (36.6 C)     Temp Source 09/11/20 2000 Oral     SpO2 09/11/20 2000 99 %     Weight 09/11/20 2001 130 lb (59 kg)     Height 09/11/20 2001 5\' 4"  (1.626 m)     Head Circumference --      Peak Flow --      Pain Score 09/11/20 2001 0     Pain Loc --      Pain Edu? --      Excl. in [place]? --    Vitals:   09/12/20 1024 09/12/20 1043  BP: 131/72 119/74  Pulse: 96 88  Resp: 19 18  Temp: 97.7 F (36.5 C)   SpO2: 100% 100%   Physical Exam Vitals and nursing note reviewed.  Constitutional:      General: She is not  in acute distress.    Appearance: She is well-developed.  HENT:     Head: Normocephalic and atraumatic.     Right Ear: External ear normal.     Left Ear: External ear normal.     Nose: Nose normal.     Mouth/Throat:     Mouth: Mucous membranes are dry.  Eyes:     Conjunctiva/sclera: Conjunctivae normal.  Cardiovascular:     Rate and Rhythm: Regular rhythm. Tachycardia present.     Heart sounds: No murmur heard.   Pulmonary:     Effort: Pulmonary effort is normal. No respiratory distress.     Breath sounds:  Normal breath sounds.  Abdominal:     Palpations: Abdomen is soft.     Tenderness: There is no abdominal tenderness.  Musculoskeletal:     Cervical back: Neck supple.     Right lower leg: No edema.     Left lower leg: No edema.  Skin:    General: Skin is warm and dry.     Capillary Refill: Capillary refill takes 2 to 3 seconds.  Neurological:     Mental Status: She is alert and oriented to person, place, and time.  Psychiatric:        Mood and Affect: Mood normal.      ____________________________________________   LABS (all labs ordered are listed, but only abnormal results are displayed)  Labs Reviewed  CBC WITH DIFFERENTIAL/PLATELET - Abnormal; Notable for the following components:      Result Value   WBC 12.9 (*)    Neutro Abs 9.6 (*)    All other components within normal limits  COMPREHENSIVE METABOLIC PANEL - Abnormal; Notable for the following components:   Glucose, Bld 112 (*)    All other components within normal limits  URINALYSIS, COMPLETE (UACMP) WITH MICROSCOPIC - Abnormal; Notable for the following components:   Color, Urine YELLOW (*)    APPearance CLOUDY (*)    Leukocytes,Ua SMALL (*)    Bacteria, UA MANY (*)    All other components within normal limits  LITHIUM LEVEL - Abnormal; Notable for the following components:   Lithium Lvl 0.46 (*)    All other components within normal limits  URINE CULTURE  LACTIC ACID, PLASMA  LACTIC ACID, PLASMA   LIPASE, BLOOD   ____________________________________________  EKG  Sinus tachycardia with a heart rate of 110, normal axis, prolonged QTc interval at 531, nonspecific ST change in lead III and no other clear evidence of acute ischemia. ____________________________________________  RADIOLOGY  ED MD interpretation: Large colonic stool burden without evidence of SBO or acute cardio pulmonary pathology.  Official radiology report(s): DG Abdomen Acute W/Chest  Result Date: 09/12/2020 CLINICAL DATA:  Constipation.  Tachycardia. EXAM: DG ABDOMEN ACUTE W/ 1V CHEST COMPARISON:  Abdominal radiographs 08/31/2020 FINDINGS: The patient is slightly rotated to the right. The cardiomediastinal silhouette is within normal limits. No confluent airspace opacity, edema, pleural effusion, or pneumothorax is identified. No intraperitoneal free air is identified. There is a moderate amount of stool throughout the colon, less than on the prior study. No dilated loops of bowel are seen to suggest obstruction. No acute osseous abnormality is identified. IMPRESSION: 1. Moderate colonic stool burden, less than on the prior study. No evidence of bowel obstruction. 2. No evidence of acute cardiopulmonary disease. Electronically Signed   By: Logan Bores M.D.   On: 09/12/2020 07:26    ____________________________________________   PROCEDURES  Procedure(s) performed (including Critical Care):  Procedures   ____________________________________________   INITIAL IMPRESSION / ASSESSMENT AND PLAN / ED COURSE        Patient presents with [place] to history exam complaining of some crampy abdominal pain associated with approximately 2 weeks of   Patient also endorses some urinary hesitancy. On arrival patient is tachycardic with a heart rate of 119 with otherwise stable vital signs on room air.  Differential includes but is not limited to constipation secondary to side effects from current antipsychotic medications,  dehydration, cystitis, pancreatitis, cholecystitis, diverticulitis, appendicitis, pyelonephritis.  Patient denies any chest pain given with a very reassuring EKG low suspicion for ACS at this time.  CBC remarkable for  mildly elevated white blood cell count at 12.9 although this is noted to be downtrending from that obtained 12 days ago that was 13.3.  CBC is unremarkable for any evidence of significant electrode or metabolic derangements and LFTs are normal.  Lactic acid is 1.3.  UA does show some evidence of infection with small leukocyte esterase, many bacteria, and slightly elevated WBC count of 6-10.  Urine culture sent.  Low suspicion for [place] given patient has no fever or CVA tenderness.  Patient has generally full abdomen on palpation but no focal tenderness in the right upper quadrant or right lower quadrant in a have a low suspicion for acute appendicitis or cholelithiasis at this time.  In addition there is no evidence of cholestasis or transaminitis on CMP.  UA is suggestive of cystitis.  Urine culture sent.  Patient given 1 dose of antibiotics in ED.  In addition although she is dehydrated and this is likely contributing to her constipation.  Patient was given mineral oil enema as well as a dose of laxative in the ED.  She was also given a dose of her home medications including metoprolol which she has missed last night.  On reassessment patient's heart rate had improved to 88 but she otherwise had normal vital signs.  She states she felt a little bit better after she had a bowel movement following the enema.  Given improvement in heart rate and patient's abdominal pain after bowel movement with otherwise reassuring exam I believe she is safe for discharge with plan for continued outpatient follow-up with her PCP.  Keflex written for concern for cystitis.  I discussed importance of close outpatient PCP follow-up with the patient and advised her on appropriate outpatient bowel regiment.  This was  also provided in writing.  Patient discharged stable condition.  Strict return cautions advised and discussed.  ____________________________________________   FINAL CLINICAL IMPRESSION(S) / ED DIAGNOSES  Final diagnoses:  Constipation, unspecified constipation type  Acute cystitis without hematuria  Dehydration    Medications  linaclotide (LINZESS) capsule 145 mcg (145 mcg Oral Given 09/12/20 0944)  polyethylene glycol (MIRALAX / GLYCOLAX) packet 17 g (17 g Oral Given 09/12/20 0944)  metoprolol tartrate (LOPRESSOR) tablet 12.5 mg (12.5 mg Oral Given 09/12/20 0953)  cloZAPine (CLOZARIL) tablet 400 mg (has no administration in time range)  lactated ringers bolus 1,000 mL (0 mLs Intravenous Stopped 09/12/20 0942)  mineral oil enema 1 enema (1 enema Rectal Given 09/12/20 0848)  senna (SENOKOT) tablet 17.2 mg (17.2 mg Oral Given 09/12/20 0944)  cefTRIAXone (ROCEPHIN) 1 g in sodium chloride 0.9 % 100 mL IVPB (0 g Intravenous Stopped 09/12/20 1043)  clonazePAM (KLONOPIN) tablet 0.5 mg (0.5 mg Oral Given 09/12/20 0953)  lactated ringers bolus 500 mL (500 mLs Intravenous New Bag/Given 09/12/20 1043)     ED Discharge Orders         Ordered    cephALEXin (KEFLEX) 500 MG capsule  4 times daily        09/12/20 0940           Note:  This document was prepared using Dragon voice recognition software and may include unintentional dictation errors.   Lucrezia Starch, MD 09/12/20 1059

## 2020-09-12 NOTE — Discharge Instructions (Addendum)
Mix the entire 238-gram bottle of MiraLAX with 64 ounces of a sports drink. Drink all of the mixture over the next few hours until gone. (Suggestion: An 8-ounce glass every 15-30 minutes equals 2-4 hours.) It is very important to drink plenty of water and other liquids in order to avoid dehydration and to flush the bowel. (Although alcohol is a liquid, it can make you dehydrated. You should NOT drink alcohol while doing the cleanout.) NOTE: Please stay home once you have started your cleanout. Also, the use of moist towelettes or wipes may help to minimize discomfort during the cleanout. A nonprescription 1% hydrocortisone cream may also be soothing when applied to the rectal area after each bowel movement. It is common during the cleanout to experience some nausea, bloating, and/or abdominal distention. If you chilled the mixture prior to drinking it, you could experience chills from consuming so much cold liquid in a short time period. If you develop nausea or vomiting, slow down the rate at which you drink the solution. Please attempt to drink all of the laxative solution even if it takes you longer. Once stooling slows down, you may resume eating solid food. 

## 2020-09-12 NOTE — ED Notes (Signed)
Pt transported to xray 

## 2020-09-12 NOTE — Progress Notes (Signed)
Pharmacy Note - Clozapine  59 yo female on clozapine PTA presents with constipation.   10/11 [place] [address]  Lab submitted to clozapine registry; pt eligible to receive clozapine. If pt stays, would need f/u ANC in one week per hospital policy  Rayna Sexton, PharmD, BCPS Clinical Pharmacist 09/12/2020 10:05 AM

## 2020-09-14 LAB — URINE CULTURE: Culture: >=100000 — AB

## 2020-09-20 ENCOUNTER — Telehealth: Payer: Self-pay | Admitting: Psychiatry

## 2020-09-20 NOTE — Telephone Encounter (Signed)
Husband, Carla Thompson called to report that Carla Thompson was in the hospital for other medical reasons last week.  She had all her labs done and he wanted to make sure we had them.  They are in epic in her chart.  Please review. He also reported that he may need to have home care come in a few days to help.  Do you have any thoughts on this or any recommendations on who can help him with this?  I did tell him that I would mail him the names of a few of the home care services in the area and other resources that I am aware of.  Since they don't live in [place], he may need to research services in their area.

## 2020-09-20 NOTE — Telephone Encounter (Signed)
Please review

## 2020-09-25 ENCOUNTER — Telehealth: Payer: Self-pay

## 2020-09-25 NOTE — Telephone Encounter (Signed)
Dr Ancil Boozer is completely booked therefore I scheduled her with Dr Roxan Hockey for tomorrow Copied from [place] [phone]. Topic: General - Inquiry >> Sep 25, 2020 11:08 AM Gillis Ends D wrote: Reason for CRM: Patient called with questions about Miralax, she went to the ER for constipation and they prescribed this but its not working and she has taken it for 2 times a day and still no results. She wants a call back from her doctor. She can be reached at [phone]

## 2020-09-26 NOTE — Telephone Encounter (Signed)
Pt calling back for advice. Pt finally had a bowl movement yesterday.  She states it was huge and very painful. Pt states she is drinking lots of water. Pt wants to know if is is possible there is a hole in her rectum? Pt states she takes Miralax 2 X a day. Pt states this doesn't work on regular basis.

## 2020-09-27 ENCOUNTER — Telehealth: Payer: Self-pay | Admitting: Psychiatry
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
## 2020-09-27 NOTE — Telephone Encounter (Signed)
Pt's husband, [name] called about her CBC results faxed to [facility] [address] so he can pick up the Klonopin.

## 2020-09-27 NOTE — Telephone Encounter (Signed)
Admin staff please call Loretha Brasil on this.  Pt has been having trouble gettting clozapine.  CBC should be faxed 10/28 AM.  I think it was already faxed but do it again.  Then call pharmacy and make sure they got it.  There has been a repeated problem with pt not being able to get clozapine on time which has led to psychotic symptoms

## 2020-09-27 NOTE — Telephone Encounter (Signed)
Please review

## 2020-09-28 ENCOUNTER — Encounter: Payer: Self-pay | Admitting: Internal Medicine

## 2020-09-28 ENCOUNTER — Telehealth: Payer: Self-pay | Admitting: Psychiatry

## 2020-09-28 ENCOUNTER — Other Ambulatory Visit: Payer: Self-pay

## 2020-09-28 ENCOUNTER — Ambulatory Visit (INDEPENDENT_AMBULATORY_CARE_PROVIDER_SITE_OTHER): Payer: 59 | Admitting: Internal Medicine

## 2020-09-28 ENCOUNTER — Ambulatory Visit: Payer: Self-pay

## 2020-09-28 VITALS — BP 100/60 | HR 91 | Temp 98.2°F | Resp 16 | Ht 64.0 in | Wt 130.0 lb

## 2020-09-28 DIAGNOSIS — F250 Schizoaffective disorder, bipolar type: Secondary | ICD-10-CM

## 2020-09-28 DIAGNOSIS — K5909 Other constipation: Secondary | ICD-10-CM

## 2020-09-28 DIAGNOSIS — Z09 Encounter for follow-up examination after completed treatment for conditions other than malignant neoplasm: Secondary | ICD-10-CM | POA: Diagnosis not present

## 2020-09-28 NOTE — Telephone Encounter (Signed)
Please call patient's husband Annie Main verify that he has been able to pick up the clozapine.  There have been repeated problems with availability due to problems between the lab and [place] in the pharmacy.

## 2020-09-28 NOTE — Telephone Encounter (Signed)
The 10//11 lab result has been faxed to her pharmacy and to [facility]. This lab will need to be put into Epic.

## 2020-09-28 NOTE — Patient Instructions (Signed)
Do feel it is very important to keep the planned follow-up with gastroenterology as discussed today

## 2020-09-28 NOTE — Telephone Encounter (Signed)
Patient called stating that she has had bad constipation.  She states that 2-3 weeks ago she had a difficult time passing her hard stool.  She states that she drinks at least 6 glasses of water per day and takes Miralax.  She states that in the past week she has only had small bm which was balls. She also states that she feels she is not urinating enough.  She says she was treated for a UTI and has completed medication. She states that she does not feel that she has a full bladder but that she has a hard time controlling her bladder.  She was up two times last night and had urinated before she could get to the bathroom. She wears depends. Per protocol I tried to make patient appointment.  She informed me that she was into day to see Dr Roxan Hockey.  She is seek advice only. I will rout note to office for evaluation.  She is aware that she may have to have a follow up appointment.  Reason for Disposition . Unable to have a bowel movement (BM) without laxative or enema . [1] Can't control passage of urine (i.e., urinary incontinence, wetting self) AND [2] present > 2 weeks  Answer Assessment - Initial Assessment Questions 1. STOOL PATTERN OR FREQUENCY: "How often do you pass bowel movements (BMs)?"  (Normal range: tid to q 3 days)  "When was the last BM passed?"       Went 2-3 weeks without having BM now just a few balls uesterday 2. STRAINING: "Do you have to strain to have a BM?"      yes 3. RECTAL PAIN: "Does your rectum hurt when the stool comes out?" If Yes, ask: "Do you have hemorrhoids? How bad is the pain?"  (Scale 1-10; or mild, moderate, severe)     None now 4. STOOL COMPOSITION: "Are the stools hard?"      Hard balls 5. BLOOD ON STOOLS: "Has there been any blood on the toilet tissue or on the surface of the BM?" If Yes, ask: "When was the last time?"      no 6. CHRONIC CONSTIPATION: "Is this a new problem for you?"  If no, ask: "How long have you had this problem?" (days, weeks, months)       Chronic from medication 7. CHANGES IN DIET OR HYDRATION: "Have there been any recent changes in your diet?" "How much fluids are you drinking consuming on a daily basis?"  "How much have you had to drink today?"     water 6 cups through out the day 8. MEDICATIONS: "Have you been taking any new medications?" "Are you taking any narcotic pain medications?" (e.g., Vicoden, Percocet, morphine, dilaudid)     No just psyciatric 9. LAXATIVES: "Have you been using any stool softeners, laxatives, or enemas?"  If yes, ask "What, how often, and when was the last time?" myrlax  2 x day 10.ACTIVITY:  "How much walking do you do every day? on a daily basis?"  "Has your activity level decreased in the past week?"        Does not walk a lot 11. CAUSE: "What do you think is causing the constipation?"        medications 12. OTHER SYMPTOMS: "Do you have any other symptoms?" (e.g., abdominal pain, bloating, fever, vomiting)       Bloated for years has irritable bowel 13. MEDICAL HISTORY: "Do you have a history of hemorrhoids, rectal fissures, or rectal surgery or rectal  abscess?"         spinctor repair surgery 14. PREGNANCY: "Is there any chance you are pregnant?" "When was your last menstrual period?"       No  Answer Assessment - Initial Assessment Questions 1. SYMPTOM: "What's the main symptom you're concerned about?" (e.g., frequency, incontinence)     Feels she is not urinating enough 2. ONSET: "When did the  hesitation  start?"    Started with constipation 3. PAIN: "Is there any pain?" If Yes, ask: "How bad is it?" (Scale: 1-10; mild, moderate, severe)    no 4. CAUSE: "What do you think is causing the symptoms?"    unsure 5. OTHER SYMPTOMS: "Do you have any other symptoms?" (e.g., fever, flank pain, blood in urine, pain with urination)     none 6. PREGNANCY: "Is there any chance you are pregnant?" "When was your last menstrual period?"     N/A  Protocols used: [place]

## 2020-09-28 NOTE — Telephone Encounter (Signed)
Talked with Annie Main. He will let us know if he has any problems.

## 2020-09-28 NOTE — Telephone Encounter (Signed)
Addendum to previous message on 09/28/20. Left a message for husband [name] to call back to inform if he has picked up her Clozapine.

## 2020-09-29 ENCOUNTER — Telehealth: Payer: Self-pay

## 2020-09-29 ENCOUNTER — Ambulatory Visit: Payer: Self-pay
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
## 2020-09-29 NOTE — Telephone Encounter (Signed)
Her main concern when seen was chronic constipation. No dysuria or concerns for UTI noted during the visit.  Please let patient know that if symptoms continue, if she is having increased frequency, or any dysuria or foul urine odor, needs to be evaluated and have urinalysis and culture as well. If any fevers or flank pain, needs seen more urgently in an urgent care or ER setting. Thanks
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
## 2020-10-02 ENCOUNTER — Telehealth: Payer: Self-pay | Admitting: Psychiatry

## 2020-10-02 ENCOUNTER — Telehealth: Payer: Self-pay | Admitting: Family Medicine

## 2020-10-02 NOTE — Telephone Encounter (Signed)
Carla Thompson called today to report that her feet are moving and shaking as are her legs.  This makes is very hard to walk.  Her stomach is shaky too.  Mainly this has been going on for the last couple of days.  Her hands are also shaking terrible and it scares her.  Please call to discuss how to get rid of this.
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
## 2020-10-02 NOTE — Telephone Encounter (Signed)
Thank you for your reply, I have also seen her with and without her husband and also think secondary to her psychosis, but wanted your opinion since you are her psychiatrist.  Carla Thompson

## 2020-10-02 NOTE — Telephone Encounter (Signed)
Called patient. She would like to see Dr. Ancil Boozer for urinary concerns if she has availability, if not she will see Dr. Blase Mess. Call was transferred to the front desk for scheduling.

## 2020-10-02 NOTE — Telephone Encounter (Signed)
Pt is scheduled with Dr Roxan Hockey for 10/03/20

## 2020-10-02 NOTE — Telephone Encounter (Signed)
Please review

## 2020-10-03 ENCOUNTER — Ambulatory Visit: Payer: 59 | Admitting: Internal Medicine

## 2020-10-03 NOTE — Telephone Encounter (Signed)
Increase clonazepam to 1 and 1/2 tablets of the 0.5 mg tablets twice daily

## 2020-10-03 NOTE — Telephone Encounter (Signed)
Pt is scheduled and will call her husband to see if he can bring her

## 2020-10-04 ENCOUNTER — Other Ambulatory Visit: Payer: Self-pay

## 2020-10-04 ENCOUNTER — Ambulatory Visit (INDEPENDENT_AMBULATORY_CARE_PROVIDER_SITE_OTHER): Payer: 59 | Admitting: Family Medicine

## 2020-10-04 ENCOUNTER — Encounter: Payer: Self-pay | Admitting: Family Medicine

## 2020-10-04 VITALS — BP 108/76 | HR 90 | Temp 98.1°F | Resp 16 | Ht 64.0 in | Wt 124.4 lb

## 2020-10-04 DIAGNOSIS — K5909 Other constipation: Secondary | ICD-10-CM | POA: Diagnosis not present

## 2020-10-04 DIAGNOSIS — F250 Schizoaffective disorder, bipolar type: Secondary | ICD-10-CM

## 2020-10-04 DIAGNOSIS — F4001 Agoraphobia with panic disorder: Secondary | ICD-10-CM

## 2020-10-04 DIAGNOSIS — R151 Fecal smearing: Secondary | ICD-10-CM | POA: Diagnosis not present

## 2020-10-04 MED ORDER — CLONAZEPAM 0.5 MG PO TABS: ORAL_TABLET | ORAL | 5 refills | Status: DC

## 2020-10-04 NOTE — Telephone Encounter (Signed)
Rtc to patient and tried to give instructions on increase of her Clonazepam but she said she was confused. I contacted [name] and gave him instructions on adding 1/2 tablet extra in the am. He verbalized understanding and mentioned a refill was due in the next couple of days. Asked for an updated Rx to be sent to [facility] for Dr. Clovis Pu to review

## 2020-10-04 NOTE — Telephone Encounter (Signed)
Thanks sent RX

## 2020-10-04 NOTE — Progress Notes (Signed)
Name: Carla Thompson   MRN: [id number]    DOB: [birth year]   Date:10/04/2020       Progress Note  Subjective  Chief Complaint  Referral for home health   HPI  Schizophrenia: patient is usually well controlled with medication. She came in with her husband today . She had difficulty getting Clozaril back in August - because of pharmacy not getting results of CBC with difi and medication not being dispensed. Patient has not been doing well since. She has been calling our office multiple times, seems to be getting more paranoid, complaining of worsening of constipation and diarrhea. Obsessing about what she can and cannot do. Husband states it happens every so often. She states she needs help cooking because her food it not good, also wants someone to clean her house. Explained that she does not qualify for home health assistance. She states she would like to stop taking Clozaril since she has more energy when off medication, I explained to her that keeps other symptoms under control.  She does not want to take Miralax, but when off medication she can go days without a bowel movement and has been soiling more, advised to try half cap of miralax bid and keep follow up with gastroenterologist next week.    Patient Active Problem List   Diagnosis Date Noted  . Hyperlipidemia 11/20/2017  . GERD without esophagitis 02/21/2017  . Tachycardia 02/20/2016  . RLS (restless legs syndrome) 12/21/2015  . IBS (irritable bowel syndrome) 12/21/2015  . Other long term (current) drug therapy 12/08/2015  . Chronic constipation 12/08/2015  . Insomnia, persistent 12/08/2015  . Dermatitis, eczematoid 12/08/2015  . Gravida 2 para 2 12/08/2015  . Hypertriglyceridemia 12/08/2015  . Metallic taste 44/31/5400  . 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 Schizoaffective disorder, bipolar type ([place]) 12/08/2015    Past Surgical History:  Procedure Laterality Date  . ANUS SURGERY  2000   [place]  . TUBAL LIGATION   12/02/1994    Family History  Problem Relation Age of Onset  . Mental illness Sister        Schizophrenia and bipolar  . Cancer Maternal Grandmother        Colon  . Mental illness Paternal Grandmother        Schizophrenia  . Breast cancer Neg Hx     Social History   Tobacco Use  . Smoking status: Never Smoker  . Smokeless tobacco: Never Used  Substance Use Topics  . Alcohol use: No    Alcohol/week: 0.0 standard drinks     Current Outpatient Medications:  .  clonazePAM (KLONOPIN) 0.5 MG tablet, TAKE 1 TABLET BY MOUTH EVERY MORNING, AND 1 AND 1/2 TABLETS AT BEDTIME, Disp: 75 tablet, Rfl: 5 .  cloZAPine (CLOZARIL) 100 MG tablet, TAKE 1 TABLET BY MOUTH EVERY MORNING, AND 3 EVERY NIGHT AT BEDTIME (Patient taking differently: Take 400 mg by mouth at bedtime. ), Disp: 120 tablet, Rfl: 6 .  docusate sodium (STOOL SOFTENER) 100 MG capsule, Take 100 mg by mouth daily. , Disp: , Rfl:  .  LINZESS 145 MCG CAPS capsule, Take 145 mcg by mouth daily., Disp: , Rfl:  .  lithium 300 MG tablet, TAKE 2 TABLETS(600 MG) BY MOUTH AT BEDTIME, Disp: 180 tablet, Rfl: 1 .  metoprolol tartrate (LOPRESSOR) 25 MG tablet, TAKE 1/2 TABLET(12.5 MG) BY MOUTH TWICE DAILY (Patient taking differently: Take 12.5 mg by mouth in the morning and at bedtime. TAKE 1/2 TABLET(12.5 MG) BY MOUTH TWICE DAILY), Disp:  90 tablet, Rfl: 1 .  polyethylene glycol powder (GLYCOLAX/MIRALAX) 17 GM/SCOOP powder, Take 17 g by mouth 2 (two) times daily as needed (constipation)., Disp: 578 g, Rfl: 1 .  Vitamin D, Ergocalciferol, (DRISDOL) 1.25 MG (50000 UNIT) CAPS capsule, TAKE ONE CAPSULE BY MOUTH WEEKLY, Disp: 12 capsule, Rfl: 0  Allergies  Allergen Reactions  . Levsin [Hyoscyamine Sulfate] Hives  . Escitalopram Hives    I personally reviewed active problem list, medication list, allergies, family history, social history, health maintenance with the patient/caregiver today.   ROS  Constitutional: Negative for fever or weight  change.  Respiratory: Negative for cough and shortness of breath.   Cardiovascular: Negative for chest pain or palpitations.  Gastrointestinal: Positive  for abdominal pain, increase in constipation .  Musculoskeletal: Negative for gait problem or joint swelling.  Skin: Negative for rash.  Neurological: Negative for dizziness or headache.  No other specific complaints in a complete review of systems (except as listed in HPI above).  Objective  Vitals:   10/04/20 1201  BP: 108/76  Pulse: 90  Resp: 16  Temp: 98.1 F (36.7 C)  TempSrc: Oral  SpO2: 99%  Weight: 124 lb 6.4 oz (56.4 kg)  Height: 5\' 4"  (1.626 m)    Body mass index is 21.35 kg/m.  Physical Exam  Constitutional: Patient appears well-developed and well-nourished. No distress.  HEENT: head atraumatic, normocephalic, pupils equal and reactive to light, neck supple, throat within normal limits Cardiovascular: Normal rate, regular rhythm and normal heart sounds.  No murmur heard. No BLE edema. Pulmonary/Chest: Effort normal and breath sounds normal. No respiratory distress. Abdominal: Soft.  There is mild abdominal  Tenderness/discomfort, no guarding or rebound, normal bowel sounds . Psychiatric: Patient looking down today, repeating same complaints  PHQ2/9: Depression screen [facility] 2/9 10/04/2020 09/28/2020 09/04/2020 07/11/2020 12/24/2019  Decreased Interest 3 3 2  0 0  Down, Depressed, Hopeless 3 3 1  0 0  PHQ - 2 Score 6 6 3  0 0  Altered sleeping 3 - - 0 1  Tired, decreased energy 3 - - 1 2  Change in appetite 3 - - 0 0  Feeling bad or failure about yourself  2 - - 0 0  Trouble concentrating 3 - - 0 1  Moving slowly or fidgety/restless 2 - - 1 1  Suicidal thoughts 0 - - 0 0  PHQ-9 Score 22 - - 2 5  Difficult doing work/chores Very difficult - - Somewhat difficult Not difficult at all  Some recent data might be hidden    phq 9 is positive   Fall Risk: Fall Risk  10/04/2020 09/28/2020 09/04/2020 07/11/2020 12/24/2019   Falls in the past year? 1 1 1 1  0  Comment - - - - -  Number falls in past yr: 1 1 1  0 0  Comment - - - - -  Injury with Fall? 0 0 0 0 0  Comment - - - - -  Risk for fall due to : History of fall(s) - - - -  Follow up - - Falls evaluation completed - -     Functional Status Survey: Is the patient deaf or have difficulty hearing?: No Does the patient have difficulty seeing, even when wearing glasses/contacts?: No Does the patient have difficulty concentrating, remembering, or making decisions?: Yes Does the patient have difficulty walking or climbing stairs?: Yes Does the patient have difficulty dressing or bathing?: No Does the patient have difficulty doing errands alone such as visiting a doctor's office  or shopping?: Yes    Assessment & Plan  1. Schizoaffective disorder, bipolar type ([place])  Keep follow up with Dr. Clovis Pu  2. Fecal soiling  Keep follow up with GI  3. Chronic constipation  Take half dose miralax bid

## 2020-10-16 ENCOUNTER — Telehealth: Payer: Self-pay | Admitting: Family Medicine

## 2020-10-16 NOTE — Telephone Encounter (Signed)
Patient is calling to let Dr. Ancil Boozer know that she is taking Miralax it is giving her diarrhea. Patient states sometimes she can not make it to the bathroom. Patient does not feel that this normal.  Patient states she is concerned about not drinking enough. Patient states that until she hears back from Dr. Ancil Boozer that she will hold off from taking the miralax Please advise Cb- [phone]

## 2020-10-17 ENCOUNTER — Other Ambulatory Visit
Admission: RE | Admit: 2020-10-17 | Discharge: 2020-10-17 | Disposition: A | Discharge status: Home / Self Care | Payer: 59 | Attending: Psychiatry | Admitting: Psychiatry

## 2020-10-17 ENCOUNTER — Other Ambulatory Visit: Payer: Self-pay

## 2020-10-17 DIAGNOSIS — F250 Schizoaffective disorder, bipolar type: Secondary | ICD-10-CM | POA: Insufficient documentation

## 2020-10-17 LAB — CBC WITH DIFFERENTIAL/PLATELET
Abs Immature Granulocytes: 0.02 10*3/uL (ref 0.00–0.07)
Basophils Absolute: 0.1 10*3/uL (ref 0.0–0.1)
Basophils Relative: 1 %
Eosinophils Absolute: 0.2 10*3/uL (ref 0.0–0.5)
Eosinophils Relative: 2 %
HCT: 38.8 % (ref 36.0–46.0)
Hemoglobin: 12.4 g/dL (ref 12.0–15.0)
Immature Granulocytes: 0 %
Lymphocytes Relative: 14 %
Lymphs Abs: 1.5 10*3/uL (ref 0.7–4.0)
MCH: 30.8 pg (ref 26.0–34.0)
MCHC: 32 g/dL (ref 30.0–36.0)
MCV: 96.3 fL (ref 80.0–100.0)
Monocytes Absolute: 0.6 10*3/uL (ref 0.1–1.0)
Monocytes Relative: 5 %
Neutro Abs: 8.2 10*3/uL — ABNORMAL HIGH (ref 1.7–7.7)
Neutrophils Relative %: 78 %
Platelets: 265 10*3/uL (ref 150–400)
RBC: 4.03 MIL/uL (ref 3.87–5.11)
RDW: 12 % (ref 11.5–15.5)
WBC: 10.5 10*3/uL (ref 4.0–10.5)
nRBC: 0 % (ref 0.0–0.2)

## 2020-10-18 ENCOUNTER — Telehealth: Payer: Self-pay

## 2020-10-18 ENCOUNTER — Ambulatory Visit: Payer: Self-pay | Admitting: *Deleted

## 2020-10-18 NOTE — Telephone Encounter (Signed)
Copied from [place] [id number]. Topic: General - Other >> Oct 18, 2020 10:08 AM Keene Breath wrote: Reason for CRM: Patient called to ask the nurse or doctor to call her regarding her medication, Murelax.  She stated that she is still having issues and needs some guidance on how to take the medication.  Please call to discuss at [phone]

## 2020-10-18 NOTE — Telephone Encounter (Signed)
Patient has questions about miralax , when to take and how much to take. Patient reports she is not better with treated constipation and has "leakage" and incontinence that requires her to rush to bathroom upon awakening in the mornings. Patient had multiple questions and attempted to help patient understand order to take 17g twice a day as needed for constipation. Patient still confused with dosage even after recommending to take 1 heaping tablespoon in [place] of water. Patient reports LBM this am and she was incontinent of some formed stool and some "not formed" stool. Reviewed if stool is not formed then only take miralax one time a day.Patient was still having difficulty understanding how to take miralax. Informed patient she could have he husband call back for instructions to assist her in managing her miralax medication. Patient agreeable to have husband call back. Care advise given. Patient verbalized understanding of care advise and to call back if symptoms worsen. Please advise for medication management.   Reason for Disposition . Caller has medicine question only, adult not sick, AND triager answers question  Answer Assessment - Initial Assessment Questions 1. NAME of MEDICATION: "What medicine are you calling about?"     miralax  2. QUESTION: "What is your question?" (e.g., medication refill, side effect)     How much do I take?  3. PRESCRIBING HCP: "Who prescribed it?" Reason: if prescribed by specialist, call should be referred to that group.     Dr. Ancil Boozer  4. SYMPTOMS: "Do you have any symptoms?"     Constipation and leaking noted upon awakening 5. SEVERITY: If symptoms are present, ask "Are they mild, moderate or severe?"     Moderate  6. PREGNANCY:  "Is there any chance that you are pregnant?" "When was your last menstrual period?"     na  Protocols used: MEDICATION QUESTION CALL-A-AH

## 2020-10-19 NOTE — Telephone Encounter (Signed)
Clozapine registry just changed their system and this patient has had trouble getting her med on time.  Please call [facility] and see if they have been able to pick up her clozapine in the last couple of days.

## 2020-10-20 ENCOUNTER — Other Ambulatory Visit: Payer: Self-pay

## 2020-10-20 DIAGNOSIS — R151 Fecal smearing: Secondary | ICD-10-CM

## 2020-10-24 ENCOUNTER — Other Ambulatory Visit: Payer: Self-pay | Admitting: Psychiatry

## 2020-10-24 ENCOUNTER — Telehealth: Payer: Self-pay | Admitting: Psychiatry

## 2020-10-24 DIAGNOSIS — F250 Schizoaffective disorder, bipolar type: Secondary | ICD-10-CM

## 2020-10-24 MED ORDER — CLOZAPINE 100 MG PO TABS: ORAL_TABLET | ORAL | 6 refills | Status: DC

## 2020-10-24 NOTE — Telephone Encounter (Signed)
Clozapine registry just changed their system and this patient has had trouble getting her med on time.  Please call [facility] and see if they have been able to pick up her clozapine in the last couple of days.

## 2020-10-24 NOTE — Telephone Encounter (Signed)
Carla Thompson called for a refill on her Clozipine 100 mg. Call to [facility] [phone], phone # is [phone]. Next appt is 11/09/20.

## 2020-10-30 ENCOUNTER — Telehealth: Payer: Self-pay | Admitting: Psychiatry

## 2020-10-30 ENCOUNTER — Other Ambulatory Visit: Payer: Self-pay

## 2020-10-30 DIAGNOSIS — F250 Schizoaffective disorder, bipolar type: Secondary | ICD-10-CM

## 2020-10-30 MED ORDER — CLOZAPINE 100 MG PO TABS: ORAL_TABLET | ORAL | 6 refills | Status: DC

## 2020-10-30 NOTE — Telephone Encounter (Signed)
This has been taken care of earlier today. Will make note.

## 2020-10-30 NOTE — Telephone Encounter (Signed)
[name] called to report that the pharmacy will still not release Jeymi's clozapine.  They say they don't have the labs.  They told us Wednesday they had what they needed and were going to fill the medication for Mariska, but they didn't.  This pharmacy must not know how the clozapine process works because this happens every month.  [name] said he can't find a pharmacy in [place] that will dispense this medication or he would change pharmacies.  Anyway, the pharmacy needs to be called to find out what is needed to get the medication released.  [facility] on [address]

## 2020-10-31 NOTE — Telephone Encounter (Signed)
[facility] on [facility], they said they had not received any labs on patient. According to our office staff they faxed it last week before the holiday and spoke to someone that received it so patient's medication could be dispensed. Pharmacy indicated they did not see any record of this and to refax. We did refax on 10/30/2020. Discussed with her why this seems to be an issue each month, she reports all they need is the labs sent.  I asked if they were logged into the [place] Clozapine REMS and she indicated since the new update on 10/16/2020 they (pharmacy) have been unable to log in due to system wide issue. Informed her we are able to log in and update information on our end. If they are able to log in they can see patient's results each month. Asked who was the pharmacist in charge of their pharmacy and she gave the name [place].    Just an FYI for everyone to know Patient has to use [facility] on [address] for ONLY CLOZAPINE.   All other medications go to her normal [facility] in [place].   I included office staff because it's always an issue each month and I'm sure everyone receives a call from [place].

## 2020-11-09 ENCOUNTER — Encounter: Payer: Self-pay | Admitting: Psychiatry

## 2020-11-09 ENCOUNTER — Other Ambulatory Visit: Payer: Self-pay

## 2020-11-09 ENCOUNTER — Ambulatory Visit (INDEPENDENT_AMBULATORY_CARE_PROVIDER_SITE_OTHER): Payer: 59 | Admitting: Psychiatry

## 2020-11-09 DIAGNOSIS — K5909 Other constipation: Secondary | ICD-10-CM | POA: Diagnosis not present

## 2020-11-09 DIAGNOSIS — Z79899 Other long term (current) drug therapy: Secondary | ICD-10-CM | POA: Diagnosis not present

## 2020-11-09 DIAGNOSIS — F4001 Agoraphobia with panic disorder: Secondary | ICD-10-CM

## 2020-11-09 DIAGNOSIS — F250 Schizoaffective disorder, bipolar type: Secondary | ICD-10-CM

## 2020-11-09 NOTE — Progress Notes (Signed)
Carla Thompson [id number] 1961/05/05 59 y.o.  Subjective:   Patient ID:  Carla Thompson is a 59 y.o. (DOB 02/22/1961) female.  Chief Complaint:  Chief Complaint  Patient presents with  . Follow-up  . Medication Problem    Clozapine constipation    HPI Carla Thompson presents to the office today for follow-up of schizoaffective disorder.  visit October, 2020.  No med changes.  She continued on clozapine 400, lithium 600, and clonazepam 0.5 twice daily as needed.   03/09/20 appt noted: StepF had been sick with Covid and other problems.  Died.  Other family members with Covid problems too and died. [name] worked from home Nov 14, 2023 to April  Good overall without complaints.  Stress dealing with Covid.  Trying to stay active including cooking.  No major episodes of confusion nor voices.    No fear.  Does emails on the computer.  Not much anxiety lately.  Sleep same 9-9.  No fear episodes.  No manic nor depressive episodes.  No significant panic attacks recently.  She is not avoiding things out of anxiety generally.  She's still active at church.   Large infrequent BM clog toilet and intermittently not well managed.  Plan: Still problems with large infrequent q 4-7 days, large BM.  Often clogs the toilet.  Not well managed. Increase Linzess trial to 290 mg daily to see if it's better managed.  09/07/20 appt with following noted: She has notes. Balance problems, movements in feet legs, stomach.  Marching movements in feet.  Scares her. 1 fall on stairs going up. Wonders about reducing clozapine to 3 instead of 4 bc of tiredness. B researched lithium and said lithium and clozapine are "bad medicines with a lot of SE".   she's worried about SE. Disc ran out of clozapine for 2 weeks and decompensated.  back on it for almost a month.  Pharmacy was saying they couldn't dispense clozapine without labs and they didn't have the labs.  H says she's not back to normal yet bc can't focus and make  simple decisions.  For example couldn't decide whether to drink water out of the faucet or out of Brita water filter.  Lost 10# and not eating well.  Poor appetite.   Severely constipated chronically.  Using miralax.  Plan: continue clozapine  11/09/20 appt with following noted:  Seen with Loretha Brasil [place] problems with chronic constipation and using Miralax twice daily.  It is helpful but varies.  Doesn't like clozapine for this reason.  Usual trouble is leakage from the stool.  Disc sphincter exercises.   Lost weight. Some depression over the TD including feet moving up and down.  Someitmes stomach moves.  Occ tremor but not lately.  Trouble with sleep, initial and middle. Some of the problems with getting clozapine may have been related to not getting th labs to the correct [facility] in [place].  Doesn't like prunes or prune juice..  Not attending church due to having to wear depends and "tardive dyskinesia".    Oldest daughter married March 2020  On clozapine for many years with benefit.  Several psych hospitalizations.  She is failed multiple other psychiatric medications which is why she is on clozapine.  She is also failed or relapsed with dosage reduction of clozapine or if she missed clozapine.  She is consistent with her dosage now.  Past Psychiatric Medication Trials: Citrucil, Miralax, stool softener, Mg tablets  Sister has schizophrenia.  Review of Systems:  Review of  Systems  Gastrointestinal: Positive for abdominal distention, constipation and diarrhea. Negative for abdominal pain.  Neurological: Negative for tremors.  Psychiatric/Behavioral: Positive for confusion, decreased concentration, dysphoric mood and sleep disturbance. Negative for agitation, behavioral problems, hallucinations, self-injury and suicidal ideas. The patient is not nervous/anxious and is not hyperactive.   Constipation comes and goes  Medications: I have reviewed the patient's current medications.  Current  Outpatient Medications  Medication Sig Dispense Refill  . clonazePAM (KLONOPIN) 0.5 MG tablet TAKE 1 AND 1/2 TABLETS  BY MOUTH EVERY MORNING, AND 1 AND 1/2 TABLETS AT BEDTIME 90 tablet 5  . cloZAPine (CLOZARIL) 100 MG tablet TAKE 1 TABLET BY MOUTH EVERY MORNING, AND 3 EVERY NIGHT AT BEDTIME 120 tablet 6  . docusate sodium (COLACE) 100 MG capsule Take 100 mg by mouth daily.    Marland Kitchen LINZESS 145 MCG CAPS capsule Take 145 mcg by mouth daily.    Marland Kitchen lithium 300 MG tablet TAKE 2 TABLETS(600 MG) BY MOUTH AT BEDTIME 180 tablet 1  . metoprolol tartrate (LOPRESSOR) 25 MG tablet TAKE 1/2 TABLET(12.5 MG) BY MOUTH TWICE DAILY (Patient taking differently: Take 12.5 mg by mouth in the morning and at bedtime. TAKE 1/2 TABLET(12.5 MG) BY MOUTH TWICE DAILY) 90 tablet 1  . polyethylene glycol powder (GLYCOLAX/MIRALAX) 17 GM/SCOOP powder Take 17 g by mouth 2 (two) times daily as needed (constipation). 578 g 1  . Vitamin D, Ergocalciferol, (DRISDOL) 1.25 MG (50000 UNIT) CAPS capsule TAKE ONE CAPSULE BY MOUTH WEEKLY 12 capsule 0   No current facility-administered medications for this visit.    Medication Side Effects: Other: alternating contstipation and diarrhea but has IBS  Allergies:  Allergies  Allergen Reactions  . Levsin [Hyoscyamine Sulfate] Hives  . Escitalopram Hives    Past Medical History:  Diagnosis Date  . Abnormal perimenopausal bleeding   . Bowel incontinence   . Chronic constipation   . Dermatitis   . High risk medication use   . Hyperglycemia   . Hypertriglyceridemia   . Iron deficiency anemia due to chronic blood loss    secondary to heavy flow  . Metallic taste   . Schizoaffective disorder, bipolar type ([place])    Dr. Tyrone Sage    Family History  Problem Relation Age of Onset  . Mental illness Sister        Schizophrenia and bipolar  . Cancer Maternal Grandmother        Colon  . Mental illness Paternal Grandmother        Schizophrenia  . Breast cancer Neg Hx     Social  History   Socioeconomic History  . Marital status: Married    Spouse name: [name]  . Number of children: 2  . Years of education: Not on file  . Highest education level: Bachelor's degree (e.g., BA, AB, BS)  Occupational History  . Not on file  Tobacco Use  . Smoking status: Never Smoker  . Smokeless tobacco: Never Used  Vaping Use  . Vaping Use: Never used  Substance and Sexual Activity  . Alcohol use: No    Alcohol/week: 0.0 standard drinks  . Drug use: No  . Sexual activity: Not Currently    Partners: Male  Other Topics Concern  . Not on file  Social History Narrative  . Not on file   Social Determinants of Health   Financial Resource Strain: Low Risk   . Difficulty of Paying Living Expenses: Not hard at all  Food Insecurity: No Food Insecurity  . Worried  About Running Out of Food in the Last Year: Never true  . Ran Out of Food in the Last Year: Never true  Transportation Needs: No Transportation Needs  . Lack of Transportation (Medical): No  . Lack of Transportation (Non-Medical): No  Physical Activity: Insufficiently Active  . Days of Exercise per Week: 7 days  . Minutes of Exercise per Session: 10 min  Stress: No Stress Concern Present  . Feeling of Stress : Not at all  Social Connections: Socially Integrated  . Frequency of Communication with Friends and Family: More than three times a week  . Frequency of Social Gatherings with Friends and Family: More than three times a week  . Attends Religious Services: More than 4 times per year  . Active Member of Clubs or Organizations: Yes  . Attends Archivist Meetings: More than 4 times per year  . Marital Status: Married  Human resources officer Violence: Not At Risk  . Fear of Current or Ex-Partner: No  . Emotionally Abused: No  . Physically Abused: No  . Sexually Abused: No    Past Medical History, Surgical history, Social history, and Family history were reviewed and updated as appropriate.   Please see  review of systems for further details on the patient's review from today.   Objective:   Physical Exam:  LMP 12/16/2016 (Approximate)   Physical Exam Constitutional:      General: She is not in acute distress.    Appearance: She is well-developed.  Musculoskeletal:        General: No deformity.  Neurological:     Mental Status: She is alert and oriented to person, place, and time.     Cranial Nerves: No dysarthria.     Coordination: Coordination normal.  Psychiatric:        Attention and Perception: Perception normal. She is inattentive. She does not perceive auditory or visual hallucinations.        Mood and Affect: Mood is anxious. Mood is not depressed. Affect is not labile, blunt, angry, tearful or inappropriate.        Speech: Speech is tangential. Speech is not rapid and pressured or slurred.        Behavior: Behavior normal. Behavior is cooperative.        Thought Content: Thought content is paranoid. Thought content is not delusional. Thought content does not include homicidal or suicidal ideation. Thought content does not include homicidal or suicidal plan.        Cognition and Memory: Cognition is impaired. She exhibits impaired recent memory.     Comments: Insight & judgment still fair. Scattered thought chronically with some hyperverbal and overinclusiveness.  Not as fidgety as last time.  Affect more relaxed. irritable       Lab Review:     Component Value Date/Time   NA 139 09/11/2020 2000   NA 140 01/18/2016 1418   NA 139 08/27/2013 0930   K 3.8 09/11/2020 2000   K 4.2 08/27/2013 0930   CL 102 09/11/2020 2000   CL 108 (H) 08/27/2013 0930   CO2 28 09/11/2020 2000   CO2 28 08/27/2013 0930   GLUCOSE 112 (H) 09/11/2020 2000   GLUCOSE 98 08/27/2013 0930   BUN 11 09/11/2020 2000   BUN 17 01/18/2016 1418   BUN 11 08/27/2013 0930   CREATININE 1.00 09/11/2020 2000   CREATININE 0.99 07/11/2020 1558   CALCIUM 9.5 09/11/2020 2000   CALCIUM 8.5 08/27/2013 0930    PROT 7.6 09/11/2020 2000  PROT 6.6 01/18/2016 1418   PROT 7.2 08/27/2013 0930   ALBUMIN 4.3 09/11/2020 2000   ALBUMIN 4.4 01/18/2016 1418   ALBUMIN 3.5 08/27/2013 0930   AST 15 09/11/2020 2000   AST 20 08/27/2013 0930   ALT 14 09/11/2020 2000   ALT 19 08/27/2013 0930   ALKPHOS 95 09/11/2020 2000   ALKPHOS 107 08/27/2013 0930   BILITOT 0.5 09/11/2020 2000   BILITOT <0.2 01/18/2016 1418   BILITOT 0.3 08/27/2013 0930   GFRNONAA >60 09/11/2020 2000   GFRNONAA 63 07/11/2020 1558   GFRAA >60 08/31/2020 1633   GFRAA 73 07/11/2020 1558       Component Value Date/Time   WBC 10.5 10/17/2020 1149   RBC 4.03 10/17/2020 1149   HGB 12.4 10/17/2020 1149   HGB 12.3 01/23/2016 1231   HCT 38.8 10/17/2020 1149   HCT 38.6 01/23/2016 1231   PLT 265 10/17/2020 1149   PLT 280 01/23/2016 1231   MCV 96.3 10/17/2020 1149   MCV 95 01/23/2016 1231   MCV 92 03/24/2015 1244   MCH 30.8 10/17/2020 1149   MCHC 32.0 10/17/2020 1149   RDW 12.0 10/17/2020 1149   RDW 13.7 01/23/2016 1231   RDW 13.1 03/24/2015 1244   LYMPHSABS 1.5 10/17/2020 1149   LYMPHSABS 2.4 01/23/2016 1231   LYMPHSABS 2.2 03/24/2015 1244   MONOABS 0.6 10/17/2020 1149   MONOABS 0.4 03/24/2015 1244   EOSABS 0.2 10/17/2020 1149   EOSABS 0.1 01/23/2016 1231   EOSABS 0.4 03/24/2015 1244   BASOSABS 0.1 10/17/2020 1149   BASOSABS 0.0 01/23/2016 1231   BASOSABS 0.0 03/24/2015 1244    Lithium Lvl  Date Value Ref Range Status  09/12/2020 0.46 (L) 0.60 - 1.20 mmol/L Final    Comment:    Performed at [facility], [address] [place], [place] [address]     Lab Results  Component Value Date   VALPROATE 102 (H) 12/26/2015     .res Assessment: Plan:    Schizoaffective disorder, bipolar type ([place])  Panic disorder with agoraphobia  Chronic constipation  Long term current use of clozapine   Overall her schizoaffective disorder was under control.  Until she ran out of clozapine bc of difficulty getting it.   This led to more disorganziation, confusion, anxiety and rumination.  Insight is not good.  Not self aware about overinclusiveness.  Discussed the importance of consistent with safety with the clozapine.  If they have difficulty obtaining it in the future please contact us right away.  She missed an off of the clozapine to cause disorganization and psychotic symptoms as well as anxiety.  It would take a while for that to resolve.  Answered questions about clozapine and alternatives. Continue clozapine 100 mg AM and 300 mg HS  Panic disorder controlled. Can switch to clonazepam 0.5 mg in am and 1.0 mg HS.  Lithium level ordered 0.46 low normal.   ANC stable the last 6 months. Continue clozapine.   Disc vaccines fears.  Continue lab test per usual.  Discussed the risks and benefits of perhaps schedule ruling out the clozapine levels a little less frequently per the current protocol of the clozapine clinic at [facility].    Chronic constipation.  Not well managed. Previous Increase Linzess trial to 290 mg daily to see if it's better managed but she has been inconsistent with this.  Disc SE. Rec continue consulting PCP or GI about this chronic problem  This appt was 30 mins.  FU  8 weeks  Lynder Parents, MD, DFAPA   Please see After Visit Summary for patient specific instructions.  Future Appointments  Date Time Provider [place]  11/15/2020  9:00 AM [name], MD [facility] None  01/12/2021  2:40 PM Steele Sizer, MD [facility]    No orders of the defined types were placed in this encounter.     -------------------------------

## 2020-11-15 ENCOUNTER — Other Ambulatory Visit
Admission: RE | Admit: 2020-11-15 | Discharge: 2020-11-15 | Disposition: A | Discharge status: Home / Self Care | Payer: 59 | Attending: Psychiatry | Admitting: Psychiatry

## 2020-11-15 ENCOUNTER — Other Ambulatory Visit: Payer: Self-pay

## 2020-11-15 ENCOUNTER — Ambulatory Visit (INDEPENDENT_AMBULATORY_CARE_PROVIDER_SITE_OTHER): Payer: 59 | Admitting: Gastroenterology

## 2020-11-15 ENCOUNTER — Other Ambulatory Visit: Payer: Self-pay | Admitting: Psychiatry

## 2020-11-15 VITALS — BP 117/74 | HR 88 | Temp 98.1°F | Ht 64.0 in | Wt 128.0 lb

## 2020-11-15 DIAGNOSIS — R194 Change in bowel habit: Secondary | ICD-10-CM

## 2020-11-15 DIAGNOSIS — K5900 Constipation, unspecified: Secondary | ICD-10-CM | POA: Diagnosis not present

## 2020-11-15 DIAGNOSIS — Z79899 Other long term (current) drug therapy: Secondary | ICD-10-CM | POA: Diagnosis present

## 2020-11-15 DIAGNOSIS — F250 Schizoaffective disorder, bipolar type: Secondary | ICD-10-CM | POA: Diagnosis not present

## 2020-11-15 LAB — CBC WITH DIFFERENTIAL/PLATELET
Abs Immature Granulocytes: 0.02 10*3/uL (ref 0.00–0.07)
Basophils Absolute: 0.1 10*3/uL (ref 0.0–0.1)
Basophils Relative: 1 %
Eosinophils Absolute: 0.3 10*3/uL (ref 0.0–0.5)
Eosinophils Relative: 4 %
HCT: 40.1 % (ref 36.0–46.0)
Hemoglobin: 12.5 g/dL (ref 12.0–15.0)
Immature Granulocytes: 0 %
Lymphocytes Relative: 21 %
Lymphs Abs: 1.6 10*3/uL (ref 0.7–4.0)
MCH: 30 pg (ref 26.0–34.0)
MCHC: 31.2 g/dL (ref 30.0–36.0)
MCV: 96.4 fL (ref 80.0–100.0)
Monocytes Absolute: 0.4 10*3/uL (ref 0.1–1.0)
Monocytes Relative: 5 %
Neutro Abs: 5.2 10*3/uL (ref 1.7–7.7)
Neutrophils Relative %: 69 %
Platelets: 236 10*3/uL (ref 150–400)
RBC: 4.16 MIL/uL (ref 3.87–5.11)
RDW: 12.2 % (ref 11.5–15.5)
WBC: 7.6 10*3/uL (ref 4.0–10.5)
nRBC: 0 % (ref 0.0–0.2)

## 2020-11-15 MED ORDER — NA SULFATE-K SULFATE-MG SULF 17.5-3.13-1.6 GM/177ML PO SOLN: 1.0000 | Freq: Once | ORAL | 0 refills | Status: AC

## 2020-11-15 NOTE — Progress Notes (Signed)
[name] MD, MRCP(U.K) [address]  [place]  [place], [place] [address]  Main: [phone]  Fax: [phone]   Gastroenterology Consultation  Referring Provider:     Delsa Grana, PA-C Primary Care Physician:  Steele Sizer, MD Primary Gastroenterologist:  Dr. [name]  Reason for Consultation:     Chronic constipation        HPI:   Carla Thompson is a 59 y.o. y/o female referred for chronic constipation, weight loss and incontinence of stool.  Recent ER visit.10/17/2020 hemoglobin 12.4 g on lithium. 09/12/2020 presented to the ER with constipation.  Been on Linzess 145 mcg in addition to stool softeners. Last colonoscopy reported in 2014 which was normal. 09/12/2020 x-ray of the abdomen shows moderate colonic stool burden.  She is here today with her husband.  Suffers from chronic constipation for over 20 years.  Recollects that during childbirth she had a large vaginal tear extending into her rectum i.e. fourth degree which required surgery.  Since then had issues and at some point had a surgical sphincteroplasty.  Has tried many agents including Linzess 145 mcg and MiraLAX, Dulcolax, Colace which have not given consistent results.  She can go for days without having a bowel movement.  She takes clozapine and lithium.  Denies any blood in the stool but has noticed recently a change in the shape of her stool.   Past Medical History:  Diagnosis Date  . Abnormal perimenopausal bleeding   . Bowel incontinence   . Chronic constipation   . Dermatitis   . High risk medication use   . Hyperglycemia   . Hypertriglyceridemia   . Iron deficiency anemia due to chronic blood loss    secondary to heavy flow  . Metallic taste   . Schizoaffective disorder, bipolar type ([facility])    Dr. Tyrone Sage    Past Surgical History:  Procedure Laterality Date  . ANUS SURGERY  2000   [place]  . TUBAL LIGATION  12/02/1994    Prior to Admission medications   Medication  Sig Start Date End Date Taking? Authorizing Provider  clonazePAM (KLONOPIN) 0.5 MG tablet TAKE 1 AND 1/2 TABLETS  BY MOUTH EVERY MORNING, AND 1 AND 1/2 TABLETS AT BEDTIME 10/04/20   Cottle, Billey Co., MD  cloZAPine (CLOZARIL) 100 MG tablet TAKE 1 TABLET BY MOUTH EVERY MORNING, AND 3 EVERY NIGHT AT BEDTIME 10/30/20   Cottle, Billey Co., MD  docusate sodium (COLACE) 100 MG capsule Take 100 mg by mouth daily. Patient not taking: Reported on 11/15/2020    [provider]  LINZESS 145 MCG CAPS capsule Take 145 mcg by mouth daily. Patient not taking: Reported on 11/15/2020 05/06/20   [provider]  lithium 300 MG tablet TAKE 2 TABLETS(600 MG) BY MOUTH AT BEDTIME 07/27/20   Cottle, Billey Co., MD  metoprolol tartrate (LOPRESSOR) 25 MG tablet TAKE 1/2 TABLET(12.5 MG) BY MOUTH TWICE DAILY Patient taking differently: Take 12.5 mg by mouth in the morning and at bedtime. TAKE 1/2 TABLET(12.5 MG) BY MOUTH TWICE DAILY 08/08/20   Ancil Boozer, Drue Stager, MD  polyethylene glycol powder (GLYCOLAX/MIRALAX) 17 GM/SCOOP powder Take 17 g by mouth 2 (two) times daily as needed (constipation). 09/04/20   Delsa Grana, PA-C  Vitamin D, Ergocalciferol, (DRISDOL) 1.25 MG (50000 UNIT) CAPS capsule TAKE ONE CAPSULE BY MOUTH WEEKLY 08/28/20   Steele Sizer, MD    Family History  Problem Relation Age of Onset  . Mental illness Sister  Schizophrenia and bipolar  . Cancer Maternal Grandmother        Colon  . Mental illness Paternal Grandmother        Schizophrenia  . Breast cancer Neg Hx      Social History   Tobacco Use  . Smoking status: Never Smoker  . Smokeless tobacco: Never Used  Vaping Use  . Vaping Use: Never used  Substance Use Topics  . Alcohol use: No    Alcohol/week: 0.0 standard drinks  . Drug use: No    Allergies as of 11/15/2020 - Review Complete 11/15/2020  Allergen Reaction Noted  . Levsin [hyoscyamine sulfate] Hives 12/08/2015  . Escitalopram Hives 12/08/2015    Review  of Systems:    All systems reviewed and negative except where noted in HPI.   Physical Exam:  BP 117/74   Pulse 88   Temp 98.1 F (36.7 C)   Ht 5\' 4"  (1.626 m)   Wt 128 lb (58.1 kg)   LMP 12/16/2016 (Approximate)   BMI 21.97 kg/m  Patient's last menstrual period was 12/16/2016 (approximate). Psych:  Alert and cooperative. Normal mood and affect. General:   Alert,  Well-developed, well-nourished, pleasant and cooperative in NAD Head:  Normocephalic and atraumatic. Lungs:  Respirations even and unlabored.  Clear throughout to auscultation.   No wheezes, crackles, or rhonchi. No acute distress. Heart:  Regular rate and rhythm; no murmurs, clicks, rubs, or gallops. Abdomen:  Normal bowel sounds.  No bruits.  Soft, non-tender and non-distended without masses, hepatosplenomegaly or hernias noted.  No guarding or rebound tenderness.    Neurologic:  Alert and oriented x3;  grossly normal neurologically. Psych:  Alert and cooperative. Normal mood and affect.  Imaging Studies: No results found.  Assessment and Plan:   Carla Thompson is a 59 y.o. y/o female has been referred for chronic constipation over 20 years.  Very likely events during childbirth including episiotomy, fourth degree tear, possibility of nerve damage may be contributing to her chronic constipation.  Has failed multiple over-the-counter agents.  Recent change in the shape of her stool.  She is on lithium and clozapine.  No recent TSH available on epic.  Plan 1.  Diagnostic colonoscopy to rule out any obstruction.  Very likely she has a degree of fecal incontinence due to overflow diarrhea. 2.  Commence on Trulance after colonoscopy samples provided for 2 weeks. 3.  I will perform rectal exam to assess anal tone during her procedure. 4.  Check TSH, calcium vitamin D as she is on lithium.  I have discussed alternative options, risks & benefits,  which include, but are not limited to, bleeding, infection,  perforation,respiratory complication & drug reaction.  The patient agrees with this plan & written consent will be obtained.     Follow up in 2 to 3 weeks after her colonoscopy  Dr [name] MD,MRCP(U.K)

## 2020-11-20 ENCOUNTER — Other Ambulatory Visit: Payer: Self-pay | Admitting: Family Medicine

## 2020-11-20 DIAGNOSIS — E559 Vitamin D deficiency, unspecified: Secondary | ICD-10-CM

## 2020-11-20 NOTE — Telephone Encounter (Signed)
Notes to clinic:  ZERO refills remain on this prescription. Your patient is requesting advance approval of refills for this medication to [facility]   Requested Prescriptions  Pending Prescriptions Disp Refills   Vitamin D, Ergocalciferol, (DRISDOL) 1.25 MG (50000 UNIT) CAPS capsule [Pharmacy Med Name: VITAMIN D2 50,000IU (ERGO) CAP RX] 12 capsule 0    Sig: TAKE ONE CAPSULE BY MOUTH WEEKLY      Endocrinology:  Vitamins - Vitamin D Supplementation Failed - 11/20/2020  8:04 AM      Failed - 50,000 IU strengths are not delegated      Failed - Phosphate in normal range and within 360 days    No results found for: PHOS        Passed - Ca in normal range and within 360 days    Calcium  Date Value Ref Range Status  11/15/2020 9.8 8.7 - 10.2 mg/dL Final   Calcium, Total  Date Value Ref Range Status  08/27/2013 8.5 8.5 - 10.1 mg/dL Final          Passed - Vitamin D in normal range and within 360 days    Vitamin D2 1, 25 (OH)2  Date Value Ref Range Status  11/15/2020 WILL FOLLOW  Preliminary   Vitamin D3 1, 25 (OH)2  Date Value Ref Range Status  11/15/2020 WILL FOLLOW  Preliminary   Vitamin D 1, 25 (OH)2 Total  Date Value Ref Range Status  11/15/2020 WILL FOLLOW  Preliminary   Vit D, 25-Hydroxy  Date Value Ref Range Status  07/11/2020 85 30 - 100 ng/mL Final    Comment:    Vitamin D Status         25-OH Vitamin D: . Deficiency:                    <20 ng/mL Insufficiency:             20 - 29 ng/mL Optimal:                 > or = 30 ng/mL . For 25-OH Vitamin D testing on patients on  D2-supplementation and patients for whom quantitation  of D2 and D3 fractions is required, the [employer](TM) 25-OH VIT D, (D2,D3), LC/MS/MS is recommended: order  code [phone] (patients >66yrs). See Note 1 . Note 1 . For additional information, please refer to  http://education.Quest[facility]ostics.com/faq/FAQ199  (This link is being provided for informational/ educational  purposes only.)           Passed - Valid encounter within last 12 months    Recent Outpatient Visits           1 month ago Schizoaffective disorder, bipolar type [facility])   [facility] Steele Sizer, MD   1 month ago Encounter for examination following treatment at hospital   [facility], MD   2 months ago Chronic constipation   [facility] Delsa Grana, PA-C   4 months ago Chronic constipation   [facility] Steele Sizer, MD   11 months ago Hypertriglyceridemia   [facility] Steele Sizer, MD       Future Appointments             In 1 month [name], MD [facility]   In 1 month Steele Sizer, MD [facility]

## 2020-11-21 ENCOUNTER — Other Ambulatory Visit: Admission: RE | Admit: 2020-11-21 | Payer: 59 | Source: Ambulatory Visit

## 2020-11-22 ENCOUNTER — Telehealth: Payer: Self-pay

## 2020-11-22 NOTE — Telephone Encounter (Signed)
Patient was unable to go for the Covid test today. I told patient we can reschedule her for the 27th or 30th. She will speak with her husband to see if those dates will work. Pt plans to call in the morning with an answer.

## 2020-11-23 ENCOUNTER — Ambulatory Visit: Admission: RE | Admit: 2020-11-23 | Payer: 59 | Source: Home / Self Care | Admitting: Gastroenterology

## 2020-11-23 ENCOUNTER — Encounter: Admission: RE | Payer: Self-pay | Source: Home / Self Care

## 2020-11-23 ENCOUNTER — Other Ambulatory Visit: Payer: Self-pay

## 2020-11-23 DIAGNOSIS — R194 Change in bowel habit: Secondary | ICD-10-CM | POA: Insufficient documentation

## 2020-11-23 DIAGNOSIS — K5900 Constipation, unspecified: Secondary | ICD-10-CM

## 2020-11-23 SURGERY — COLONOSCOPY WITH PROPOFOL
Anesthesia: General

## 2020-11-23 MED ORDER — NA SULFATE-K SULFATE-MG SULF 17.5-3.13-1.6 GM/177ML PO SOLN: 1.0000 | Freq: Once | ORAL | 0 refills | Status: AC

## 2020-11-24 LAB — TSH: TSH: 1.7 u[IU]/mL (ref 0.450–4.500)

## 2020-11-24 LAB — VITAMIN D 1,25 DIHYDROXY
Vitamin D 1, 25 (OH)2 Total: 21 pg/mL
Vitamin D2 1, 25 (OH)2: 16 pg/mL
Vitamin D3 1, 25 (OH)2: <10 pg/mL

## 2020-11-24 LAB — CALCIUM: Calcium: 9.8 mg/dL (ref 8.7–10.2)

## 2020-11-28 ENCOUNTER — Other Ambulatory Visit: Payer: Self-pay

## 2020-11-28 ENCOUNTER — Telehealth: Payer: Self-pay

## 2020-11-28 ENCOUNTER — Other Ambulatory Visit
Admission: RE | Admit: 2020-11-28 | Discharge: 2020-11-28 | Disposition: A | Discharge status: Home / Self Care | Payer: 59 | Source: Ambulatory Visit | Attending: Gastroenterology | Admitting: Gastroenterology

## 2020-11-28 DIAGNOSIS — Z01812 Encounter for preprocedural laboratory examination: Secondary | ICD-10-CM | POA: Insufficient documentation

## 2020-11-28 DIAGNOSIS — Z20822 Contact with and (suspected) exposure to covid-19: Secondary | ICD-10-CM | POA: Insufficient documentation

## 2020-11-28 NOTE — Telephone Encounter (Signed)
Called pt to inform of results and Dr. Anna's recommendations.  Unable to contact, LVM to return call 

## 2020-11-28 NOTE — Telephone Encounter (Signed)
-----   Message from Wyline Mood, MD sent at 11/27/2020 12:37 PM EST ----- Carla Thompson   Inform patient that thyroid is normal as well as calcium.  Vitamin D is on the lower end of normal.  I would suggest her to discuss with Dr. Carlynn Purl to see if she would benefit from supplementation.  No abnormalities in her labs to explain her constipation.  Proceed with evaluation as per my last office note  Dr Wyline Mood MD,MRCP [facility]) Gastroenterology/Hepatology Pager: [phone]

## 2020-11-29 LAB — SARS CORONAVIRUS 2 (TAT 6-24 HRS): SARS Coronavirus 2: NEGATIVE

## 2020-11-30 ENCOUNTER — Ambulatory Visit
Admission: RE | Admit: 2020-11-30 | Discharge: 2020-11-30 | Disposition: A | Discharge status: Home / Self Care | Payer: 59 | Source: Ambulatory Visit | Attending: Gastroenterology | Admitting: Gastroenterology

## 2020-11-30 ENCOUNTER — Ambulatory Visit: Payer: 59 | Admitting: Anesthesiology

## 2020-11-30 ENCOUNTER — Other Ambulatory Visit: Payer: Self-pay

## 2020-11-30 ENCOUNTER — Encounter: Payer: Self-pay | Admitting: Gastroenterology

## 2020-11-30 ENCOUNTER — Encounter
Admission: RE | Disposition: A | Discharge status: Home / Self Care | Payer: Self-pay | Source: Ambulatory Visit | Attending: Gastroenterology

## 2020-11-30 DIAGNOSIS — K5900 Constipation, unspecified: Secondary | ICD-10-CM | POA: Insufficient documentation

## 2020-11-30 DIAGNOSIS — Z79899 Other long term (current) drug therapy: Secondary | ICD-10-CM | POA: Diagnosis not present

## 2020-11-30 DIAGNOSIS — Z800 Family history of malignant neoplasm of digestive organs: Secondary | ICD-10-CM | POA: Diagnosis not present

## 2020-11-30 DIAGNOSIS — R194 Change in bowel habit: Secondary | ICD-10-CM

## 2020-11-30 DIAGNOSIS — Z888 Allergy status to other drugs, medicaments and biological substances status: Secondary | ICD-10-CM | POA: Insufficient documentation

## 2020-11-30 HISTORY — DX: Schizoaffective disorder, unspecified: F25.9

## 2020-11-30 HISTORY — PX: COLONOSCOPY WITH PROPOFOL: SHX5780

## 2020-11-30 SURGERY — COLONOSCOPY WITH PROPOFOL
Anesthesia: General

## 2020-11-30 MED ORDER — LIDOCAINE HCL (CARDIAC) PF 100 MG/5ML IV SOSY
PREFILLED_SYRINGE | INTRAVENOUS | Status: DC | PRN
  Administered 2020-11-30: 100 mg via INTRAVENOUS

## 2020-11-30 MED ORDER — LIDOCAINE HCL (PF) 2 % IJ SOLN
INTRAMUSCULAR | Status: AC
  Filled 2020-11-30: qty 5

## 2020-11-30 MED ORDER — PROPOFOL 500 MG/50ML IV EMUL
INTRAVENOUS | Status: DC | PRN
  Administered 2020-11-30: 144 ug/kg/min via INTRAVENOUS

## 2020-11-30 MED ORDER — SODIUM CHLORIDE 0.9 % IV SOLN: INTRAVENOUS | Status: DC

## 2020-11-30 MED ORDER — PROPOFOL 10 MG/ML IV BOLUS
INTRAVENOUS | Status: AC
  Filled 2020-11-30: qty 60

## 2020-11-30 NOTE — H&P (Signed)
Carla Bellows, MD [address] [place], [place], [place], [address] [address], [place], [place], [place], [address] Phone: [phone]  Fax: [phone]  Primary Care Physician:  Steele Sizer, MD   Pre-Procedure History & Physical: HPI:  Carla Thompson is a 59 y.o. female is here for an colonoscopy.   Past Medical History:  Diagnosis Date  . Abnormal perimenopausal bleeding   . Bowel incontinence   . Chronic constipation   . Dermatitis   . High risk medication use   . Hyperglycemia   . Hypertriglyceridemia   . Iron deficiency anemia due to chronic blood loss    secondary to heavy flow  . Metallic taste   . Schizo-affective psychosis ([place])   . Schizoaffective disorder, bipolar type ([facility])    Dr. Tyrone Sage    Past Surgical History:  Procedure Laterality Date  . ANUS SURGERY  2000   [place]  . COLONOSCOPY    . TUBAL LIGATION  12/02/1994    Prior to Admission medications   Medication Sig Start Date End Date Taking? Authorizing Provider  clonazePAM (KLONOPIN) 0.5 MG tablet TAKE 1 AND 1/2 TABLETS  BY MOUTH EVERY MORNING, AND 1 AND 1/2 TABLETS AT BEDTIME 10/04/20  Yes Cottle, Billey Co., MD  cloZAPine (CLOZARIL) 100 MG tablet TAKE 1 TABLET BY MOUTH EVERY MORNING, AND 3 EVERY NIGHT AT BEDTIME 10/30/20  Yes Cottle, Billey Co., MD  lithium 300 MG tablet TAKE 2 TABLETS(600 MG) BY MOUTH AT BEDTIME 07/27/20  Yes Cottle, Billey Co., MD  metoprolol tartrate (LOPRESSOR) 25 MG tablet TAKE 1/2 TABLET(12.5 MG) BY MOUTH TWICE DAILY Patient taking differently: Take 12.5 mg by mouth in the morning and at bedtime. TAKE 1/2 TABLET(12.5 MG) BY MOUTH TWICE DAILY 08/08/20  Yes Sowles, Drue Stager, MD  polyethylene glycol powder (GLYCOLAX/MIRALAX) 17 GM/SCOOP powder Take 17 g by mouth 2 (two) times daily as needed (constipation). 09/04/20  Yes Delsa Grana, PA-C  Vitamin D, Ergocalciferol, (DRISDOL) 1.25 MG (50000 UNIT) CAPS capsule TAKE ONE CAPSULE BY MOUTH WEEKLY 11/20/20   Yes Sowles, Drue Stager, MD  docusate sodium (COLACE) 100 MG capsule Take 100 mg by mouth daily. Patient not taking: Reported on 11/15/2020    [provider]  LINZESS 145 MCG CAPS capsule Take 145 mcg by mouth daily. Patient not taking: Reported on 11/15/2020 05/06/20   [provider]    Allergies as of 11/23/2020 - Review Complete 11/15/2020  Allergen Reaction Noted  . Levsin [hyoscyamine sulfate] Hives 12/08/2015  . Escitalopram Hives 12/08/2015    Family History  Problem Relation Age of Onset  . Mental illness Sister        Schizophrenia and bipolar  . Cancer Maternal Grandmother        Colon  . Mental illness Paternal Grandmother        Schizophrenia  . Breast cancer Neg Hx     Social History   Socioeconomic History  . Marital status: Married    Spouse name: [name]  . Number of children: 2  . Years of education: Not on file  . Highest education level: Bachelor's degree (e.g., BA, AB, BS)  Occupational History  . Not on file  Tobacco Use  . Smoking status: Never Smoker  . Smokeless tobacco: Never Used  Vaping Use  . Vaping Use: Never used  Substance and Sexual Activity  . Alcohol use: No    Alcohol/week: 0.0 standard drinks  . Drug use: No  . Sexual activity: Not  Currently    Partners: Male  Other Topics Concern  . Not on file  Social History Narrative  . Not on file   Social Determinants of Health   Financial Resource Strain: Low Risk   . Difficulty of Paying Living Expenses: Not hard at all  Food Insecurity: No Food Insecurity  . Worried About Programme researcher, broadcasting/film/video in the Last Year: Never true  . Ran Out of Food in the Last Year: Never true  Transportation Needs: No Transportation Needs  . Lack of Transportation (Medical): No  . Lack of Transportation (Non-Medical): No  Physical Activity: Insufficiently Active  . Days of Exercise per Week: 7 days  . Minutes of Exercise per Session: 10 min  Stress: No Stress Concern Present  . Feeling of  Stress : Not at all  Social Connections: Socially Integrated  . Frequency of Communication with Friends and Family: More than three times a week  . Frequency of Social Gatherings with Friends and Family: More than three times a week  . Attends Religious Services: More than 4 times per year  . Active Member of Clubs or Organizations: Yes  . Attends Banker Meetings: More than 4 times per year  . Marital Status: Married  Catering manager Violence: Not At Risk  . Fear of Current or Ex-Partner: No  . Emotionally Abused: No  . Physically Abused: No  . Sexually Abused: No    Review of Systems: See HPI, otherwise negative ROS  Physical Exam: BP 108/66   Pulse 72   Temp (!) 96.1 F (35.6 C) (Temporal)   Resp 18   Ht 5\' 4"  (1.626 m)   Wt 58.1 kg   LMP 12/16/2016 (Approximate)   SpO2 100%   BMI 21.97 kg/m  General:   Alert,  pleasant and cooperative in NAD Head:  Normocephalic and atraumatic. Neck:  Supple; no masses or thyromegaly. Lungs:  Clear throughout to auscultation, normal respiratory effort.    Heart:  +S1, +S2, Regular rate and rhythm, No edema. Abdomen:  Soft, nontender and nondistended. Normal bowel sounds, without guarding, and without rebound.   Neurologic:  Alert and  oriented x4;  grossly normal neurologically.  Impression/Plan: Carla Thompson is here for an colonoscopy to be performed for  Constipation. Risks, benefits, limitations, and alternatives regarding  colonoscopy have been reviewed with the patient.  Questions have been answered.  All parties agreeable.   Lawerance Sabal, MD  11/30/2020, 9:48 AM

## 2020-11-30 NOTE — Anesthesia Preprocedure Evaluation (Signed)
Anesthesia Evaluation  Patient identified by MRN, date of birth, ID band Patient awake    Reviewed: Allergy & Precautions, H&P , NPO status , Patient's Chart, lab work & pertinent test results, reviewed documented beta blocker date and time   Airway Mallampati: II   Neck ROM: full    Dental  (+) Poor Dentition   Pulmonary neg pulmonary ROS,    Pulmonary exam normal        Cardiovascular Exercise Tolerance: Good negative cardio ROS Normal cardiovascular exam Rhythm:regular Rate:Normal     Neuro/Psych PSYCHIATRIC DISORDERS Bipolar Disorder Schizophrenia negative neurological ROS     GI/Hepatic Neg liver ROS, GERD  Medicated,  Endo/Other  negative endocrine ROS  Renal/GU negative Renal ROS  negative genitourinary   Musculoskeletal   Abdominal   Peds  Hematology  (+) Blood dyscrasia, anemia ,   Anesthesia Other Findings Past Medical History: No date: Abnormal perimenopausal bleeding No date: Bowel incontinence No date: Chronic constipation No date: Dermatitis No date: High risk medication use No date: Hyperglycemia No date: Hypertriglyceridemia No date: Iron deficiency anemia due to chronic blood loss     Comment:  secondary to heavy flow No date: Metallic taste No date: Schizoaffective disorder, bipolar type ([facility])     Comment:  Dr. Lucille Passy Past Surgical History: 2000: ANUS SURGERY     Comment:  [place] 12/02/1994: TUBAL LIGATION   Reproductive/Obstetrics negative OB ROS                             Anesthesia Physical Anesthesia Plan  ASA: III  Anesthesia Plan: General   Post-op Pain Management:    Induction:   PONV Risk Score and Plan:   Airway Management Planned:   Additional Equipment:   Intra-op Plan:   Post-operative Plan:   Informed Consent: I have reviewed the patients History and Physical, chart, labs and discussed the procedure including the  risks, benefits and alternatives for the proposed anesthesia with the patient or authorized representative who has indicated his/her understanding and acceptance.     Dental Advisory Given  Plan Discussed with: CRNA  Anesthesia Plan Comments:         Anesthesia Quick Evaluation

## 2020-11-30 NOTE — Transfer of Care (Signed)
Immediate Anesthesia Transfer of Care Note  Patient: Carla Thompson  Procedure(s) Performed: COLONOSCOPY WITH PROPOFOL (N/A )  Patient Location: PACU and Endoscopy Unit  Anesthesia Type:General  Level of Consciousness: sedated  Airway & Oxygen Therapy: Patient Spontanous Breathing and Patient connected to nasal cannula oxygen  Post-op Assessment: Report given to RN and Post -op Vital signs reviewed and stable  Post vital signs: Reviewed and stable  Last Vitals:  Vitals Value Taken Time  BP 88/59 11/30/20 1023  Temp    Pulse 64 11/30/20 1023  Resp 10 11/30/20 1023  SpO2 100 % 11/30/20 1023  Vitals shown include unvalidated device data.  Last Pain:  Vitals:   11/30/20 0917  TempSrc: Temporal  PainSc: 0-No pain         Complications: No complications documented.

## 2020-11-30 NOTE — Op Note (Signed)
[facility] Patient Name: Carla Thompson Procedure Date: 11/30/2020 9:48 AM MRN: [id number] Account #: [id number] Date of Birth: Feb 02, 1961 Admit Type: Outpatient Age: 59 Room: [facility] ENDO ROOM 1 Gender: Female Note Status: Finalized Procedure:             Colonoscopy Indications:           Constipation Providers:             [name] MD, MD Referring MD:          Bethena Roys. Sowles, MD (Referring MD) Medicines:             Monitored Anesthesia Care Complications:         No immediate complications. Procedure:             Pre-Anesthesia Assessment:                        - Prior to the procedure, a History and Physical was                         performed, and patient medications, allergies and                         sensitivities were reviewed. The patient's tolerance                         of previous anesthesia was reviewed.                        - The risks and benefits of the procedure and the                         sedation options and risks were discussed with the                         patient. All questions were answered and informed                         consent was obtained.                        - ASA Grade Assessment: II - A patient with mild                         systemic disease.                        After obtaining informed consent, the colonoscope was                         passed under direct vision. Throughout the procedure,                         the patient's blood pressure, pulse, and oxygen                         saturations were monitored continuously. The                         Colonoscope was introduced through the anus and  advanced to the the cecum, identified by the                         appendiceal orifice. The colonoscopy was performed                         with ease. The patient tolerated the procedure well.                         The quality of the bowel preparation was  adequate. Findings:      The perianal and digital rectal examinations were normal.      The colon (entire examined portion) appeared normal.      Lumen was tortious Impression:            - The entire examined colon is normal.                        - No specimens collected. Recommendation:        - Discharge patient to home (with escort).                        - Resume previous diet.                        - Continue present medications.                        - Return to my office as previously scheduled. Procedure Code(s):     --- Professional ---                        [id number], Colonoscopy, flexible; diagnostic, including                         collection of specimen(s) by brushing or washing, when                         performed (separate procedure) Diagnosis Code(s):     --- Professional ---                        K59.00, Constipation, unspecified CPT copyright 2019 American Medical Association. All rights reserved. The codes documented in this report are preliminary and upon coder review may  be revised to meet current compliance requirements. Wyline Mood, MD Wyline Mood MD, MD 11/30/2020 10:21:02 AM This report has been signed electronically. Number of Addenda: 0 Note Initiated On: 11/30/2020 9:48 AM Scope Withdrawal Time: 0 hours 10 minutes 38 seconds  Total Procedure Duration: 0 hours 18 minutes 44 seconds  Estimated Blood Loss:  Estimated blood loss: none.      [facility]

## 2020-12-05 ENCOUNTER — Other Ambulatory Visit: Payer: Self-pay | Admitting: Family Medicine

## 2020-12-05 ENCOUNTER — Telehealth: Payer: Self-pay

## 2020-12-05 NOTE — Telephone Encounter (Signed)
Patient verbalized understanding of results and will call her PCP office

## 2020-12-05 NOTE — Telephone Encounter (Signed)
Pt got labs from GI and they told her her vit D was low and that she needs to talk to dr. Carlynn Purl about taking vit D.  CB# [phone]

## 2020-12-05 NOTE — Telephone Encounter (Signed)
Copied from CRM [id number]. Topic: General - Other >> Dec 05, 2020 11:36 AM Lyn Hollingshead D wrote: PT need a call back to go over GI labs results / please advise

## 2020-12-07 ENCOUNTER — Telehealth: Payer: Self-pay | Admitting: Gastroenterology

## 2020-12-07 NOTE — Telephone Encounter (Signed)
Pt has samples of [place] given by Dr Tobi Bastos, asking for Rx called in for this to [facility] on [address]. Please advise

## 2020-12-07 NOTE — Anesthesia Postprocedure Evaluation (Signed)
Anesthesia Post Note  Patient: Carla Thompson  Procedure(s) Performed: COLONOSCOPY WITH PROPOFOL (N/A )  Patient location during evaluation: PACU Anesthesia Type: General Level of consciousness: awake and alert Pain management: pain level controlled Vital Signs Assessment: post-procedure vital signs reviewed and stable Respiratory status: spontaneous breathing, nonlabored ventilation, respiratory function stable and patient connected to nasal cannula oxygen Cardiovascular status: blood pressure returned to baseline and stable Postop Assessment: no apparent nausea or vomiting Anesthetic complications: no   No complications documented.   Last Vitals:  Vitals:   11/30/20 1022 11/30/20 1052  BP: (!) 88/59 106/62  Pulse: 64   Resp: (!) 9   Temp: (!) 35.6 C   SpO2: 100%     Last Pain:  Vitals:   11/30/20 1052  TempSrc:   PainSc: 0-No pain                 Yevette Edwards

## 2020-12-11 NOTE — Telephone Encounter (Signed)
Message has been forwarded to Dr. Vicente Males to advise patient.

## 2020-12-11 NOTE — Telephone Encounter (Signed)
Pt called again waiting for return call on Trullance

## 2020-12-12 ENCOUNTER — Other Ambulatory Visit: Payer: Self-pay

## 2020-12-12 ENCOUNTER — Telehealth: Payer: Self-pay

## 2020-12-12 MED ORDER — TRULANCE 3 MG PO TABS: 3.0000 mg | ORAL_TABLET | Freq: Every day | ORAL | 2 refills | Status: AC

## 2020-12-12 NOTE — Telephone Encounter (Signed)
Medication has been sent to the pharmacy. 

## 2020-12-15 ENCOUNTER — Other Ambulatory Visit
Admission: RE | Admit: 2020-12-15 | Discharge: 2020-12-15 | Disposition: A | Discharge status: Home / Self Care | Payer: 59 | Attending: Psychiatry | Admitting: Psychiatry

## 2020-12-15 ENCOUNTER — Other Ambulatory Visit: Payer: Self-pay

## 2020-12-15 DIAGNOSIS — F250 Schizoaffective disorder, bipolar type: Secondary | ICD-10-CM | POA: Insufficient documentation

## 2020-12-15 DIAGNOSIS — Z79899 Other long term (current) drug therapy: Secondary | ICD-10-CM | POA: Diagnosis present

## 2020-12-15 LAB — CBC WITH DIFFERENTIAL/PLATELET
Abs Immature Granulocytes: 0.04 10*3/uL (ref 0.00–0.07)
Basophils Absolute: 0.1 10*3/uL (ref 0.0–0.1)
Basophils Relative: 1 %
Eosinophils Absolute: 0.2 10*3/uL (ref 0.0–0.5)
Eosinophils Relative: 2 %
HCT: 38.9 % (ref 36.0–46.0)
Hemoglobin: 12.7 g/dL (ref 12.0–15.0)
Immature Granulocytes: 0 %
Lymphocytes Relative: 19 %
Lymphs Abs: 2 10*3/uL (ref 0.7–4.0)
MCH: 30.7 pg (ref 26.0–34.0)
MCHC: 32.6 g/dL (ref 30.0–36.0)
MCV: 94 fL (ref 80.0–100.0)
Monocytes Absolute: 0.5 10*3/uL (ref 0.1–1.0)
Monocytes Relative: 5 %
Neutro Abs: 7.8 10*3/uL — ABNORMAL HIGH (ref 1.7–7.7)
Neutrophils Relative %: 73 %
Platelets: 275 10*3/uL (ref 150–400)
RBC: 4.14 MIL/uL (ref 3.87–5.11)
RDW: 12.9 % (ref 11.5–15.5)
WBC: 10.7 10*3/uL — ABNORMAL HIGH (ref 4.0–10.5)
nRBC: 0 % (ref 0.0–0.2)

## 2021-01-03 ENCOUNTER — Ambulatory Visit: Payer: 59 | Admitting: Gastroenterology

## 2021-01-08 ENCOUNTER — Other Ambulatory Visit: Payer: Self-pay | Admitting: Family Medicine

## 2021-01-08 DIAGNOSIS — Z1231 Encounter for screening mammogram for malignant neoplasm of breast: Secondary | ICD-10-CM

## 2021-01-09 ENCOUNTER — Ambulatory Visit: Payer: 59 | Admitting: Psychiatry

## 2021-01-12 ENCOUNTER — Ambulatory Visit: Payer: 59 | Admitting: Family Medicine

## 2021-01-15 ENCOUNTER — Other Ambulatory Visit: Payer: Self-pay | Admitting: Psychiatry

## 2021-01-15 ENCOUNTER — Other Ambulatory Visit: Payer: Self-pay

## 2021-01-15 ENCOUNTER — Other Ambulatory Visit
Admission: RE | Admit: 2021-01-15 | Discharge: 2021-01-15 | Disposition: A | Discharge status: Home / Self Care | Payer: 59 | Attending: Psychiatry | Admitting: Psychiatry

## 2021-01-15 DIAGNOSIS — F250 Schizoaffective disorder, bipolar type: Secondary | ICD-10-CM

## 2021-01-15 DIAGNOSIS — Z79899 Other long term (current) drug therapy: Secondary | ICD-10-CM | POA: Diagnosis present

## 2021-01-15 LAB — CBC WITH DIFFERENTIAL/PLATELET
Abs Immature Granulocytes: 0.02 10*3/uL (ref 0.00–0.07)
Basophils Absolute: 0.1 10*3/uL (ref 0.0–0.1)
Basophils Relative: 1 %
Eosinophils Absolute: 0.2 10*3/uL (ref 0.0–0.5)
Eosinophils Relative: 2 %
HCT: 39.3 % (ref 36.0–46.0)
Hemoglobin: 12.6 g/dL (ref 12.0–15.0)
Immature Granulocytes: 0 %
Lymphocytes Relative: 24 %
Lymphs Abs: 2.1 10*3/uL (ref 0.7–4.0)
MCH: 30.4 pg (ref 26.0–34.0)
MCHC: 32.1 g/dL (ref 30.0–36.0)
MCV: 94.7 fL (ref 80.0–100.0)
Monocytes Absolute: 0.5 10*3/uL (ref 0.1–1.0)
Monocytes Relative: 6 %
Neutro Abs: 6.2 10*3/uL (ref 1.7–7.7)
Neutrophils Relative %: 67 %
Platelets: 249 10*3/uL (ref 150–400)
RBC: 4.15 MIL/uL (ref 3.87–5.11)
RDW: 12.7 % (ref 11.5–15.5)
WBC: 9.1 10*3/uL (ref 4.0–10.5)
nRBC: 0 % (ref 0.0–0.2)

## 2021-01-18 ENCOUNTER — Encounter: Payer: Self-pay | Admitting: Psychiatry

## 2021-01-18 ENCOUNTER — Other Ambulatory Visit: Payer: Self-pay | Admitting: Psychiatry

## 2021-01-18 ENCOUNTER — Telehealth: Payer: Self-pay | Admitting: Psychiatry

## 2021-01-18 DIAGNOSIS — F250 Schizoaffective disorder, bipolar type: Secondary | ICD-10-CM

## 2021-01-18 MED ORDER — CLOZAPINE 100 MG PO TABS
ORAL_TABLET | ORAL | 6 refills | Status: DC
Start: 2021-01-18 — End: 2021-06-20

## 2021-01-18 NOTE — Telephone Encounter (Signed)
I entered the laboratory results into [facility] on February 15.  I will reorder the clozapine just to make sure she is not out of refills.  Please fax the lab results to the pharmacy to make sure they get it.  This is been the pharmacy that has been a problem because they did not reenroll in [facility] apparently.

## 2021-01-18 NOTE — Telephone Encounter (Signed)
Pt left message asking about labs and Rx to [facility]. Apt 3/22 Labs in system from 01/15/21

## 2021-01-19 ENCOUNTER — Telehealth: Payer: Self-pay | Admitting: Psychiatry

## 2021-01-19 NOTE — Telephone Encounter (Signed)
reviewed

## 2021-01-19 NOTE — Telephone Encounter (Signed)
I contacted the pharmacy the clozapine is ready for pick up

## 2021-01-19 NOTE — Telephone Encounter (Signed)
I entered the necessary [place] results myself into [facility].  This pharmacy has had issues of not filling the prescription because they could not see the results because they had not reenrolled in [facility] as is required.  As a result we have been having to fax over the lab results.  Please contact the pharmacy ASAP and make sure they have everything they need to fill the prescription.  If the fax machine is not working you can give the results to them verbally over the phone directly to the pharmacist.  This patient gets psychotic when she misses her clozapine.  Let me know if there is a problem.

## 2021-01-19 NOTE — Telephone Encounter (Signed)
I could not get husband on the phone so I called Carla Thompson and informed her it was ready for pick up at [facility] on [address].

## 2021-01-19 NOTE — Telephone Encounter (Signed)
I called the pharmacy,the clozapine is ready to be picked up and she should not have any issues getting it.

## 2021-01-19 NOTE — Telephone Encounter (Signed)
Our fax machine hasn't been working since yesterday I believe but it might be this afternoon.  Do they not log on to [facility]?

## 2021-01-19 NOTE — Telephone Encounter (Signed)
Patient called in today need lab results sent over to the [facility] on [address] [facility]. [phone]

## 2021-01-19 NOTE — Telephone Encounter (Signed)
This patient needs a lot of direction bc easily confused.  Please call her or her husband and let them know the RX is ready for pickup

## 2021-01-31 NOTE — Progress Notes (Signed)
Name: Carla Thompson   MRN: [id number]    DOB: 11/27/1961   Date:02/02/2021       Progress Note  Subjective  Chief Complaint  Failed DT  I connected with  Carla Thompson  on 02/02/21 at 11:20 AM EST by a video enabled telemedicine application and verified that I am speaking with the correct person using two identifiers.  I discussed the limitations of evaluation and management by telemedicine and the availability of in person appointments. The patient expressed understanding and agreed to proceed with the virtual visit  Staff also discussed with the patient that there may be a patient responsible charge related to this service. Patient Location: at home  Provider Location: [facility] Additional Individuals present: husband   HPI  COVID-19 symptoms : husband was sick and tested positive a few weeks ago, she developed symptoms about one month ago, chills, sore throat, she is still not feeling back to normal  When she walked in the office she said she was having body ache and rhinorrhea so she was sent to the car, but when I spoke to her she states she is feeling well . She did not get COVID-19 vaccines  Rash on her foot: over the past two weeks and is using some old topical medication , but does not seem to be improving. She still has pain on both heels. Worse when walking   Schizoaffective disorder: compliant with visits with psychiatrist and also medications.   GERD: she has been taking antacids daily to control her heartburn, we will try pepcid rx and see if symptoms are better controlled. She has episodes of nausea and regurgitation  Patient Active Problem List   Diagnosis Date Noted  . Change in bowel habits 11/23/2020  . Hyperlipidemia 11/20/2017  . GERD without esophagitis 02/21/2017  . Tachycardia 02/20/2016  . RLS (restless legs syndrome) 12/21/2015  . IBS (irritable bowel syndrome) 12/21/2015  . Other long term (current) drug therapy 12/08/2015  . Constipation 12/08/2015  .  Insomnia, persistent 12/08/2015  . Dermatitis, eczematoid 12/08/2015  . Gravida 2 para 2 12/08/2015  . Hypertriglyceridemia 12/08/2015  . Metallic taste 00/17/4944  . 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 Schizoaffective disorder, bipolar type ([place]) 12/08/2015    Past Surgical History:  Procedure Laterality Date  . ANUS SURGERY  2000   [place]  . COLONOSCOPY    . COLONOSCOPY WITH PROPOFOL N/A 11/30/2020   Procedure: COLONOSCOPY WITH PROPOFOL;  Surgeon: [name], MD;  Location: [facility] ENDOSCOPY;  Service: Gastroenterology;  Laterality: N/A;  . TUBAL LIGATION  12/02/1994    Family History  Problem Relation Age of Onset  . Mental illness Sister        Schizophrenia and bipolar  . Cancer Maternal Grandmother        Colon  . Mental illness Paternal Grandmother        Schizophrenia  . Breast cancer Neg Hx     Social History   Socioeconomic History  . Marital status: Married    Spouse name: [name]  . Number of children: 2  . Years of education: Not on file  . Highest education level: Bachelor's degree (e.g., BA, AB, BS)  Occupational History  . Not on file  Tobacco Use  . Smoking status: Never Smoker  . Smokeless tobacco: Never Used  Vaping Use  . Vaping Use: Never used  Substance and Sexual Activity  . Alcohol use: No    Alcohol/week: 0.0 standard drinks  . Drug use: No  .  Sexual activity: Not Currently    Partners: Male  Other Topics Concern  . Not on file  Social History Narrative  . Not on file   Social Determinants of Health   Financial Resource Strain: Low Risk   . Difficulty of Paying Living Expenses: Not hard at all  Food Insecurity: No Food Insecurity  . Worried About Charity fundraiser in the Last Year: Never true  . Ran Out of Food in the Last Year: Never true  Transportation Needs: No Transportation Needs  . Lack of Transportation (Medical): No  . Lack of Transportation (Non-Medical): No  Physical Activity: Insufficiently Active  . Days of  Exercise per Week: 7 days  . Minutes of Exercise per Session: 10 min  Stress: No Stress Concern Present  . Feeling of Stress : Not at all  Social Connections: Socially Integrated  . Frequency of Communication with Friends and Family: More than three times a week  . Frequency of Social Gatherings with Friends and Family: More than three times a week  . Attends Religious Services: More than 4 times per year  . Active Member of Clubs or Organizations: Yes  . Attends Archivist Meetings: More than 4 times per year  . Marital Status: Married  Human resources officer Violence: Not At Risk  . Fear of Current or Ex-Partner: No  . Emotionally Abused: No  . Physically Abused: No  . Sexually Abused: No     Current Outpatient Medications:  .  clonazePAM (KLONOPIN) 0.5 MG tablet, TAKE 1 AND 1/2 TABLETS  BY MOUTH EVERY MORNING, AND 1 AND 1/2 TABLETS AT BEDTIME, Disp: 90 tablet, Rfl: 5 .  cloZAPine (CLOZARIL) 100 MG tablet, TAKE 1 TABLET BY MOUTH EVERY MORNING, AND 3 EVERY NIGHT AT BEDTIME, Disp: 120 tablet, Rfl: 6 .  lithium 300 MG tablet, TAKE 2 TABLETS(600 MG) BY MOUTH AT BEDTIME, Disp: 180 tablet, Rfl: 1 .  metoprolol tartrate (LOPRESSOR) 25 MG tablet, TAKE 1/2 TABLET(12.5 MG) BY MOUTH TWICE DAILY (Patient taking differently: Take 12.5 mg by mouth in the morning and at bedtime. TAKE 1/2 TABLET(12.5 MG) BY MOUTH TWICE DAILY), Disp: 90 tablet, Rfl: 1 .  Plecanatide (TRULANCE) 3 MG TABS, Take 3 mg by mouth daily., Disp: 90 tablet, Rfl: 2 .  polyethylene glycol powder (GLYCOLAX/MIRALAX) 17 GM/SCOOP powder, Take 17 g by mouth 2 (two) times daily as needed (constipation)., Disp: 578 g, Rfl: 1 .  Vitamin D, Ergocalciferol, (DRISDOL) 1.25 MG (50000 UNIT) CAPS capsule, TAKE ONE CAPSULE BY MOUTH WEEKLY, Disp: 12 capsule, Rfl: 0 .  zinc gluconate 50 MG tablet, Take 50 mg by mouth daily., Disp: , Rfl:  .  docusate sodium (COLACE) 100 MG capsule, Take 100 mg by mouth daily. (Patient not taking: No sig  reported), Disp: , Rfl:   Allergies  Allergen Reactions  . Levsin [Hyoscyamine Sulfate] Hives  . Escitalopram Hives    I personally reviewed active problem list, medication list, allergies, family history, social history, health maintenance with the patient/caregiver today.   ROS  Ten systems reviewed and is negative except as mentioned in HPI   Objective  Virtual encounter, vitals not obtained.  There is no height or weight on file to calculate BMI.  Physical Exam  Very pale, awake , alert and cooperative  PHQ2/9: Depression screen [facility] 2/9 02/02/2021 10/04/2020 09/28/2020 09/04/2020 07/11/2020  Decreased Interest 0 3 3 2  0  Down, Depressed, Hopeless 0 3 3 1  0  PHQ - 2 Score 0 6 6  3 0  Altered sleeping 0 3 - - 0  Tired, decreased energy 3 3 - - 1  Change in appetite 0 3 - - 0  Feeling bad or failure about yourself  0 2 - - 0  Trouble concentrating 1 3 - - 0  Moving slowly or fidgety/restless 0 2 - - 1  Suicidal thoughts 0 0 - - 0  PHQ-9 Score 4 22 - - 2  Difficult doing work/chores - Very difficult - - Somewhat difficult  Some recent data might be hidden   PHQ-2/9 Result is negative.    Fall Risk: Fall Risk  02/02/2021 10/04/2020 09/28/2020 09/04/2020 07/11/2020  Falls in the past year? 1 1 1 1 1   Comment - - - - -  Number falls in past yr: 0 1 1 1  0  Comment - - - - -  Injury with Fall? 0 0 0 0 0  Comment - - - - -  Risk for fall due to : - History of fall(s) - - -  Follow up - - - Falls evaluation completed -     Assessment & Plan  1. Heel pain, bilateral  - Ambulatory referral to Podiatry  2. Rash of foot  - Ambulatory referral to Podiatry  3. GERD without esophagitis  - famotidine (PEPCID) 40 MG tablet; Take 1 tablet (40 mg total) by mouth daily.  Dispense: 90 tablet; Refill: 1  4. Schizoaffective disorder, bipolar type ([place])  Stable   I discussed the assessment and treatment plan with the patient. The patient was provided an opportunity to ask  questions and all were answered. The patient agreed with the plan and demonstrated an understanding of the instructions.  The patient was advised to call back or seek an in-person evaluation if the symptoms worsen or if the condition fails to improve as anticipated.  I provided  minutes of non-face-to-face time during this encounter.

## 2021-02-02 ENCOUNTER — Encounter: Payer: Self-pay | Admitting: Family Medicine

## 2021-02-02 ENCOUNTER — Telehealth (INDEPENDENT_AMBULATORY_CARE_PROVIDER_SITE_OTHER): Payer: 59 | Admitting: Family Medicine

## 2021-02-02 ENCOUNTER — Other Ambulatory Visit: Payer: Self-pay

## 2021-02-02 DIAGNOSIS — M79671 Pain in right foot: Secondary | ICD-10-CM | POA: Diagnosis not present

## 2021-02-02 DIAGNOSIS — F250 Schizoaffective disorder, bipolar type: Secondary | ICD-10-CM | POA: Diagnosis not present

## 2021-02-02 DIAGNOSIS — K219 Gastro-esophageal reflux disease without esophagitis: Secondary | ICD-10-CM

## 2021-02-02 DIAGNOSIS — R21 Rash and other nonspecific skin eruption: Secondary | ICD-10-CM

## 2021-02-02 DIAGNOSIS — M79672 Pain in left foot: Secondary | ICD-10-CM

## 2021-02-02 DIAGNOSIS — Z20822 Contact with and (suspected) exposure to covid-19: Secondary | ICD-10-CM

## 2021-02-02 MED ORDER — FAMOTIDINE 40 MG PO TABS: 40.0000 mg | ORAL_TABLET | Freq: Every day | ORAL | 1 refills | Status: DC

## 2021-02-03 LAB — NOVEL CORONAVIRUS, NAA: SARS-CoV-2, NAA: NOT DETECTED

## 2021-02-03 LAB — SARS-COV-2, NAA 2 DAY TAT

## 2021-02-08 ENCOUNTER — Ambulatory Visit: Payer: Self-pay | Admitting: Podiatry

## 2021-02-13 ENCOUNTER — Other Ambulatory Visit: Payer: Self-pay | Admitting: Psychiatry

## 2021-02-13 ENCOUNTER — Telehealth: Payer: Self-pay | Admitting: Psychiatry

## 2021-02-13 DIAGNOSIS — Z79899 Other long term (current) drug therapy: Secondary | ICD-10-CM

## 2021-02-13 DIAGNOSIS — F250 Schizoaffective disorder, bipolar type: Secondary | ICD-10-CM

## 2021-02-13 NOTE — Telephone Encounter (Signed)
[facility] needs a recent lab order for [facility]. The one they have now expired on 06/14/20. Please fax to [phone].[facility] needs a recent lab order for [facility]. The one they have now expired on 06/14/20. Please fax to [phone].

## 2021-02-13 NOTE — Telephone Encounter (Signed)
Lab order for CBC with diff faxed per request

## 2021-02-14 ENCOUNTER — Other Ambulatory Visit: Payer: Self-pay | Admitting: Family Medicine

## 2021-02-14 DIAGNOSIS — R000 Tachycardia, unspecified: Secondary | ICD-10-CM

## 2021-02-14 NOTE — Telephone Encounter (Signed)
Requested Prescriptions  Pending Prescriptions Disp Refills  . metoprolol tartrate (LOPRESSOR) 25 MG tablet [Pharmacy Med Name: METOPROLOL TARTRATE 25MG  TABLETS] 90 tablet 0    Sig: TAKE 1/2 TABLET(12.5 MG) BY MOUTH TWICE DAILY     Cardiovascular:  Beta Blockers Passed - 02/14/2021  6:19 AM      Passed - Last BP in normal range    BP Readings from Last 1 Encounters:  11/30/20 106/62         Passed - Last Heart Rate in normal range    Pulse Readings from Last 1 Encounters:  11/30/20 64         Passed - Valid encounter within last 6 months    Recent Outpatient Visits          1 week ago Heel pain, bilateral   [facility] [place], Drue Stager, MD   4 months ago Schizoaffective disorder, bipolar type [facility])   [facility] Steele Sizer, MD   4 months ago Encounter for examination following treatment at hospital   [place], MD   5 months ago Chronic constipation   [facility] [place], Kristeen Miss, PA-C   7 months ago Chronic constipation   [facility] Steele Sizer, MD      Future Appointments            In 1 week [name], MD [place]   In 2 months Steele Sizer, MD [facility]

## 2021-02-16 ENCOUNTER — Ambulatory Visit (INDEPENDENT_AMBULATORY_CARE_PROVIDER_SITE_OTHER): Payer: 59 | Admitting: Podiatry

## 2021-02-16 ENCOUNTER — Encounter: Payer: Self-pay | Admitting: Podiatry

## 2021-02-16 ENCOUNTER — Other Ambulatory Visit
Admission: RE | Admit: 2021-02-16 | Discharge: 2021-02-16 | Disposition: A | Discharge status: Home / Self Care | Payer: 59 | Attending: Psychiatry | Admitting: Psychiatry

## 2021-02-16 ENCOUNTER — Ambulatory Visit (INDEPENDENT_AMBULATORY_CARE_PROVIDER_SITE_OTHER): Payer: 59

## 2021-02-16 ENCOUNTER — Other Ambulatory Visit: Payer: Self-pay

## 2021-02-16 DIAGNOSIS — F250 Schizoaffective disorder, bipolar type: Secondary | ICD-10-CM | POA: Diagnosis present

## 2021-02-16 DIAGNOSIS — M722 Plantar fascial fibromatosis: Secondary | ICD-10-CM | POA: Diagnosis not present

## 2021-02-16 DIAGNOSIS — L309 Dermatitis, unspecified: Secondary | ICD-10-CM | POA: Diagnosis not present

## 2021-02-16 DIAGNOSIS — Z79899 Other long term (current) drug therapy: Secondary | ICD-10-CM | POA: Insufficient documentation

## 2021-02-16 LAB — CBC WITH DIFFERENTIAL/PLATELET
Abs Immature Granulocytes: 0.04 10*3/uL (ref 0.00–0.07)
Basophils Absolute: 0 10*3/uL (ref 0.0–0.1)
Basophils Relative: 0 %
Eosinophils Absolute: 0.2 10*3/uL (ref 0.0–0.5)
Eosinophils Relative: 2 %
HCT: 39 % (ref 36.0–46.0)
Hemoglobin: 12.1 g/dL (ref 12.0–15.0)
Immature Granulocytes: 0 %
Lymphocytes Relative: 16 %
Lymphs Abs: 1.9 10*3/uL (ref 0.7–4.0)
MCH: 30.2 pg (ref 26.0–34.0)
MCHC: 31 g/dL (ref 30.0–36.0)
MCV: 97.3 fL (ref 80.0–100.0)
Monocytes Absolute: 0.9 10*3/uL (ref 0.1–1.0)
Monocytes Relative: 7 %
Neutro Abs: 9.1 10*3/uL — ABNORMAL HIGH (ref 1.7–7.7)
Neutrophils Relative %: 75 %
Platelets: 262 10*3/uL (ref 150–400)
RBC: 4.01 MIL/uL (ref 3.87–5.11)
RDW: 13.2 % (ref 11.5–15.5)
WBC: 12.2 10*3/uL — ABNORMAL HIGH (ref 4.0–10.5)
nRBC: 0 % (ref 0.0–0.2)

## 2021-02-16 MED ORDER — BETAMETHASONE SOD PHOS & ACET 6 (3-3) MG/ML IJ SUSP
3.0000 mg | Freq: Once | INTRAMUSCULAR | Status: AC
  Administered 2021-02-16: 3 mg via INTRA_ARTICULAR

## 2021-02-16 MED ORDER — CLOTRIMAZOLE-BETAMETHASONE 1-0.05 % EX CREA: 1.0000 "application " | TOPICAL_CREAM | Freq: Two times a day (BID) | CUTANEOUS | 1 refills | Status: DC

## 2021-02-16 NOTE — Progress Notes (Signed)
   Subjective: 60 y.o. female presenting today with her husband as a new patient for evaluation of bilateral heel pain.  Patient states is been going on for approximately 1 month now.  Most painful when she gets out of bed in the mornings.  She has not done anything for treatment. She also states that she has some skin lesion to the left foot.  She has tried hydrocortisone cream with minimal improvement.   Past Medical History:  Diagnosis Date  . Abnormal perimenopausal bleeding   . Bowel incontinence   . Chronic constipation   . Dermatitis   . High risk medication use   . Hyperglycemia   . Hypertriglyceridemia   . Iron deficiency anemia due to chronic blood loss    secondary to heavy flow  . Metallic taste   . Schizo-affective psychosis ([place])   . Schizoaffective disorder, bipolar type ([place])    Dr. Tyrone Sage     Objective: Physical Exam General: The patient is alert and oriented x3 in no acute distress.  Dermatology: Skin is warm, dry and supple bilateral lower extremities. Negative for open lesions or macerations bilateral.  There is some very small pinpoint vesicles noted along the plantar arch and first MTPJ of the left foot.  Currently they are asymptomatic with minimal drainage or inflammation noted  Vascular: Dorsalis Pedis and Posterior Tibial pulses palpable bilateral.  Capillary fill time is immediate to all digits.  Neurological: Epicritic and protective threshold intact bilateral.   Musculoskeletal: Tenderness to palpation to the plantar aspect of the bilateral heels along the plantar fascia. All other joints range of motion within normal limits bilateral. Strength 5/5 in all groups bilateral.   Radiographic exam: Normal osseous mineralization. Joint spaces preserved. No fracture/dislocation/boney destruction. No other soft tissue abnormalities or radiopaque foreign bodies.   Assessment: 1. plantar fasciitis bilateral feet  Plan of Care:  1. Patient  evaluated. Xrays reviewed.   2. Injection of 0.5cc Celestone soluspan injected into the bilateral heels.  3.  OTC power step insoles provided today 4.  Recommend good supportive shoes, even around the house 5.  Prescription for Lotrisone cream applied daily to the skin lesions 6.  Return to clinic as needed  Edrick Kins, DPM [facility]  Dr. Edrick Kins, DPM    [address], [place] [address]                Office [phone]  Fax [phone]

## 2021-02-16 NOTE — Progress Notes (Signed)
This is the patient that has had difficulty getting clozapine unless we actually physically fax the lab results to the pharmacy.  It goes to the pharmacy listed in [place] not the 1 in [place].  Please fax it ASAP.

## 2021-02-20 ENCOUNTER — Ambulatory Visit (INDEPENDENT_AMBULATORY_CARE_PROVIDER_SITE_OTHER): Payer: 59 | Admitting: Psychiatry

## 2021-02-20 ENCOUNTER — Encounter: Payer: Self-pay | Admitting: Psychiatry

## 2021-02-20 ENCOUNTER — Other Ambulatory Visit: Payer: Self-pay

## 2021-02-20 DIAGNOSIS — Z79899 Other long term (current) drug therapy: Secondary | ICD-10-CM

## 2021-02-20 DIAGNOSIS — K5909 Other constipation: Secondary | ICD-10-CM | POA: Diagnosis not present

## 2021-02-20 DIAGNOSIS — F250 Schizoaffective disorder, bipolar type: Secondary | ICD-10-CM

## 2021-02-20 DIAGNOSIS — F4001 Agoraphobia with panic disorder: Secondary | ICD-10-CM | POA: Diagnosis not present

## 2021-02-20 NOTE — Progress Notes (Signed)
YARED SUSAN [id number] 1961-09-08 60 y.o.  Subjective:   Patient ID:  Carla Thompson is a 60 y.o. (DOB 1961/06/27) female.  Chief Complaint:  Chief Complaint  Patient presents with  . Follow-up  . Schizoaffective disorder, bipolar type ([place])  . Anxiety  . Medication Problem    constipation    HPI TYNEISHA HEGEMAN presents to the office today for follow-up of schizoaffective disorder.  visit October, 2020.  No med changes.  She continued on clozapine 400, lithium 600, and clonazepam 0.5 twice daily as needed.   03/09/20 appt noted: StepF had been sick with Covid and other problems.  Died.  Other family members with Covid problems too and died. [name] worked from home 11-18-2023 to April  Good overall without complaints.  Stress dealing with Covid.  Trying to stay active including cooking.  No major episodes of confusion nor voices.    No fear.  Does emails on the computer.  Not much anxiety lately.  Sleep same 9-9.  No fear episodes.  No manic nor depressive episodes.  No significant panic attacks recently.  She is not avoiding things out of anxiety generally.  She's still active at church.   Large infrequent BM clog toilet and intermittently not well managed.  Plan: Still problems with large infrequent q 4-7 days, large BM.  Often clogs the toilet.  Not well managed. Increase Linzess trial to 290 mg daily to see if it's better managed.  09/07/20 appt with following noted: She has notes. Balance problems, movements in feet legs, stomach.  Marching movements in feet.  Scares her. 1 fall on stairs going up. Wonders about reducing clozapine to 3 instead of 4 bc of tiredness. B researched lithium and said lithium and clozapine are "bad medicines with a lot of SE".   she's worried about SE. Disc ran out of clozapine for 2 weeks and decompensated.  back on it for almost a month.  Pharmacy was saying they couldn't dispense clozapine without labs and they didn't have the labs.  H says she's  not back to normal yet bc can't focus and make simple decisions.  For example couldn't decide whether to drink water out of the faucet or out of Brita water filter.  Lost 10# and not eating well.  Poor appetite.   Severely constipated chronically.  Using miralax.  Plan: continue clozapine  11/09/20 appt with following noted:  Seen with Loretha Brasil [place] problems with chronic constipation and using Miralax twice daily.  It is helpful but varies.  Doesn't like clozapine for this reason.  Usual trouble is leakage from the stool.  Disc sphincter exercises.   Lost weight. Some depression over the TD including feet moving up and down.  Someitmes stomach moves.  Occ tremor but not lately.  Trouble with sleep, initial and middle. Some of the problems with getting clozapine may have been related to not getting th labs to the correct [facility] in [place].  Doesn't like prunes or prune juice..  Not attending church due to having to wear depends and "tardive dyskinesia".   Plan: Can switch to clonazepam 0.5 mg in am and 1.0 mg HS  02/20/2021 appointment with the following noted: Walks outside.   [name] got Covid for 3 weeks.  She got milder sx with assumption she had it also. No problems getting clozapine since here.  Have worked on getting it more consistently and that seems to be working.    Better than 6 mos ago.  Stronger  physically and mentally.  No sig panic attacks now. Balance is better.  Weight loss stopped.  Not depressed.   Better with more consistency with clozapine. Exercising more and that's helped.    Oldest daughter married March 2020  On clozapine for many years with benefit.  Several psych hospitalizations.  She is failed multiple other psychiatric medications which is why she is on clozapine.  She is also failed or relapsed with dosage reduction of clozapine or if she missed clozapine.  She is consistent with her dosage now.  Past Psychiatric Medication Trials: Citrucil, Miralax, stool  softener, Mg tablets  Sister has schizophrenia.  Review of Systems:  Review of Systems  Cardiovascular: Negative for palpitations.  Gastrointestinal: Positive for abdominal distention, constipation and diarrhea. Negative for abdominal pain.  Neurological: Negative for tremors.  Psychiatric/Behavioral: Positive for confusion, decreased concentration, dysphoric mood and sleep disturbance. Negative for agitation, behavioral problems, hallucinations, self-injury and suicidal ideas. The patient is not nervous/anxious and is not hyperactive.   Constipation comes and goes  Medications: I have reviewed the patient's current medications.  Current Outpatient Medications  Medication Sig Dispense Refill  . Ascorbic Acid (VITAMIN C) 500 MG CHEW Chew by mouth.    . clonazePAM (KLONOPIN) 0.5 MG tablet TAKE 1 AND 1/2 TABLETS  BY MOUTH EVERY MORNING, AND 1 AND 1/2 TABLETS AT BEDTIME 90 tablet 5  . cloZAPine (CLOZARIL) 100 MG tablet TAKE 1 TABLET BY MOUTH EVERY MORNING, AND 3 EVERY NIGHT AT BEDTIME 120 tablet 6  . famotidine (PEPCID) 40 MG tablet Take 1 tablet (40 mg total) by mouth daily. 90 tablet 1  . lithium 300 MG tablet TAKE 2 TABLETS(600 MG) BY MOUTH AT BEDTIME 180 tablet 1  . metoprolol tartrate (LOPRESSOR) 25 MG tablet TAKE 1/2 TABLET(12.5 MG) BY MOUTH TWICE DAILY 90 tablet 0  . Plecanatide (TRULANCE) 3 MG TABS Take 3 mg by mouth daily. 90 tablet 2  . Vitamin D, Ergocalciferol, (DRISDOL) 1.25 MG (50000 UNIT) CAPS capsule TAKE ONE CAPSULE BY MOUTH WEEKLY 12 capsule 0  . zinc gluconate 50 MG tablet Take 50 mg by mouth daily.    . clotrimazole-betamethasone (LOTRISONE) cream Apply 1 application topically 2 (two) times daily. (Patient not taking: Reported on 02/20/2021) 45 g 1  . docusate sodium (COLACE) 100 MG capsule Take 100 mg by mouth daily. (Patient not taking: No sig reported)    . polyethylene glycol powder (GLYCOLAX/MIRALAX) 17 GM/SCOOP powder Take 17 g by mouth 2 (two) times daily as needed  (constipation). (Patient not taking: Reported on 02/20/2021) 578 g 1   No current facility-administered medications for this visit.    Medication Side Effects: Other: alternating contstipation and diarrhea but has IBS  Allergies:  Allergies  Allergen Reactions  . Levsin [Hyoscyamine Sulfate] Hives  . Escitalopram Hives    Past Medical History:  Diagnosis Date  . Abnormal perimenopausal bleeding   . Bowel incontinence   . Chronic constipation   . Dermatitis   . High risk medication use   . Hyperglycemia   . Hypertriglyceridemia   . Iron deficiency anemia due to chronic blood loss    secondary to heavy flow  . Metallic taste   . Schizo-affective psychosis ([place])   . Schizoaffective disorder, bipolar type ([place])    Dr. Tyrone Sage    Family History  Problem Relation Age of Onset  . Mental illness Sister        Schizophrenia and bipolar  . Cancer Maternal Grandmother  Colon  . Mental illness Paternal Grandmother        Schizophrenia  . Breast cancer Neg Hx     Social History   Socioeconomic History  . Marital status: Married    Spouse name: [name]  . Number of children: 2  . Years of education: Not on file  . Highest education level: Bachelor's degree (e.g., BA, AB, BS)  Occupational History  . Not on file  Tobacco Use  . Smoking status: Never Smoker  . Smokeless tobacco: Never Used  Vaping Use  . Vaping Use: Never used  Substance and Sexual Activity  . Alcohol use: No    Alcohol/week: 0.0 standard drinks  . Drug use: No  . Sexual activity: Not Currently    Partners: Male  Other Topics Concern  . Not on file  Social History Narrative  . Not on file   Social Determinants of Health   Financial Resource Strain: Low Risk   . Difficulty of Paying Living Expenses: Not hard at all  Food Insecurity: No Food Insecurity  . Worried About Charity fundraiser in the Last Year: Never true  . Ran Out of Food in the Last Year: Never true  Transportation  Needs: No Transportation Needs  . Lack of Transportation (Medical): No  . Lack of Transportation (Non-Medical): No  Physical Activity: Insufficiently Active  . Days of Exercise per Week: 7 days  . Minutes of Exercise per Session: 10 min  Stress: No Stress Concern Present  . Feeling of Stress : Not at all  Social Connections: Socially Integrated  . Frequency of Communication with Friends and Family: More than three times a week  . Frequency of Social Gatherings with Friends and Family: More than three times a week  . Attends Religious Services: More than 4 times per year  . Active Member of Clubs or Organizations: Yes  . Attends Archivist Meetings: More than 4 times per year  . Marital Status: Married  Human resources officer Violence: Not At Risk  . Fear of Current or Ex-Partner: No  . Emotionally Abused: No  . Physically Abused: No  . Sexually Abused: No    Past Medical History, Surgical history, Social history, and Family history were reviewed and updated as appropriate.   Please see review of systems for further details on the patient's review from today.   Objective:   Physical Exam:  LMP 12/16/2016 (Approximate)   Physical Exam Constitutional:      General: She is not in acute distress.    Appearance: She is well-developed.  Musculoskeletal:        General: No deformity.  Neurological:     Mental Status: She is alert and oriented to person, place, and time.     Cranial Nerves: No dysarthria.     Coordination: Coordination normal.  Psychiatric:        Attention and Perception: Perception normal. She is inattentive. She does not perceive auditory or visual hallucinations.        Mood and Affect: Mood is anxious. Mood is not depressed. Affect is not labile, blunt, angry, tearful or inappropriate.        Speech: Speech is tangential. Speech is not rapid and pressured or slurred.        Behavior: Behavior normal. Behavior is cooperative.        Thought Content:  Thought content is paranoid. Thought content is not delusional. Thought content does not include homicidal or suicidal ideation. Thought content does not  include homicidal or suicidal plan.        Cognition and Memory: Cognition is impaired. She exhibits impaired recent memory.     Comments: Insight & judgment still fair. Scattered thought chronically but less hyperverbal and overinclusiveness.  Not as fidgety as last time.  Affect more relaxed. Less irritable       Lab Review:     Component Value Date/Time   NA 139 09/11/2020 2000   NA 140 01/18/2016 1418   NA 139 08/27/2013 0930   K 3.8 09/11/2020 2000   K 4.2 08/27/2013 0930   CL 102 09/11/2020 2000   CL 108 (H) 08/27/2013 0930   CO2 28 09/11/2020 2000   CO2 28 08/27/2013 0930   GLUCOSE 112 (H) 09/11/2020 2000   GLUCOSE 98 08/27/2013 0930   BUN 11 09/11/2020 2000   BUN 17 01/18/2016 1418   BUN 11 08/27/2013 0930   CREATININE 1.00 09/11/2020 2000   CREATININE 0.99 07/11/2020 1558   CALCIUM 9.8 11/15/2020 0951   CALCIUM 8.5 08/27/2013 0930   PROT 7.6 09/11/2020 2000   PROT 6.6 01/18/2016 1418   PROT 7.2 08/27/2013 0930   ALBUMIN 4.3 09/11/2020 2000   ALBUMIN 4.4 01/18/2016 1418   ALBUMIN 3.5 08/27/2013 0930   AST 15 09/11/2020 2000   AST 20 08/27/2013 0930   ALT 14 09/11/2020 2000   ALT 19 08/27/2013 0930   ALKPHOS 95 09/11/2020 2000   ALKPHOS 107 08/27/2013 0930   BILITOT 0.5 09/11/2020 2000   BILITOT <0.2 01/18/2016 1418   BILITOT 0.3 08/27/2013 0930   GFRNONAA >60 09/11/2020 2000   GFRNONAA 63 07/11/2020 1558   GFRAA >60 08/31/2020 1633   GFRAA 73 07/11/2020 1558       Component Value Date/Time   WBC 12.2 (H) 02/16/2021 1010   RBC 4.01 02/16/2021 1010   HGB 12.1 02/16/2021 1010   HGB 12.3 01/23/2016 1231   HCT 39.0 02/16/2021 1010   HCT 38.6 01/23/2016 1231   PLT 262 02/16/2021 1010   PLT 280 01/23/2016 1231   MCV 97.3 02/16/2021 1010   MCV 95 01/23/2016 1231   MCV 92 03/24/2015 1244   MCH 30.2  02/16/2021 1010   MCHC 31.0 02/16/2021 1010   RDW 13.2 02/16/2021 1010   RDW 13.7 01/23/2016 1231   RDW 13.1 03/24/2015 1244   LYMPHSABS 1.9 02/16/2021 1010   LYMPHSABS 2.4 01/23/2016 1231   LYMPHSABS 2.2 03/24/2015 1244   MONOABS 0.9 02/16/2021 1010   MONOABS 0.4 03/24/2015 1244   EOSABS 0.2 02/16/2021 1010   EOSABS 0.1 01/23/2016 1231   EOSABS 0.4 03/24/2015 1244   BASOSABS 0.0 02/16/2021 1010   BASOSABS 0.0 01/23/2016 1231   BASOSABS 0.0 03/24/2015 1244    Lithium Lvl  Date Value Ref Range Status  09/12/2020 0.46 (L) 0.60 - 1.20 mmol/L Final    Comment:    Performed at [facility], [address], [place], [place] [address]     Lab Results  Component Value Date   VALPROATE 102 (H) 12/26/2015     .res Assessment: Plan:    Schizoaffective disorder, bipolar type ([place])  Panic disorder with agoraphobia  Chronic constipation  Long term current use of clozapine   Overall her schizoaffective disorder was under control.  Until she ran out of clozapine bc of difficulty getting it last summer/fall 2021.  This led to more disorganziation, confusion, anxiety and rumination.  Insight is not good.  Not self aware about overinclusiveness. All of this is  better now with better clozapine consistency and exercise.  Discussed the importance of consistent with safety with the clozapine.  If they have difficulty obtaining it in the future please contact us right away.  She missed an off of the clozapine to cause disorganization and psychotic symptoms as well as anxiety.  It would take a while for that to resolve.  Answered questions about clozapine and alternatives. Continue clozapine 400 mg HS  (She prefers all at once at night)  Panic disorder controlled. clonazepam 0.75 mg BID  No med changes today  Lithium level ordered 0.46 low normal at last check.   ANC stable the last 6 months. Continue clozapine.    Disc vaccines fears.  Continue lab test per usual.   Discussed the risks and benefits of perhaps schedule ruling out the clozapine levels a little less frequently per the current protocol of the clozapine clinic at [facility].    Stopped Linzess bc of diarrhea.  Exercise helps constipation.  This appt was 30 mins.  FU 8 weeks  Lynder Parents, MD, DFAPA   Please see After Visit Summary for patient specific instructions.  Future Appointments  Date Time Provider [place]  02/22/2021  2:15 PM [name], MD [facility] None  05/14/2021  3:40 PM Steele Sizer, MD [place] [facility]    No orders of the defined types were placed in this encounter.     -------------------------------

## 2021-02-22 ENCOUNTER — Ambulatory Visit (INDEPENDENT_AMBULATORY_CARE_PROVIDER_SITE_OTHER): Payer: 59 | Admitting: Gastroenterology

## 2021-02-22 ENCOUNTER — Other Ambulatory Visit: Payer: Self-pay

## 2021-02-22 VITALS — BP 115/79 | HR 96 | Ht 64.0 in | Wt 130.0 lb

## 2021-02-22 DIAGNOSIS — K5900 Constipation, unspecified: Secondary | ICD-10-CM | POA: Diagnosis not present

## 2021-02-22 MED ORDER — LACTULOSE 10 GM/15ML PO SOLN: 20.0000 g | Freq: Three times a day (TID) | ORAL | 2 refills | Status: AC

## 2021-02-22 NOTE — Progress Notes (Signed)
[name] MD, MRCP(U.K) [address]  [place]  [place], [place] [address]  Main: [phone]  Fax: [phone]   Primary Care Physician: Steele Sizer, MD  Primary Gastroenterologist:  Dr. [name]   Follow-up for constipation  HPI: Carla Thompson is a 60 y.o. female   Summary of history :  Initially referred and seen back in December 2021 for chronic constipation weight loss and incontinence stool.  In October 2021 x-ray of the abdomen showed moderate colonic stool burden.  She had constipation over 20 years.  Due to childbirth had a large vaginal tear extending to the rectum which required surgery and has had a surgical sphincteroplasty.  Has tried many agents in the past including Linzess, MiraLAX, Dulcolax, Colace which have not worked.  She can go for days without a bowel movement.  Interval history 11/15/2020-02/22/2021  At her last visit commenced on Trulance.  I checked a TSH which was normal.  Calcium was also normal along with her vitamin D.  Hemoglobin was 12.5 g.  I performed a colonoscopy in December 2021.  And noted a very tortuous colon  Did very well after starting Trulance , doing much better. Still occasionally has symptoms of constipation.     Current Outpatient Medications  Medication Sig Dispense Refill  . lactulose (CHRONULAC) 10 GM/15ML solution Take 30 mLs (20 g total) by mouth 3 (three) times daily. 2700 mL 2  . Ascorbic Acid (VITAMIN C) 500 MG CHEW Chew by mouth.    . clonazePAM (KLONOPIN) 0.5 MG tablet TAKE 1 AND 1/2 TABLETS  BY MOUTH EVERY MORNING, AND 1 AND 1/2 TABLETS AT BEDTIME 90 tablet 5  . clotrimazole-betamethasone (LOTRISONE) cream Apply 1 application topically 2 (two) times daily. (Patient not taking: Reported on 02/20/2021) 45 g 1  . cloZAPine (CLOZARIL) 100 MG tablet TAKE 1 TABLET BY MOUTH EVERY MORNING, AND 3 EVERY NIGHT AT BEDTIME 120 tablet 6  . docusate sodium (COLACE) 100 MG capsule Take 100 mg by mouth daily. (Patient  not taking: No sig reported)    . famotidine (PEPCID) 40 MG tablet Take 1 tablet (40 mg total) by mouth daily. 90 tablet 1  . lithium 300 MG tablet TAKE 2 TABLETS(600 MG) BY MOUTH AT BEDTIME 180 tablet 1  . metoprolol tartrate (LOPRESSOR) 25 MG tablet TAKE 1/2 TABLET(12.5 MG) BY MOUTH TWICE DAILY 90 tablet 0  . Plecanatide (TRULANCE) 3 MG TABS Take 3 mg by mouth daily. 90 tablet 2  . polyethylene glycol powder (GLYCOLAX/MIRALAX) 17 GM/SCOOP powder Take 17 g by mouth 2 (two) times daily as needed (constipation). (Patient not taking: Reported on 02/20/2021) 578 g 1  . Vitamin D, Ergocalciferol, (DRISDOL) 1.25 MG (50000 UNIT) CAPS capsule TAKE ONE CAPSULE BY MOUTH WEEKLY 12 capsule 0  . zinc gluconate 50 MG tablet Take 50 mg by mouth daily.     No current facility-administered medications for this visit.    Allergies as of 02/22/2021 - Review Complete 02/22/2021  Allergen Reaction Noted  . Levsin [hyoscyamine sulfate] Hives 12/08/2015  . Escitalopram Hives 12/08/2015    ROS:  General: Negative for anorexia, weight loss, fever, chills, fatigue, weakness. ENT: Negative for hoarseness, difficulty swallowing , nasal congestion. CV: Negative for chest pain, angina, palpitations, dyspnea on exertion, peripheral edema.  Respiratory: Negative for dyspnea at rest, dyspnea on exertion, cough, sputum, wheezing.  GI: See history of present illness. GU:  Negative for dysuria, hematuria, urinary incontinence, urinary frequency, nocturnal urination.  Endo: Negative for unusual  weight change.    Physical Examination:   BP 115/79   Pulse 96   Ht 5\' 4"  (1.626 m)   Wt 130 lb (59 kg)   LMP 12/16/2016 (Approximate)   BMI 22.31 kg/m   General: Well-nourished, well-developed in no acute distress.  Eyes: No icterus. Conjunctivae pink. Mouth: Oropharyngeal mucosa moist and pink , no lesions erythema or exudate. Neuro: Alert and oriented x 3.  Grossly intact. Skin: Warm and dry, no jaundice.   Psych:  Alert and cooperative, normal mood and affect.   Imaging Studies: DG Foot Complete Left  Result Date: 02/16/2021 Please see detailed radiograph report in office note.  DG Foot Complete Right  Result Date: 02/16/2021 Please see detailed radiograph report in office note.   Assessment and Plan:   Carla Thompson is a 60 y.o. y/o female here to follow-up for chronic constipation.  Significantly better after commencing on Trulance.  Occasionally has hard stools so we will add lactulose 20 cc as needed 3 times daily to be taken as needed.    Dr [name]  MD,MRCP [facility]) Follow up in as needed

## 2021-03-14 ENCOUNTER — Other Ambulatory Visit: Payer: Self-pay | Admitting: Family Medicine

## 2021-03-14 DIAGNOSIS — E559 Vitamin D deficiency, unspecified: Secondary | ICD-10-CM

## 2021-03-14 MED ORDER — VITAMIN D (ERGOCALCIFEROL) 1.25 MG (50000 UNIT) PO CAPS: 1.0000 | ORAL_CAPSULE | ORAL | 0 refills | Status: DC

## 2021-03-14 NOTE — Telephone Encounter (Signed)
Medication Refill - Medication: Vitamin D, Ergocalciferol, (DRISDOL) 1.25 MG (50000 UNIT) CAPS capsule (patient has 1 pill left and would like request expedited because she is going out of town. No later then Thursday)    Has the patient contacted their pharmacy? Yes.    (Agent: If yes, when and what did the pharmacy advise?) Contact PCP  Preferred Pharmacy (with phone number or street name):   [id number] [address], [place] Phone:  [phone]  Fax:  [phone]       Agent: Please be advised that RX refills may take up to 3 business days. We ask that you follow-up with your pharmacy.

## 2021-03-14 NOTE — Telephone Encounter (Signed)
Requested medication (s) are due for refill today - unknown  Requested medication (s) are on the active medication list -yes  Future visit scheduled -yes  Last refill: 11/20/20 #12  Notes to clinic: Request RF of non delegated Rx  Requested Prescriptions  Pending Prescriptions Disp Refills   Vitamin D, Ergocalciferol, (DRISDOL) 1.25 MG (50000 UNIT) CAPS capsule 12 capsule 0    Sig: Take 1 capsule (50,000 Units total) by mouth once a week.      Endocrinology:  Vitamins - Vitamin D Supplementation Failed - 03/14/2021  2:53 PM      Failed - 50,000 IU strengths are not delegated      Failed - Phosphate in normal range and within 360 days    No results found for: PHOS        Passed - Ca in normal range and within 360 days    Calcium  Date Value Ref Range Status  11/15/2020 9.8 8.7 - 10.2 mg/dL Final   Calcium, Total  Date Value Ref Range Status  08/27/2013 8.5 8.5 - 10.1 mg/dL Final          Passed - Vitamin D in normal range and within 360 days    Vitamin D2 1, 25 (OH)2  Date Value Ref Range Status  11/15/2020 16 pg/mL Final    Comment:    This test was developed and its performance characteristics determined by [facility]. It has not been cleared or approved by the Food and Drug Administration.    Vitamin D3 1, 25 (OH)2  Date Value Ref Range Status  11/15/2020 <10 pg/mL Final    Comment:    This test was developed and its performance characteristics determined by [facility]. It has not been cleared or approved by the Food and Drug Administration.    Vitamin D 1, 25 (OH)2 Total  Date Value Ref Range Status  11/15/2020 21 pg/mL Final    Comment:    Reference Range: Adults: 21 - 65    Vit D, 25-Hydroxy  Date Value Ref Range Status  07/11/2020 85 30 - 100 ng/mL Final    Comment:    Vitamin D Status         25-OH Vitamin D: . Deficiency:                    <20 ng/mL Insufficiency:             20 - 29 ng/mL Optimal:                 > or = 30 ng/mL . For 25-OH  Vitamin D testing on patients on  D2-supplementation and patients for whom quantitation  of D2 and D3 fractions is required, the QuestAssureD(TM) 25-OH VIT D, (D2,D3), LC/MS/MS is recommended: order  code [phone] (patients >28yrs). See Note 1 . Note 1 . For additional information, please refer to  http://education.Quest[facility].com/faq/FAQ199  (This link is being provided for informational/ educational purposes only.)           Passed - Valid encounter within last 12 months    Recent Outpatient Visits           1 month ago Heel pain, bilateral   [facility] [place], Drue Stager, MD   5 months ago Schizoaffective disorder, bipolar type [facility])   [facility] Steele Sizer, MD   5 months ago Encounter for examination following treatment at hospital   [facility] Tow[place] Malkin, MD  6 months ago Chronic constipation   [facility] Delsa Grana, [place]   8 months ago Chronic constipation   [facility] Steele Sizer, MD       Future Appointments             In 2 months Ancil Boozer, Drue Stager, MD [facility], [facility]                 Requested Prescriptions  Pending Prescriptions Disp Refills   Vitamin D, Ergocalciferol, (DRISDOL) 1.25 MG (50000 UNIT) CAPS capsule 12 capsule 0    Sig: Take 1 capsule (50,000 Units total) by mouth once a week.      Endocrinology:  Vitamins - Vitamin D Supplementation Failed - 03/14/2021  2:53 PM      Failed - 50,000 IU strengths are not delegated      Failed - Phosphate in normal range and within 360 days    No results found for: PHOS        Passed - Ca in normal range and within 360 days    Calcium  Date Value Ref Range Status  11/15/2020 9.8 8.7 - 10.2 mg/dL Final   Calcium, Total  Date Value Ref Range Status  08/27/2013 8.5 8.5 - 10.1 mg/dL Final          Passed - Vitamin D in normal range and within 360 days     Vitamin D2 1, 25 (OH)2  Date Value Ref Range Status  11/15/2020 16 pg/mL Final    Comment:    This test was developed and its performance characteristics determined by [facility]. It has not been cleared or approved by the Food and Drug Administration.    Vitamin D3 1, 25 (OH)2  Date Value Ref Range Status  11/15/2020 <10 pg/mL Final    Comment:    This test was developed and its performance characteristics determined by [facility]. It has not been cleared or approved by the Food and Drug Administration.    Vitamin D 1, 25 (OH)2 Total  Date Value Ref Range Status  11/15/2020 21 pg/mL Final    Comment:    Reference Range: Adults: 21 - 65    Vit D, 25-Hydroxy  Date Value Ref Range Status  07/11/2020 85 30 - 100 ng/mL Final    Comment:    Vitamin D Status         25-OH Vitamin D: . Deficiency:                    <20 ng/mL Insufficiency:             20 - 29 ng/mL Optimal:                 > or = 30 ng/mL . For 25-OH Vitamin D testing on patients on  D2-supplementation and patients for whom quantitation  of D2 and D3 fractions is required, the QuestAssureD(TM) 25-OH VIT D, (D2,D3), LC/MS/MS is recommended: order  code [phone] (patients >80yrs). See Note 1 . Note 1 . For additional information, please refer to  http://education.[facility].com/faq/FAQ199  (This link is being provided for informational/ educational purposes only.)           Passed - Valid encounter within last 12 months    Recent Outpatient Visits           1 month ago Heel pain, bilateral   [facility] [place], Drue Stager, MD   5 months ago Schizoaffective disorder, bipolar  type [facility])   [facility] Steele Sizer, MD   5 months ago Encounter for examination following treatment at hospital   [place], MD   6 months ago Chronic constipation   [facility] Delsa Grana, [place]   8 months ago  Chronic constipation   [facility] Steele Sizer, MD       Future Appointments             In 2 months Ancil Boozer, Drue Stager, MD [facility], [facility]

## 2021-03-15 ENCOUNTER — Other Ambulatory Visit
Admission: RE | Admit: 2021-03-15 | Discharge: 2021-03-15 | Disposition: A | Discharge status: Home / Self Care | Payer: 59 | Attending: Psychiatry | Admitting: Psychiatry

## 2021-03-15 DIAGNOSIS — F250 Schizoaffective disorder, bipolar type: Secondary | ICD-10-CM | POA: Diagnosis present

## 2021-03-15 DIAGNOSIS — Z79899 Other long term (current) drug therapy: Secondary | ICD-10-CM | POA: Insufficient documentation

## 2021-03-15 LAB — CBC WITH DIFFERENTIAL/PLATELET
Abs Immature Granulocytes: 0.01 10*3/uL (ref 0.00–0.07)
Basophils Absolute: 0.1 10*3/uL (ref 0.0–0.1)
Basophils Relative: 1 %
Eosinophils Absolute: 0.3 10*3/uL (ref 0.0–0.5)
Eosinophils Relative: 4 %
HCT: 38.1 % (ref 36.0–46.0)
Hemoglobin: 12 g/dL (ref 12.0–15.0)
Immature Granulocytes: 0 %
Lymphocytes Relative: 25 %
Lymphs Abs: 1.9 10*3/uL (ref 0.7–4.0)
MCH: 30.5 pg (ref 26.0–34.0)
MCHC: 31.5 g/dL (ref 30.0–36.0)
MCV: 96.7 fL (ref 80.0–100.0)
Monocytes Absolute: 0.5 10*3/uL (ref 0.1–1.0)
Monocytes Relative: 7 %
Neutro Abs: 4.8 10*3/uL (ref 1.7–7.7)
Neutrophils Relative %: 63 %
Platelets: 242 10*3/uL (ref 150–400)
RBC: 3.94 MIL/uL (ref 3.87–5.11)
RDW: 12.8 % (ref 11.5–15.5)
WBC: 7.5 10*3/uL (ref 4.0–10.5)
nRBC: 0 % (ref 0.0–0.2)

## 2021-04-09 ENCOUNTER — Telehealth: Payer: Self-pay | Admitting: Psychiatry

## 2021-04-09 NOTE — Telephone Encounter (Signed)
Pt's husband [name] brought in [employer]. Needing letter. Put in CC box today to approve and reason.

## 2021-04-16 ENCOUNTER — Other Ambulatory Visit: Payer: Self-pay | Admitting: Psychiatry

## 2021-04-16 ENCOUNTER — Other Ambulatory Visit
Admission: RE | Admit: 2021-04-16 | Discharge: 2021-04-16 | Disposition: A | Discharge status: Home / Self Care | Payer: 59 | Attending: Psychiatry | Admitting: Psychiatry

## 2021-04-16 DIAGNOSIS — F250 Schizoaffective disorder, bipolar type: Secondary | ICD-10-CM | POA: Insufficient documentation

## 2021-04-16 DIAGNOSIS — F4001 Agoraphobia with panic disorder: Secondary | ICD-10-CM

## 2021-04-16 DIAGNOSIS — Z79899 Other long term (current) drug therapy: Secondary | ICD-10-CM | POA: Diagnosis present

## 2021-04-16 LAB — CBC WITH DIFFERENTIAL/PLATELET
Abs Immature Granulocytes: 0.02 10*3/uL (ref 0.00–0.07)
Basophils Absolute: 0.1 10*3/uL (ref 0.0–0.1)
Basophils Relative: 1 %
Eosinophils Absolute: 0.3 10*3/uL (ref 0.0–0.5)
Eosinophils Relative: 3 %
HCT: 38 % (ref 36.0–46.0)
Hemoglobin: 12.4 g/dL (ref 12.0–15.0)
Immature Granulocytes: 0 %
Lymphocytes Relative: 26 %
Lymphs Abs: 2.3 10*3/uL (ref 0.7–4.0)
MCH: 31.2 pg (ref 26.0–34.0)
MCHC: 32.6 g/dL (ref 30.0–36.0)
MCV: 95.7 fL (ref 80.0–100.0)
Monocytes Absolute: 0.6 10*3/uL (ref 0.1–1.0)
Monocytes Relative: 7 %
Neutro Abs: 5.5 10*3/uL (ref 1.7–7.7)
Neutrophils Relative %: 63 %
Platelets: 244 10*3/uL (ref 150–400)
RBC: 3.97 MIL/uL (ref 3.87–5.11)
RDW: 12.7 % (ref 11.5–15.5)
WBC: 8.6 10*3/uL (ref 4.0–10.5)
nRBC: 0 % (ref 0.0–0.2)

## 2021-05-14 ENCOUNTER — Ambulatory Visit (INDEPENDENT_AMBULATORY_CARE_PROVIDER_SITE_OTHER): Payer: 59 | Admitting: Family Medicine

## 2021-05-14 ENCOUNTER — Encounter: Payer: Self-pay | Admitting: Family Medicine

## 2021-05-14 ENCOUNTER — Other Ambulatory Visit
Admission: RE | Admit: 2021-05-14 | Discharge: 2021-05-14 | Disposition: A | Discharge status: Home / Self Care | Payer: 59 | Attending: Psychiatry | Admitting: Psychiatry

## 2021-05-14 ENCOUNTER — Other Ambulatory Visit: Payer: Self-pay

## 2021-05-14 VITALS — BP 112/70 | HR 87 | Temp 98.0°F | Resp 16 | Ht 64.0 in | Wt 134.8 lb

## 2021-05-14 DIAGNOSIS — K219 Gastro-esophageal reflux disease without esophagitis: Secondary | ICD-10-CM

## 2021-05-14 DIAGNOSIS — E559 Vitamin D deficiency, unspecified: Secondary | ICD-10-CM | POA: Diagnosis not present

## 2021-05-14 DIAGNOSIS — R000 Tachycardia, unspecified: Secondary | ICD-10-CM

## 2021-05-14 DIAGNOSIS — F250 Schizoaffective disorder, bipolar type: Secondary | ICD-10-CM | POA: Diagnosis present

## 2021-05-14 DIAGNOSIS — R739 Hyperglycemia, unspecified: Secondary | ICD-10-CM

## 2021-05-14 DIAGNOSIS — Z79899 Other long term (current) drug therapy: Secondary | ICD-10-CM | POA: Diagnosis present

## 2021-05-14 DIAGNOSIS — K5909 Other constipation: Secondary | ICD-10-CM

## 2021-05-14 DIAGNOSIS — E781 Pure hyperglyceridemia: Secondary | ICD-10-CM

## 2021-05-14 DIAGNOSIS — Z9181 History of falling: Secondary | ICD-10-CM

## 2021-05-14 LAB — CBC WITH DIFFERENTIAL/PLATELET
Abs Immature Granulocytes: 0.02 10*3/uL (ref 0.00–0.07)
Basophils Absolute: 0.1 10*3/uL (ref 0.0–0.1)
Basophils Relative: 1 %
Eosinophils Absolute: 0.3 10*3/uL (ref 0.0–0.5)
Eosinophils Relative: 3 %
HCT: 36.9 % (ref 36.0–46.0)
Hemoglobin: 11.9 g/dL — ABNORMAL LOW (ref 12.0–15.0)
Immature Granulocytes: 0 %
Lymphocytes Relative: 31 %
Lymphs Abs: 2.9 10*3/uL (ref 0.7–4.0)
MCH: 31.1 pg (ref 26.0–34.0)
MCHC: 32.2 g/dL (ref 30.0–36.0)
MCV: 96.3 fL (ref 80.0–100.0)
Monocytes Absolute: 0.6 10*3/uL (ref 0.1–1.0)
Monocytes Relative: 6 %
Neutro Abs: 5.4 10*3/uL (ref 1.7–7.7)
Neutrophils Relative %: 59 %
Platelets: 238 10*3/uL (ref 150–400)
RBC: 3.83 MIL/uL — ABNORMAL LOW (ref 3.87–5.11)
RDW: 12.7 % (ref 11.5–15.5)
WBC: 9.2 10*3/uL (ref 4.0–10.5)
nRBC: 0 % (ref 0.0–0.2)

## 2021-05-14 MED ORDER — VITAMIN D (ERGOCALCIFEROL) 1.25 MG (50000 UNIT) PO CAPS: 1.0000 | ORAL_CAPSULE | ORAL | 0 refills | Status: DC

## 2021-05-14 NOTE — Progress Notes (Signed)
Name: Carla Thompson   MRN: [id number]    DOB: 23-Aug-1961   Date:05/14/2021       Progress Note  Subjective  Chief Complaint  Follow up   HPI   GERD:  she is now taking Famotidine every day and seems to be helping with symptoms, she noticed no longer having to take anti-acids otc all the time. .   Chronic constipation: she is taking Trulance daily and when she feels constipated takes Lactulose prn.Under the care of Dr. Vicente Males , doing better on current regiment    Schizophrenia: Seeing psychiatrist, Dr. Clovis Pu  . No longer has hallucinations. She has been cooking and cleaning again. Able to have friends over. Husband is here with her and thinks she is back to baseline. No paranoia or hallucinations. She does not drive because of fear or getting in an accident secondary to medication that she needs to take.She is feeling excited about becoming a grandmother soon. [name] is having her first baby in a few weeks.    Tachycardia: doing well on Lopressor half pill twice daily, she denies SOB or chest pain, denies palpitation. Continue medication    Hypertriglyceridemia:   The 10-year ASCVD risk score Mikey Bussing DC Jr., et al., 2013) is: 2.3%   Values used to calculate the score:     Age: 60 years     Sex: Female     Is Non-Hispanic African American: No     Diabetic: No     Tobacco smoker: No     Systolic Blood Pressure: 810 mmHg     Is BP treated: No     HDL Cholesterol: 59 mg/dL     Total Cholesterol: 238 mg/dL   Recent fall: she tripped on her living room rug , initially painful on lateral aspect, but now able to bear weight , she states pain resolved, very mild swelling on left ankle now   Patient Active Problem List   Diagnosis Date Noted   Change in bowel habits 11/23/2020   Hyperlipidemia 11/20/2017   GERD without esophagitis 02/21/2017   Tachycardia 02/20/2016   RLS (restless legs syndrome) 12/21/2015   IBS (irritable bowel syndrome) 12/21/2015   Other long term (current) drug  therapy 12/08/2015   Constipation 12/08/2015   Insomnia, persistent 12/08/2015   Dermatitis, eczematoid 12/08/2015   Gravida 2 para 2 12/08/2015   Hypertriglyceridemia 17/51/0258   Metallic taste 52/77/8242   Female climacteric state 12/08/2015   Schizoaffective disorder, bipolar type ([place]) 12/08/2015    Past Surgical History:  Procedure Laterality Date   ANUS SURGERY  2000   [place]   COLONOSCOPY     COLONOSCOPY WITH PROPOFOL N/A 11/30/2020   Procedure: COLONOSCOPY WITH PROPOFOL;  Surgeon: [name], MD;  Location: [facility] ENDOSCOPY;  Service: Gastroenterology;  Laterality: N/A;   TUBAL LIGATION  12/02/1994    Family History  Problem Relation Age of Onset   Mental illness Sister        Schizophrenia and bipolar   Cancer Maternal Grandmother        Colon   Mental illness Paternal Grandmother        Schizophrenia   Breast cancer Neg Hx     Social History   Tobacco Use   Smoking status: Never   Smokeless tobacco: Never  Substance Use Topics   Alcohol use: No    Alcohol/week: 0.0 standard drinks     Current Outpatient Medications:    Ascorbic Acid (VITAMIN C) 500 MG CHEW, Chew by mouth., Disp: ,  Rfl:    clonazePAM (KLONOPIN) 0.5 MG tablet, TAKE 1 AND 1/2 TABLETS BY MOUTH EVERY MORNING AND EVERY NIGHT AT BEDTIME, Disp: 90 tablet, Rfl: 5   cloZAPine (CLOZARIL) 100 MG tablet, TAKE 1 TABLET BY MOUTH EVERY MORNING, AND 3 EVERY NIGHT AT BEDTIME, Disp: 120 tablet, Rfl: 6   famotidine (PEPCID) 40 MG tablet, Take 1 tablet (40 mg total) by mouth daily., Disp: 90 tablet, Rfl: 1   lactulose (CHRONULAC) 10 GM/15ML solution, Take 30 mLs (20 g total) by mouth 3 (three) times daily., Disp: 2700 mL, Rfl: 2   lithium 300 MG tablet, TAKE 2 TABLETS(600 MG) BY MOUTH AT BEDTIME, Disp: 180 tablet, Rfl: 1   metoprolol tartrate (LOPRESSOR) 25 MG tablet, TAKE 1/2 TABLET(12.5 MG) BY MOUTH TWICE DAILY, Disp: 90 tablet, Rfl: 0   TRULANCE 3 MG TABS, Take 1 tablet by mouth daily., Disp: , Rfl:     Vitamin D, Ergocalciferol, (DRISDOL) 1.25 MG (50000 UNIT) CAPS capsule, Take 1 capsule (50,000 Units total) by mouth once a week., Disp: 12 capsule, Rfl: 0   zinc gluconate 50 MG tablet, Take 50 mg by mouth daily., Disp: , Rfl:    clotrimazole-betamethasone (LOTRISONE) cream, Apply 1 application topically 2 (two) times daily. (Patient not taking: No sig reported), Disp: 45 g, Rfl: 1   docusate sodium (COLACE) 100 MG capsule, Take 100 mg by mouth daily. (Patient not taking: No sig reported), Disp: , Rfl:    polyethylene glycol powder (GLYCOLAX/MIRALAX) 17 GM/SCOOP powder, Take 17 g by mouth 2 (two) times daily as needed (constipation). (Patient not taking: No sig reported), Disp: 578 g, Rfl: 1  Allergies  Allergen Reactions   Levsin [Hyoscyamine Sulfate] Hives   Escitalopram Hives    I personally reviewed active problem list, medication list, allergies, family history, social history, health maintenance with the patient/caregiver today.   ROS  Constitutional: Negative for fever or weight change.  Respiratory: Negative for cough and shortness of breath.   Cardiovascular: Negative for chest pain or palpitations.  Gastrointestinal: Negative for abdominal pain, no bowel changes.  Musculoskeletal: Negative for gait problem or joint swelling.  Skin: Negative for rash.  Neurological: Negative for dizziness or headache.  No other specific complaints in a complete review of systems (except as listed in HPI above).   Objective  Vitals:   05/14/21 1541  BP: 112/70  Pulse: 87  Resp: 16  Temp: 98 F (36.7 C)  TempSrc: Oral  SpO2: 97%  Weight: 134 lb 12.8 oz (61.1 kg)  Height: 5\' 4"  (1.626 m)    Body mass index is 23.14 kg/m.  Physical Exam  Constitutional: Patient appears well-developed and well-nourished. No distress.  HEENT: head atraumatic, normocephalic, pupils equal and reactive to light, neck supple Cardiovascular: Normal rate, regular rhythm and normal heart sounds.  No  murmur heard. No BLE edema. Pulmonary/Chest: Effort normal and breath sounds normal. No respiratory distress. Abdominal: Soft.  There is no tenderness. Psychiatric: Patient has a normal mood and affect. behavior is normal. Judgment and thought content normal.   Recent Results (from the past 2160 hour(s))  CBC with Differential/Platelet     Status: Abnormal   Collection Time: 02/16/21 10:10 AM  Result Value Ref Range   WBC 12.2 (H) 4.0 - 10.5 K/uL   RBC 4.01 3.87 - 5.11 MIL/uL   Hemoglobin 12.1 12.0 - 15.0 g/dL   HCT 39.0 36.0 - 46.0 %   MCV 97.3 80.0 - 100.0 fL   MCH 30.2 26.0 - 34.0  pg   MCHC 31.0 30.0 - 36.0 g/dL   RDW 13.2 11.5 - 15.5 %   Platelets 262 150 - 400 K/uL   nRBC 0.0 0.0 - 0.2 %   Neutrophils Relative % 75 %   Neutro Abs 9.1 (H) 1.7 - 7.7 K/uL   Lymphocytes Relative 16 %   Lymphs Abs 1.9 0.7 - 4.0 K/uL   Monocytes Relative 7 %   Monocytes Absolute 0.9 0.1 - 1.0 K/uL   Eosinophils Relative 2 %   Eosinophils Absolute 0.2 0.0 - 0.5 K/uL   Basophils Relative 0 %   Basophils Absolute 0.0 0.0 - 0.1 K/uL   Immature Granulocytes 0 %   Abs Immature Granulocytes 0.04 0.00 - 0.07 K/uL    Comment: Performed at [facility], [address] [place], [place] [address]  CBC with Differential/Platelet     Status: None   Collection Time: 03/15/21 12:01 PM  Result Value Ref Range   WBC 7.5 4.0 - 10.5 K/uL   RBC 3.94 3.87 - 5.11 MIL/uL   Hemoglobin 12.0 12.0 - 15.0 g/dL   HCT 38.1 36.0 - 46.0 %   MCV 96.7 80.0 - 100.0 fL   MCH 30.5 26.0 - 34.0 pg   MCHC 31.5 30.0 - 36.0 g/dL   RDW 12.8 11.5 - 15.5 %   Platelets 242 150 - 400 K/uL   nRBC 0.0 0.0 - 0.2 %   Neutrophils Relative % 63 %   Neutro Abs 4.8 1.7 - 7.7 K/uL   Lymphocytes Relative 25 %   Lymphs Abs 1.9 0.7 - 4.0 K/uL   Monocytes Relative 7 %   Monocytes Absolute 0.5 0.1 - 1.0 K/uL   Eosinophils Relative 4 %   Eosinophils Absolute 0.3 0.0 - 0.5 K/uL   Basophils Relative 1 %   Basophils Absolute 0.1 0.0 -  0.1 K/uL   Immature Granulocytes 0 %   Abs Immature Granulocytes 0.01 0.00 - 0.07 K/uL    Comment: Performed at [facility], [address] [place], [place] [address]  CBC with Differential/Platelet     Status: None   Collection Time: 04/16/21 11:44 AM  Result Value Ref Range   WBC 8.6 4.0 - 10.5 K/uL   RBC 3.97 3.87 - 5.11 MIL/uL   Hemoglobin 12.4 12.0 - 15.0 g/dL   HCT 38.0 36.0 - 46.0 %   MCV 95.7 80.0 - 100.0 fL   MCH 31.2 26.0 - 34.0 pg   MCHC 32.6 30.0 - 36.0 g/dL   RDW 12.7 11.5 - 15.5 %   Platelets 244 150 - 400 K/uL   nRBC 0.0 0.0 - 0.2 %   Neutrophils Relative % 63 %   Neutro Abs 5.5 1.7 - 7.7 K/uL   Lymphocytes Relative 26 %   Lymphs Abs 2.3 0.7 - 4.0 K/uL   Monocytes Relative 7 %   Monocytes Absolute 0.6 0.1 - 1.0 K/uL   Eosinophils Relative 3 %   Eosinophils Absolute 0.3 0.0 - 0.5 K/uL   Basophils Relative 1 %   Basophils Absolute 0.1 0.0 - 0.1 K/uL   Immature Granulocytes 0 %   Abs Immature Granulocytes 0.02 0.00 - 0.07 K/uL    Comment: Performed at [facility], [address] [place], [place] [address]     PHQ2/9: Depression screen [facility] 2/9 05/14/2021 02/02/2021 10/04/2020 09/28/2020 09/04/2020  Decreased Interest 1 0 3 3 2   Down, Depressed, Hopeless 0 0 3 3 1   PHQ - 2 Score 1  0 6 6 3   Altered sleeping 1 0 3 - -  Tired, decreased energy 1 3 3  - -  Change in appetite 0 0 3 - -  Feeling bad or failure about yourself  0 0 2 - -  Trouble concentrating 2 1 3  - -  Moving slowly or fidgety/restless 3 0 2 - -  Suicidal thoughts 0 0 0 - -  PHQ-9 Score 8 4 22  - -  Difficult doing work/chores Somewhat difficult - Very difficult - -  Some recent data might be hidden    phq 9 is positive   Fall Risk: Fall Risk  05/14/2021 02/02/2021 10/04/2020 09/28/2020 09/04/2020  Falls in the past year? 1 1 1 1 1   Comment - - - - -  Number falls in past yr: 1 0 1 1 1   Comment - - - - -  Injury with Fall? 0 0 0 0 0  Comment - - - - -  Risk for fall due to :  History of fall(s) - History of fall(s) - -  Follow up Falls prevention discussed - - - Falls evaluation completed     Functional Status Survey: Is the patient deaf or have difficulty hearing?: No Does the patient have difficulty seeing, even when wearing glasses/contacts?: No Does the patient have difficulty concentrating, remembering, or making decisions?: Yes Does the patient have difficulty walking or climbing stairs?: Yes Does the patient have difficulty dressing or bathing?: No Does the patient have difficulty doing errands alone such as visiting a doctor's office or shopping?: Yes    Assessment & Plan   1. Schizoaffective disorder, bipolar type [facility])  Keep follow up with psychiatrist   2. GERD without esophagitis   3. Vitamin D deficiency  - Vitamin D, Ergocalciferol, (DRISDOL) 1.25 MG (50000 UNIT) CAPS capsule; Take 1 capsule (50,000 Units total) by mouth once a week.  Dispense: 12 capsule; Refill: 0  4. Hyperglycemia   5. Hypertriglyceridemia  Recheck level yearly   6. Chronic constipation  Doing better   7. History of recent fall   8. Tachycardia

## 2021-05-15 ENCOUNTER — Other Ambulatory Visit: Payer: Self-pay | Admitting: Family Medicine

## 2021-05-15 DIAGNOSIS — R000 Tachycardia, unspecified: Secondary | ICD-10-CM

## 2021-06-18 ENCOUNTER — Other Ambulatory Visit
Admission: RE | Admit: 2021-06-18 | Discharge: 2021-06-18 | Disposition: A | Discharge status: Home / Self Care | Payer: 59 | Attending: Psychiatry | Admitting: Psychiatry

## 2021-06-18 DIAGNOSIS — F250 Schizoaffective disorder, bipolar type: Secondary | ICD-10-CM | POA: Diagnosis present

## 2021-06-18 DIAGNOSIS — Z79899 Other long term (current) drug therapy: Secondary | ICD-10-CM | POA: Diagnosis present

## 2021-06-18 LAB — CBC WITH DIFFERENTIAL/PLATELET
Abs Immature Granulocytes: 0.02 10*3/uL (ref 0.00–0.07)
Basophils Absolute: 0.1 10*3/uL (ref 0.0–0.1)
Basophils Relative: 1 %
Eosinophils Absolute: 0.1 10*3/uL (ref 0.0–0.5)
Eosinophils Relative: 2 %
HCT: 38.3 % (ref 36.0–46.0)
Hemoglobin: 12.4 g/dL (ref 12.0–15.0)
Immature Granulocytes: 0 %
Lymphocytes Relative: 20 %
Lymphs Abs: 1.6 10*3/uL (ref 0.7–4.0)
MCH: 31.2 pg (ref 26.0–34.0)
MCHC: 32.4 g/dL (ref 30.0–36.0)
MCV: 96.2 fL (ref 80.0–100.0)
Monocytes Absolute: 0.6 10*3/uL (ref 0.1–1.0)
Monocytes Relative: 7 %
Neutro Abs: 5.7 10*3/uL (ref 1.7–7.7)
Neutrophils Relative %: 70 %
Platelets: 225 10*3/uL (ref 150–400)
RBC: 3.98 MIL/uL (ref 3.87–5.11)
RDW: 12.2 % (ref 11.5–15.5)
WBC: 8.1 10*3/uL (ref 4.0–10.5)
nRBC: 0 % (ref 0.0–0.2)

## 2021-06-20 ENCOUNTER — Telehealth: Payer: Self-pay | Admitting: Psychiatry

## 2021-06-20 ENCOUNTER — Other Ambulatory Visit: Payer: Self-pay

## 2021-06-20 DIAGNOSIS — F250 Schizoaffective disorder, bipolar type: Secondary | ICD-10-CM

## 2021-06-20 MED ORDER — CLOZAPINE 100 MG PO TABS: ORAL_TABLET | ORAL | 6 refills | Status: DC

## 2021-06-20 NOTE — Telephone Encounter (Signed)
I believe this is referring to her Clozapine. Her labs have been updated on 7/19 to [facility] Clozapine. Rx sent

## 2021-06-20 NOTE — Telephone Encounter (Signed)
Patient lm requesting a refill on the Clonazepam. Last office visit was 02/20/21.

## 2021-06-20 NOTE — Telephone Encounter (Signed)
Informational Only. [facility] stated that the RX could not be filled today because they got a rejection saying " PA needed". Staff called [employer] and checked to see if PA needed and it is not needed. Test claim by insur. ran thru correctly for today. Staff called [facility] back and were told that they ran it again and were able to fill it this time.

## 2021-06-21 NOTE — Telephone Encounter (Signed)
Noted to have issues with her pharmacy on a consistent basis. Labs had been updated on 7/19 as received.

## 2021-06-21 NOTE — Telephone Encounter (Signed)
Pt is getting clozapine from [place] and other meds in [place] regularly makes errors with clozapine.

## 2021-06-25 ENCOUNTER — Ambulatory Visit: Payer: 59 | Admitting: Psychiatry

## 2021-06-25 ENCOUNTER — Other Ambulatory Visit: Payer: Self-pay | Admitting: Family Medicine

## 2021-06-25 DIAGNOSIS — K219 Gastro-esophageal reflux disease without esophagitis: Secondary | ICD-10-CM

## 2021-06-25 NOTE — Telephone Encounter (Signed)
Requested Prescriptions  Pending Prescriptions Disp Refills  . famotidine (PEPCID) 40 MG tablet [Pharmacy Med Name: FAMOTIDINE '40MG'$  TABLETS] 90 tablet 1    Sig: TAKE 1 TABLET(40 MG) BY MOUTH DAILY     Gastroenterology:  H2 Antagonists Passed - 06/25/2021  6:15 PM      Passed - Valid encounter within last 12 months    Recent Outpatient Visits          1 month ago Schizoaffective disorder, bipolar type [facility])   [facility] Steele Sizer, MD   4 months ago Heel pain, bilateral   [facility] [place], Drue Stager, MD   8 months ago Schizoaffective disorder, bipolar type [facility])   [facility] Steele Sizer, MD   9 months ago Encounter for examination following treatment at hospital   [place], MD   9 months ago Chronic constipation   [facility] Delsa Grana, [place]      Future Appointments            In 4 months Steele Sizer, MD [facility], [facility]

## 2021-07-14 ENCOUNTER — Other Ambulatory Visit: Payer: Self-pay | Admitting: Psychiatry

## 2021-07-14 DIAGNOSIS — F250 Schizoaffective disorder, bipolar type: Secondary | ICD-10-CM

## 2021-07-16 ENCOUNTER — Other Ambulatory Visit: Payer: Self-pay | Admitting: Psychiatry

## 2021-07-16 ENCOUNTER — Other Ambulatory Visit
Admission: RE | Admit: 2021-07-16 | Discharge: 2021-07-16 | Disposition: A | Discharge status: Home / Self Care | Payer: 59 | Attending: Psychiatry | Admitting: Psychiatry

## 2021-07-16 DIAGNOSIS — Z79899 Other long term (current) drug therapy: Secondary | ICD-10-CM

## 2021-07-16 DIAGNOSIS — F250 Schizoaffective disorder, bipolar type: Secondary | ICD-10-CM

## 2021-07-16 LAB — CBC WITH DIFFERENTIAL/PLATELET
Abs Immature Granulocytes: 0.01 10*3/uL (ref 0.00–0.07)
Basophils Absolute: 0.1 10*3/uL (ref 0.0–0.1)
Basophils Relative: 1 %
Eosinophils Absolute: 0.3 10*3/uL (ref 0.0–0.5)
Eosinophils Relative: 3 %
HCT: 38.3 % (ref 36.0–46.0)
Hemoglobin: 12.5 g/dL (ref 12.0–15.0)
Immature Granulocytes: 0 %
Lymphocytes Relative: 26 %
Lymphs Abs: 2.1 10*3/uL (ref 0.7–4.0)
MCH: 32.1 pg (ref 26.0–34.0)
MCHC: 32.6 g/dL (ref 30.0–36.0)
MCV: 98.2 fL (ref 80.0–100.0)
Monocytes Absolute: 0.5 10*3/uL (ref 0.1–1.0)
Monocytes Relative: 7 %
Neutro Abs: 4.9 10*3/uL (ref 1.7–7.7)
Neutrophils Relative %: 63 %
Platelets: 238 10*3/uL (ref 150–400)
RBC: 3.9 MIL/uL (ref 3.87–5.11)
RDW: 12.5 % (ref 11.5–15.5)
WBC: 7.9 10*3/uL (ref 4.0–10.5)
nRBC: 0 % (ref 0.0–0.2)

## 2021-07-16 NOTE — Telephone Encounter (Signed)
Pt needs to get lithium level with next blood draw for clozapine.  Order sent

## 2021-07-26 ENCOUNTER — Ambulatory Visit (INDEPENDENT_AMBULATORY_CARE_PROVIDER_SITE_OTHER): Payer: 59 | Admitting: Psychiatry

## 2021-07-26 ENCOUNTER — Other Ambulatory Visit: Payer: Self-pay

## 2021-07-26 ENCOUNTER — Encounter: Payer: Self-pay | Admitting: Psychiatry

## 2021-07-26 DIAGNOSIS — Z79899 Other long term (current) drug therapy: Secondary | ICD-10-CM

## 2021-07-26 DIAGNOSIS — F250 Schizoaffective disorder, bipolar type: Secondary | ICD-10-CM | POA: Diagnosis not present

## 2021-07-26 DIAGNOSIS — F4001 Agoraphobia with panic disorder: Secondary | ICD-10-CM | POA: Diagnosis not present

## 2021-07-26 DIAGNOSIS — K5909 Other constipation: Secondary | ICD-10-CM

## 2021-07-26 MED ORDER — LITHIUM CARBONATE 300 MG PO TABS: ORAL_TABLET | ORAL | 1 refills | Status: DC

## 2021-07-26 NOTE — Progress Notes (Signed)
Carla Thompson:[id number] [birth year] 60 y.o.  Subjective:   Patient ID:  Carla Thompson is a 60 y.o. (DOB [birth year]) female.  Chief Complaint:  Chief Complaint  Patient presents with   Follow-up   Anxiety    HPI Carla Thompson presents to the office today for follow-up of schizoaffective disorder.  visit October, 2020.  No med changes.  She continued on clozapine 400, lithium 600, and clonazepam 0.5 twice daily as needed.   03/09/20 appt noted: StepF had been sick with Covid and other problems.  Died.  Other family members with Covid problems too and died. Carla Thompson worked from home October 28, 2023 to April  Good overall without complaints.  Stress dealing with Covid.  Trying to stay active including cooking.  No major episodes of confusion nor voices.    No fear.  Does emails on the computer.  Not much anxiety lately.  Sleep same 9-9.  No fear episodes.  No manic nor depressive episodes.  No significant panic attacks recently.  She is not avoiding things out of anxiety generally.  She's still active at church.   Large infrequent BM clog toilet and intermittently not well managed.  Plan: Still problems with large infrequent q 4-7 days, large BM.  Often clogs the toilet.  Not well managed. Increase Linzess trial to 290 mg daily to see if it's better managed.  09/07/20 appt with following noted: She has notes. Balance problems, movements in feet legs, stomach.  Marching movements in feet.  Scares her. 1 fall on stairs going up. Wonders about reducing clozapine to 3 instead of 4 bc of tiredness. B researched lithium and said lithium and clozapine are "bad medicines with a lot of SE".   she's worried about SE. Disc ran out of clozapine for 2 weeks and decompensated.  back on it for almost a month.  Pharmacy was saying they couldn't dispense clozapine without labs and they didn't have the labs.  H says she's not back to normal yet bc can't focus and make simple decisions.  For example couldn't  decide whether to drink water out of the faucet or out of Brita water filter.  Lost 10# and not eating well.  Poor appetite.   Severely constipated chronically.  Using miralax.  Plan: continue clozapine  11/09/20 appt with following noted:  Seen with Carla Thompson CO problems with chronic constipation and using Miralax twice daily.  It is helpful but varies.  Doesn't like clozapine for this reason.  Usual trouble is leakage from the stool.  Disc sphincter exercises.   Lost weight. Some depression over the TD including feet moving up and down.  Someitmes stomach moves.  Occ tremor but not lately.  Trouble with sleep, initial and middle. Some of the problems with getting clozapine may have been related to not getting th labs to the correct [facility] in [place].  Doesn't like prunes or prune juice..  Not attending church due to having to wear depends and "tardive dyskinesia".   Plan: Can switch to clonazepam 0.5 mg in am and 1.0 mg HS  02/20/2021 appointment with the following noted: Walks outside.   Carla Thompson got Covid for 3 weeks.  She got milder sx with assumption she had it also. No problems getting clozapine since here.  Have worked on getting it more consistently and that seems to be working.    Better than 6 mos ago.  Stronger physically and mentally.  No sig panic attacks now. Balance is better.  Weight loss  stopped.  Not depressed.   Better with more consistency with clozapine. Exercising more and that's helped.  Plan: Continue clozapine 400 mg HS  (She prefers all at once at night) Contnue lithium 600 mg HS Panic disorder controlled. clonazepam 0.75 mg BID   07/26/2021 appointment with the following noted: seen with Carla Thompson Hard time swallowing pills.  Disc jury duty.  She doesn't feel capable.  Doesn't drive.   Anxious about taking pills but otherwise is usually doing ok.  Tryitng to walk 6 times weekly and physically feels better.   Patient reports stable mood and denies depressed or irritable  moods.  Patient denies any recent difficulty with anxiety.  Patient denies difficulty with sleep initiation or maintenance. Denies appetite disturbance.  Patient reports that energy and motivation have been good.  Patient with chronic difficulty with concentration.  Patient denies any suicidal ideation. No problems getting clozapine lately.  Oldest daughter married March 2020  On clozapine for many years with benefit.  Several psych hospitalizations.  She is failed multiple other psychiatric medications which is why she is on clozapine.  She is also failed or relapsed with dosage reduction of clozapine or if she missed clozapine.  She is consistent with her dosage now.  Past Psychiatric Medication Trials: Clozapine 400 Clonazepam Lithium 600 Citrucil, Miralax, stool softener, Mg tablets  Sister has schizophrenia.  Review of Systems:  Review of Systems  Cardiovascular:  Negative for palpitations.  Gastrointestinal:  Positive for abdominal distention, constipation and diarrhea. Negative for abdominal pain.  Genitourinary:  Positive for enuresis.  Musculoskeletal:  Positive for neck stiffness.  Neurological:  Negative for tremors.  Psychiatric/Behavioral:  Positive for decreased concentration, dysphoric mood and sleep disturbance. Negative for agitation, behavioral problems, confusion, hallucinations, self-injury and suicidal ideas. The patient is not nervous/anxious and is not hyperactive.  Constipation comes and goes  Medications: I have reviewed the patient's current medications.  Current Outpatient Medications  Medication Sig Dispense Refill   Ascorbic Acid (VITAMIN C) 500 MG CHEW Chew by mouth.     clonazePAM (KLONOPIN) 0.5 MG tablet TAKE 1 AND 1/2 TABLETS BY MOUTH EVERY MORNING AND EVERY NIGHT AT BEDTIME 90 tablet 5   cloZAPine (CLOZARIL) 100 MG tablet TAKE 1 TABLET BY MOUTH EVERY MORNING, AND 3 EVERY NIGHT AT BEDTIME 120 tablet 6   famotidine (PEPCID) 40 MG tablet TAKE 1 TABLET(40  MG) BY MOUTH DAILY 90 tablet 1   metoprolol tartrate (LOPRESSOR) 25 MG tablet TAKE 1/2 TABLET(12.5 MG) BY MOUTH TWICE DAILY 90 tablet 1   TRULANCE 3 MG TABS Take 1 tablet by mouth daily.     Vitamin D, Ergocalciferol, (DRISDOL) 1.25 MG (50000 UNIT) CAPS capsule Take 1 capsule (50,000 Units total) by mouth once a week. 12 capsule 0   zinc gluconate 50 MG tablet Take 50 mg by mouth daily.     lithium 300 MG tablet TAKE 2 TABLETS(600 MG) BY MOUTH AT BEDTIME 180 tablet 1   No current facility-administered medications for this visit.    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 Tyrone Sage    Family History  Problem Relation Age of Onset   Mental illness Sister        Schizophrenia and bipolar   Cancer Maternal Grandmother        Colon   Mental illness Paternal Grandmother        Schizophrenia   Breast cancer Neg Hx     Social History   Socioeconomic History   Marital status: Married    Spouse name: [name]   Number of children: 2   Years of education: Not on file   Highest education level: Bachelor's degree (e.g., BA, AB, BS)  Occupational History   Not on file  Tobacco Use   Smoking status: Never   Smokeless tobacco: Never  Vaping Use   Vaping Use: Never used  Substance and Sexual Activity   Alcohol use: No    Alcohol/week: 0.0 standard drinks   Drug use: No   Sexual activity: Not Currently    Partners: Male  Other Topics  Concern   Not on file  Social History Narrative   Not on file   Social Determinants of Health   Financial Resource Strain: Not on file  Food Insecurity: Not on file  Transportation Needs: Not on file  Physical Activity: Not on file  Stress: Not on file  Social Connections: Not on file  Intimate Partner Violence: Not on file    Past Medical History, Surgical history, Social history, and Family history were reviewed and updated as appropriate.   Please see review of systems for further details on the patient's review from today.   Objective:   Physical Exam:  LMP 12/16/2016 (Approximate)   Physical Exam Constitutional:      General: She is not in acute distress.    Appearance: She is well-developed.  Musculoskeletal:        General: No deformity.  Neurological:     Mental Status: She is alert and oriented to person, place, and time.     Cranial Nerves: No dysarthria.     Coordination: Coordination normal.  Psychiatric:        Attention and Perception: Perception normal. She is inattentive. She does not perceive auditory or visual hallucinations.        Mood and Affect: Mood is anxious. Mood is not depressed. Affect is not labile, blunt, angry, tearful or inappropriate.        Speech: Speech is tangential. Speech is not rapid and pressured or slurred.        Behavior: Behavior normal. Behavior is cooperative.        Thought Content: Thought content is paranoid. Thought content is not delusional. Thought content does not include homicidal or suicidal ideation. Thought content does not include homicidal or suicidal plan.        Cognition and Memory: Cognition is impaired. She exhibits impaired recent memory.     Comments: Insight & judgment still fair. Scattered thought chronically but less hyperverbal and overinclusiveness.  Not as fidgety as last time.  Affect more relaxed. Less irritable Chronic stooped posture.      Lab Review:     Component Value Date/Time   NA  139 09/11/2020 2000   NA 140 01/18/2016 1418   NA 139 08/27/2013 0930   K 3.8 09/11/2020 2000   K 4.2 08/27/2013 0930   CL 102 09/11/2020  2000   CL 108 (H) 08/27/2013 0930   CO2 28 09/11/2020 2000   CO2 28 08/27/2013 0930   GLUCOSE 112 (H) 09/11/2020 2000   GLUCOSE 98 08/27/2013 0930   BUN 11 09/11/2020 2000   BUN 17 01/18/2016 1418   BUN 11 08/27/2013 0930   CREATININE 1.00 09/11/2020 2000   CREATININE 0.99 07/11/2020 1558   CALCIUM 9.8 11/15/2020 0951   CALCIUM 8.5 08/27/2013 0930   PROT 7.6 09/11/2020 2000   PROT 6.6 01/18/2016 1418   PROT 7.2 08/27/2013 0930   ALBUMIN 4.3 09/11/2020 2000   ALBUMIN 4.4 01/18/2016 1418   ALBUMIN 3.5 08/27/2013 0930   AST 15 09/11/2020 2000   AST 20 08/27/2013 0930   ALT 14 09/11/2020 2000   ALT 19 08/27/2013 0930   ALKPHOS 95 09/11/2020 2000   ALKPHOS 107 08/27/2013 0930   BILITOT 0.5 09/11/2020 2000   BILITOT <0.2 01/18/2016 1418   BILITOT 0.3 08/27/2013 0930   GFRNONAA >60 09/11/2020 2000   GFRNONAA 63 07/11/2020 1558   GFRAA >60 08/31/2020 1633   GFRAA 73 07/11/2020 1558       Component Value Date/Time   WBC 7.9 07/16/2021 1152   RBC 3.90 07/16/2021 1152   HGB 12.5 07/16/2021 1152   HGB 12.3 01/23/2016 1231   HCT 38.3 07/16/2021 1152   HCT 38.6 01/23/2016 1231   PLT 238 07/16/2021 1152   PLT 280 01/23/2016 1231   MCV 98.2 07/16/2021 1152   MCV 95 01/23/2016 1231   MCV 92 03/24/2015 1244   MCH 32.1 07/16/2021 1152   MCHC 32.6 07/16/2021 1152   RDW 12.5 07/16/2021 1152   RDW 13.7 01/23/2016 1231   RDW 13.1 03/24/2015 1244   LYMPHSABS 2.1 07/16/2021 1152   LYMPHSABS 2.4 01/23/2016 1231   LYMPHSABS 2.2 03/24/2015 1244   MONOABS 0.5 07/16/2021 1152   MONOABS 0.4 03/24/2015 1244   EOSABS 0.3 07/16/2021 1152   EOSABS 0.1 01/23/2016 1231   EOSABS 0.4 03/24/2015 1244   BASOSABS 0.1 07/16/2021 1152   BASOSABS 0.0 01/23/2016 1231   BASOSABS 0.0 03/24/2015 1244    Lithium Lvl  Date Value Ref Range Status  09/12/2020  0.46 (L) 0.60 - 1.20 mmol/L Final    Comment:    Performed at [facility], [address] [place], [place] [address]     Lab Results  Component Value Date   VALPROATE 102 (H) 12/26/2015     .res Assessment: Plan:    Schizoaffective disorder, bipolar type ([place]) - Plan: lithium 300 MG tablet, Basic metabolic panel, TSH, Lithium level  Panic disorder with agoraphobia  Chronic constipation  Lithium use - Plan: Basic metabolic panel, TSH, Lithium level  Long term current use of clozapine   Overall her schizoaffective disorder was under control.  Until she ran out of clozapine bc of difficulty getting it last summer/fall 2021.  This led to more disorganziation, confusion, anxiety and rumination.  Insight is not good.  Not self aware about overinclusiveness. All of this is better now with better clozapine consistency and exercise.  Discussed the importance of consistent with safety with the clozapine.  If they have difficulty obtaining it in the future please contact us right away.  She missed an off of the clozapine to cause disorganization and psychotic symptoms as well as anxiety.  It would take a while for that to resolve.  Answered questions about clozapine and alternatives. Continue clozapine 400 mg HS  (She prefers all at once at night)  Panic disorder controlled. clonazepam 0.75 mg BID  No med changes today  Lithium level ordered 0.46 low normal at last check. Contnue lithium 600 mg HS  Counseled patient regarding potential benefits, risks, and side effects of lithium to include potential risk of lithium affecting thyroid and renal function.  Discussed need for periodic lab monitoring to determine drug level and to assess for potential adverse effects.  Counseled patient regarding signs and symptoms of lithium toxicity and advised that they notify office immediately or seek urgent medical attention if experiencing these signs and symptoms.  Patient advised to contact  office with any questions or concerns.  ANC stable the last 6 months. Continue clozapine.   Continue lab test per usual.    GI managing constipation better.  Exercise helps constipation. Answered questions about probiotics.  This appt was 30 mins.  FU 8 weeks  Lynder Parents, MD, DFAPA   Please see After Visit Summary for patient specific instructions.  Future Appointments  Date Time Provider [place]  11/09/2021  3:40 PM Steele Sizer, MD [facility]    Orders Placed This Encounter  Procedures   Basic metabolic panel   TSH   Lithium level       -------------------------------

## 2021-08-13 ENCOUNTER — Other Ambulatory Visit
Admission: RE | Admit: 2021-08-13 | Discharge: 2021-08-13 | Disposition: A | Discharge status: Home / Self Care | Payer: 59 | Attending: Psychiatry | Admitting: Psychiatry

## 2021-08-13 DIAGNOSIS — F250 Schizoaffective disorder, bipolar type: Secondary | ICD-10-CM | POA: Insufficient documentation

## 2021-08-13 DIAGNOSIS — Z79899 Other long term (current) drug therapy: Secondary | ICD-10-CM | POA: Insufficient documentation

## 2021-08-13 LAB — CBC WITH DIFFERENTIAL/PLATELET
Abs Immature Granulocytes: 0.03 10*3/uL (ref 0.00–0.07)
Basophils Absolute: 0.1 10*3/uL (ref 0.0–0.1)
Basophils Relative: 1 %
Eosinophils Absolute: 0.2 10*3/uL (ref 0.0–0.5)
Eosinophils Relative: 2 %
HCT: 38.3 % (ref 36.0–46.0)
Hemoglobin: 12.1 g/dL (ref 12.0–15.0)
Immature Granulocytes: 0 %
Lymphocytes Relative: 24 %
Lymphs Abs: 2.1 10*3/uL (ref 0.7–4.0)
MCH: 30.6 pg (ref 26.0–34.0)
MCHC: 31.6 g/dL (ref 30.0–36.0)
MCV: 97 fL (ref 80.0–100.0)
Monocytes Absolute: 0.6 10*3/uL (ref 0.1–1.0)
Monocytes Relative: 7 %
Neutro Abs: 5.8 10*3/uL (ref 1.7–7.7)
Neutrophils Relative %: 66 %
Platelets: 234 10*3/uL (ref 150–400)
RBC: 3.95 MIL/uL (ref 3.87–5.11)
RDW: 12.1 % (ref 11.5–15.5)
WBC: 8.7 10*3/uL (ref 4.0–10.5)
nRBC: 0 % (ref 0.0–0.2)

## 2021-08-25 ENCOUNTER — Other Ambulatory Visit: Payer: Self-pay | Admitting: Gastroenterology

## 2021-09-03 ENCOUNTER — Ambulatory Visit: Payer: 59 | Admitting: Psychiatry

## 2021-09-09 ENCOUNTER — Other Ambulatory Visit: Payer: Self-pay | Admitting: Family Medicine

## 2021-09-09 DIAGNOSIS — E559 Vitamin D deficiency, unspecified: Secondary | ICD-10-CM

## 2021-09-09 NOTE — Telephone Encounter (Signed)
Requested medication (s) are due for refill today: yes  Requested medication (s) are on the active medication list: yes  Last refill:  05/14/21 #12  Future visit scheduled: yes  Notes to clinic:  no results for phosphate - and med not delegated to NT to RF   Requested Prescriptions  Pending Prescriptions Disp Refills   Vitamin D, Ergocalciferol, (DRISDOL) 1.25 MG (50000 UNIT) CAPS capsule [Pharmacy Med Name: VITAMIN D2 50,000IU (ERGO) CAP RX] 12 capsule 0    Sig: TAKE 1 CAPSULE BY MOUTH 1 TIME A WEEK     Endocrinology:  Vitamins - Vitamin D Supplementation Failed - 09/09/2021 12:29 PM      Failed - 50,000 IU strengths are not delegated      Failed - Phosphate in normal range and within 360 days    No results found for: PHOS        Passed - Ca in normal range and within 360 days    Calcium  Date Value Ref Range Status  11/15/2020 9.8 8.7 - 10.2 mg/dL Final   Calcium, Total  Date Value Ref Range Status  08/27/2013 8.5 8.5 - 10.1 mg/dL Final          Passed - Vitamin D in normal range and within 360 days    Vitamin D2 1, 25 (OH)2  Date Value Ref Range Status  11/15/2020 16 pg/mL Final    Comment:    This test was developed and its performance characteristics determined by [facility]. It has not been cleared or approved by the Food and Drug Administration.    Vitamin D3 1, 25 (OH)2  Date Value Ref Range Status  11/15/2020 <10 pg/mL Final    Comment:    This test was developed and its performance characteristics determined by [facility]. It has not been cleared or approved by the Food and Drug Administration.    Vitamin D 1, 25 (OH)2 Total  Date Value Ref Range Status  11/15/2020 21 pg/mL Final    Comment:    Reference Range: Adults: 21 - 65    Vit D, 25-Hydroxy  Date Value Ref Range Status  07/11/2020 85 30 - 100 ng/mL Final    Comment:    Vitamin D Status         25-OH Vitamin D: . Deficiency:                    <20 ng/mL Insufficiency:             20 - 29  ng/mL Optimal:                 > or = 30 ng/mL . For 25-OH Vitamin D testing on patients on  D2-supplementation and patients for whom quantitation  of D2 and D3 fractions is required, the [facility](TM) 25-OH VIT D, (D2,D3), LC/MS/MS is recommended: order  code [id number] (patients >75yrs). See Note 1 . Note 1 . For additional information, please refer to  http://education.[facility].com/faq/FAQ199  (This link is being provided for informational/ educational purposes only.)           Passed - Valid encounter within last 12 months    Recent Outpatient Visits           3 months ago Schizoaffective disorder, bipolar type [facility])   [facility] Steele Sizer, MD   7 months ago Heel pain, bilateral   [facility] [place], Drue Stager, MD   11 months ago Schizoaffective disorder, bipolar type ([place])  [facility] Steele Sizer, MD   11 months ago Encounter for examination following treatment at hospital   [facility] Towanda Malkin, MD   1 year ago Chronic constipation   [facility] Delsa Grana, [place]       Future Appointments             In 2 months Steele Sizer, MD [facility], [facility]

## 2021-09-10 ENCOUNTER — Other Ambulatory Visit
Admission: RE | Admit: 2021-09-10 | Discharge: 2021-09-10 | Disposition: A | Discharge status: Home / Self Care | Payer: 59 | Attending: Psychiatry | Admitting: Psychiatry

## 2021-09-10 ENCOUNTER — Other Ambulatory Visit: Payer: Self-pay | Admitting: Family Medicine

## 2021-09-10 DIAGNOSIS — F250 Schizoaffective disorder, bipolar type: Secondary | ICD-10-CM | POA: Diagnosis present

## 2021-09-10 DIAGNOSIS — Z79899 Other long term (current) drug therapy: Secondary | ICD-10-CM | POA: Diagnosis not present

## 2021-09-10 DIAGNOSIS — Z1231 Encounter for screening mammogram for malignant neoplasm of breast: Secondary | ICD-10-CM

## 2021-09-10 LAB — CBC WITH DIFFERENTIAL/PLATELET
Abs Immature Granulocytes: 0.03 10*3/uL (ref 0.00–0.07)
Basophils Absolute: 0 10*3/uL (ref 0.0–0.1)
Basophils Relative: 1 %
Eosinophils Absolute: 0.3 10*3/uL (ref 0.0–0.5)
Eosinophils Relative: 3 %
HCT: 39.1 % (ref 36.0–46.0)
Hemoglobin: 12.7 g/dL (ref 12.0–15.0)
Immature Granulocytes: 0 %
Lymphocytes Relative: 28 %
Lymphs Abs: 2.3 10*3/uL (ref 0.7–4.0)
MCH: 31.3 pg (ref 26.0–34.0)
MCHC: 32.5 g/dL (ref 30.0–36.0)
MCV: 96.3 fL (ref 80.0–100.0)
Monocytes Absolute: 0.5 10*3/uL (ref 0.1–1.0)
Monocytes Relative: 6 %
Neutro Abs: 5.2 10*3/uL (ref 1.7–7.7)
Neutrophils Relative %: 62 %
Platelets: 251 10*3/uL (ref 150–400)
RBC: 4.06 MIL/uL (ref 3.87–5.11)
RDW: 12.1 % (ref 11.5–15.5)
WBC: 8.3 10*3/uL (ref 4.0–10.5)
nRBC: 0 % (ref 0.0–0.2)

## 2021-09-27 ENCOUNTER — Ambulatory Visit
Admission: RE | Admit: 2021-09-27 | Discharge: 2021-09-27 | Disposition: A | Discharge status: Home / Self Care | Payer: 59 | Source: Ambulatory Visit | Attending: Family Medicine | Admitting: Family Medicine

## 2021-09-27 ENCOUNTER — Other Ambulatory Visit: Payer: Self-pay

## 2021-09-27 DIAGNOSIS — Z1231 Encounter for screening mammogram for malignant neoplasm of breast: Secondary | ICD-10-CM | POA: Diagnosis not present

## 2021-10-08 ENCOUNTER — Other Ambulatory Visit
Admission: RE | Admit: 2021-10-08 | Discharge: 2021-10-08 | Disposition: A | Discharge status: Home / Self Care | Payer: 59 | Attending: Psychiatry | Admitting: Psychiatry

## 2021-10-08 DIAGNOSIS — Z79899 Other long term (current) drug therapy: Secondary | ICD-10-CM | POA: Diagnosis not present

## 2021-10-08 DIAGNOSIS — F250 Schizoaffective disorder, bipolar type: Secondary | ICD-10-CM | POA: Insufficient documentation

## 2021-10-08 LAB — CBC WITH DIFFERENTIAL/PLATELET
Abs Immature Granulocytes: 0.01 10*3/uL (ref 0.00–0.07)
Basophils Absolute: 0.1 10*3/uL (ref 0.0–0.1)
Basophils Relative: 1 %
Eosinophils Absolute: 0.3 10*3/uL (ref 0.0–0.5)
Eosinophils Relative: 3 %
HCT: 38.3 % (ref 36.0–46.0)
Hemoglobin: 12.3 g/dL (ref 12.0–15.0)
Immature Granulocytes: 0 %
Lymphocytes Relative: 31 %
Lymphs Abs: 2.5 10*3/uL (ref 0.7–4.0)
MCH: 31.1 pg (ref 26.0–34.0)
MCHC: 32.1 g/dL (ref 30.0–36.0)
MCV: 96.7 fL (ref 80.0–100.0)
Monocytes Absolute: 0.6 10*3/uL (ref 0.1–1.0)
Monocytes Relative: 7 %
Neutro Abs: 4.7 10*3/uL (ref 1.7–7.7)
Neutrophils Relative %: 58 %
Platelets: 241 10*3/uL (ref 150–400)
RBC: 3.96 MIL/uL (ref 3.87–5.11)
RDW: 12.3 % (ref 11.5–15.5)
WBC: 8.1 10*3/uL (ref 4.0–10.5)
nRBC: 0 % (ref 0.0–0.2)

## 2021-10-15 ENCOUNTER — Other Ambulatory Visit: Payer: Self-pay | Admitting: Psychiatry

## 2021-10-15 DIAGNOSIS — F4001 Agoraphobia with panic disorder: Secondary | ICD-10-CM

## 2021-10-17 NOTE — Progress Notes (Signed)
Noted updated monitoring form

## 2021-11-05 ENCOUNTER — Other Ambulatory Visit
Admission: RE | Admit: 2021-11-05 | Discharge: 2021-11-05 | Disposition: A | Discharge status: Home / Self Care | Payer: 59 | Attending: Psychiatry | Admitting: Psychiatry

## 2021-11-05 DIAGNOSIS — Z79899 Other long term (current) drug therapy: Secondary | ICD-10-CM | POA: Insufficient documentation

## 2021-11-05 DIAGNOSIS — F250 Schizoaffective disorder, bipolar type: Secondary | ICD-10-CM | POA: Insufficient documentation

## 2021-11-05 LAB — CBC WITH DIFFERENTIAL/PLATELET
Abs Immature Granulocytes: 0.03 K/uL (ref 0.00–0.07)
Basophils Absolute: 0.1 K/uL (ref 0.0–0.1)
Basophils Relative: 1 %
Eosinophils Absolute: 0.3 K/uL (ref 0.0–0.5)
Eosinophils Relative: 3 %
HCT: 38.8 % (ref 36.0–46.0)
Hemoglobin: 12.4 g/dL (ref 12.0–15.0)
Immature Granulocytes: 0 %
Lymphocytes Relative: 27 %
Lymphs Abs: 2.3 K/uL (ref 0.7–4.0)
MCH: 31 pg (ref 26.0–34.0)
MCHC: 32 g/dL (ref 30.0–36.0)
MCV: 97 fL (ref 80.0–100.0)
Monocytes Absolute: 0.6 K/uL (ref 0.1–1.0)
Monocytes Relative: 6 %
Neutro Abs: 5.3 K/uL (ref 1.7–7.7)
Neutrophils Relative %: 63 %
Platelets: 242 K/uL (ref 150–400)
RBC: 4 MIL/uL (ref 3.87–5.11)
RDW: 12.5 % (ref 11.5–15.5)
WBC: 8.5 K/uL (ref 4.0–10.5)
nRBC: 0 % (ref 0.0–0.2)

## 2021-11-08 NOTE — Progress Notes (Signed)
Name: Carla Thompson   MRN: [id number]    DOB: Dec 24, 1960   Date:11/09/2021       Progress Note  Subjective  Chief Complaint  Follow Up  HPI  Neck weakness: she has noticed that over the past 6 months, she is able to look up and walks on a regular basis but keeping head up is tiring . She spends a lot of time looking down -reading.   GERD:  she is now taking Famotidine every day and seems to be helping with symptoms, very seldom has nausea but per Dr. Clovis Thompson secondary to psychiatric medication  Chronic constipation: she is taking Trulance daily and when she feels constipated takes Lactulose prn.Under the care of Dr. Vicente Thompson ,no longer having soiling. She states controlling symptoms    Schizophrenia: Seeing psychiatrist, Dr. Clovis Thompson  . No longer has hallucinations. She has been cooking and cleaning again, she also likes to read . Able to have friends over.  No paranoia or hallucinations. She does not drive because of fear or getting in an accident secondary to psychiatric medications. She has a grandson from her daughter Carla Thompson    Tachycardia: doing well on Lopressor half pill twice daily, she denies SOB or chest pain, denies palpitation. Unchanged    Hypertriglyceridemia:   The 10-year ASCVD risk score (Arnett DK, et al., 2019) is: 3.2%   Values used to calculate the score:     Age: 60 years     Sex: Female     Is Non-Hispanic African American: No     Diabetic: No     Tobacco smoker: No     Systolic Blood Pressure: 759 mmHg     Is BP treated: No     HDL Cholesterol: 59 mg/dL     Total Cholesterol: 238 mg/dL     Patient Active Problem List   Diagnosis Date Noted   Change in bowel habits 11/23/2020   Hyperlipidemia 11/20/2017   GERD without esophagitis 02/21/2017   Tachycardia 02/20/2016   RLS (restless legs syndrome) 12/21/2015   IBS (irritable bowel syndrome) 12/21/2015   Other long term (current) drug therapy 12/08/2015   Constipation 12/08/2015   Insomnia, persistent  12/08/2015   Dermatitis, eczematoid 12/08/2015   Gravida 2 para 2 12/08/2015   Hypertriglyceridemia 16/38/4665   Metallic taste 99/35/7017   Female climacteric state 12/08/2015   Schizoaffective disorder, bipolar type ([place]) 12/08/2015    Past Surgical History:  Procedure Laterality Date   ANUS SURGERY  2000   [place]   COLONOSCOPY     COLONOSCOPY WITH PROPOFOL N/A 11/30/2020   Procedure: COLONOSCOPY WITH PROPOFOL;  Surgeon: Carla Bellows, MD;  Location: [facility] ENDOSCOPY;  Service: Gastroenterology;  Laterality: N/A;   TUBAL LIGATION  12/02/1994    Family History  Problem Relation Age of Onset   Mental illness Sister        Schizophrenia and bipolar   Cancer Maternal Grandmother        Colon   Mental illness Paternal Grandmother        Schizophrenia   Breast cancer Neg Hx     Social History   Tobacco Use   Smoking status: Never   Smokeless tobacco: Never  Substance Use Topics   Alcohol use: No    Alcohol/week: 0.0 standard drinks     Current Outpatient Medications:    Ascorbic Acid (VITAMIN C) 500 MG CHEW, Chew by mouth., Disp: , Rfl:    clonazePAM (KLONOPIN) 0.5 MG tablet, TAKE 1 AND 1/2 TABLETS  BY MOUTH EVERY MORNING AND EVERY NIGHT AT BEDTIME, Disp: 90 tablet, Rfl: 3   cloZAPine (CLOZARIL) 100 MG tablet, TAKE 1 TABLET BY MOUTH EVERY MORNING, AND 3 EVERY NIGHT AT BEDTIME, Disp: 120 tablet, Rfl: 6   famotidine (PEPCID) 40 MG tablet, TAKE 1 TABLET(40 MG) BY MOUTH DAILY, Disp: 90 tablet, Rfl: 1   lithium 300 MG tablet, TAKE 2 TABLETS(600 MG) BY MOUTH AT BEDTIME, Disp: 180 tablet, Rfl: 1   metoprolol tartrate (LOPRESSOR) 25 MG tablet, TAKE 1/2 TABLET(12.5 MG) BY MOUTH TWICE DAILY, Disp: 90 tablet, Rfl: 1   TRULANCE 3 MG TABS, TAKE 1 TABLET BY MOUTH DAILY, Disp: 90 tablet, Rfl: 3   zinc gluconate 50 MG tablet, Take 50 mg by mouth daily., Disp: , Rfl:    Vitamin D, Ergocalciferol, (DRISDOL) 1.25 MG (50000 UNIT) CAPS capsule, TAKE 1 CAPSULE BY MOUTH 1 TIME A WEEK (Patient  not taking: Reported on 11/09/2021), Disp: 12 capsule, Rfl: 0  Allergies  Allergen Reactions   Levsin [Hyoscyamine Sulfate] Hives   Escitalopram Hives    I personally reviewed active problem list, medication list, allergies, family history, social history, health maintenance with the patient/caregiver today.   ROS   Constitutional: Negative for fever or weight change.  Respiratory: Negative for cough and shortness of breath.   Cardiovascular: Negative for chest pain or palpitations.  Gastrointestinal: Negative for abdominal pain, no bowel changes.  Musculoskeletal: Negative for gait problem or joint swelling.  Skin: Negative for rash.  Neurological: Negative for dizziness or headache.  No other specific complaints in a complete review of systems (except as listed in HPI above).   Objective  Vitals:   11/09/21 1536  BP: 126/66  Pulse: 89  Resp: 16  Temp: 97.7 F (36.5 C)  TempSrc: Oral  SpO2: 99%  Weight: 142 lb 8 oz (64.6 kg)  Height: 5\' 4"  (1.626 m)    Body mass index is 24.46 kg/m.  Physical Exam  Constitutional: Patient appears well-developed and well-nourished.  No distress.  HEENT: head atraumatic, normocephalic, pupils equal and reactive to light,  neck supple, kyphosis c-spine, she is able to elevated her head , seems to prefer looking down.  Cardiovascular: Normal rate, regular rhythm and normal heart sounds.  No murmur heard. No BLE edema. Pulmonary/Chest: Effort normal and breath sounds normal. No respiratory distress. Abdominal: Soft.  There is no tenderness. Psychiatric: Patient has a normal mood and affect. behavior is normal. Judgment and thought content normal.   Recent Results (from the past 2160 hour(s))  CBC with Differential/Platelet     Status: None   Collection Time: 08/13/21 11:53 AM  Result Value Ref Range   WBC 8.7 4.0 - 10.5 K/uL   RBC 3.95 3.87 - 5.11 MIL/uL   Hemoglobin 12.1 12.0 - 15.0 g/dL   HCT 38.3 36.0 - 46.0 %   MCV 97.0 80.0 -  100.0 fL   MCH 30.6 26.0 - 34.0 pg   MCHC 31.6 30.0 - 36.0 g/dL   RDW 12.1 11.5 - 15.5 %   Platelets 234 150 - 400 K/uL   nRBC 0.0 0.0 - 0.2 %   Neutrophils Relative % 66 %   Neutro Abs 5.8 1.7 - 7.7 K/uL   Lymphocytes Relative 24 %   Lymphs Abs 2.1 0.7 - 4.0 K/uL   Monocytes Relative 7 %   Monocytes Absolute 0.6 0.1 - 1.0 K/uL   Eosinophils Relative 2 %   Eosinophils Absolute 0.2 0.0 - 0.5 K/uL   Basophils  Relative 1 %   Basophils Absolute 0.1 0.0 - 0.1 K/uL   Immature Granulocytes 0 %   Abs Immature Granulocytes 0.03 0.00 - 0.07 K/uL    Comment: Performed at [facility], [address] [place], [place] [address]  CBC with Differential/Platelet     Status: None   Collection Time: 09/10/21 11:56 AM  Result Value Ref Range   WBC 8.3 4.0 - 10.5 K/uL   RBC 4.06 3.87 - 5.11 MIL/uL   Hemoglobin 12.7 12.0 - 15.0 g/dL   HCT 39.1 36.0 - 46.0 %   MCV 96.3 80.0 - 100.0 fL   MCH 31.3 26.0 - 34.0 pg   MCHC 32.5 30.0 - 36.0 g/dL   RDW 12.1 11.5 - 15.5 %   Platelets 251 150 - 400 K/uL   nRBC 0.0 0.0 - 0.2 %   Neutrophils Relative % 62 %   Neutro Abs 5.2 1.7 - 7.7 K/uL   Lymphocytes Relative 28 %   Lymphs Abs 2.3 0.7 - 4.0 K/uL   Monocytes Relative 6 %   Monocytes Absolute 0.5 0.1 - 1.0 K/uL   Eosinophils Relative 3 %   Eosinophils Absolute 0.3 0.0 - 0.5 K/uL   Basophils Relative 1 %   Basophils Absolute 0.0 0.0 - 0.1 K/uL   Immature Granulocytes 0 %   Abs Immature Granulocytes 0.03 0.00 - 0.07 K/uL    Comment: Performed at [facility], [address] [place], [place] [address]  CBC with Differential/Platelet     Status: None   Collection Time: 10/08/21 11:56 AM  Result Value Ref Range   WBC 8.1 4.0 - 10.5 K/uL   RBC 3.96 3.87 - 5.11 MIL/uL   Hemoglobin 12.3 12.0 - 15.0 g/dL   HCT 38.3 36.0 - 46.0 %   MCV 96.7 80.0 - 100.0 fL   MCH 31.1 26.0 - 34.0 pg   MCHC 32.1 30.0 - 36.0 g/dL   RDW 12.3 11.5 - 15.5 %   Platelets 241 150 - 400 K/uL   nRBC 0.0 0.0 -  0.2 %   Neutrophils Relative % 58 %   Neutro Abs 4.7 1.7 - 7.7 K/uL   Lymphocytes Relative 31 %   Lymphs Abs 2.5 0.7 - 4.0 K/uL   Monocytes Relative 7 %   Monocytes Absolute 0.6 0.1 - 1.0 K/uL   Eosinophils Relative 3 %   Eosinophils Absolute 0.3 0.0 - 0.5 K/uL   Basophils Relative 1 %   Basophils Absolute 0.1 0.0 - 0.1 K/uL   Immature Granulocytes 0 %   Abs Immature Granulocytes 0.01 0.00 - 0.07 K/uL    Comment: Performed at [facility], [address] [place], [place] [address]  CBC with Differential/Platelet     Status: None   Collection Time: 11/05/21 11:54 AM  Result Value Ref Range   WBC 8.5 4.0 - 10.5 K/uL   RBC 4.00 3.87 - 5.11 MIL/uL   Hemoglobin 12.4 12.0 - 15.0 g/dL   HCT 38.8 36.0 - 46.0 %   MCV 97.0 80.0 - 100.0 fL   MCH 31.0 26.0 - 34.0 pg   MCHC 32.0 30.0 - 36.0 g/dL   RDW 12.5 11.5 - 15.5 %   Platelets 242 150 - 400 K/uL   nRBC 0.0 0.0 - 0.2 %   Neutrophils Relative % 63 %   Neutro Abs 5.3 1.7 - 7.7 K/uL   Lymphocytes Relative 27 %   Lymphs Abs 2.3 0.7 - 4.0 K/uL  Monocytes Relative 6 %   Monocytes Absolute 0.6 0.1 - 1.0 K/uL   Eosinophils Relative 3 %   Eosinophils Absolute 0.3 0.0 - 0.5 K/uL   Basophils Relative 1 %   Basophils Absolute 0.1 0.0 - 0.1 K/uL   Immature Granulocytes 0 %   Abs Immature Granulocytes 0.03 0.00 - 0.07 K/uL    Comment: Performed at [facility], [place], [place] [address]     PHQ2/9: Depression screen [facility] 2/9 11/09/2021 05/14/2021 02/02/2021 10/04/2020 09/28/2020  Decreased Interest 0 1 0 3 3  Down, Depressed, Hopeless 0 0 0 3 3  PHQ - 2 Score 0 1 0 6 6  Altered sleeping 0 1 0 3 -  Tired, decreased energy 0 1 3 3  -  Change in appetite 0 0 0 3 -  Feeling bad or failure about yourself  0 0 0 2 -  Trouble concentrating 0 2 1 3  -  Moving slowly or fidgety/restless 0 3 0 2 -  Suicidal thoughts 0 0 0 0 -  PHQ-9 Score 0 8 4 22  -  Difficult doing work/chores Not difficult at all Somewhat  difficult - Very difficult -  Some recent data might be hidden    phq 9 is negative   Fall Risk: Fall Risk  11/09/2021 05/14/2021 02/02/2021 10/04/2020 09/28/2020  Falls in the past year? 0 1 1 1 1   Comment - - - - -  Number falls in past yr: 0 1 0 1 1  Comment - - - - -  Injury with Fall? 0 0 0 0 0  Comment - - - - -  Risk for fall due to : No Fall Risks History of fall(s) - History of fall(s) -  Follow up Falls prevention discussed Falls prevention discussed - - -      Functional Status Survey: Is the patient deaf or have difficulty hearing?: No Does the patient have difficulty seeing, even when wearing glasses/contacts?: No Does the patient have difficulty concentrating, remembering, or making decisions?: No Does the patient have difficulty walking or climbing stairs?: No Does the patient have difficulty dressing or bathing?: No Does the patient have difficulty doing errands alone such as visiting a doctor's office or shopping?: No    Assessment & Plan  1. Other kyphosis of cervical region  - DG Cervical Spine Complete; Future - Ambulatory referral to Physical Therapy  2. Schizoaffective disorder, bipolar type ([place])  - DG Cervical Spine Complete; Future  3. Neck muscle weakness  - Ambulatory referral to Physical Therapy  4. Hyperglycemia   5. GERD without esophagitis   6. Vitamin D deficiency  - Vitamin D, Ergocalciferol, (DRISDOL) 1.25 MG (50000 UNIT) CAPS capsule; Take 1 capsule (50,000 Units total) by mouth every 7 (seven) days.  Dispense: 12 capsule; Refill: 1  7. Chronic constipation   8. Irritable bowel syndrome with constipation   9. Hypertriglyceridemia   10. Tachycardia

## 2021-11-09 ENCOUNTER — Encounter: Payer: Self-pay | Admitting: Family Medicine

## 2021-11-09 ENCOUNTER — Ambulatory Visit (INDEPENDENT_AMBULATORY_CARE_PROVIDER_SITE_OTHER): Payer: 59 | Admitting: Family Medicine

## 2021-11-09 ENCOUNTER — Other Ambulatory Visit: Payer: Self-pay

## 2021-11-09 VITALS — BP 126/66 | HR 89 | Temp 97.7°F | Resp 16 | Ht 64.0 in | Wt 142.5 lb

## 2021-11-09 DIAGNOSIS — K5909 Other constipation: Secondary | ICD-10-CM

## 2021-11-09 DIAGNOSIS — M40292 Other kyphosis, cervical region: Secondary | ICD-10-CM | POA: Diagnosis not present

## 2021-11-09 DIAGNOSIS — R739 Hyperglycemia, unspecified: Secondary | ICD-10-CM | POA: Diagnosis not present

## 2021-11-09 DIAGNOSIS — M5382 Other specified dorsopathies, cervical region: Secondary | ICD-10-CM

## 2021-11-09 DIAGNOSIS — E781 Pure hyperglyceridemia: Secondary | ICD-10-CM

## 2021-11-09 DIAGNOSIS — K219 Gastro-esophageal reflux disease without esophagitis: Secondary | ICD-10-CM

## 2021-11-09 DIAGNOSIS — F250 Schizoaffective disorder, bipolar type: Secondary | ICD-10-CM

## 2021-11-09 DIAGNOSIS — Z23 Encounter for immunization: Secondary | ICD-10-CM | POA: Diagnosis not present

## 2021-11-09 DIAGNOSIS — Z79899 Other long term (current) drug therapy: Secondary | ICD-10-CM

## 2021-11-09 DIAGNOSIS — E559 Vitamin D deficiency, unspecified: Secondary | ICD-10-CM

## 2021-11-09 DIAGNOSIS — K581 Irritable bowel syndrome with constipation: Secondary | ICD-10-CM

## 2021-11-09 DIAGNOSIS — R000 Tachycardia, unspecified: Secondary | ICD-10-CM

## 2021-11-09 MED ORDER — VITAMIN D (ERGOCALCIFEROL) 1.25 MG (50000 UNIT) PO CAPS: 50000.0000 [IU] | ORAL_CAPSULE | ORAL | 1 refills | Status: DC

## 2021-11-09 NOTE — Patient Instructions (Signed)
Call insurance to find out if covered by insurance

## 2021-11-10 LAB — COMPLETE METABOLIC PANEL WITH GFR
AG Ratio: 1.8 (calc) (ref 1.0–2.5)
ALT: 9 U/L (ref 6–29)
AST: 14 U/L (ref 10–35)
Albumin: 4.1 g/dL (ref 3.6–5.1)
Alkaline phosphatase (APISO): 109 U/L (ref 37–153)
BUN: 13 mg/dL (ref 7–25)
CO2: 30 mmol/L (ref 20–32)
Calcium: 9.3 mg/dL (ref 8.6–10.4)
Chloride: 101 mmol/L (ref 98–110)
Creat: 0.91 mg/dL (ref 0.50–1.03)
Globulin: 2.3 g/dL (calc) (ref 1.9–3.7)
Glucose, Bld: 94 mg/dL (ref 65–99)
Potassium: 4.2 mmol/L (ref 3.5–5.3)
Sodium: 137 mmol/L (ref 135–146)
Total Bilirubin: 0.3 mg/dL (ref 0.2–1.2)
Total Protein: 6.4 g/dL (ref 6.1–8.1)
eGFR: 73 mL/min/{1.73_m2} (ref >=60)

## 2021-11-10 LAB — LIPID PANEL
Cholesterol: 235 mg/dL — ABNORMAL HIGH (ref <200)
HDL: 63 mg/dL (ref >=50)
LDL Cholesterol (Calc): 133 mg/dL (calc) — ABNORMAL HIGH
Non-HDL Cholesterol (Calc): 172 mg/dL (calc) — ABNORMAL HIGH (ref <130)
Total CHOL/HDL Ratio: 3.7 (calc) (ref <5.0)
Triglycerides: 240 mg/dL — ABNORMAL HIGH (ref <150)

## 2021-11-11 ENCOUNTER — Other Ambulatory Visit: Payer: Self-pay | Admitting: Family Medicine

## 2021-11-11 DIAGNOSIS — R000 Tachycardia, unspecified: Secondary | ICD-10-CM

## 2021-11-11 NOTE — Telephone Encounter (Signed)
Requested Prescriptions  Pending Prescriptions Disp Refills  . metoprolol tartrate (LOPRESSOR) 25 MG tablet [Pharmacy Med Name: METOPROLOL TARTRATE 25MG  TABLETS] 90 tablet 1    Sig: TAKE 1/2 TABLET(12.5 MG) BY MOUTH TWICE DAILY     Cardiovascular:  Beta Blockers Passed - 11/11/2021  6:57 AM      Passed - Last BP in normal range    BP Readings from Last 1 Encounters:  11/09/21 126/66         Passed - Last Heart Rate in normal range    Pulse Readings from Last 1 Encounters:  11/09/21 89         Passed - Valid encounter within last 6 months    Recent Outpatient Visits          2 days ago Schizoaffective disorder, bipolar type [facility])   [facility] Steele Sizer, MD   6 months ago Schizoaffective disorder, bipolar type [facility])   [facility] Steele Sizer, MD   9 months ago Heel pain, bilateral   [facility] Steele Sizer, MD   1 year ago Schizoaffective disorder, bipolar type [facility])   [facility] Steele Sizer, MD   1 year ago Encounter for examination following treatment at hospital   [place], [place]      Future Appointments            In 3 months Steele Sizer, MD [facility], [place]   In 6 months Steele Sizer, MD [facility], [facility]

## 2021-11-19 ENCOUNTER — Ambulatory Visit: Payer: 59 | Attending: Family Medicine | Admitting: Physical Therapy

## 2021-11-19 DIAGNOSIS — M5382 Other specified dorsopathies, cervical region: Secondary | ICD-10-CM | POA: Diagnosis not present

## 2021-11-19 DIAGNOSIS — M40292 Other kyphosis, cervical region: Secondary | ICD-10-CM | POA: Diagnosis not present

## 2021-11-19 DIAGNOSIS — M545 Low back pain, unspecified: Secondary | ICD-10-CM | POA: Insufficient documentation

## 2021-11-19 DIAGNOSIS — R293 Abnormal posture: Secondary | ICD-10-CM | POA: Diagnosis not present

## 2021-11-19 DIAGNOSIS — M6281 Muscle weakness (generalized): Secondary | ICD-10-CM | POA: Insufficient documentation

## 2021-11-19 DIAGNOSIS — G8929 Other chronic pain: Secondary | ICD-10-CM | POA: Diagnosis present
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
## 2021-11-20 ENCOUNTER — Encounter: Payer: Self-pay | Admitting: Physical Therapy

## 2021-11-23 ENCOUNTER — Telehealth: Payer: Self-pay

## 2021-11-23 NOTE — Telephone Encounter (Signed)
Copied from [place] [phone]. Topic: General - Other >> Nov 22, 2021  4:37 PM Pawlus, Brayton Layman A wrote: Reason for CRM: Pt called in wanting to go over her lab results from 12/9 , please advise.

## 2021-11-23 NOTE — Telephone Encounter (Signed)
Called patient, answered all her questions and congratulated her onher lab improvements. She was thankful and appreciative and had no further questions or concerns to express at this time.

## 2021-11-28 ENCOUNTER — Encounter: Payer: 59 | Admitting: Physical Therapy

## 2021-12-04 ENCOUNTER — Other Ambulatory Visit
Admission: RE | Admit: 2021-12-04 | Discharge: 2021-12-04 | Disposition: A | Discharge status: Home / Self Care | Payer: 59 | Attending: Psychiatry | Admitting: Psychiatry

## 2021-12-04 ENCOUNTER — Encounter: Payer: Self-pay | Admitting: Physical Therapy

## 2021-12-04 ENCOUNTER — Ambulatory Visit: Payer: 59 | Attending: Family Medicine | Admitting: Physical Therapy

## 2021-12-04 DIAGNOSIS — M6281 Muscle weakness (generalized): Secondary | ICD-10-CM | POA: Diagnosis present

## 2021-12-04 DIAGNOSIS — R293 Abnormal posture: Secondary | ICD-10-CM | POA: Insufficient documentation

## 2021-12-04 DIAGNOSIS — G8929 Other chronic pain: Secondary | ICD-10-CM | POA: Diagnosis present

## 2021-12-04 DIAGNOSIS — F250 Schizoaffective disorder, bipolar type: Secondary | ICD-10-CM | POA: Insufficient documentation

## 2021-12-04 DIAGNOSIS — M545 Low back pain, unspecified: Secondary | ICD-10-CM | POA: Diagnosis present

## 2021-12-04 DIAGNOSIS — Z79899 Other long term (current) drug therapy: Secondary | ICD-10-CM | POA: Diagnosis not present

## 2021-12-04 LAB — CBC WITH DIFFERENTIAL/PLATELET
Abs Immature Granulocytes: 0.03 10*3/uL (ref 0.00–0.07)
Basophils Absolute: 0.1 10*3/uL (ref 0.0–0.1)
Basophils Relative: 1 %
Eosinophils Absolute: 0.2 10*3/uL (ref 0.0–0.5)
Eosinophils Relative: 3 %
HCT: 37.2 % (ref 36.0–46.0)
Hemoglobin: 11.8 g/dL — ABNORMAL LOW (ref 12.0–15.0)
Immature Granulocytes: 0 %
Lymphocytes Relative: 26 %
Lymphs Abs: 2.3 10*3/uL (ref 0.7–4.0)
MCH: 30.4 pg (ref 26.0–34.0)
MCHC: 31.7 g/dL (ref 30.0–36.0)
MCV: 95.9 fL (ref 80.0–100.0)
Monocytes Absolute: 0.5 10*3/uL (ref 0.1–1.0)
Monocytes Relative: 5 %
Neutro Abs: 5.6 10*3/uL (ref 1.7–7.7)
Neutrophils Relative %: 65 %
Platelets: 254 10*3/uL (ref 150–400)
RBC: 3.88 MIL/uL (ref 3.87–5.11)
RDW: 12.8 % (ref 11.5–15.5)
WBC: 8.6 10*3/uL (ref 4.0–10.5)
nRBC: 0 % (ref 0.0–0.2)

## 2021-12-04 NOTE — Therapy (Signed)
[facility] [address], [place], [address] Phone: [phone]   Fax:  [phone]  Physical Therapy Treatment  Patient Details  Name: Carla Thompson MRN: [id number] Date of Birth: [birth year] Referring Provider (PT): Steele Sizer, MD   Encounter Date: 12/04/2021   PT End of Session - 12/04/21 1838     Visit Number 2    Number of Visits 24    Date for PT Re-Evaluation 02/11/22    Authorization Type AETNA reporting period from 11/19/2021    Authorization Time Period 41 per cal yr    Authorization - Visit Number 2    Authorization - Number of Visits 30    Progress Note Due on Visit 10    PT Start Time 1738    PT Stop Time 1825    PT Time Calculation (min) 47 min    Activity Tolerance Patient tolerated treatment well    Behavior During Therapy [facility] for tasks assessed/performed   requires repeated explanation/education            Past Medical History:  Diagnosis Date   Abnormal perimenopausal bleeding    Bowel incontinence    Chronic constipation    Dermatitis    High risk medication use    Hyperglycemia    Hypertriglyceridemia    Iron deficiency anemia due to chronic blood loss    secondary to heavy flow   Metallic taste    Schizo-affective psychosis ([place])    Schizoaffective disorder, bipolar type ([place])    Dr. Tyrone Sage    Past Surgical History:  Procedure Laterality Date   ANUS SURGERY  2000   [place]   COLONOSCOPY     COLONOSCOPY WITH PROPOFOL N/A 11/30/2020   Procedure: COLONOSCOPY WITH PROPOFOL;  Surgeon: [name], MD;  Location: [facility] ENDOSCOPY;  Service: Gastroenterology;  Laterality: N/A;   TUBAL LIGATION  12/02/1994    There were no vitals filed for this visit.   Subjective Assessment - 12/04/21 1839     Subjective Patient arrives with her husband Carla Thompson who accompanied her throughout session. She states she has been laying on her back 2x15 min a day without  a pillow. She states it hurts her neck some at first and he states her head is there without support some.    Pertinent History Patient is a 61 y.o. female who presents to outpatient physical therapy with a referral for medical diagnosis neck muscle weakness, other kyphosis of cervical region. This patient's chief complaints consist of poor posture and inability to keep head and upper back in upright position, low back pain, leading to the following functional deficits: feels embarrassed to be seen in public or any social situation, difficulty with everyday activities such as vacuuming.  Relevant past medical history and comorbidities include schizoaffective disorder bipolar type, tachycardia, GERD, IBS, insomnia, bowel incontinence, chronic constipation.  Patient denies hx of cancer, stroke, seizures, lung problem, major cardiac events aside from tachycardia, diabetes, unexplained weight loss, stumbling or dropping things worsening lately.    Limitations Other (comment);House hold activities   Functional Limitations: Neck/posture it is embarrassing to go out in public or participate in any social activities.     Back pain limits normal everyday activities, vacuuming,   Diagnostic tests cervical spine radiograph ordered per chart review (pt unaware)    Patient Stated Goals to get better. "I don't want to be a hunch back"  TREATMENT:    Therapeutic exercise: to centralize symptoms and improve ROM, strength, muscular endurance, and activity tolerance required for successful completion of functional activities.  - hooklying on flat surface for 2 min (improved posture for a few min upon returning to upright).   Hooklying with just enough head support to prevent pain:  - B horizontal abduction with YTB, 2x10 each side - D2 shoulder flexion with YTB, 1x10 each side  PRONE with face in table cradle - B scapular retraction with hands at side, 2x10 - B horizontal shoulder abduction,  2x10 - attempted prone angel but unable.  - upper back/face lift, 3x10 with hands assisting like push up, hands at face height, and hands along sides.   - standing wall angels 1x10  - standing facing wall shoulder extension hand lift offs (alternating sides), 1x5 each side good quality, 2x5 each side with worsening quality.   SEATED at [facility] - row with chest supported, 2x10 with 5/10# (attempted yoga block at chest to improve leverage, minimal success).  - lat pull, (AAROM for return), 2x10 at 10/20#  - standing scapular row at [place], 2x10 with 10# (AAROM on eccentric phase).  - standing at wall holding yoga block on wall with head (unable for more than one second) - standing back to wall B scaption with 2#DB, 2x10 (worsening form, at first attempted to hold yoga block on wall with head unsuccessfully).   - Education on HEP including handout   Manual therapy: to reduce pain and tissue tension, improve range of motion, neuromodulation, in order to promote improved ability to complete functional activities. PRONE (face in table cradle).  - CPA grade III-IV along thoracic and cervical spine (painful, very hypomobile with significant kyphotic deformity).   Pt required multimodal cuing for proper technique and to facilitate improved neuromuscular control, strength, range of motion, and functional ability resulting in improved performance and form. Patient required active assistance for most activities for most reps due to poor coordination, executive function so unable to organize form consistently.     HOME EXERCISE PROGRAM Laying supine with very thin or no pillow 2x15 min each day.  HOME EXERCISE PROGRAM [[id number]]  Wall Slides with Lift Off -  Repeat 10 Times, Complete 3 Sets, Perform 2 Times a Day    PT Education - 12/04/21 1840     Education Details Exercise purpose/form. Self management techniques. Education on [employer]) Educated Patient;Spouse    Methods  Explanation;Demonstration;Tactile cues;Handout;Verbal cues    Comprehension Verbalized understanding;Returned demonstration;Verbal cues required;Tactile cues required;Need further instruction              PT Short Term Goals - 11/20/21 1023       PT SHORT TERM GOAL #1   Title Be independent with initial home exercise program for self-management of symptoms.    Baseline Initial HEP provided at IE (11/19/2021);    Time 2    Period Weeks    Status New    Target Date 12/04/21               PT Long Term Goals - 11/20/21 1024       PT LONG TERM GOAL #1   Title Be independent with a long-term home exercise program for self-management of symptoms.    Baseline Initial HEP provided at IE (11/19/2021);    Time 12    Period Weeks    Status New   TARGET DATE FOR ALL LONG TERM GOALS: 02/11/2022  PT LONG TERM GOAL #2   Title Demonstrate improved FOTO score by 10 units to demonstrate improvement in overall condition and self-reported functional ability.    Baseline to be assessed visit 2 as appropriate (11/19/2021);    Time 12    Period Weeks    Status New      PT LONG TERM GOAL #3   Title Have full cervical spine AROM with no compensations outside of those expected from scoliosis or increase in pain in all planes except intermittent end range discomfort to improve ability  to maintain healthy posture and complete valued community activities with confidence such as grocery shopping.    Baseline Stiff and weak - unalbe to maintain appropriate posture -  see objective exam (11/19/2021);    Time 12    Period Weeks    Status New      PT LONG TERM GOAL #4   Title Paient will demonstrate ability to maintain upright posture for equal or greater than 30 min during PT sessions to demonstrate improved postural awareness, strength, endurance and motor control for improved function in public/social places including grocery shopping or visiting family.    Baseline unable to keep head up for  more than a few seconds (11/19/2021);    Time 12    Period Weeks    Status New      PT LONG TERM GOAL #5   Title Complete community, work and/or recreational activities without limitation due to current condition.    Baseline Neck/posture it is embarrassing to go out in public or participate in any social activities.     Back pain limits normal everyday activities, vacuuming (11/19/2021);    Time 12    Period Weeks    Status New                   Plan - 12/04/21 1852     Clinical Impression Statement Patient tolerated treatment well overall with difficulty due to poor postural awareness, postural stiffness, and difficulty learning and coordinating exercises due to cognitive limitations. Patient's husband in attendance and felt comfortable helping her with updated HEP at home. Patient does not have good eccentric control and needed physical assistance to keep movements controlled to prevent injury from sudden release of effort. Patient did appear to have improved posture compared to initial eval but continues to lack awareness, endurance, and mobility for sustained normal upper body posture. Patient would benefit from continued management of limiting condition by skilled physical therapist to address remaining impairments and functional limitations to work towards stated goals and return to PLOF or maximal functional independence    Personal Factors and Comorbidities Behavior Pattern;Fitness;Time since onset of injury/illness/exacerbation;Education;Past/Current Experience    Examination-Activity Limitations Reach Overhead;Other   difficulty with everyday activities such as vacuuming.   Examination-Participation Restrictions Other;Interpersonal Relationship;Community Activity;Cleaning   eels embarrassed to be seen in public or any social situation   Stability/Clinical Decision Making Evolving/Moderate complexity    Rehab Potential Good    PT Frequency 2x / week    PT Duration 12 weeks     PT Treatment/Interventions ADLs/Self Care Home Management;Cryotherapy;Moist Heat;Cognitive remediation;Neuromuscular re-education;Balance training;Therapeutic exercise;Therapeutic activities;Patient/family education;Manual techniques;Passive range of motion;Dry needling    PT Next Visit Plan update HEP as appropriate, FOTO, assess balance, postural strengthening, stretching, motor control    PT Home Exercise Plan hooklying laying as flat as possible 15 min a day, wall scaption lift offs    Consulted and Agree with Plan of Care Patient;Family member/caregiver  Family Member Consulted Husband Carla Thompson             Patient will benefit from skilled therapeutic intervention in order to improve the following deficits and impairments:  Decreased cognition, Increased fascial restricitons, Improper body mechanics, Pain, Postural dysfunction, Decreased mobility, Decreased activity tolerance, Decreased endurance, Decreased range of motion, Decreased strength, Hypomobility, Impaired perceived functional ability, Impaired flexibility, Decreased balance  Visit Diagnosis: Abnormal posture  Muscle weakness (generalized)  Chronic bilateral low back pain without sciatica     Problem List Patient Active Problem List   Diagnosis Date Noted   Change in bowel habits 11/23/2020   Hyperlipidemia 11/20/2017   GERD without esophagitis 02/21/2017   Tachycardia 02/20/2016   RLS (restless legs syndrome) 12/21/2015   IBS (irritable bowel syndrome) 12/21/2015   Other long term (current) drug therapy 12/08/2015   Constipation 12/08/2015   Insomnia, persistent 12/08/2015   Dermatitis, eczematoid 12/08/2015   Gravida 2 para 2 12/08/2015   Hypertriglyceridemia 15/17/6160   Metallic taste 73/71/0626   Female climacteric state 12/08/2015   Schizoaffective disorder, bipolar type ([place]) 12/08/2015    Everlean Alstrom. Graylon Good, PT, DPT 12/04/21, 6:53 PM   [place] [facility] [address] [place], [address] Phone: [phone]   Fax:  [phone]  Name: Carla Thompson MRN: [id number] Date of Birth: 15-Sep-1961

## 2021-12-06 ENCOUNTER — Encounter: Payer: Self-pay | Admitting: Physical Therapy

## 2021-12-06 ENCOUNTER — Other Ambulatory Visit: Payer: Self-pay

## 2021-12-06 ENCOUNTER — Ambulatory Visit: Payer: 59 | Admitting: Physical Therapy

## 2021-12-06 DIAGNOSIS — M6281 Muscle weakness (generalized): Secondary | ICD-10-CM

## 2021-12-06 DIAGNOSIS — M545 Low back pain, unspecified: Secondary | ICD-10-CM

## 2021-12-06 DIAGNOSIS — R293 Abnormal posture: Secondary | ICD-10-CM | POA: Diagnosis not present

## 2021-12-06 DIAGNOSIS — G8929 Other chronic pain: Secondary | ICD-10-CM

## 2021-12-06 NOTE — Therapy (Signed)
[facility] [facility] [address], [place], [address] Phone: [phone]   Fax:  [phone]  Physical Therapy Treatment  Patient Details  Name: Carla Thompson MRN: [id number] Date of Birth: [birth year] Referring Provider (PT): Steele Sizer, MD   Encounter Date: 12/06/2021   PT End of Session - 12/06/21 1917     Visit Number 3    Number of Visits 24    Date for PT Re-Evaluation 02/11/22    Authorization Type AETNA reporting period from 11/19/2021    Authorization Time Period 60 per cal yr    Authorization - Visit Number 3    Authorization - Number of Visits 30    Progress Note Due on Visit 10    PT Start Time 1737    PT Stop Time 1815    PT Time Calculation (min) 38 min    Activity Tolerance Patient tolerated treatment well    Behavior During Therapy [facility] for tasks assessed/performed   requires repeated explanation/education            Past Medical History:  Diagnosis Date   Abnormal perimenopausal bleeding    Bowel incontinence    Chronic constipation    Dermatitis    High risk medication use    Hyperglycemia    Hypertriglyceridemia    Iron deficiency anemia due to chronic blood loss    secondary to heavy flow   Metallic taste    Schizo-affective psychosis ([place])    Schizoaffective disorder, bipolar type ([place])    Dr. Tyrone Sage    Past Surgical History:  Procedure Laterality Date   ANUS SURGERY  2000   [place]   COLONOSCOPY     COLONOSCOPY WITH PROPOFOL N/A 11/30/2020   Procedure: COLONOSCOPY WITH PROPOFOL;  Surgeon: [name], MD;  Location: [facility] ENDOSCOPY;  Service: Gastroenterology;  Laterality: N/A;   TUBAL LIGATION  12/02/1994    There were no vitals filed for this visit.   Subjective Assessment - 12/06/21 1918     Subjective Patient arrives accompanied by husband [name]. She states she has been doing her exercises standing against the wall and feel like these help her.  [name] reports she had difficulty with the wall lift offs after the first 10 similar to how it was in the clinic at her last appointment. She states she has some neck pain but does not give a numeric rating. She states her neck feels really stiff and she thinks her medications cause this.    Pertinent History Patient is a 61 y.o. female who presents to outpatient physical therapy with a referral for medical diagnosis neck muscle weakness, other kyphosis of cervical region. This patient's chief complaints consist of poor posture and inability to keep head and upper back in upright position, low back pain, leading to the following functional deficits: feels embarrassed to be seen in public or any social situation, difficulty with everyday activities such as vacuuming.  Relevant past medical history and comorbidities include schizoaffective disorder bipolar type, tachycardia, GERD, IBS, insomnia, bowel incontinence, chronic constipation.  Patient denies hx of cancer, stroke, seizures, lung problem, major cardiac events aside from tachycardia, diabetes, unexplained weight loss, stumbling or dropping things worsening lately.    Limitations Other (comment);House hold activities   Functional Limitations: Neck/posture it is embarrassing to go out in public or participate in any social activities.     Back pain limits normal everyday activities, vacuuming,   Diagnostic tests cervical spine radiograph ordered per chart  review (pt unaware)    Patient Stated Goals to get better. "I don't want to be a hunch back"    Currently in Pain? Yes               TREATMENT:    Therapeutic exercise: to centralize symptoms and improve ROM, strength, muscular endurance, and activity tolerance required for successful completion of functional activities.  - hooklying on flat surface for 3 min (improved posture for a few min upon returning to upright).  - supine AAROM cervical nod, 3x10 to help patient improve posture, stretch  suboccipital region, and work toward being able to perform chin tucks for strengthening of deep cervical flexors.  - standing facing wall shoulder extension hand lift offs (alternating sides), 3x10 each side good quality, 2 to look for hands and with sticky note in front of face to help keep head up. PT shaking alternating hands for patient to target to improve form.  - seated thoracic extension with towel roll behind upper back, hands clasped behind head. 3x10 - Education on HEP including handout    Manual therapy: to reduce pain and tissue tension, improve range of motion, neuromodulation, in order to promote improved ability to complete functional activities. SUPINE with head flat on table - CPA grade III along upper thoracic and cervical spine - cervical spine upglides grades III-IV bilaterally at each segment as tolerated - PROM rotation with overpressure to tolerance, 1x10 each side, 1-5 second holds  - PROM upper trap stretch, 2x30 seconds each side - gentle intermittent cervical distraction  (very hypomobile with significant kyphotic deformity).    Pt required multimodal cuing for proper technique and to facilitate improved neuromuscular control, strength, range of motion, and functional ability resulting in improved performance and form. Patient required intense cuing to [facility] assistance for most activities for most reps due to poor coordination, executive function limiting ability to organize form consistently.      HOME EXERCISE PROGRAM Access Code: [email or web address] URL: [email or web address] Date: 12/06/2021 Prepared by: Rosita Kea  Exercises Seated Thoracic Lumbar Extension with Pectoralis Stretch - 1 x daily - 3 sets - 20 reps Laying supine with very thin or no pillow 2x15 min each day.  HOME EXERCISE PROGRAM [[id number]]   Wall Slides with Lift Off -  Repeat 10 Times, Complete 3 Sets, Perform 2 Times a Day    PT Education - 12/06/21 1917     Education Details  Exercise purpose/form. Self management techniques. Education on [employer]) Educated Spouse;Patient    Methods Explanation;Demonstration;Tactile cues;Verbal cues;Handout    Comprehension Verbalized understanding;Tactile cues required;Verbal cues required;Returned demonstration;Need further instruction              PT Short Term Goals - 11/20/21 1023       PT SHORT TERM GOAL #1   Title Be independent with initial home exercise program for self-management of symptoms.    Baseline Initial HEP provided at IE (11/19/2021);    Time 2    Period Weeks    Status New    Target Date 12/04/21               PT Long Term Goals - 11/20/21 1024       PT LONG TERM GOAL #1   Title Be independent with a long-term home exercise program for self-management of symptoms.    Baseline Initial HEP provided at IE (11/19/2021);    Time 12    Period Weeks    Status New  TARGET DATE FOR ALL LONG TERM GOALS: 02/11/2022     PT LONG TERM GOAL #2   Title Demonstrate improved FOTO score by 10 units to demonstrate improvement in overall condition and self-reported functional ability.    Baseline to be assessed visit 2 as appropriate (11/19/2021);    Time 12    Period Weeks    Status New      PT LONG TERM GOAL #3   Title Have full cervical spine AROM with no compensations outside of those expected from scoliosis or increase in pain in all planes except intermittent end range discomfort to improve ability  to maintain healthy posture and complete valued community activities with confidence such as grocery shopping.    Baseline Stiff and weak - unalbe to maintain appropriate posture -  see objective exam (11/19/2021);    Time 12    Period Weeks    Status New      PT LONG TERM GOAL #4   Title Paient will demonstrate ability to maintain upright posture for equal or greater than 30 min during PT sessions to demonstrate improved postural awareness, strength, endurance and motor control for improved  function in public/social places including grocery shopping or visiting family.    Baseline unable to keep head up for more than a few seconds (11/19/2021);    Time 12    Period Weeks    Status New      PT LONG TERM GOAL #5   Title Complete community, work and/or recreational activities without limitation due to current condition.    Baseline Neck/posture it is embarrassing to go out in public or participate in any social activities.     Back pain limits normal everyday activities, vacuuming (11/19/2021);    Time 12    Period Weeks    Status New                   Plan - 12/06/21 1917     Clinical Impression Statement Patient tolerated treatment well this session and was better able to complete exercises with improvements in cuing that connected with patient. Husband [name] present throughout session to be able to better assist patient with HEP. Patient continues to be very stiff but was able to lie supine with head on table at end of manual therapy. Patient encouraged to look at her surroundings above shoulder height when walking but reverted to looking down without constant cuing. Patient would benefit from continued management of limiting condition by skilled physical therapist to address remaining impairments and functional limitations to work towards stated goals and return to PLOF or maximal functional independence.    Personal Factors and Comorbidities Behavior Pattern;Fitness;Time since onset of injury/illness/exacerbation;Education;Past/Current Experience    Examination-Activity Limitations Reach Overhead;Other   difficulty with everyday activities such as vacuuming.   Examination-Participation Restrictions Other;Interpersonal Relationship;Community Activity;Cleaning   eels embarrassed to be seen in public or any social situation   Stability/Clinical Decision Making Evolving/Moderate complexity    Rehab Potential Good    PT Frequency 2x / week    PT Duration 12 weeks    PT  Treatment/Interventions ADLs/Self Care Home Management;Cryotherapy;Moist Heat;Cognitive remediation;Neuromuscular re-education;Balance training;Therapeutic exercise;Therapeutic activities;Patient/family education;Manual techniques;Passive range of motion;Dry needling    PT Next Visit Plan update HEP as appropriate, FOTO, assess balance, postural strengthening, stretching, motor control    PT Home Exercise Plan hooklying laying as flat as possible 15 min a day, wall scaption lift offs    Consulted and Agree with Plan of Care  Patient;Family member/caregiver    Family Member Consulted Husband [name]             Patient will benefit from skilled therapeutic intervention in order to improve the following deficits and impairments:  Decreased cognition, Increased fascial restricitons, Improper body mechanics, Pain, Postural dysfunction, Decreased mobility, Decreased activity tolerance, Decreased endurance, Decreased range of motion, Decreased strength, Hypomobility, Impaired perceived functional ability, Impaired flexibility, Decreased balance  Visit Diagnosis: Abnormal posture  Muscle weakness (generalized)  Chronic bilateral low back pain without sciatica     Problem List Patient Active Problem List   Diagnosis Date Noted   Change in bowel habits 11/23/2020   Hyperlipidemia 11/20/2017   GERD without esophagitis 02/21/2017   Tachycardia 02/20/2016   RLS (restless legs syndrome) 12/21/2015   IBS (irritable bowel syndrome) 12/21/2015   Other long term (current) drug therapy 12/08/2015   Constipation 12/08/2015   Insomnia, persistent 12/08/2015   Dermatitis, eczematoid 12/08/2015   Gravida 2 para 2 12/08/2015   Hypertriglyceridemia 45/85/9292   Metallic taste 44/62/8638   Female climacteric state 12/08/2015   Schizoaffective disorder, bipolar type ([place]) 12/08/2015    Everlean Alstrom. Graylon Good, PT, DPT 12/06/21, 7:20 PM  [place] [facility] [address] [place], [address] Phone: [phone]   Fax:  [phone]  Name: Carla Thompson MRN: [id number] Date of Birth: 1961-06-13

## 2021-12-11 ENCOUNTER — Ambulatory Visit: Payer: 59 | Admitting: Physical Therapy

## 2021-12-11 ENCOUNTER — Encounter: Payer: Self-pay | Admitting: Physical Therapy

## 2021-12-11 DIAGNOSIS — G8929 Other chronic pain: Secondary | ICD-10-CM

## 2021-12-11 DIAGNOSIS — R293 Abnormal posture: Secondary | ICD-10-CM | POA: Diagnosis not present

## 2021-12-11 DIAGNOSIS — M6281 Muscle weakness (generalized): Secondary | ICD-10-CM

## 2021-12-11 DIAGNOSIS — M545 Low back pain, unspecified: Secondary | ICD-10-CM

## 2021-12-11 NOTE — Therapy (Signed)
[facility] [address], [place], [address] Phone: [phone]   Fax:  [phone]  Physical Therapy Treatment  Patient Details  Name: Carla Thompson MRN: [id number] Date of Birth: 10/29/1961 Referring Provider (PT): Steele Sizer, MD   Encounter Date: 12/11/2021   PT End of Session - 12/11/21 1800     Visit Number 4    Number of Visits 24    Date for PT Re-Evaluation 02/11/22    Authorization Type AETNA reporting period from 11/19/2021    Authorization Time Period 74 per cal yr    Authorization - Visit Number 4    Authorization - Number of Visits 30    Progress Note Due on Visit 10    PT Start Time 5093    PT Stop Time 1730    PT Time Calculation (min) 40 min    Activity Tolerance Patient tolerated treatment well    Behavior During Therapy [facility] for tasks assessed/performed   requires repeated explanation/education            Past Medical History:  Diagnosis Date   Abnormal perimenopausal bleeding    Bowel incontinence    Chronic constipation    Dermatitis    High risk medication use    Hyperglycemia    Hypertriglyceridemia    Iron deficiency anemia due to chronic blood loss    secondary to heavy flow   Metallic taste    Schizo-affective psychosis ([place])    Schizoaffective disorder, bipolar type ([place])    Dr. Tyrone Sage    Past Surgical History:  Procedure Laterality Date   ANUS SURGERY  2000   [place]   COLONOSCOPY     COLONOSCOPY WITH PROPOFOL N/A 11/30/2020   Procedure: COLONOSCOPY WITH PROPOFOL;  Surgeon: [name], MD;  [facility] ENDOSCOPY;  Service: Gastroenterology;  Laterality: N/A;   TUBAL LIGATION  12/02/1994    There were no vitals filed for this visit.   Subjective Assessment - 12/11/21 1652     Subjective Patient arrives with husband accompanying her today. She states she took her pillow out while doing her supine stretches at home and she is  tolerating that okay. She states she is feeling about the same. She went to bible study today. She has no pain in her neck currently.    Pertinent History Patient is a 61 y.o. female who presents to outpatient physical therapy with a referral for medical diagnosis neck muscle weakness, other kyphosis of cervical region. This patient's chief complaints consist of poor posture and inability to keep head and upper back in upright position, low back pain, leading to the following functional deficits: feels embarrassed to be seen in public or any social situation, difficulty with everyday activities such as vacuuming.  Relevant past medical history and comorbidities include schizoaffective disorder bipolar type, tachycardia, GERD, IBS, insomnia, bowel incontinence, chronic constipation.  Patient denies hx of cancer, stroke, seizures, lung problem, major cardiac events aside from tachycardia, diabetes, unexplained weight loss, stumbling or dropping things worsening lately.    Limitations Other (comment);House hold activities   Functional Limitations: Neck/posture it is embarrassing to go out in public or participate in any social activities.     Back pain limits normal everyday activities, vacuuming,   Diagnostic tests cervical spine radiograph ordered per chart review (pt unaware)    Patient Stated Goals to get better. "I don't want to be a hunch back"    Currently in Pain? No/denies  OBJECTIVE  SELF-REPORTED FUNCTION FOTO score: 46/100 (neck questionnaire, assisted by husband [name] as needed)   TREATMENT:    Therapeutic exercise: to centralize symptoms and improve ROM, strength, muscular endurance, and activity tolerance required for successful completion of functional activities.  - dependant position sitting ball toss to/from husband from side of table while PT stands next to patient to cue posture and for safety. Ball to stay at shoulder height or above to improve reaching and  postural extensor use. Lightweight green ball.  - standing facing wall light green ball tosses to hit the wall where sticky notes with various letters are placed on wall with additional time spent looking at the letters and trying to make words out of them before tossing ball to hit the appropriate letters. To improve postural strength and endurance.  - standing facing wall shoulder extension hand lift offs (alternating sides), 3x10 each side good quality, cuing  to look for hands and with sticky note in front of face to help keep head up. PT shaking alternating hands for patient to target to improve form.  - standing facing wall overhead press with 3#DB in each hand, 3x10 each side, cuing to hit target on wall overhead (sticky note).  - seated thoracic extension with towel roll behind upper back, hands clasped behind head. 3x20   Manual therapy: to reduce pain and tissue tension, improve range of motion, neuromodulation, in order to promote improved ability to complete functional activities. SUPINE progressing to SUPINE with towel roll along thoracic spine for deeper stretch - CPA grade III upglides with rotation, 1x5 each segement each side - cervical spine upglides grades III-IV bilaterally at each segment as tolerated (5-10 reps) - STM to B UT and posterior cervical spine musculature - PROM scapular retraction stretch, 5 second holds, 1x10 - gentle intermittent cervical distraction   Pt required multimodal cuing for proper technique and to facilitate improved neuromuscular control, strength, range of motion, and functional ability resulting in improved performance and form. Patient required target objects and task related cuing to improve posture due to difficulty performing exercises voluntarily.   HOME EXERCISE PROGRAM Access Code: [email or web address] URL: [email or web address] Date: 12/06/2021 Prepared by: Rosita Kea   Exercises Seated Thoracic Lumbar Extension with Pectoralis  Stretch - 1 x daily - 3 sets - 20 reps Laying supine with very thin or no pillow 2x15 min each day.  HOME EXERCISE PROGRAM [[id number]]   Wall Slides with Lift Off -  Repeat 10 Times, Complete 3 Sets, Perform 2 Times a Day   PT Education - 12/11/21 1800     Education Details Exercise purpose/form. Self management techniques.    Person(s) Educated Patient;Spouse    Methods Explanation;Demonstration;Tactile cues;Verbal cues    Comprehension Returned demonstration;Verbalized understanding;Verbal cues required;Tactile cues required;Need further instruction              PT Short Term Goals - 11/20/21 1023       PT SHORT TERM GOAL #1   Title Be independent with initial home exercise program for self-management of symptoms.    Baseline Initial HEP provided at IE (11/19/2021);    Time 2    Period Weeks    Status New    Target Date 12/04/21               PT Long Term Goals - 11/20/21 1024       PT LONG TERM GOAL #1   Title Be independent with a long-term home exercise program for self-management of symptoms.  Baseline Initial HEP provided at IE (11/19/2021);    Time 12    Period Weeks    Status New   TARGET DATE FOR ALL LONG TERM GOALS: 02/11/2022     PT LONG TERM GOAL #2   Title Demonstrate improved FOTO score by 10 units to demonstrate improvement in overall condition and self-reported functional ability.    Baseline to be assessed visit 2 as appropriate (11/19/2021);    Time 12    Period Weeks    Status New      PT LONG TERM GOAL #3   Title Have full cervical spine AROM with no compensations outside of those expected from scoliosis or increase in pain in all planes except intermittent end range discomfort to improve ability  to maintain healthy posture and complete valued community activities with confidence such as grocery shopping.    Baseline Stiff and weak - unalbe to maintain appropriate posture -  see objective exam (11/19/2021);    Time 12    Period Weeks     Status New      PT LONG TERM GOAL #4   Title Paient will demonstrate ability to maintain upright posture for equal or greater than 30 min during PT sessions to demonstrate improved postural awareness, strength, endurance and motor control for improved function in public/social places including grocery shopping or visiting family.    Baseline unable to keep head up for more than a few seconds (11/19/2021);    Time 12    Period Weeks    Status New      PT LONG TERM GOAL #5   Title Complete community, work and/or recreational activities without limitation due to current condition.    Baseline Neck/posture it is embarrassing to go out in public or participate in any social activities.     Back pain limits normal everyday activities, vacuuming (11/19/2021);    Time 12    Period Weeks    Status New                   Plan - 12/11/21 1808     Clinical Impression Statement Patient tolerated treatment well this session with heavy task oriented cuing to help her improve postural strength, endurance, and mobility. Patient found manual therapy uncomfortable but did not continue to complain of pain throughout rest of session. Patient's posture quickly goes back to being poor without almost constant cuing to perform tasks that improve it and she has limited ability to respond to cues to improve posture direction. Patient would benefit from continued management of limiting condition by skilled physical therapist to address remaining impairments and functional limitations to work towards stated goals and return to PLOF or maximal functional independence.    Personal Factors and Comorbidities Behavior Pattern;Fitness;Time since onset of injury/illness/exacerbation;Education;Past/Current Experience    Examination-Activity Limitations Reach Overhead;Other   difficulty with everyday activities such as vacuuming.   Examination-Participation Restrictions Other;Interpersonal Relationship;Community  Activity;Cleaning   eels embarrassed to be seen in public or any social situation   Stability/Clinical Decision Making Evolving/Moderate complexity    Rehab Potential Good    PT Frequency 2x / week    PT Duration 12 weeks    PT Treatment/Interventions ADLs/Self Care Home Management;Cryotherapy;Moist Heat;Cognitive remediation;Neuromuscular re-education;Balance training;Therapeutic exercise;Therapeutic activities;Patient/family education;Manual techniques;Passive range of motion;Dry needling    PT Next Visit Plan update HEP as appropriate, FOTO, assess balance, postural strengthening, stretching, motor control    PT Home Exercise Plan hooklying laying as flat as possible 15 min  a day, wall scaption lift offs    Consulted and Agree with Plan of Care Patient;Family member/caregiver    Family Member Consulted Husband [name]             Patient will benefit from skilled therapeutic intervention in order to improve the following deficits and impairments:  Decreased cognition, Increased fascial restricitons, Improper body mechanics, Pain, Postural dysfunction, Decreased mobility, Decreased activity tolerance, Decreased endurance, Decreased range of motion, Decreased strength, Hypomobility, Impaired perceived functional ability, Impaired flexibility, Decreased balance  Visit Diagnosis: Abnormal posture  Muscle weakness (generalized)  Chronic bilateral low back pain without sciatica     Problem List Patient Active Problem List   Diagnosis Date Noted   Change in bowel habits 11/23/2020   Hyperlipidemia 11/20/2017   GERD without esophagitis 02/21/2017   Tachycardia 02/20/2016   RLS (restless legs syndrome) 12/21/2015   IBS (irritable bowel syndrome) 12/21/2015   Other long term (current) drug therapy 12/08/2015   Constipation 12/08/2015   Insomnia, persistent 12/08/2015   Dermatitis, eczematoid 12/08/2015   Gravida 2 para 2 12/08/2015   Hypertriglyceridemia 48/12/6551   Metallic  taste 74/82/7078   Female climacteric state 12/08/2015   Schizoaffective disorder, bipolar type ([place]) 12/08/2015    Everlean Alstrom. Graylon Good, PT, DPT 12/11/21, 6:09 PM   [facility] [address], [place], [address] Phone: [phone]   Fax:  [phone]  Name: Carla Thompson MRN: [id number] Date of Birth: 12-23-1960

## 2021-12-13 ENCOUNTER — Encounter: Payer: Self-pay | Admitting: Physical Therapy

## 2021-12-13 ENCOUNTER — Ambulatory Visit: Payer: 59 | Admitting: Physical Therapy

## 2021-12-13 DIAGNOSIS — M6281 Muscle weakness (generalized): Secondary | ICD-10-CM

## 2021-12-13 DIAGNOSIS — M545 Low back pain, unspecified: Secondary | ICD-10-CM

## 2021-12-13 DIAGNOSIS — R293 Abnormal posture: Secondary | ICD-10-CM

## 2021-12-13 DIAGNOSIS — G8929 Other chronic pain: Secondary | ICD-10-CM

## 2021-12-13 NOTE — Therapy (Signed)
[facility] [address], [place], [address] Phone: [phone]   Fax:  [phone]  Physical Therapy Treatment  Patient Details  Name: Carla Thompson MRN: [id number] Date of Birth: [birth year] Referring Provider (PT): Carla Sizer, MD   Encounter Date: 12/13/2021   PT End of Session - 12/13/21 1800     Visit Number 5    Number of Visits 24    Date for PT Re-Evaluation 02/11/22    Authorization Type AETNA reporting period from 11/19/2021    Authorization Time Period 43 per cal yr    Authorization - Visit Number 5    Authorization - Number of Visits 30    Progress Note Due on Visit 10    PT Start Time 5916    PT Stop Time 1735    PT Time Calculation (min) 45 min    Activity Tolerance Patient tolerated treatment well    Behavior During Therapy [facility] for tasks assessed/performed   requires repeated explanation/education            Past Medical History:  Diagnosis Date   Abnormal perimenopausal bleeding    Bowel incontinence    Chronic constipation    Dermatitis    High risk medication use    Hyperglycemia    Hypertriglyceridemia    Iron deficiency anemia due to chronic blood loss    secondary to heavy flow   Metallic taste    Schizo-affective psychosis ([place])    Schizoaffective disorder, bipolar type ([place])    Dr. Tyrone Thompson    Past Surgical History:  Procedure Laterality Date   ANUS SURGERY  2000   [place]   COLONOSCOPY     COLONOSCOPY WITH PROPOFOL N/A 11/30/2020   Procedure: COLONOSCOPY WITH PROPOFOL;  Surgeon: Carla Bellows, MD;  Location: [facility] ENDOSCOPY;  Service: Gastroenterology;  Laterality: N/A;   TUBAL LIGATION  12/02/1994    There were no vitals filed for this visit.   Subjective Assessment - 12/13/21 1758     Subjective Patient arrives with her husband Carla Thompson who accompanied her throughout the session. She reports her neck was a bit sore after last PT session but  she has no current pain. She states someone at church noticed her posture had improved since starting PT.    Pertinent History Patient is a 61 y.o. female who presents to outpatient physical therapy with a referral for medical diagnosis neck muscle weakness, other kyphosis of cervical region. This patient's chief complaints consist of poor posture and inability to keep head and upper back in upright position, low back pain, leading to the following functional deficits: feels embarrassed to be seen in public or any social situation, difficulty with everyday activities such as vacuuming.  Relevant past medical history and comorbidities include schizoaffective disorder bipolar type, tachycardia, GERD, IBS, insomnia, bowel incontinence, chronic constipation.  Patient denies hx of cancer, stroke, seizures, lung problem, major cardiac events aside from tachycardia, diabetes, unexplained weight loss, stumbling or dropping things worsening lately.    Limitations Other (comment);House hold activities   Functional Limitations: Neck/posture it is embarrassing to go out in public or participate in any social activities.     Back pain limits normal everyday activities, vacuuming,   Diagnostic tests cervical spine radiograph ordered per chart review (pt unaware)    Patient Stated Goals to get better. "I don't want to be a hunch back"    Currently in Pain? No/denies  TREATMENT:    Therapeutic exercise: to centralize symptoms and improve ROM, strength, muscular endurance, and activity tolerance required for successful completion of functional activities.  - standing retrocycle on upper body ergometer at level 7 while visually scanning outdoors over the top of UBE to find letters in the community signs (found all except J, Q, and Z). To work postural muscles to improve postural awareness and endurance. ~ 10 min.  - standing facing wall shoulder extension hand lift offs (alternating sides), 3x10  each side Thompson quality, cuing  to look for hands and with sticky note in front of face to help keep head up. PT shaking alternating hands for patient to target to improve form. - seated thoracic extension with towel roll behind upper back, hands clasped behind head. 3x20 - standing facing wall light green ball tosses to hit the wall where sticky notes with various letters are placed on wall with additional time spent looking at the letters and trying to make words out of them before tossing ball to hit the appropriate letters. To improve postural strength and endurance.   - standing facing wall overhead press with 3#DB in each hand, 3x10 each side, cuing to hit target on wall overhead (sticky note).   Manual therapy: to reduce pain and tissue tension, improve range of motion, neuromodulation, in order to promote improved ability to complete functional activities. SUPINE progressing to SUPINE with/without towel roll along thoracic spine for deeper stretch - CPA grade III upglides with rotation, 1x5 each segement each side - STM to B UT and posterior cervical spine musculature - PROM scapular retraction stretch, 5 second holds, 1x10 - gentle intermittent cervical distraction - PROM cervical retraction with gentle clinician overpressure, 2x10   Pt required multimodal cuing for proper technique and to facilitate improved neuromuscular control, strength, range of motion, and functional ability resulting in improved performance and form. Patient required target objects and task related cuing to improve posture due to difficulty performing exercises voluntarily.   HOME EXERCISE PROGRAM Access Code: [email or web address] URL: [email or web address] Date: 12/06/2021 Prepared by: Carla Thompson   Exercises Seated Thoracic Lumbar Extension with Pectoralis Stretch - 1 x daily - 3 sets - 20 reps Laying supine with very thin or no pillow 2x15 min each day.  HOME EXERCISE PROGRAM [[id number]]   Wall Slides with  Lift Off -  Repeat 10 Times, Complete 3 Sets, Perform 2 Times a Day     PT Education - 12/13/21 1800     Education Details Exercise purpose/form. Self management techniques    Person(s) Educated Patient    Methods Explanation;Demonstration;Tactile cues;Verbal cues    Comprehension Verbalized understanding;Returned demonstration;Verbal cues required;Tactile cues required;Need further instruction              PT Short Term Goals - 12/13/21 1807       PT SHORT TERM GOAL #1   Title Be independent with initial home exercise program for self-management of symptoms.    Baseline Initial HEP provided at IE (11/19/2021);    Time 2    Period Weeks    Status Achieved    Target Date 12/04/21               PT Long Term Goals - 11/20/21 1024       PT LONG TERM GOAL #1   Title Be independent with a long-term home exercise program for self-management of symptoms.    Baseline Initial HEP provided at IE (11/19/2021);    Time 12  Period Weeks    Status New   TARGET DATE FOR ALL LONG TERM GOALS: 02/11/2022     PT LONG TERM GOAL #2   Title Demonstrate improved FOTO score by 10 units to demonstrate improvement in overall condition and self-reported functional ability.    Baseline to be assessed visit 2 as appropriate (11/19/2021);    Time 12    Period Weeks    Status New      PT LONG TERM GOAL #3   Title Have full cervical spine AROM with no compensations outside of those expected from scoliosis or increase in pain in all planes except intermittent end range discomfort to improve ability  to maintain healthy posture and complete valued community activities with confidence such as grocery shopping.    Baseline Stiff and weak - unalbe to maintain appropriate posture -  see objective exam (11/19/2021);    Time 12    Period Weeks    Status New      PT LONG TERM GOAL #4   Title Paient will demonstrate ability to maintain upright posture for equal or greater than 30 min during PT  sessions to demonstrate improved postural awareness, strength, endurance and motor control for improved function in public/social places including grocery shopping or visiting family.    Baseline unable to keep head up for more than a few seconds (11/19/2021);    Time 12    Period Weeks    Status New      PT LONG TERM GOAL #5   Title Complete community, work and/or recreational activities without limitation due to current condition.    Baseline Neck/posture it is embarrassing to go out in public or participate in any social activities.     Back pain limits normal everyday activities, vacuuming (11/19/2021);    Time 12    Period Weeks    Status New                   Plan - 12/13/21 1806     Clinical Impression Statement Patient continue to demonstrate significant joint stiffness that negatively affects posture but improves with supine posture and manual therapy. Patient finds upglides and joint mobilizations uncomfortable but is able to tolerate. Session continued working on activities to promote improved postural mobility, muscle performance, and awareness. Due to cognitive and motor control limitations, patient requires constant assistance to perform exercises properly and is best able to complete outcome based activities and cues instead of process based or form corrections. Patient would benefit from continued management of limiting condition by skilled physical therapist to address remaining impairments and functional limitations to work towards stated goals and return to PLOF or maximal functional independence.    Personal Factors and Comorbidities Behavior Pattern;Fitness;Time since onset of injury/illness/exacerbation;Education;Past/Current Experience    Examination-Activity Limitations Reach Overhead;Other   difficulty with everyday activities such as vacuuming.   Examination-Participation Restrictions Other;Interpersonal Relationship;Community Activity;Cleaning   eels  embarrassed to be seen in public or any social situation   Stability/Clinical Decision Making Evolving/Moderate complexity    Rehab Potential Thompson    PT Frequency 2x / week    PT Duration 12 weeks    PT Treatment/Interventions ADLs/Self Care Home Management;Cryotherapy;Moist Heat;Cognitive remediation;Neuromuscular re-education;Balance training;Therapeutic exercise;Therapeutic activities;Patient/family education;Manual techniques;Passive range of motion;Dry needling    PT Next Visit Plan update HEP as appropriate, FOTO, assess balance, postural strengthening, stretching, motor control    PT Home Exercise Plan hooklying laying as flat as possible 15 min a day, wall scaption lift  offs    Consulted and Agree with Plan of Care Patient;Family member/caregiver    Family Member Consulted Husband Carla Thompson             Patient will benefit from skilled therapeutic intervention in order to improve the following deficits and impairments:  Decreased cognition, Increased fascial restricitons, Improper body mechanics, Pain, Postural dysfunction, Decreased mobility, Decreased activity tolerance, Decreased endurance, Decreased range of motion, Decreased strength, Hypomobility, Impaired perceived functional ability, Impaired flexibility, Decreased balance  Visit Diagnosis: Abnormal posture  Muscle weakness (generalized)  Chronic bilateral low back pain without sciatica     Problem List Patient Active Problem List   Diagnosis Date Noted   Change in bowel habits 11/23/2020   Hyperlipidemia 11/20/2017   GERD without esophagitis 02/21/2017   Tachycardia 02/20/2016   RLS (restless legs syndrome) 12/21/2015   IBS (irritable bowel syndrome) 12/21/2015   Other long term (current) drug therapy 12/08/2015   Constipation 12/08/2015   Insomnia, persistent 12/08/2015   Dermatitis, eczematoid 12/08/2015   Gravida 2 para 2 12/08/2015   Hypertriglyceridemia 64/33/2951   Metallic taste 88/41/6606   Female  climacteric state 12/08/2015   Schizoaffective disorder, bipolar type ([place]) 12/08/2015    Carla Thompson, PT, DPT 12/13/21, 6:07 PM   [facility] [address], [place], [address] Phone: [phone]   Fax:  [phone]  Name: Carla Thompson MRN: [id number] Date of Birth: 1961/11/11

## 2021-12-18 ENCOUNTER — Ambulatory Visit: Payer: 59 | Admitting: Physical Therapy

## 2021-12-18 DIAGNOSIS — R293 Abnormal posture: Secondary | ICD-10-CM | POA: Diagnosis not present

## 2021-12-18 DIAGNOSIS — G8929 Other chronic pain: Secondary | ICD-10-CM

## 2021-12-18 DIAGNOSIS — M6281 Muscle weakness (generalized): Secondary | ICD-10-CM

## 2021-12-19 ENCOUNTER — Encounter: Payer: Self-pay | Admitting: Physical Therapy

## 2021-12-19 NOTE — Therapy (Signed)
[facility] [address], [place], [address] Phone: [phone]   Fax:  [phone]  Physical Therapy Treatment  Patient Details  Name: ALAJIAH DUTKIEWICZ MRN: [id number] Date of Birth: [birth year] Referring Provider (PT): Steele Sizer, MD   Encounter Date: 12/18/2021   PT End of Session - 12/19/21 1456     Visit Number 6    Number of Visits 24    Date for PT Re-Evaluation 02/11/22    Authorization Type AETNA reporting period from 11/19/2021    Authorization Time Period 9 per cal yr    Authorization - Visit Number 6    Authorization - Number of Visits 30    Progress Note Due on Visit 10    PT Start Time 8768    PT Stop Time 1730    PT Time Calculation (min) 40 min    Activity Tolerance Patient tolerated treatment well    Behavior During Therapy [facility] for tasks assessed/performed   requires repeated explanation/education            Past Medical History:  Diagnosis Date   Abnormal perimenopausal bleeding    Bowel incontinence    Chronic constipation    Dermatitis    High risk medication use    Hyperglycemia    Hypertriglyceridemia    Iron deficiency anemia due to chronic blood loss    secondary to heavy flow   Metallic taste    Schizo-affective psychosis ([place])    Schizoaffective disorder, bipolar type ([place])    Dr. Tyrone Sage    Past Surgical History:  Procedure Laterality Date   ANUS SURGERY  2000   [place]   COLONOSCOPY     COLONOSCOPY WITH PROPOFOL N/A 11/30/2020   Procedure: COLONOSCOPY WITH PROPOFOL;  Surgeon: [name], MD;  Location: [facility] ENDOSCOPY;  Service: Gastroenterology;  Laterality: N/A;   TUBAL LIGATION  12/02/1994    There were no vitals filed for this visit.   Subjective Assessment - 12/19/21 1454     Subjective Patient arrives accompanied by her husband [name] who participated throughout the session. She reports she had some soreness after last session.  Continues to report attempts to complete HEP and that people in her life have noticed her posture is better.    Pertinent History Patient is a 61 y.o. female who presents to outpatient physical therapy with a referral for medical diagnosis neck muscle weakness, other kyphosis of cervical region. This patient's chief complaints consist of poor posture and inability to keep head and upper back in upright position, low back pain, leading to the following functional deficits: feels embarrassed to be seen in public or any social situation, difficulty with everyday activities such as vacuuming.  Relevant past medical history and comorbidities include schizoaffective disorder bipolar type, tachycardia, GERD, IBS, insomnia, bowel incontinence, chronic constipation.  Patient denies hx of cancer, stroke, seizures, lung problem, major cardiac events aside from tachycardia, diabetes, unexplained weight loss, stumbling or dropping things worsening lately.    Limitations Other (comment);House hold activities   Functional Limitations: Neck/posture it is embarrassing to go out in public or participate in any social activities.     Back pain limits normal everyday activities, vacuuming,   Diagnostic tests cervical spine radiograph ordered per chart review (pt unaware)    Patient Stated Goals to get better. "I don't want to be a hunch back"    Currently in Pain? No/denies  TREATMENT:    Therapeutic exercise: to centralize symptoms and improve ROM, strength, muscular endurance, and activity tolerance required for successful completion of functional activities.  - standing retrocycle on upper body ergometer at level 7 with intermittent cuing to improve or maintain upright posture. To work postural muscles to improve postural awareness and endurance. ~  min.  - seated thoracic extension with towel roll behind upper back, hands clasped behind head. 3x20 - standing doorway pec stretch, 3x30 seconds,  -  half dead lift to overhead press, tapping weights on stool at mid shin, 3x10 holding one 3#DB in each hand. Constant cuing to stand upright and look up. - standing facing wall light green ball tosses to hit the wall where sticky notes with various letters are placed on wall with additional time spent looking at the letters and trying to make words out of them before tossing ball to hit the appropriate letters. To improve postural strength and endurance.    Manual therapy: to reduce pain and tissue tension, improve range of motion, neuromodulation, in order to promote improved ability to complete functional activities. SUPINE progressing to SUPINE with towel roll along thoracic spine for deeper stretch - PROM scapular retraction stretch, 5 second holds, 1x10 - gentle intermittent cervical distraction - PROM cervical retraction with gentle clinician overpressure, 2x10   Pt required multimodal cuing for proper technique and to facilitate improved neuromuscular control, strength, range of motion, and functional ability resulting in improved performance and form. Patient required target objects and task related cuing to improve posture due to difficulty performing exercises voluntarily.   HOME EXERCISE PROGRAM Access Code: [email or web address] URL: [email or web address] Date: 12/06/2021 Prepared by: Rosita Kea   Exercises Seated Thoracic Lumbar Extension with Pectoralis Stretch - 1 x daily - 3 sets - 20 reps Laying supine with very thin or no pillow 2x15 min each day.  HOME EXERCISE PROGRAM [[id number]]   Wall Slides with Lift Off -  Repeat 10 Times, Complete 3 Sets, Perform 2 Times a Day    PT Education - 12/19/21 1455     Education Details Exercise purpose/form. Self management techniques    Person(s) Educated Patient    Methods Explanation;Demonstration;Tactile cues;Verbal cues    Comprehension Verbalized understanding;Returned demonstration;Verbal cues required;Tactile cues required;Need  further instruction              PT Short Term Goals - 12/13/21 1807       PT SHORT TERM GOAL #1   Title Be independent with initial home exercise program for self-management of symptoms.    Baseline Initial HEP provided at IE (11/19/2021);    Time 2    Period Weeks    Status Achieved    Target Date 12/04/21               PT Long Term Goals - 11/20/21 1024       PT LONG TERM GOAL #1   Title Be independent with a long-term home exercise program for self-management of symptoms.    Baseline Initial HEP provided at IE (11/19/2021);    Time 12    Period Weeks    Status New   TARGET DATE FOR ALL LONG TERM GOALS: 02/11/2022     PT LONG TERM GOAL #2   Title Demonstrate improved FOTO score by 10 units to demonstrate improvement in overall condition and self-reported functional ability.    Baseline to be assessed visit 2 as appropriate (11/19/2021);    Time 12    Period Weeks  Status New      PT LONG TERM GOAL #3   Title Have full cervical spine AROM with no compensations outside of those expected from scoliosis or increase in pain in all planes except intermittent end range discomfort to improve ability  to maintain healthy posture and complete valued community activities with confidence such as grocery shopping.    Baseline Stiff and weak - unalbe to maintain appropriate posture -  see objective exam (11/19/2021);    Time 12    Period Weeks    Status New      PT LONG TERM GOAL #4   Title Paient will demonstrate ability to maintain upright posture for equal or greater than 30 min during PT sessions to demonstrate improved postural awareness, strength, endurance and motor control for improved function in public/social places including grocery shopping or visiting family.    Baseline unable to keep head up for more than a few seconds (11/19/2021);    Time 12    Period Weeks    Status New      PT LONG TERM GOAL #5   Title Complete community, work and/or recreational  activities without limitation due to current condition.    Baseline Neck/posture it is embarrassing to go out in public or participate in any social activities.     Back pain limits normal everyday activities, vacuuming (11/19/2021);    Time 12    Period Weeks    Status New                   Plan - 12/19/21 1524     Clinical Impression Statement Patient tolerated treatment well overall and continues to work towards improving postural awareness, endurance, strength, and mobility. Patient continues to require almost constant cuing for posture, focus, and form. Patient would benefit from continued management of limiting condition by skilled physical therapist to address remaining impairments and functional limitations to work towards stated goals and return to PLOF or maximal functional independence.    Personal Factors and Comorbidities Behavior Pattern;Fitness;Time since onset of injury/illness/exacerbation;Education;Past/Current Experience    Examination-Activity Limitations Reach Overhead;Other   difficulty with everyday activities such as vacuuming.   Examination-Participation Restrictions Other;Interpersonal Relationship;Community Activity;Cleaning   eels embarrassed to be seen in public or any social situation   Stability/Clinical Decision Making Evolving/Moderate complexity    Rehab Potential Good    PT Frequency 2x / week    PT Duration 12 weeks    PT Treatment/Interventions ADLs/Self Care Home Management;Cryotherapy;Moist Heat;Cognitive remediation;Neuromuscular re-education;Balance training;Therapeutic exercise;Therapeutic activities;Patient/family education;Manual techniques;Passive range of motion;Dry needling    PT Next Visit Plan update HEP as appropriate, FOTO, assess balance, postural strengthening, stretching, motor control    PT Home Exercise Plan hooklying laying as flat as possible 15 min a day, wall scaption lift offs    Consulted and Agree with Plan of Care  Patient;Family member/caregiver    Family Member Consulted Husband [name]             Patient will benefit from skilled therapeutic intervention in order to improve the following deficits and impairments:  Decreased cognition, Increased fascial restricitons, Improper body mechanics, Pain, Postural dysfunction, Decreased mobility, Decreased activity tolerance, Decreased endurance, Decreased range of motion, Decreased strength, Hypomobility, Impaired perceived functional ability, Impaired flexibility, Decreased balance  Visit Diagnosis: Abnormal posture  Muscle weakness (generalized)  Chronic bilateral low back pain without sciatica     Problem List Patient Active Problem List   Diagnosis Date Noted   Change in bowel  habits 11/23/2020   Hyperlipidemia 11/20/2017   GERD without esophagitis 02/21/2017   Tachycardia 02/20/2016   RLS (restless legs syndrome) 12/21/2015   IBS (irritable bowel syndrome) 12/21/2015   Other long term (current) drug therapy 12/08/2015   Constipation 12/08/2015   Insomnia, persistent 12/08/2015   Dermatitis, eczematoid 12/08/2015   Gravida 2 para 2 12/08/2015   Hypertriglyceridemia 01/24/3611   Metallic taste 24/49/7530   Female climacteric state 12/08/2015   Schizoaffective disorder, bipolar type ([place]) 12/08/2015    Everlean Alstrom. Graylon Good, PT, DPT 12/19/21, 3:25 PM  [facility] [address] [place], [address] Phone: [phone]   Fax:  [phone]  Name: AADYA KINDLER MRN: [id number] Date of Birth: [birth year]

## 2021-12-20 ENCOUNTER — Ambulatory Visit: Payer: 59 | Admitting: Physical Therapy

## 2021-12-20 ENCOUNTER — Encounter: Payer: Self-pay | Admitting: Physical Therapy

## 2021-12-20 DIAGNOSIS — R293 Abnormal posture: Secondary | ICD-10-CM | POA: Diagnosis not present

## 2021-12-20 DIAGNOSIS — M6281 Muscle weakness (generalized): Secondary | ICD-10-CM

## 2021-12-20 DIAGNOSIS — G8929 Other chronic pain: Secondary | ICD-10-CM

## 2021-12-20 DIAGNOSIS — M545 Low back pain, unspecified: Secondary | ICD-10-CM

## 2021-12-20 NOTE — Therapy (Signed)
[facility] [address], [place], [address] Phone: [phone]   Fax:  [phone]  Physical Therapy Treatment  Patient Details  Name: Carla Thompson MRN: [id number] Date of Birth: 11/02/1961 Referring Provider (PT): Steele Sizer, MD   Encounter Date: 12/20/2021   PT End of Session - 12/20/21 2220     Visit Number 7    Number of Visits 24    Date for PT Re-Evaluation 02/11/22    Authorization Type AETNA reporting period from 11/19/2021    Authorization Time Period 16 per cal yr    Authorization - Visit Number 7    Authorization - Number of Visits 30    Progress Note Due on Visit 10    PT Start Time 6789    PT Stop Time 1730    PT Time Calculation (min) 40 min    Activity Tolerance Patient tolerated treatment well    Behavior During Therapy [facility] for tasks assessed/performed   requires repeated explanation/education            Past Medical History:  Diagnosis Date   Abnormal perimenopausal bleeding    Bowel incontinence    Chronic constipation    Dermatitis    High risk medication use    Hyperglycemia    Hypertriglyceridemia    Iron deficiency anemia due to chronic blood loss    secondary to heavy flow   Metallic taste    Schizo-affective psychosis ([place])    Schizoaffective disorder, bipolar type ([place])    Dr. Tyrone Sage    Past Surgical History:  Procedure Laterality Date   ANUS SURGERY  2000   [place]   COLONOSCOPY     COLONOSCOPY WITH PROPOFOL N/A 11/30/2020   Procedure: COLONOSCOPY WITH PROPOFOL;  Surgeon: [name], MD;  Location: [facility] ENDOSCOPY;  Service: Gastroenterology;  Laterality: N/A;   TUBAL LIGATION  12/02/1994    There were no vitals filed for this visit.   Subjective Assessment - 12/20/21 2219     Subjective Patient arrives with husband [name] who participates in session. She reports no pain upon arrival. States she had some pain in her shoulders she  attributes to pec stretch after last PT session.    Pertinent History Patient is a 61 y.o. female who presents to outpatient physical therapy with a referral for medical diagnosis neck muscle weakness, other kyphosis of cervical region. This patient's chief complaints consist of poor posture and inability to keep head and upper back in upright position, low back pain, leading to the following functional deficits: feels embarrassed to be seen in public or any social situation, difficulty with everyday activities such as vacuuming.  Relevant past medical history and comorbidities include schizoaffective disorder bipolar type, tachycardia, GERD, IBS, insomnia, bowel incontinence, chronic constipation.  Patient denies hx of cancer, stroke, seizures, lung problem, major cardiac events aside from tachycardia, diabetes, unexplained weight loss, stumbling or dropping things worsening lately.    Limitations Other (comment);House hold activities   Functional Limitations: Neck/posture it is embarrassing to go out in public or participate in any social activities.     Back pain limits normal everyday activities, vacuuming,   Diagnostic tests cervical spine radiograph ordered per chart review (pt unaware)    Patient Stated Goals to get better. "I don't want to be a hunch back"    Currently in Pain? No/denies              TREATMENT:    Therapeutic exercise:  to centralize symptoms and improve ROM, strength, muscular endurance, and activity tolerance required for successful completion of functional activities.  - standing retrocycle on upper body ergometer at level 7 with intermittent cuing to improve or maintain upright posture. To work postural muscles to improve postural awareness and endurance. ~  5min.  - standing with back to the wall theraball taps overhead, 3x10 - seated thoracic extension with towel roll behind upper back, hands clasped behind head. 3x10 - half dead lift to overhead press, tapping  weights on stool at mid shin, 3x10 holding one 3#DB in each hand. Constant cuing to stand upright and look up. - standing facing wall light green ball tosses to hit the wall where sticky notes with various letters are placed on wall with additional time spent looking at the letters and trying to make words out of them before tossing ball to hit the appropriate letters. To improve postural strength and endurance.  Made 10 words.  - Sidelying open book (thoracic rotation) to improve thoracic, shoulder girdle, and upper trunk mobility. 1x14 each side by passing cone from one side to the other to get as full a range of motion as possible and keep patient engaged.  - attempted supine horizontal shoulder abduction but unsuccessful due to difficulty following cuing so discontinued.    Pt required multimodal cuing for proper technique and to facilitate improved neuromuscular control, strength, range of motion, and functional ability resulting in improved performance and form. Patient required target objects and task related cuing to improve posture due to difficulty performing exercises voluntarily.   HOME EXERCISE PROGRAM Access Code: [email or web address] URL: [email or web address] Date: 12/06/2021 Prepared by: Rosita Kea   Exercises Seated Thoracic Lumbar Extension with Pectoralis Stretch - 1 x daily - 3 sets - 20 reps Laying supine with very thin or no pillow 2x15 min each day.  HOME EXERCISE PROGRAM [[id number]]   Wall Slides with Lift Off -  Repeat 10 Times, Complete 3 Sets, Perform 2 Times a Day    PT Education - 12/20/21 2220     Education Details Exercise purpose/form. Self management techniques    Person(s) Educated Patient    Methods Explanation;Demonstration;Tactile cues;Verbal cues    Comprehension Verbalized understanding;Returned demonstration;Verbal cues required;Tactile cues required;Need further instruction              PT Short Term Goals - 12/13/21 1807       PT SHORT  TERM GOAL #1   Title Be independent with initial home exercise program for self-management of symptoms.    Baseline Initial HEP provided at IE (11/19/2021);    Time 2    Period Weeks    Status Achieved    Target Date 12/04/21               PT Long Term Goals - 11/20/21 1024       PT LONG TERM GOAL #1   Title Be independent with a long-term home exercise program for self-management of symptoms.    Baseline Initial HEP provided at IE (11/19/2021);    Time 12    Period Weeks    Status New   TARGET DATE FOR ALL LONG TERM GOALS: 02/11/2022     PT LONG TERM GOAL #2   Title Demonstrate improved FOTO score by 10 units to demonstrate improvement in overall condition and self-reported functional ability.    Baseline to be assessed visit 2 as appropriate (11/19/2021);    Time 12    Period Weeks  Status New      PT LONG TERM GOAL #3   Title Have full cervical spine AROM with no compensations outside of those expected from scoliosis or increase in pain in all planes except intermittent end range discomfort to improve ability  to maintain healthy posture and complete valued community activities with confidence such as grocery shopping.    Baseline Stiff and weak - unalbe to maintain appropriate posture -  see objective exam (11/19/2021);    Time 12    Period Weeks    Status New      PT LONG TERM GOAL #4   Title Paient will demonstrate ability to maintain upright posture for equal or greater than 30 min during PT sessions to demonstrate improved postural awareness, strength, endurance and motor control for improved function in public/social places including grocery shopping or visiting family.    Baseline unable to keep head up for more than a few seconds (11/19/2021);    Time 12    Period Weeks    Status New      PT LONG TERM GOAL #5   Title Complete community, work and/or recreational activities without limitation due to current condition.    Baseline Neck/posture it is  embarrassing to go out in public or participate in any social activities.     Back pain limits normal everyday activities, vacuuming (11/19/2021);    Time 12    Period Weeks    Status New                   Plan - 12/20/21 2224     Clinical Impression Statement Patient tolerated treatment well and was abel to lay flat on plinth by end of session without manual therapy today. Continues to assume a stooped posture almost immediately without constant cuing or engagement to work on a task that requires the head up. Session continued to focus on improving postural strength, awareness, and mobility. Husband [name] accompanied to improve participating and ability to help her continue working on improved posture at home. Patient would benefit from continued management of limiting condition by skilled physical therapist to address remaining impairments and functional limitations to work towards stated goals and return to PLOF or maximal functional independence.    Personal Factors and Comorbidities Behavior Pattern;Fitness;Time since onset of injury/illness/exacerbation;Education;Past/Current Experience    Examination-Activity Limitations Reach Overhead;Other   difficulty with everyday activities such as vacuuming.   Examination-Participation Restrictions Other;Interpersonal Relationship;Community Activity;Cleaning   eels embarrassed to be seen in public or any social situation   Stability/Clinical Decision Making Evolving/Moderate complexity    Rehab Potential Good    PT Frequency 2x / week    PT Duration 12 weeks    PT Treatment/Interventions ADLs/Self Care Home Management;Cryotherapy;Moist Heat;Cognitive remediation;Neuromuscular re-education;Balance training;Therapeutic exercise;Therapeutic activities;Patient/family education;Manual techniques;Passive range of motion;Dry needling    PT Next Visit Plan update HEP as appropriate, FOTO, assess balance, postural strengthening, stretching, motor  control    PT Home Exercise Plan hooklying laying as flat as possible 15 min a day, wall scaption lift offs    Consulted and Agree with Plan of Care Patient;Family member/caregiver    Family Member Consulted Husband [name]             Patient will benefit from skilled therapeutic intervention in order to improve the following deficits and impairments:  Decreased cognition, Increased fascial restricitons, Improper body mechanics, Pain, Postural dysfunction, Decreased mobility, Decreased activity tolerance, Decreased endurance, Decreased range of motion, Decreased strength, Hypomobility, Impaired perceived  functional ability, Impaired flexibility, Decreased balance  Visit Diagnosis: Abnormal posture  Muscle weakness (generalized)  Chronic bilateral low back pain without sciatica     Problem List Patient Active Problem List   Diagnosis Date Noted   Change in bowel habits 11/23/2020   Hyperlipidemia 11/20/2017   GERD without esophagitis 02/21/2017   Tachycardia 02/20/2016   RLS (restless legs syndrome) 12/21/2015   IBS (irritable bowel syndrome) 12/21/2015   Other long term (current) drug therapy 12/08/2015   Constipation 12/08/2015   Insomnia, persistent 12/08/2015   Dermatitis, eczematoid 12/08/2015   Gravida 2 para 2 12/08/2015   Hypertriglyceridemia 16/55/3748   Metallic taste 27/06/8674   Female climacteric state 12/08/2015   Schizoaffective disorder, bipolar type ([place]) 12/08/2015    Everlean Alstrom. Graylon Good, PT, DPT 12/20/21, 10:25 PM   [facility] [address], [place], [address] Phone: [phone]   Fax:  [phone]  Name: Carla Thompson MRN: [id number] Date of Birth: Jun 06, 1961

## 2021-12-25 ENCOUNTER — Other Ambulatory Visit: Payer: Self-pay

## 2021-12-25 ENCOUNTER — Ambulatory Visit: Payer: 59 | Admitting: Physical Therapy

## 2021-12-25 ENCOUNTER — Encounter: Payer: Self-pay | Admitting: Physical Therapy

## 2021-12-25 DIAGNOSIS — R293 Abnormal posture: Secondary | ICD-10-CM

## 2021-12-25 DIAGNOSIS — G8929 Other chronic pain: Secondary | ICD-10-CM

## 2021-12-25 DIAGNOSIS — M6281 Muscle weakness (generalized): Secondary | ICD-10-CM

## 2021-12-25 NOTE — Therapy (Signed)
[facility] [address], [place], [address] Phone: [phone]   Fax:  [phone]  Physical Therapy Treatment  Patient Details  Name: Carla Thompson MRN: [id number] Date of Birth: 11/16/1961 Referring Provider (PT): Steele Sizer, MD   Encounter Date: 12/25/2021   PT End of Session - 12/25/21 1658     Visit Number 8    Number of Visits 24    Date for PT Re-Evaluation 02/11/22    Authorization Type AETNA reporting period from 11/19/2021    Authorization Time Period 37 per cal yr    Authorization - Visit Number 8    Authorization - Number of Visits 30    Progress Note Due on Visit 10    PT Start Time 1600    PT Stop Time 1640    PT Time Calculation (min) 40 min    Activity Tolerance Patient tolerated treatment well    Behavior During Therapy [facility] for tasks assessed/performed   requires repeated explanation/education            Past Medical History:  Diagnosis Date   Abnormal perimenopausal bleeding    Bowel incontinence    Chronic constipation    Dermatitis    High risk medication use    Hyperglycemia    Hypertriglyceridemia    Iron deficiency anemia due to chronic blood loss    secondary to heavy flow   Metallic taste    Schizo-affective psychosis ([place])    Schizoaffective disorder, bipolar type ([place])    Dr. Tyrone Sage    Past Surgical History:  Procedure Laterality Date   ANUS SURGERY  2000   [place]   COLONOSCOPY     COLONOSCOPY WITH PROPOFOL N/A 11/30/2020   Procedure: COLONOSCOPY WITH PROPOFOL;  Surgeon: [name], MD;  Location: [facility] ENDOSCOPY;  Service: Gastroenterology;  Laterality: N/A;   TUBAL LIGATION  12/02/1994    There were no vitals filed for this visit.   Subjective Assessment - 12/25/21 1657     Subjective Patient arrives with her husband [name] who participates in session. She reports no pain upon arrival but is walking with head down. She states  she has been doing home exercises regularly.    Pertinent History Patient is a 61 y.o. female who presents to outpatient physical therapy with a referral for medical diagnosis neck muscle weakness, other kyphosis of cervical region. This patient's chief complaints consist of poor posture and inability to keep head and upper back in upright position, low back pain, leading to the following functional deficits: feels embarrassed to be seen in public or any social situation, difficulty with everyday activities such as vacuuming.  Relevant past medical history and comorbidities include schizoaffective disorder bipolar type, tachycardia, GERD, IBS, insomnia, bowel incontinence, chronic constipation.  Patient denies hx of cancer, stroke, seizures, lung problem, major cardiac events aside from tachycardia, diabetes, unexplained weight loss, stumbling or dropping things worsening lately.    Limitations Other (comment);House hold activities   Functional Limitations: Neck/posture it is embarrassing to go out in public or participate in any social activities.     Back pain limits normal everyday activities, vacuuming,   Diagnostic tests cervical spine radiograph ordered per chart review (pt unaware)    Patient Stated Goals to get better. "I don't want to be a hunch back"    Currently in Pain? No/denies             TREATMENT:    Therapeutic exercise: to centralize  symptoms and improve ROM, strength, muscular endurance, and activity tolerance required for successful completion of functional activities.  - standing retrocycle on upper body ergometer at level 7 with intermittent cuing to improve or maintain upright posture. To work postural muscles to improve postural awareness and endurance. ~  50min.  (Manual therapy - see below) - seated thoracic extension with towel roll behind upper back, hands clasped behind head. 3x10 - standing doorway pec stretch, 3x30 seconds - standing facing wall light green ball  tosses to hit the wall where sticky notes with various letters are placed on wall with additional time spent looking at the letters and trying to make words out of them before tossing ball to hit the appropriate letters. To improve postural strength and endurance.  Made 10 words.  - half dead lift to overhead press, tapping weights on stool at mid shin, 3x10 holding one 3#DB in each hand. Constant cuing to stand upright and look up. - seated lat bar hang with bar loaded with 25#, 2x30 seconds.   Manual therapy: to reduce pain and tissue tension, improve range of motion, neuromodulation, in order to promote improved ability to complete functional activities. SUPINE with NO PILLOW - manual cervical spine traction intermittently - cervical spine retraction PROM with OP at jaw, 3x10 - cervical spine joint mobilizations side glides and upglides to each side at each segment, grade III-IV as tolerated.    Pt required multimodal cuing for proper technique and to facilitate improved neuromuscular control, strength, range of motion, and functional ability resulting in improved performance and form. Patient required target objects and task related cuing to improve posture due to difficulty performing exercises voluntarily.   HOME EXERCISE PROGRAM Access Code: [email or web address] URL: [email or web address] Date: 12/06/2021 Prepared by: Rosita Kea   Exercises Seated Thoracic Lumbar Extension with Pectoralis Stretch - 1 x daily - 3 sets - 20 reps Laying supine with very thin or no pillow 2x15 min each day.  HOME EXERCISE PROGRAM [[id number]]   Wall Slides with Lift Off -  Repeat 10 Times, Complete 3 Sets, Perform 2 Times a Day    PT Education - 12/25/21 1658     Education Details Exercise purpose/form.    Person(s) Educated Patient;Spouse    Methods Explanation;Demonstration;Tactile cues;Verbal cues    Comprehension Verbalized understanding;Returned demonstration;Verbal cues required;Tactile cues  required;Need further instruction              PT Short Term Goals - 12/13/21 1807       PT SHORT TERM GOAL #1   Title Be independent with initial home exercise program for self-management of symptoms.    Baseline Initial HEP provided at IE (11/19/2021);    Time 2    Period Weeks    Status Achieved    Target Date 12/04/21               PT Long Term Goals - 11/20/21 1024       PT LONG TERM GOAL #1   Title Be independent with a long-term home exercise program for self-management of symptoms.    Baseline Initial HEP provided at IE (11/19/2021);    Time 12    Period Weeks    Status New   TARGET DATE FOR ALL LONG TERM GOALS: 02/11/2022     PT LONG TERM GOAL #2   Title Demonstrate improved FOTO score by 10 units to demonstrate improvement in overall condition and self-reported functional ability.    Baseline to be assessed visit 2 as  appropriate (11/19/2021);    Time 12    Period Weeks    Status New      PT LONG TERM GOAL #3   Title Have full cervical spine AROM with no compensations outside of those expected from scoliosis or increase in pain in all planes except intermittent end range discomfort to improve ability  to maintain healthy posture and complete valued community activities with confidence such as grocery shopping.    Baseline Stiff and weak - unalbe to maintain appropriate posture -  see objective exam (11/19/2021);    Time 12    Period Weeks    Status New      PT LONG TERM GOAL #4   Title Paient will demonstrate ability to maintain upright posture for equal or greater than 30 min during PT sessions to demonstrate improved postural awareness, strength, endurance and motor control for improved function in public/social places including grocery shopping or visiting family.    Baseline unable to keep head up for more than a few seconds (11/19/2021);    Time 12    Period Weeks    Status New      PT LONG TERM GOAL #5   Title Complete community, work and/or  recreational activities without limitation due to current condition.    Baseline Neck/posture it is embarrassing to go out in public or participate in any social activities.     Back pain limits normal everyday activities, vacuuming (11/19/2021);    Time 12    Period Weeks    Status New                   Plan - 12/25/21 1704     Clinical Impression Statement Patient tolerated treatment well overall with no increase in pain by end of session. Continued to work on improving postural mobility, awareness, strength, and endurance. Patient continues to need very frequent cuing to maintain optimized posture when she is not actively participating in a task that keeps her head up. Making words with letters on the wall overhead results in the longest sustained improved posture during session. Patient continues to respond best to task based cuing and continues to have limited ability to follow cuing to perform exercises repetitively if there is no task to complete besides the exercise. Patient would benefit from continued management of limiting condition by skilled physical therapist to address remaining impairments and functional limitations to work towards stated goals and return to PLOF or maximal functional independence.    Personal Factors and Comorbidities Behavior Pattern;Fitness;Time since onset of injury/illness/exacerbation;Education;Past/Current Experience    Examination-Activity Limitations Reach Overhead;Other   difficulty with everyday activities such as vacuuming.   Examination-Participation Restrictions Other;Interpersonal Relationship;Community Activity;Cleaning   eels embarrassed to be seen in public or any social situation   Stability/Clinical Decision Making Evolving/Moderate complexity    Rehab Potential Good    PT Frequency 2x / week    PT Duration 12 weeks    PT Treatment/Interventions ADLs/Self Care Home Management;Cryotherapy;Moist Heat;Cognitive remediation;Neuromuscular  re-education;Balance training;Therapeutic exercise;Therapeutic activities;Patient/family education;Manual techniques;Passive range of motion;Dry needling    PT Next Visit Plan update HEP as appropriate,  assess balance, postural strengthening, stretching, motor control    PT Home Exercise Plan hooklying laying as flat as possible 15 min a day, wall scaption lift offs    Consulted and Agree with Plan of Care Patient;Family member/caregiver    Family Member Consulted Husband [name]             Patient will  benefit from skilled therapeutic intervention in order to improve the following deficits and impairments:  Decreased cognition, Increased fascial restricitons, Improper body mechanics, Pain, Postural dysfunction, Decreased mobility, Decreased activity tolerance, Decreased endurance, Decreased range of motion, Decreased strength, Hypomobility, Impaired perceived functional ability, Impaired flexibility, Decreased balance  Visit Diagnosis: Abnormal posture  Muscle weakness (generalized)  Chronic bilateral low back pain without sciatica     Problem List Patient Active Problem List   Diagnosis Date Noted   Change in bowel habits 11/23/2020   Hyperlipidemia 11/20/2017   GERD without esophagitis 02/21/2017   Tachycardia 02/20/2016   RLS (restless legs syndrome) 12/21/2015   IBS (irritable bowel syndrome) 12/21/2015   Other long term (current) drug therapy 12/08/2015   Constipation 12/08/2015   Insomnia, persistent 12/08/2015   Dermatitis, eczematoid 12/08/2015   Gravida 2 para 2 12/08/2015   Hypertriglyceridemia 24/49/7530   Metallic taste 04/11/210   Female climacteric state 12/08/2015   Schizoaffective disorder, bipolar type ([place]) 12/08/2015   Everlean Alstrom. Graylon Good, PT, DPT 12/25/21, 5:05 PM   [facility] [address] [place], [address] Phone: [phone]   Fax:  [phone]  Name: Carla Thompson MRN: [id number] Date of Birth: 03-28-1961

## 2021-12-27 ENCOUNTER — Ambulatory Visit: Payer: 59 | Admitting: Physical Therapy

## 2021-12-27 ENCOUNTER — Other Ambulatory Visit: Payer: Self-pay

## 2021-12-27 ENCOUNTER — Encounter: Payer: Self-pay | Admitting: Physical Therapy

## 2021-12-27 DIAGNOSIS — G8929 Other chronic pain: Secondary | ICD-10-CM

## 2021-12-27 DIAGNOSIS — R293 Abnormal posture: Secondary | ICD-10-CM | POA: Diagnosis not present

## 2021-12-27 DIAGNOSIS — M545 Low back pain, unspecified: Secondary | ICD-10-CM

## 2021-12-27 DIAGNOSIS — M6281 Muscle weakness (generalized): Secondary | ICD-10-CM

## 2021-12-27 NOTE — Therapy (Signed)
[facility] [address], [place], [address] Phone: [phone]   Fax:  [phone]  Physical Therapy Treatment  Patient Details  Name: Carla Thompson MRN: [id number] Date of Birth: August 03, 1961 Referring Provider (PT): Steele Sizer, MD   Encounter Date: 12/27/2021   PT End of Session - 12/27/21 1713     Visit Number 9    Number of Visits 24    Date for PT Re-Evaluation 02/11/22    Authorization Type AETNA reporting period from 11/19/2021    Authorization Time Period 48 per cal yr    Authorization - Visit Number 9    Authorization - Number of Visits 30    Progress Note Due on Visit 10    PT Start Time 1605    PT Stop Time 1645    PT Time Calculation (min) 40 min    Activity Tolerance Patient tolerated treatment well    Behavior During Therapy [facility] for tasks assessed/performed   requires repeated explanation/education            Past Medical History:  Diagnosis Date   Abnormal perimenopausal bleeding    Bowel incontinence    Chronic constipation    Dermatitis    High risk medication use    Hyperglycemia    Hypertriglyceridemia    Iron deficiency anemia due to chronic blood loss    secondary to heavy flow   Metallic taste    Schizo-affective psychosis ([place])    Schizoaffective disorder, bipolar type ([place])    Dr. Tyrone Sage    Past Surgical History:  Procedure Laterality Date   ANUS SURGERY  2000   [place]   COLONOSCOPY     COLONOSCOPY WITH PROPOFOL N/A 11/30/2020   Procedure: COLONOSCOPY WITH PROPOFOL;  Surgeon: [name], MD;  Location: [facility] ENDOSCOPY;  Service: Gastroenterology;  Laterality: N/A;   TUBAL LIGATION  12/02/1994    There were no vitals filed for this visit.   Subjective Assessment - 12/27/21 1712     Subjective Patient is here with her husband. She reports no pain and states she felt okay after last PT session. She states she did exercises indoors  yesterday because it was raining.    Pertinent History Patient is a 61 y.o. female who presents to outpatient physical therapy with a referral for medical diagnosis neck muscle weakness, other kyphosis of cervical region. This patient's chief complaints consist of poor posture and inability to keep head and upper back in upright position, low back pain, leading to the following functional deficits: feels embarrassed to be seen in public or any social situation, difficulty with everyday activities such as vacuuming.  Relevant past medical history and comorbidities include schizoaffective disorder bipolar type, tachycardia, GERD, IBS, insomnia, bowel incontinence, chronic constipation.  Patient denies hx of cancer, stroke, seizures, lung problem, major cardiac events aside from tachycardia, diabetes, unexplained weight loss, stumbling or dropping things worsening lately.    Limitations Other (comment);House hold activities   Functional Limitations: Neck/posture it is embarrassing to go out in public or participate in any social activities.     Back pain limits normal everyday activities, vacuuming,   Diagnostic tests cervical spine radiograph ordered per chart review (pt unaware)    Patient Stated Goals to get better. "I don't want to be a hunch back"    Currently in Pain? No/denies             TREATMENT:    Therapeutic exercise: to centralize symptoms  and improve ROM, strength, muscular endurance, and activity tolerance required for successful completion of functional activities.  - standing retrocycle on upper body ergometer at level 7 with intermittent cuing to improve or maintain upright posture. To work postural muscles to improve postural awareness and endurance. ~  32min.  (Manual therapy - see below) - standing Y alternating lower trap lift offs, 3x10 each side with tactile cuing with hand targets from PT.  - prone (pillow under chest) lat pull on total gym set to level 11. 3x10 (Min A to get  on and off total gym).  - standing facing wall light green ball tosses to hit the wall where sticky notes with various letters are placed on wall with additional time spent looking at the letters and trying to make words out of them before tossing ball to hit the appropriate letters. To improve postural strength and endurance.  Made 10 words.  - half dead lift to overhead press, tapping weights on stool at mid shin, 3x10 holding one 3#DB in each hand. Constant cuing to stand upright and look up.  - standing cervical retraction with back against wall, 3x10 - attempted reverse push up at wall but unsuccessful due to difficulty with motor control    Manual therapy: to reduce pain and tissue tension, improve range of motion, neuromodulation, in order to promote improved ability to complete functional activities. SUPINE with NO PILLOW - manual cervical spine traction intermittently - cervical spine retraction PROM with OP at jaw, 3x10 - shaking and pressure at bilateral shoulders to relax tension and stretch anterior structures of chest such as pec minor.    Pt required multimodal cuing for proper technique and to facilitate improved neuromuscular control, strength, range of motion, and functional ability resulting in improved performance and form. Patient required target objects and task related cuing to improve posture due to difficulty performing exercises voluntarily.   HOME EXERCISE PROGRAM Access Code: [email or web address] URL: [email or web address] Date: 12/06/2021 Prepared by: Rosita Kea   Exercises Seated Thoracic Lumbar Extension with Pectoralis Stretch - 1 x daily - 3 sets - 20 reps Laying supine with very thin or no pillow 2x15 min each day.  HOME EXERCISE PROGRAM [[id number]]   Wall Slides with Lift Off -  Repeat 10 Times, Complete 3 Sets, Perform 2 Times a Day    PT Education - 12/27/21 1713     Education Details Exercise purpose/form.    Person(s) Educated Patient    Methods  Explanation;Demonstration;Tactile cues;Verbal cues    Comprehension Verbalized understanding;Returned demonstration;Verbal cues required;Tactile cues required;Need further instruction              PT Short Term Goals - 12/13/21 1807       PT SHORT TERM GOAL #1   Title Be independent with initial home exercise program for self-management of symptoms.    Baseline Initial HEP provided at IE (11/19/2021);    Time 2    Period Weeks    Status Achieved    Target Date 12/04/21               PT Long Term Goals - 11/20/21 1024       PT LONG TERM GOAL #1   Title Be independent with a long-term home exercise program for self-management of symptoms.    Baseline Initial HEP provided at IE (11/19/2021);    Time 12    Period Weeks    Status New   TARGET DATE FOR ALL LONG TERM GOALS: 02/11/2022  PT LONG TERM GOAL #2   Title Demonstrate improved FOTO score by 10 units to demonstrate improvement in overall condition and self-reported functional ability.    Baseline to be assessed visit 2 as appropriate (11/19/2021);    Time 12    Period Weeks    Status New      PT LONG TERM GOAL #3   Title Have full cervical spine AROM with no compensations outside of those expected from scoliosis or increase in pain in all planes except intermittent end range discomfort to improve ability  to maintain healthy posture and complete valued community activities with confidence such as grocery shopping.    Baseline Stiff and weak - unalbe to maintain appropriate posture -  see objective exam (11/19/2021);    Time 12    Period Weeks    Status New      PT LONG TERM GOAL #4   Title Paient will demonstrate ability to maintain upright posture for equal or greater than 30 min during PT sessions to demonstrate improved postural awareness, strength, endurance and motor control for improved function in public/social places including grocery shopping or visiting family.    Baseline unable to keep head up for  more than a few seconds (11/19/2021);    Time 12    Period Weeks    Status New      PT LONG TERM GOAL #5   Title Complete community, work and/or recreational activities without limitation due to current condition.    Baseline Neck/posture it is embarrassing to go out in public or participate in any social activities.     Back pain limits normal everyday activities, vacuuming (11/19/2021);    Time 12    Period Weeks    Status New                   Plan - 12/27/21 1725     Clinical Impression Statement Patient tolerated treatment well overall and had improved ability to complete reps of exercises this session. She had very stooped posture when she arrived which was improved after manual therapy and exercise. Continues to require almost constant cuing to keep head up. Progressed to prone lat pulls on total gym which patient found difficult but was able to perform. Patient and husband were in agreement that they would like to work towards discharge to a long term HEP over the next two visits. Patient would benefit from continued management of limiting condition by skilled physical therapist to address remaining impairments and functional limitations to work towards stated goals and return to PLOF or maximal functional independence.    Personal Factors and Comorbidities Behavior Pattern;Fitness;Time since onset of injury/illness/exacerbation;Education;Past/Current Experience    Examination-Activity Limitations Reach Overhead;Other   difficulty with everyday activities such as vacuuming.   Examination-Participation Restrictions Other;Interpersonal Relationship;Community Activity;Cleaning   eels embarrassed to be seen in public or any social situation   Stability/Clinical Decision Making Evolving/Moderate complexity    Rehab Potential Good    PT Frequency 2x / week    PT Duration 12 weeks    PT Treatment/Interventions ADLs/Self Care Home Management;Cryotherapy;Moist Heat;Cognitive  remediation;Neuromuscular re-education;Balance training;Therapeutic exercise;Therapeutic activities;Patient/family education;Manual techniques;Passive range of motion;Dry needling    PT Next Visit Plan update HEP as appropriate,  assess balance, postural strengthening, stretching, motor control    PT Home Exercise Plan hooklying laying as flat as possible 15 min a day, wall scaption lift offs    Consulted and Agree with Plan of Care Patient;Family member/caregiver  Family Member Consulted Husband [name]             Patient will benefit from skilled therapeutic intervention in order to improve the following deficits and impairments:  Decreased cognition, Increased fascial restricitons, Improper body mechanics, Pain, Postural dysfunction, Decreased mobility, Decreased activity tolerance, Decreased endurance, Decreased range of motion, Decreased strength, Hypomobility, Impaired perceived functional ability, Impaired flexibility, Decreased balance  Visit Diagnosis: Abnormal posture  Muscle weakness (generalized)  Chronic bilateral low back pain without sciatica     Problem List Patient Active Problem List   Diagnosis Date Noted   Change in bowel habits 11/23/2020   Hyperlipidemia 11/20/2017   GERD without esophagitis 02/21/2017   Tachycardia 02/20/2016   RLS (restless legs syndrome) 12/21/2015   IBS (irritable bowel syndrome) 12/21/2015   Other long term (current) drug therapy 12/08/2015   Constipation 12/08/2015   Insomnia, persistent 12/08/2015   Dermatitis, eczematoid 12/08/2015   Gravida 2 para 2 12/08/2015   Hypertriglyceridemia 88/87/5797   Metallic taste 28/20/6015   Female climacteric state 12/08/2015   Schizoaffective disorder, bipolar type ([place]) 12/08/2015    Everlean Alstrom. Graylon Good, PT, DPT 12/27/21, 5:25 PM   [facility] [address], [place], [address] Phone: [phone]   Fax:   [phone]  Name: Carla Thompson MRN: [id number] Date of Birth: 1961-04-14

## 2021-12-31 ENCOUNTER — Other Ambulatory Visit
Admission: RE | Admit: 2021-12-31 | Discharge: 2021-12-31 | Disposition: A | Discharge status: Home / Self Care | Payer: 59 | Attending: Psychiatry | Admitting: Psychiatry

## 2021-12-31 DIAGNOSIS — Z79899 Other long term (current) drug therapy: Secondary | ICD-10-CM | POA: Diagnosis not present

## 2021-12-31 DIAGNOSIS — F250 Schizoaffective disorder, bipolar type: Secondary | ICD-10-CM | POA: Diagnosis present

## 2021-12-31 LAB — CBC WITH DIFFERENTIAL/PLATELET
Abs Immature Granulocytes: 0.03 10*3/uL (ref 0.00–0.07)
Basophils Absolute: 0 10*3/uL (ref 0.0–0.1)
Basophils Relative: 0 %
Eosinophils Absolute: 0.2 10*3/uL (ref 0.0–0.5)
Eosinophils Relative: 2 %
HCT: 36.9 % (ref 36.0–46.0)
Hemoglobin: 11.6 g/dL — ABNORMAL LOW (ref 12.0–15.0)
Immature Granulocytes: 0 %
Lymphocytes Relative: 28 %
Lymphs Abs: 2.3 10*3/uL (ref 0.7–4.0)
MCH: 30.9 pg (ref 26.0–34.0)
MCHC: 31.4 g/dL (ref 30.0–36.0)
MCV: 98.1 fL (ref 80.0–100.0)
Monocytes Absolute: 0.5 10*3/uL (ref 0.1–1.0)
Monocytes Relative: 6 %
Neutro Abs: 5.3 10*3/uL (ref 1.7–7.7)
Neutrophils Relative %: 64 %
Platelets: 243 10*3/uL (ref 150–400)
RBC: 3.76 MIL/uL — ABNORMAL LOW (ref 3.87–5.11)
RDW: 12.6 % (ref 11.5–15.5)
WBC: 8.4 10*3/uL (ref 4.0–10.5)
nRBC: 0 % (ref 0.0–0.2)

## 2022-01-01 ENCOUNTER — Ambulatory Visit: Payer: 59 | Admitting: Physical Therapy

## 2022-01-01 ENCOUNTER — Other Ambulatory Visit: Payer: Self-pay

## 2022-01-01 ENCOUNTER — Encounter: Payer: Self-pay | Admitting: Physical Therapy

## 2022-01-01 DIAGNOSIS — R293 Abnormal posture: Secondary | ICD-10-CM | POA: Diagnosis not present

## 2022-01-01 DIAGNOSIS — G8929 Other chronic pain: Secondary | ICD-10-CM

## 2022-01-01 DIAGNOSIS — M545 Low back pain, unspecified: Secondary | ICD-10-CM

## 2022-01-01 DIAGNOSIS — M6281 Muscle weakness (generalized): Secondary | ICD-10-CM

## 2022-01-01 NOTE — Therapy (Signed)
[facility] [address], [place], [address] Phone: [phone]   Fax:  [phone]  Physical Therapy Treatment / Progress Note Dates of reporting from 11/19/2021 to 01/01/2022  Patient Details  Name: Carla Thompson MRN: [id number] Date of Birth: 01/24/1961 Referring Provider (PT): Steele Sizer, MD   Encounter Date: 01/01/2022   PT End of Session - 01/01/22 1705     Visit Number 10    Number of Visits 24    Date for PT Re-Evaluation 02/11/22    Authorization Type [employer] reporting period from 11/19/2021    Authorization Time Period 50 per cal yr    Authorization - Visit Number 9    Authorization - Number of Visits 30    Progress Note Due on Visit 10    PT Start Time 1604    PT Stop Time 1645    PT Time Calculation (min) 41 min    Activity Tolerance Patient tolerated treatment well    Behavior During Therapy [facility] for tasks assessed/performed   requires repeated explanation/education            Past Medical History:  Diagnosis Date   Abnormal perimenopausal bleeding    Bowel incontinence    Chronic constipation    Dermatitis    High risk medication use    Hyperglycemia    Hypertriglyceridemia    Iron deficiency anemia due to chronic blood loss    secondary to heavy flow   Metallic taste    Schizo-affective psychosis ([place])    Schizoaffective disorder, bipolar type ([place])    Dr. Tyrone Sage    Past Surgical History:  Procedure Laterality Date   ANUS SURGERY  2000   [place]   COLONOSCOPY     COLONOSCOPY WITH PROPOFOL N/A 11/30/2020   Procedure: COLONOSCOPY WITH PROPOFOL;  Surgeon: [name], MD;  Location: [facility] ENDOSCOPY;  Service: Gastroenterology;  Laterality: N/A;   TUBAL LIGATION  12/02/1994    There were no vitals filed for this visit.   Subjective Assessment - 01/01/22 1605     Subjective Patient reports she is feeling well today. She arrives with husband [name] who  accompanies her throughout session. She only has pain in her back when she does a lot of cleaning. She has been doing her HEP and she and her husband [name] feel comfortable working towards discharge by next PT session. She reports people in her life have noticed that she stands straighter and she feels better about going out in public.    Pertinent History Patient is a 61 y.o. female who presents to outpatient physical therapy with a referral for medical diagnosis neck muscle weakness, other kyphosis of cervical region. This patient's chief complaints consist of poor posture and inability to keep head and upper back in upright position, low back pain, leading to the following functional deficits: feels embarrassed to be seen in public or any social situation, difficulty with everyday activities such as vacuuming.  Relevant past medical history and comorbidities include schizoaffective disorder bipolar type, tachycardia, GERD, IBS, insomnia, bowel incontinence, chronic constipation.  Patient denies hx of cancer, stroke, seizures, lung problem, major cardiac events aside from tachycardia, diabetes, unexplained weight loss, stumbling or dropping things worsening lately.    Limitations Other (comment);House hold activities   Functional Limitations: Neck/posture it is embarrassing to go out in public or participate in any social activities.     Back pain limits normal everyday activities, vacuuming,   Diagnostic tests cervical  spine radiograph ordered per chart review (pt unaware)    Patient Stated Goals to get better. "I don't want to be a hunch back"    Currently in Pain? No/denies            OBJECTIVE  SELF-REPORTED FUNCTION FOTO score: 50/100 (cervical spine questionnaire)  SPINE MOTION   Cervical Spine AROM (sitting) Cued to start in most upright posture, but not in neutral at start:  Flexion: 65 Extension: 20 Side flexion: unable to get valid reading due to difficulty following directions  and inability to keep posture consistently upright.  Rotation:  R 50 from mostly upright position L 45 from mostly upright position   Cervical Spine PROM (supine) Cued to start in most upright posture, but not in neutral at start:  Extension: able to lay flat on plint table with occiput supported on flat surface without pillow. Increased lower cervical flexion and upper cervical extension noted with palpation.  Side flexion: ~ 40 degrees each direction   Rotation:  R 75 degrees L 70 degrees  Postural measurements standing back to wall: 5.5 cm from T1 to wall, able to touch occiput to wall in max extension/upright posture.     TREATMENT:    Therapeutic exercise: to centralize symptoms and improve ROM, strength, muscular endurance, and activity tolerance required for successful completion of functional activities.  - standing retrocycle on upper body ergometer at level 7 with intermittent cuing to improve or maintain upright posture. To work postural muscles to improve postural awareness and endurance. ~  17mn.   (Manual therapy - see below)  - doorway pec stretch, 3x30 seconds - measurements to assess progress (see above).  - standing Y alternating lower trap lift offs, 1x10 each side with tactile cuing with hand targets from PT. Husband [place]Remo [place] video for HEP.  - standing facing wall light green ball tosses to hit the wall where sticky notes with various letters are placed on wall with additional time spent looking at the letters and trying to make words out of them before tossing ball to hit the appropriate letters. To improve postural strength and endurance.  Made 6 words. Husband SRemo [place] video for HEP.  - half dead lift to overhead press, tapping weights on chair, 1x10 holding one 3#DB in each hand. Consistent cuing to stand upright and look up. Husband SRemo [place] video for HEP - seated thoracic extension with towel roll behind thoracic spine, 1x30  - standing cervical  retraction with back against wall, 1x10   Manual therapy: to reduce pain and tissue tension, improve range of motion, neuromodulation, in order to promote improved ability to complete functional activities. SUPINE with NO PILLOW - manual cervical spine traction intermittently - cervical spine retraction PROM with OP at jaw, 3x10 - intermittent pressure at bilateral shoulders to relax tension and stretch anterior structures of chest such as pec minor.    Pt required multimodal cuing for proper technique and to facilitate improved neuromuscular control, strength, range of motion, and functional ability resulting in improved performance and form. Patient required target objects and task related cuing to improve posture due to difficulty performing exercises voluntarily.   HOME EXERCISE PROGRAM Access Code: [email or web address]: [email or web address] Date: 01/01/2022 Prepared by: SRosita Kea Exercises Seated Thoracic Lumbar Extension with Pectoralis Stretch - 1 x daily - 3 sets - 20 reps Cervical Retraction at Wall - 3-5 x weekly - 3 sets - 10 reps Doorway Pec Stretch at 90 Degrees Abduction - 3-5 x weekly -  3 sets - 30 seconds hold  Laying supine with very thin or no pillow 2x15 min each day.  HOME EXERCISE PROGRAM [[email or web address]]   Wall Slides with Lift Off -  Repeat 10 Times, Complete 3 Sets, Perform 2 Times a Day  Home Exercises (3-5 times a week in addition to daily exercises) Laying on your back for 15 min Pec stretch in doorway, 3x30 seconds (handout).  Alternating hand lift offs of wall in Y position, 3x10 each side. (Handout/video) Tossing small ball on wall to make words from letters (usually do 10 words) (video) Half deadlift to overhead press with 3 lb dumbbells, 3x10 (video)     Seated thoracic extension over towel roll in chair, 3x20 (handout/video).  Standing cervical retraction against wall, 3x10  (handout).       PT Education - 01/01/22 1705     Education Details  Exercise purpose/form. progress. HEP    Person(s) Educated Patient;Spouse    Methods Explanation;Demonstration;Tactile cues;Verbal cues;Handout    Comprehension Verbalized understanding;Returned demonstration;Verbal cues required;Tactile cues required;Need further instruction              PT Short Term Goals - 12/13/21 1807       PT SHORT TERM GOAL #1   Title Be independent with initial home exercise program for self-management of symptoms.    Baseline Initial HEP provided at IE (11/19/2021);    Time 2    Period Weeks    Status Achieved    Target Date 12/04/21               PT Long Term Goals - 01/01/22 1707       PT LONG TERM GOAL #1   Title Be independent with a long-term home exercise program for self-management of symptoms.    Baseline Initial HEP provided at IE (11/19/2021); patient is participatient in appropriate HEP with assistance from husband [name] (01/01/2022);    Time 12    Period Weeks    Status Partially Met   TARGET DATE FOR ALL LONG TERM GOALS: 02/11/2022     PT LONG TERM GOAL #2   Title Demonstrate improved FOTO score by 10 units to demonstrate improvement in overall condition and self-reported functional ability.    Baseline to be assessed visit 2 as appropriate (11/19/2021); 46 at visit 4 (12/11/2021); 50 at visit 10 (01/01/2022);    Time 12    Period Weeks    Status Partially Met      PT LONG TERM GOAL #3   Title Have full cervical spine AROM with no compensations outside of those expected from scoliosis or increase in pain in all planes except intermittent end range discomfort to improve ability  to maintain healthy posture and complete valued community activities with confidence such as grocery shopping.    Baseline Stiff and weak - unalbe to maintain appropriate posture -  see objective exam (11/19/2021); patient with improved ability to maintain upright posture for cervical flexion/extension and rotation but unable to keep in appropriate upright  position for sidebending. Unclear if ROM has changed due to different measuring positions (01/01/2022);    Time 12    Period Weeks    Status Partially Met      PT LONG TERM GOAL #4   Title Paient will demonstrate ability to maintain upright posture for equal or greater than 30 min during PT sessions to demonstrate improved postural awareness, strength, endurance and motor control for improved function in public/social places including grocery shopping or visiting family.  Baseline unable to keep head up for more than a few seconds (11/19/2021); patient requires frequent cuing during PT session to keep head up but is able to maintain head up during ball on wall exercise for ~ 15 min (02/11/2022);    Time 12    Period Weeks    Status Partially Met      PT LONG TERM GOAL #5   Title Complete community, work and/or recreational activities without limitation due to current condition.    Baseline Neck/posture it is embarrassing to go out in public or participate in any social activities. Back pain limits normal everyday activities, vacuuming (11/19/2021); patient continues to report low back pain with house cleaning activities but reports decreased embarrasment in public and people are telling her she is more upright (01/01/2022);    Time 12    Period Weeks    Status Partially Met               Plan - 01/01/22 1717     Clinical Impression Statement Patient has attended 10 physical therapy sessions this episode of care that started 11/19/2021. Patient has demonstrated improvements in self-reported function (FOTO from 46 to 50), postural control, and ability to perform HEP. She also reports feeling more confident in public which was one of her chief functional complaints. Patient has difficulty with motor control and motor learning and needs intensive cuing to perform exercises effectively. Patient has demonstrated improved ability to complete exercises with practice in clinic and does better when  she is thinking of a task to complete instead of an exercise with form corrections. Patient appears to lack intrinsic postural awareness and returns to stooped posture without frequent cuing. Patient is transitioning towards becoming more independent with HEP with her husband's help at home. Plan to try decreased visit frequently to help transition to more independent management. Patient would benefit from continued management of limiting condition by skilled physical therapist to address remaining impairments and functional limitations to work towards stated goals and return to PLOF or maximal functional independence.    Personal Factors and Comorbidities Behavior Pattern;Fitness;Time since onset of injury/illness/exacerbation;Education;Past/Current Experience    Examination-Activity Limitations Reach Overhead;Other   difficulty with everyday activities such as vacuuming.   Examination-Participation Restrictions Other;Interpersonal Relationship;Community Activity;Cleaning   eels embarrassed to be seen in public or any social situation   Stability/Clinical Decision Making Evolving/Moderate complexity    Rehab Potential Good    PT Frequency 2x / week    PT Duration 12 weeks    PT Treatment/Interventions ADLs/Self Care Home Management;Cryotherapy;Moist Heat;Cognitive remediation;Neuromuscular re-education;Balance training;Therapeutic exercise;Therapeutic activities;Patient/family education;Manual techniques;Passive range of motion;Dry needling    PT Next Visit Plan update HEP as appropriate,  assess balance, postural strengthening, stretching, motor control    PT Home Exercise Plan hooklying laying as flat as possible 15 min a day, Medbridge Access Code: [email or web address], see body of note for additional exercises.    Consulted and Agree with Plan of Care Patient;Family member/caregiver    Family Member Consulted Husband [name]             Patient will benefit from skilled therapeutic intervention in order  to improve the following deficits and impairments:  Decreased cognition, Increased fascial restricitons, Improper body mechanics, Pain, Postural dysfunction, Decreased mobility, Decreased activity tolerance, Decreased endurance, Decreased range of motion, Decreased strength, Hypomobility, Impaired perceived functional ability, Impaired flexibility, Decreased balance  Visit Diagnosis: Abnormal posture  Muscle weakness (generalized)  Chronic bilateral low back pain without sciatica  Problem List Patient Active Problem List   Diagnosis Date Noted   Change in bowel habits 11/23/2020   Hyperlipidemia 11/20/2017   GERD without esophagitis 02/21/2017   Tachycardia 02/20/2016   RLS (restless legs syndrome) 12/21/2015   IBS (irritable bowel syndrome) 12/21/2015   Other long term (current) drug therapy 12/08/2015   Constipation 12/08/2015   Insomnia, persistent 12/08/2015   Dermatitis, eczematoid 12/08/2015   Gravida 2 para 2 12/08/2015   Hypertriglyceridemia 94/44/6190   Metallic taste 11/22/4113   Female climacteric state 12/08/2015   Schizoaffective disorder, bipolar type ([place]) 12/08/2015    Everlean Alstrom. Graylon Good, PT, DPT 01/01/22, 5:19 PM   [facility] [address] [place], [address] Phone: [phone]   Fax:  [phone]  Name: Carla Thompson MRN: [id number] Date of Birth: 1961/04/13

## 2022-01-02 ENCOUNTER — Other Ambulatory Visit: Payer: Self-pay | Admitting: Psychiatry

## 2022-01-02 ENCOUNTER — Telehealth: Payer: Self-pay | Admitting: Psychiatry

## 2022-01-02 DIAGNOSIS — F250 Schizoaffective disorder, bipolar type: Secondary | ICD-10-CM

## 2022-01-02 NOTE — Telephone Encounter (Signed)
See message regarding lab results. Labs were drawn 1/30.

## 2022-01-02 NOTE — Telephone Encounter (Signed)
She is asking because she has to get a CBC monthly because she is on: Clozapine.  Traci or I have to enter that information into [facility] in order for the patient to get the prescription from the pharmacy.  Her CBC was normal and it has been entered into [facility].  Please let the patient know she can get her prescription anytime.

## 2022-01-02 NOTE — Telephone Encounter (Signed)
Patient notified

## 2022-01-02 NOTE — Telephone Encounter (Signed)
Carla Thompson called wanting to find out what her lab results showed. Please call her at [phone].

## 2022-01-03 ENCOUNTER — Ambulatory Visit: Payer: 59 | Attending: Family Medicine | Admitting: Physical Therapy

## 2022-01-03 ENCOUNTER — Other Ambulatory Visit: Payer: Self-pay

## 2022-01-03 DIAGNOSIS — G8929 Other chronic pain: Secondary | ICD-10-CM | POA: Diagnosis present

## 2022-01-03 DIAGNOSIS — M545 Low back pain, unspecified: Secondary | ICD-10-CM | POA: Diagnosis present

## 2022-01-03 DIAGNOSIS — R293 Abnormal posture: Secondary | ICD-10-CM | POA: Diagnosis not present

## 2022-01-03 DIAGNOSIS — M6281 Muscle weakness (generalized): Secondary | ICD-10-CM

## 2022-01-03 NOTE — Therapy (Signed)
[facility] [address], [place], [address] Phone: [phone]   Fax:  [phone]  Physical Therapy Treatment  Patient Details  Name: Carla Thompson MRN: [id number] Date of Birth: 12-Jan-1961 Referring Provider (PT): Steele Sizer, MD   Encounter Date: 01/03/2022   PT End of Session - 01/03/22 1929     Visit Number 11    Number of Visits 24    Date for PT Re-Evaluation 02/11/22    Authorization Type [employer] reporting period from 11/19/2021    Authorization Time Period 60 per cal yr    Authorization - Visit Number 10    Authorization - Number of Visits 30    Progress Note Due on Visit 10    PT Start Time 1648    PT Stop Time 1730    PT Time Calculation (min) 42 min    Activity Tolerance Patient tolerated treatment well    Behavior During Therapy [facility] for tasks assessed/performed   requires repeated explanation/education            Past Medical History:  Diagnosis Date   Abnormal perimenopausal bleeding    Bowel incontinence    Chronic constipation    Dermatitis    High risk medication use    Hyperglycemia    Hypertriglyceridemia    Iron deficiency anemia due to chronic blood loss    secondary to heavy flow   Metallic taste    Schizo-affective psychosis ([place])    Schizoaffective disorder, bipolar type ([place])    Dr. Tyrone Sage    Past Surgical History:  Procedure Laterality Date   ANUS SURGERY  2000   [place]   COLONOSCOPY     COLONOSCOPY WITH PROPOFOL N/A 11/30/2020   Procedure: COLONOSCOPY WITH PROPOFOL;  Surgeon: [name], MD;  Location: [facility] ENDOSCOPY;  Service: Gastroenterology;  Laterality: N/A;   TUBAL LIGATION  12/02/1994    There were no vitals filed for this visit.   Subjective Assessment - 01/04/22 1240     Subjective Patient arrives with her husband [name] who accompanies her throughtout PT session. She states she has been working on her HEP but that she has  to do most of the exercises by herself because her husband is too busy with his schedule including work. She states the overhead lift-offs in the Y position are the most tricky for her. They also say she did not do the ball toss to make words because they have not yet found a reasonably priced small/light ball. She reports no pain currently.    Pertinent History Patient is a 61 y.o. female who presents to outpatient physical therapy with a referral for medical diagnosis neck muscle weakness, other kyphosis of cervical region. This patient's chief complaints consist of poor posture and inability to keep head and upper back in upright position, low back pain, leading to the following functional deficits: feels embarrassed to be seen in public or any social situation, difficulty with everyday activities such as vacuuming.  Relevant past medical history and comorbidities include schizoaffective disorder bipolar type, tachycardia, GERD, IBS, insomnia, bowel incontinence, chronic constipation.  Patient denies hx of cancer, stroke, seizures, lung problem, major cardiac events aside from tachycardia, diabetes, unexplained weight loss, stumbling or dropping things worsening lately.    Limitations Other (comment);House hold activities   Functional Limitations: Neck/posture it is embarrassing to go out in public or participate in any social activities.     Back pain limits normal everyday activities, vacuuming,  Diagnostic tests cervical spine radiograph ordered per chart review (pt unaware)    Patient Stated Goals to get better. "I don't want to be a hunch back"    Currently in Pain? No/denies                TREATMENT:    Therapeutic exercise: to centralize symptoms and improve ROM, strength, muscular endurance, and activity tolerance required for successful completion of functional activities.  - standing retrocycle on upper body ergometer at level 7 with intermittent cuing to improve or maintain upright  posture. To work postural muscles to improve postural awareness and endurance. ~  68mn.    (Manual therapy - see below)   - doorway pec stretch, 3x30 seconds - standing Y alternating lower trap lift offs, 3x10 each side with tactile and verbal cuing. Patient struggles with carry over to perfrom properly and various cues besides using PT 's hands for a target were utilized with mixed success.  - standing facing wall light green ball tosses to hit the wall where sticky notes with various letters are placed on wall with additional time spent looking at the letters and trying to make words out of them before tossing ball to hit the appropriate letters. To improve postural strength and endurance.  Made 10 words.  - half dead lift to overhead press, tapping weights on chair, 3x10 holding one 3#DB in each hand. Consistent cuing to stand upright and look up.  - seated thoracic extension with towel roll behind thoracic spine, 3x20  - standing cervical retraction with back against wall (PT hand as target), 3x10   Manual therapy: to reduce pain and tissue tension, improve range of motion, neuromodulation, in order to promote improved ability to complete functional activities. SUPINE with NO PILLOW - manual cervical spine traction intermittently - cervical spine retraction PROM with OP at jaw, 3x10 - intermittent pressure at bilateral shoulders to relax tension and stretch anterior structures of chest such as pec minor.    Pt required multimodal cuing for proper technique and to facilitate improved neuromuscular control, strength, range of motion, and functional ability resulting in improved performance and form. Patient required target objects and task related cuing to improve posture due to difficulty performing exercises voluntarily.   HOME EXERCISE PROGRAM Access Code: [email or web address]: [email or web address] Date: 01/01/2022 Prepared by: SRosita Kea  Exercises Seated Thoracic Lumbar  Extension with Pectoralis Stretch - 1 x daily - 3 sets - 20 reps Cervical Retraction at Wall - 3-5 x weekly - 3 sets - 10 reps Doorway Pec Stretch at 90 Degrees Abduction - 3-5 x weekly - 3 sets - 30 seconds hold   Laying supine with very thin or no pillow 2x15 min each day.  HOME EXERCISE PROGRAM [[email or web address]]   Wall Slides with Lift Off -  Repeat 10 Times, Complete 3 Sets, Perform 2 Times a Day   Home Exercises (3-5 times a week in addition to daily exercises) Laying on your back for 15 min Pec stretch in doorway, 3x30 seconds (handout).  Alternating hand lift offs of wall in Y position, 3x10 each side. (Handout/video) Tossing small ball on wall to make words from letters (usually do 10 words) (video) Half deadlift to overhead press with 3 lb dumbbells, 3x10 (video)     Seated thoracic extension over towel roll in chair, 3x20 (handout/video).  Standing cervical retraction against wall, 3x10  (handout).     PT Education - 01/04/22 1246     Education Details Exercise purpose/form.  progress. HEP    Person(s) Educated Patient;Spouse    Methods Explanation;Demonstration;Tactile cues;Verbal cues    Comprehension Verbalized understanding;Returned demonstration;Verbal cues required;Tactile cues required              PT Short Term Goals - 12/13/21 1807       PT SHORT TERM GOAL #1   Title Be independent with initial home exercise program for self-management of symptoms.    Baseline Initial HEP provided at IE (11/19/2021);    Time 2    Period Weeks    Status Achieved    Target Date 12/04/21               PT Long Term Goals - 01/01/22 1707       PT LONG TERM GOAL #1   Title Be independent with a long-term home exercise program for self-management of symptoms.    Baseline Initial HEP provided at IE (11/19/2021); patient is participatient in appropriate HEP with assistance from husband [name] (01/01/2022);    Time 12    Period Weeks    Status Partially Met   TARGET DATE FOR  ALL LONG TERM GOALS: 02/11/2022     PT LONG TERM GOAL #2   Title Demonstrate improved FOTO score by 10 units to demonstrate improvement in overall condition and self-reported functional ability.    Baseline to be assessed visit 2 as appropriate (11/19/2021); 46 at visit 4 (12/11/2021); 50 at visit 10 (01/01/2022);    Time 12    Period Weeks    Status Partially Met      PT LONG TERM GOAL #3   Title Have full cervical spine AROM with no compensations outside of those expected from scoliosis or increase in pain in all planes except intermittent end range discomfort to improve ability  to maintain healthy posture and complete valued community activities with confidence such as grocery shopping.    Baseline Stiff and weak - unalbe to maintain appropriate posture -  see objective exam (11/19/2021); patient with improved ability to maintain upright posture for cervical flexion/extension and rotation but unable to keep in appropriate upright position for sidebending. Unclear if ROM has changed due to different measuring positions (01/01/2022);    Time 12    Period Weeks    Status Partially Met      PT LONG TERM GOAL #4   Title Paient will demonstrate ability to maintain upright posture for equal or greater than 30 min during PT sessions to demonstrate improved postural awareness, strength, endurance and motor control for improved function in public/social places including grocery shopping or visiting family.    Baseline unable to keep head up for more than a few seconds (11/19/2021); patient requires frequent cuing during PT session to keep head up but is able to maintain head up during ball on wall exercise for ~ 15 min (02/11/2022);    Time 12    Period Weeks    Status Partially Met      PT LONG TERM GOAL #5   Title Complete community, work and/or recreational activities without limitation due to current condition.    Baseline Neck/posture it is embarrassing to go out in public or participate in any  social activities. Back pain limits normal everyday activities, vacuuming (11/19/2021); patient continues to report low back pain with house cleaning activities but reports decreased embarrasment in public and people are telling her she is more upright (01/01/2022);    Time 12    Period Weeks    Status Partially  Met                   Plan - 01/04/22 1246     Clinical Impression Statement Patient tolerated treatment well this session with no complaints of pain. Continued with manual therapy to stretch structures that contribute to poor posture. Session focused on reviewing and performing exercises from long term HEP to reinforce form. Patient continues to need frequent cuing for effective form and to improve her posture. Patient and husband have not scheduled another follow up at this time and plan to attempt more independent management with long term HEP at home, ideally with husband assisting patient with form to be more effective. They plan to call to schedule more visits in the future if needed. Patient would benefit from continued management of limiting condition by skilled physical therapist to address remaining impairments and functional limitations to work towards stated goals and return to PLOF or maximal functional independence.    Personal Factors and Comorbidities Behavior Pattern;Fitness;Time since onset of injury/illness/exacerbation;Education;Past/Current Experience    Examination-Activity Limitations Reach Overhead;Other   difficulty with everyday activities such as vacuuming.   Examination-Participation Restrictions Other;Interpersonal Relationship;Community Activity;Cleaning   eels embarrassed to be seen in public or any social situation   Stability/Clinical Decision Making Evolving/Moderate complexity    Rehab Potential Good    PT Frequency 2x / week    PT Duration 12 weeks    PT Treatment/Interventions ADLs/Self Care Home Management;Cryotherapy;Moist Heat;Cognitive  remediation;Neuromuscular re-education;Balance training;Therapeutic exercise;Therapeutic activities;Patient/family education;Manual techniques;Passive range of motion;Dry needling    PT Next Visit Plan update HEP as appropriate,  assess balance, postural strengthening, stretching, motor control    PT Home Exercise Plan hooklying laying as flat as possible 15 min a day, Medbridge Access Code: [email or web address], see body of note for additional exercises.    Consulted and Agree with Plan of Care Patient;Family member/caregiver    Family Member Consulted Husband [name]             Patient will benefit from skilled therapeutic intervention in order to improve the following deficits and impairments:  Decreased cognition, Increased fascial restricitons, Improper body mechanics, Pain, Postural dysfunction, Decreased mobility, Decreased activity tolerance, Decreased endurance, Decreased range of motion, Decreased strength, Hypomobility, Impaired perceived functional ability, Impaired flexibility, Decreased balance  Visit Diagnosis: Abnormal posture  Muscle weakness (generalized)  Chronic bilateral low back pain without sciatica     Problem List Patient Active Problem List   Diagnosis Date Noted   Change in bowel habits 11/23/2020   Hyperlipidemia 11/20/2017   GERD without esophagitis 02/21/2017   Tachycardia 02/20/2016   RLS (restless legs syndrome) 12/21/2015   IBS (irritable bowel syndrome) 12/21/2015   Other long term (current) drug therapy 12/08/2015   Constipation 12/08/2015   Insomnia, persistent 12/08/2015   Dermatitis, eczematoid 12/08/2015   Gravida 2 para 2 12/08/2015   Hypertriglyceridemia 98/26/4158   Metallic taste 30/94/0768   Female climacteric state 12/08/2015   Schizoaffective disorder, bipolar type ([place]) 12/08/2015    Everlean Alstrom. Graylon Good, PT, DPT 01/04/22, 12:47 PM   [facility] [address],  [place], [address] Phone: [phone]   Fax:  [phone]  Name: CITLALY CAMPLIN MRN: [id number] Date of Birth: January 02, 1961

## 2022-01-04 ENCOUNTER — Encounter: Payer: Self-pay | Admitting: Physical Therapy

## 2022-01-14 ENCOUNTER — Ambulatory Visit: Payer: Self-pay | Admitting: *Deleted

## 2022-01-14 NOTE — Telephone Encounter (Signed)
Summary: advice to relieve diarrhea   Pt stated she has been experiencing diarrhea and would like advice on what she could do to help. Cb# [phone]        Chief Complaint: diarrhea  Symptoms: diarrhea alternating constipation hx IBS on and off "a long time'. nausea Frequency: no specific time frame given Pertinent Negatives: Patient denies fever, no abdominal pain Disposition: [] ED /[] Urgent Care (no appt availability in office) / [x] Appointment(In office/virtual)/ []  [facility]/ [] Home Care/ [] Refused Recommended Disposition /[] [facility]/ [x]  Follow-up with PCP Additional Notes:   Earliest appt with PCP 01/22/22. Please advise if earlier appt available with different provider NT can not schedule. Recommended if symptoms worsen go to UC or ED.        Reason for Disposition  [1] MILD diarrhea (e.g., 1-3 or more stools than normal in past 24 hours) without known cause AND [2] present >  7 days  Answer Assessment - Initial Assessment Questions 1. DIARRHEA SEVERITY: "How bad is the diarrhea?" "How many more stools have you had in the past 24 hours than normal?"    - NO DIARRHEA (SCALE 0)   - MILD (SCALE 1-3): Few loose or mushy BMs; increase of 1-3 stools over normal daily number of stools; mild increase in ostomy output.   -  MODERATE (SCALE 4-7): Increase of 4-6 stools daily over normal; moderate increase in ostomy output. * SEVERE (SCALE 8-10; OR 'WORST POSSIBLE'): Increase of 7 or more stools daily over normal; moderate increase in ostomy output; incontinence.     Not sure 2. ONSET: "When did the diarrhea begin?"      On and off for a long time , hx IBS 3. BM CONSISTENCY: "How loose or watery is the diarrhea?"      Watery alternates between diarrhea and constipation 4. VOMITING: "Are you also vomiting?" If Yes, ask: "How many times in the past 24 hours?"      No  5. ABDOMINAL PAIN: "Are you having any abdominal pain?" If Yes, ask: "What does it feel  like?" (e.g., crampy, dull, intermittent, constant)      Nausea, no pain  6. ABDOMINAL PAIN SEVERITY: If present, ask: "How bad is the pain?"  (e.g., Scale 1-10; mild, moderate, or severe)   - MILD (1-3): doesn't interfere with normal activities, abdomen soft and not tender to touch    - MODERATE (4-7): interferes with normal activities or awakens from sleep, abdomen tender to touch    - SEVERE (8-10): excruciating pain, doubled over, unable to do any normal activities       na 7. ORAL INTAKE: If vomiting, "Have you been able to drink liquids?" "How much liquids have you had in the past 24 hours?"     Can drink fluids atleast 6 glasses of water a day  8. HYDRATION: "Any signs of dehydration?" (e.g., dry mouth [not just dry lips], too weak to stand, dizziness, new weight loss) "When did you last urinate?"     No  9. EXPOSURE: "Have you traveled to a foreign country recently?" "Have you been exposed to anyone with diarrhea?" "Could you have eaten any food that was spoiled?"     No  10. ANTIBIOTIC USE: "Are you taking antibiotics now or have you taken antibiotics in the past 2 months?"       No  11. OTHER SYMPTOMS: "Do you have any other symptoms?" (e.g., fever, blood in stool)       No  12.  PREGNANCY: "Is there any chance you are pregnant?" "When was your last menstrual period?"       na  Protocols used: [facility]

## 2022-01-22 ENCOUNTER — Other Ambulatory Visit: Payer: Self-pay

## 2022-01-22 ENCOUNTER — Ambulatory Visit (INDEPENDENT_AMBULATORY_CARE_PROVIDER_SITE_OTHER): Payer: 59 | Admitting: Psychiatry

## 2022-01-22 ENCOUNTER — Encounter: Payer: Self-pay | Admitting: Psychiatry

## 2022-01-22 DIAGNOSIS — K5909 Other constipation: Secondary | ICD-10-CM

## 2022-01-22 DIAGNOSIS — F250 Schizoaffective disorder, bipolar type: Secondary | ICD-10-CM | POA: Diagnosis not present

## 2022-01-22 DIAGNOSIS — Z79899 Other long term (current) drug therapy: Secondary | ICD-10-CM

## 2022-01-22 DIAGNOSIS — F4001 Agoraphobia with panic disorder: Secondary | ICD-10-CM

## 2022-01-22 MED ORDER — CLONAZEPAM 0.5 MG PO TABS: ORAL_TABLET | ORAL | 5 refills | Status: DC

## 2022-01-22 MED ORDER — LITHIUM CARBONATE 300 MG PO TABS: ORAL_TABLET | ORAL | 1 refills | Status: DC

## 2022-01-22 NOTE — Progress Notes (Signed)
Name: Carla Thompson   MRN: [id number]    DOB: 11-Aug-1961   Date:01/23/2022       Progress Note  Subjective  Chief Complaint  Pain- Tailbone   HPI  Neck weakness: she is sedentary , she has been getting PT and is doing home exercises and neck is getting better.   Tail bone pain: she fell on Feb 6 th inside her house, she could not seen ( it was dark in the room) she tripped and fell on her bottom . She is not sure how she fell. She had called her sister and was feeling sleepy since she had taken her pm medications. She recalls events prior to fall, she called for her husband that was in the living room. She has been having tail bone only when sitting, no problems while walking. No bruises noticed after the fall.    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 state 12/08/2015   Schizoaffective disorder, bipolar type ([place]) 12/08/2015    Past Surgical History:  Procedure Laterality Date   ANUS SURGERY  2000   [place]   COLONOSCOPY     COLONOSCOPY WITH PROPOFOL N/A 11/30/2020   Procedure: COLONOSCOPY WITH PROPOFOL;  Surgeon: [name], MD;  Location: [facility] ENDOSCOPY;  Service: Gastroenterology;  Laterality: N/A;   TUBAL LIGATION  12/02/1994    Family History  Problem Relation Age of Onset   Mental illness Sister        Schizophrenia and bipolar   Cancer Maternal Grandmother        Colon   Mental illness Paternal Grandmother        Schizophrenia   Breast cancer Neg Hx     Social History    Tobacco Use   Smoking status: Never   Smokeless tobacco: Never  Substance Use Topics   Alcohol use: No    Alcohol/week: 0.0 standard drinks     Current Outpatient Medications:    Ascorbic Acid (VITAMIN C) 500 MG CHEW, Chew by mouth., Disp: , Rfl:    clonazePAM (KLONOPIN) 0.5 MG tablet, TAKE 1 AND 1/2 TABLETS BY MOUTH EVERY MORNING AND EVERY NIGHT AT BEDTIME, Disp: 90 tablet, Rfl: 5   cloZAPine (CLOZARIL) 100 MG tablet, TAKE 1 TABLET BY MOUTH EVERY MORNING AND 3 TABLETS BY MOUTH EVERY NIGHT AT BEDTIME (Patient taking differently: 4 tabs HS), Disp: 120 tablet, Rfl: 6   famotidine (PEPCID) 40 MG tablet, TAKE 1 TABLET(40 MG) BY MOUTH DAILY, Disp: 90 tablet, Rfl: 1   lithium 300 MG tablet, TAKE 2 TABLETS(600 MG) BY MOUTH AT BEDTIME, Disp: 180 tablet, Rfl: 1   metoprolol tartrate (LOPRESSOR) 25 MG tablet, TAKE 1/2 TABLET(12.5 MG) BY MOUTH TWICE DAILY, Disp: 90 tablet, Rfl: 1   TRULANCE 3 MG TABS, TAKE 1 TABLET BY MOUTH DAILY, Disp: 90 tablet, Rfl: 3   Vitamin D, Ergocalciferol, (DRISDOL) 1.25 MG (50000 UNIT) CAPS capsule, Take 1 capsule (50,000 Units total) by mouth every 7 (seven) days., Disp: 12 capsule, Rfl: 1   zinc gluconate 50 MG tablet, Take  50 mg by mouth daily., Disp: , Rfl:   Allergies  Allergen Reactions   Levsin [Hyoscyamine Sulfate] Hives   Escitalopram Hives    I personally reviewed active problem list, medication list, allergies, family history, social history, health maintenance with the patient/caregiver today.   ROS  Ten systems reviewed and is negative except as mentioned in HPI   Objective  Vitals:   01/23/22 1339  BP: 118/70  Pulse: 93  Resp: 16  SpO2: 98%  Weight: 141 lb (64 kg)  Height: _0  (1.626 m)    Body mass index is 24.2 kg/m.  Physical Exam  Constitutional: Patient appears well-developed and well-nourished.  No distress.  HEENT: head atraumatic, normocephalic, pupils equal and reactive to light, neck supple Cardiovascular: Normal rate,  regular rhythm and normal heart sounds.  No murmur heard. No BLE edema. Pulmonary/Chest: Effort normal and breath sounds normal. No respiratory distress. Abdominal: Soft.  There is no tenderness. Muscular skeletal: neck flexed, pain during palpation of coccyx  Psychiatric: Patient has a normal mood and affect. behavior is normal. Judgment and thought content normal.   Recent Results (from the past 2160 hour(s))  CBC with Differential/Platelet     Status: None   Collection Time: 11/05/21 11:54 AM  Result Value Ref Range   WBC 8.5 4.0 - 10.5 K/uL   RBC 4.00 3.87 - 5.11 MIL/uL   Hemoglobin 12.4 12.0 - 15.0 g/dL   HCT 38.8 36.0 - 46.0 %   MCV 97.0 80.0 - 100.0 fL   MCH 31.0 26.0 - 34.0 pg   MCHC 32.0 30.0 - 36.0 g/dL   RDW 12.5 11.5 - 15.5 %   Platelets 242 150 - 400 K/uL   nRBC 0.0 0.0 - 0.2 %   Neutrophils Relative % 63 %   Neutro Abs 5.3 1.7 - 7.7 K/uL   Lymphocytes Relative 27 %   Lymphs Abs 2.3 0.7 - 4.0 K/uL   Monocytes Relative 6 %   Monocytes Absolute 0.6 0.1 - 1.0 K/uL   Eosinophils Relative 3 %   Eosinophils Absolute 0.3 0.0 - 0.5 K/uL   Basophils Relative 1 %   Basophils Absolute 0.1 0.0 - 0.1 K/uL   Immature Granulocytes 0 %   Abs Immature Granulocytes 0.03 0.00 - 0.07 K/uL    Comment: Performed at [facility], [address] [place], [place] [address]  Lipid panel     Status: Abnormal   Collection Time: 11/09/21  4:21 PM  Result Value Ref Range   Cholesterol 235 (H) <200 mg/dL   HDL 63 > OR = 50 mg/dL   Triglycerides 240 (H) <150 mg/dL    Comment: . If a non-fasting specimen was collected, consider repeat triglyceride testing on a fasting specimen if clinically indicated.  Yates Decamp et al. J. of Clin. Lipidol. 6144;3:154-008. Marland Kitchen    LDL Cholesterol (Calc) 133 (H) mg/dL (calc)    Comment: Reference range: <100 . Desirable range <100 mg/dL for primary prevention;   <70 mg/dL for patients with CHD or diabetic patients  with > or = 2 CHD risk  factors. Marland Kitchen LDL-C is now calculated using the Martin-Hopkins  calculation, which is a validated novel method providing  better accuracy than the Friedewald equation in the  estimation of LDL-C.  Cresenciano Genre et al. Annamaria Helling. 6761;950(93): 2061-2068  (http://education.[facility].com/faq/FAQ164)    Total CHOL/HDL Ratio 3.7 <5.0 (calc)   Non-HDL Cholesterol (Calc) 172 (H) <130 mg/dL (calc)    Comment: For patients with diabetes plus 1  major ASCVD risk  factor, treating to a non-HDL-C goal of <100 mg/dL  (LDL-C of <70 mg/dL) is considered a therapeutic  option.   COMPLETE METABOLIC PANEL WITH GFR     Status: None   Collection Time: 11/09/21  4:21 PM  Result Value Ref Range   Glucose, Bld 94 65 - 99 mg/dL    Comment: .            Fasting reference interval .    BUN 13 7 - 25 mg/dL   Creat 0.91 0.50 - 1.03 mg/dL   eGFR 73 > OR = 60 mL/min/1.28m    Comment: The eGFR is based on the CKD-EPI 2021 equation. To calculate  the new eGFR from a previous Creatinine or Cystatin C result, go to https://www.kidney.org/professionals/ kdoqi/gfr%5Fcalculator    BUN/Creatinine Ratio NOT APPLICABLE 6 - 22 (calc)   Sodium 137 135 - 146 mmol/L   Potassium 4.2 3.5 - 5.3 mmol/L   Chloride 101 98 - 110 mmol/L   CO2 30 20 - 32 mmol/L   Calcium 9.3 8.6 - 10.4 mg/dL   Total Protein 6.4 6.1 - 8.1 g/dL   Albumin 4.1 3.6 - 5.1 g/dL   Globulin 2.3 1.9 - 3.7 g/dL (calc)   AG Ratio 1.8 1.0 - 2.5 (calc)   Total Bilirubin 0.3 0.2 - 1.2 mg/dL   Alkaline phosphatase (APISO) 109 37 - 153 U/L   AST 14 10 - 35 U/L   ALT 9 6 - 29 U/L  CBC with Differential/Platelet     Status: Abnormal   Collection Time: 12/04/21  1:15 PM  Result Value Ref Range   WBC 8.6 4.0 - 10.5 K/uL   RBC 3.88 3.87 - 5.11 MIL/uL   Hemoglobin 11.8 (L) 12.0 - 15.0 g/dL   HCT 37.2 36.0 - 46.0 %   MCV 95.9 80.0 - 100.0 fL   MCH 30.4 26.0 - 34.0 pg   MCHC 31.7 30.0 - 36.0 g/dL   RDW 12.8 11.5 - 15.5 %   Platelets 254 150 - 400 K/uL    nRBC 0.0 0.0 - 0.2 %   Neutrophils Relative % 65 %   Neutro Abs 5.6 1.7 - 7.7 K/uL   Lymphocytes Relative 26 %   Lymphs Abs 2.3 0.7 - 4.0 K/uL   Monocytes Relative 5 %   Monocytes Absolute 0.5 0.1 - 1.0 K/uL   Eosinophils Relative 3 %   Eosinophils Absolute 0.2 0.0 - 0.5 K/uL   Basophils Relative 1 %   Basophils Absolute 0.1 0.0 - 0.1 K/uL   Immature Granulocytes 0 %   Abs Immature Granulocytes 0.03 0.00 - 0.07 K/uL    Comment: [facility] [address] [place] [address] [address] CBC with Differential/Platelet     Status: Abnormal   Collection Time: 12/31/21 12:05 PM  Result Value Ref Range   WBC 8.4 4.0 - 10.5 K/uL   RBC 3.76 (L) 3.87 - 5.11 MIL/uL   Hemoglobin 11.6 (L) 12.0 - 15.0 g/dL   HCT 36.9 36.0 - 46.0 %   MCV 98.1 80.0 - 100.0 fL   MCH 30.9 26.0 - 34.0 pg   MCHC 31.4 30.0 - 36.0 g/dL   RDW 12.6 11.5 - 15.5 %   Platelets 243 150 - 400 K/uL   nRBC 0.0 0.0 - 0.2 %   Neutrophils Relative % 64 %   Neutro Abs 5.3 1.7 - 7.7 K/uL   Lymphocytes Relative 28 %   Lymphs Abs  2.3 0.7 - 4.0 K/uL   Monocytes Relative 6 %   Monocytes Absolute 0.5 0.1 - 1.0 K/uL   Eosinophils Relative 2 %   Eosinophils Absolute 0.2 0.0 - 0.5 K/uL   Basophils Relative 0 %   Basophils Absolute 0.0 0.0 - 0.1 K/uL   Immature Granulocytes 0 %   Abs Immature Granulocytes 0.03 0.00 - 0.07 K/uL    Comment: Performed at [facility], [address] [place], [place] [address]    PHQ2/9: Depression screen [facility] 2/9 01/23/2022 11/09/2021 05/14/2021 02/02/2021 10/04/2020  Decreased Interest 0 0 1 0 3  Down, Depressed, Hopeless 0 0 0 0 3  PHQ - 2 Score 0 0 1 0 6  Altered sleeping 0 0 1 0 3  Tired, decreased energy 0 0 _0 Change in appetite 0 0 0 0 3  Feeling bad or failure about yourself  0 0 0 0 2  Trouble concentrating 0 0 _1 Moving slowly or fidgety/restless 0 0 3 0 2  Suicidal thoughts 0 0 0 0 0  PHQ-9 Score 0 0 _2 Difficult doing work/chores - Not  difficult at all Somewhat difficult - Very difficult  Some recent data might be hidden    phq 9 is negative   Fall Risk: Fall Risk  01/23/2022 11/09/2021 05/14/2021 02/02/2021 10/04/2020  Falls in the past year? 1 0 _3 Comment - - - - -  Number falls in past yr: 1 0 1 0 1  Comment - - - - -  Injury with Fall? 1 0 0 0 0  Comment - - - - -  Risk for fall due to : History of fall(s) No Fall Risks History of fall(s) - History of fall(s)  Follow up Falls prevention discussed Falls prevention discussed Falls prevention discussed - -      Functional Status Survey: Is the patient deaf or have difficulty hearing?: No Does the patient have difficulty seeing, even when wearing glasses/contacts?: No Does the patient have difficulty concentrating, remembering, or making decisions?: Yes Does the patient have difficulty walking or climbing stairs?: Yes Does the patient have difficulty dressing or bathing?: No Does the patient have difficulty doing errands alone such as visiting a doctor's office or shopping?: No    Assessment & Plan  1. Coccygeal pain, acute  - DG Sacrum/Coccyx; Future  2. Neck muscle weakness   Continue home PT - no longer going due to transportation issues - husband needs to drive her, but seems to be doing better since started PT , also discussed joining a gym and taking silver sneakers classes

## 2022-01-22 NOTE — Patient Instructions (Signed)
Book :  7 Steps to a Pain-Free Life by Delana Meyer

## 2022-01-22 NOTE — Progress Notes (Signed)
Carla Thompson [id number] 05/23/1961 61 y.o.  Subjective:   Patient ID:  Carla Thompson is a 61 y.o. (DOB 07/23/1961) female.  Chief Complaint:  Chief Complaint  Patient presents with   Follow-up    Schizoaffective disorder, bipolar type [facility])    HPI Carla Thompson presents to the office today for follow-up of schizoaffective disorder.  visit October, 2020.  No med changes.  She continued on clozapine 400, lithium 600, and clonazepam 0.5 twice daily as needed.   03/09/20 appt noted: StepF had been sick with Covid and other problems.  Died.  Other family members with Covid problems too and died. [name] worked from home 10/24/23 to April  Good overall without complaints.  Stress dealing with Covid.  Trying to stay active including cooking.  No major episodes of confusion nor voices.    No fear.  Does emails on the computer.  Not much anxiety lately.  Sleep same 9-9.  No fear episodes.  No manic nor depressive episodes.  No significant panic attacks recently.  She is not avoiding things out of anxiety generally.  She's still active at church.   Large infrequent BM clog toilet and intermittently not well managed.  Plan: Still problems with large infrequent q 4-7 days, large BM.  Often clogs the toilet.  Not well managed. Increase Linzess trial to 290 mg daily to see if it's better managed.  09/07/20 appt with following noted: She has notes. Balance problems, movements in feet legs, stomach.  Marching movements in feet.  Scares her. 1 fall on stairs going up. Wonders about reducing clozapine to 3 instead of 4 bc of tiredness. B researched lithium and said lithium and clozapine are "bad medicines with a lot of SE".   she's worried about SE. Disc ran out of clozapine for 2 weeks and decompensated.  back on it for almost a month.  Pharmacy was saying they couldn't dispense clozapine without labs and they didn't have the labs.  H says she's not back to normal yet bc can't focus and make simple  decisions.  For example couldn't decide whether to drink water out of the faucet or out of Brita water filter.  Lost 10# and not eating well.  Poor appetite.   Severely constipated chronically.  Using miralax.  Plan: continue clozapine  11/09/20 appt with following noted:  Seen with Loretha Brasil [place] problems with chronic constipation and using Miralax twice daily.  It is helpful but varies.  Doesn't like clozapine for this reason.  Usual trouble is leakage from the stool.  Disc sphincter exercises.   Lost weight. Some depression over the TD including feet moving up and down.  Someitmes stomach moves.  Occ tremor but not lately.  Trouble with sleep, initial and middle. Some of the problems with getting clozapine may have been related to not getting th labs to the correct [facility] in [place].  Doesn't like prunes or prune juice..  Not attending church due to having to wear depends and "tardive dyskinesia".   Plan: Can switch to clonazepam 0.5 mg in am and 1.0 mg HS  02/20/2021 appointment with the following noted: Walks outside.   [name] got Covid for 3 weeks.  She got milder sx with assumption she had it also. No problems getting clozapine since here.  Have worked on getting it more consistently and that seems to be working.    Better than 6 mos ago.  Stronger physically and mentally.  No sig panic attacks now. Balance  is better.  Weight loss stopped.  Not depressed.   Better with more consistency with clozapine. Exercising more and that's helped.  Plan: Continue clozapine 400 mg HS  (She prefers all at once at night) Contnue lithium 600 mg HS Panic disorder controlled. clonazepam 0.75 mg BID   07/26/2021 appointment with the following noted: seen with Loretha Brasil Hard time swallowing pills.  Disc jury duty.  She doesn't feel capable.  Doesn't drive.   Anxious about taking pills but otherwise is usually doing ok.  Tryitng to walk 6 times weekly and physically feels better.   Patient reports stable mood  and denies depressed or irritable moods.  Patient denies any recent difficulty with anxiety.  Patient denies difficulty with sleep initiation or maintenance. Denies appetite disturbance.  Patient reports that energy and motivation have been good.  Patient with chronic difficulty with concentration.  Patient denies any suicidal ideation. No problems getting clozapine lately. Plan: No med changes  01/22/2022 appointment with the following noted: Continues clozapine 400 mg nightly, lithium 600 mg nightly, clonazepam 0.75 mg twice daily For the most part doing well.  Except fell and hurt tailbone.  Fell in the dark at night and was disoriented to space and tripped. It continues to hurt after a couple of weeks. Asks about reducing clozapine to 3 nightly. No panic attacks since here.   Oldest daughter married March 2020  On clozapine for many years with benefit.  Several psych hospitalizations.  She is failed multiple other psychiatric medications which is why she is on clozapine.  She is also failed or relapsed with dosage reduction of clozapine or if she missed clozapine.  She is consistent with her dosage now.  Past Psychiatric Medication Trials: Clozapine 400 Clonazepam Lithium 600 Citrucil, Miralax, stool softener, Mg tablets  Sister has schizophrenia.  Review of Systems:  Review of Systems  Cardiovascular:  Negative for palpitations.  Gastrointestinal:  Positive for abdominal distention, constipation and diarrhea. Negative for abdominal pain.  Genitourinary:  Positive for enuresis.  Musculoskeletal:  Positive for neck stiffness.  Neurological:  Negative for tremors.  Psychiatric/Behavioral:  Positive for decreased concentration, dysphoric mood and sleep disturbance. Negative for agitation, behavioral problems, confusion, hallucinations, self-injury and suicidal ideas. The patient is not nervous/anxious and is not hyperactive.  Constipation comes and goes  Medications: I have reviewed  the patient's current medications.  Current Outpatient Medications  Medication Sig Dispense Refill   Ascorbic Acid (VITAMIN C) 500 MG CHEW Chew by mouth.     cloZAPine (CLOZARIL) 100 MG tablet TAKE 1 TABLET BY MOUTH EVERY MORNING AND 3 TABLETS BY MOUTH EVERY NIGHT AT BEDTIME (Patient taking differently: 4 tabs HS) 120 tablet 6   famotidine (PEPCID) 40 MG tablet TAKE 1 TABLET(40 MG) BY MOUTH DAILY 90 tablet 1   metoprolol tartrate (LOPRESSOR) 25 MG tablet TAKE 1/2 TABLET(12.5 MG) BY MOUTH TWICE DAILY 90 tablet 1   TRULANCE 3 MG TABS TAKE 1 TABLET BY MOUTH DAILY 90 tablet 3   Vitamin D, Ergocalciferol, (DRISDOL) 1.25 MG (50000 UNIT) CAPS capsule Take 1 capsule (50,000 Units total) by mouth every 7 (seven) days. 12 capsule 1   zinc gluconate 50 MG tablet Take 50 mg by mouth daily.     clonazePAM (KLONOPIN) 0.5 MG tablet TAKE 1 AND 1/2 TABLETS BY MOUTH EVERY MORNING AND EVERY NIGHT AT BEDTIME 90 tablet 5   lithium 300 MG tablet TAKE 2 TABLETS(600 MG) BY MOUTH AT BEDTIME 180 tablet 1   No current facility-administered  medications for this visit.    Medication Side Effects: Other: alternating contstipation and diarrhea but has IBS  Allergies:  Allergies  Allergen Reactions   Levsin [Hyoscyamine Sulfate] Hives   Escitalopram Hives    Past Medical History:  Diagnosis Date   Abnormal perimenopausal bleeding    Bowel incontinence    Chronic constipation    Dermatitis    High risk medication use    Hyperglycemia    Hypertriglyceridemia    Iron deficiency anemia due to chronic blood loss    secondary to heavy flow   Metallic taste    Schizo-affective psychosis (HCC)    Schizoaffective disorder, bipolar type ([place])    Dr. Tyrone Sage    Family History  Problem Relation Age of Onset   Mental illness Sister        Schizophrenia and bipolar   Cancer Maternal Grandmother        Colon   Mental illness Paternal Grandmother        Schizophrenia   Breast cancer Neg Hx     Social  History   Socioeconomic History   Marital status: Married    Spouse name: [name]   Number of children: 2   Years of education: Not on file   Highest education level: Bachelor's degree (e.g., BA, AB, BS)  Occupational History   Not on file  Tobacco Use   Smoking status: Never   Smokeless tobacco: Never  Vaping Use   Vaping Use: Never used  Substance and Sexual Activity   Alcohol use: No    Alcohol/week: 0.0 standard drinks   Drug use: No   Sexual activity: Not Currently    Partners: Male  Other Topics Concern   Not on file  Social History Narrative   Not on file   Social Determinants of Health   Financial Resource Strain: Not on file  Food Insecurity: Not on file  Transportation Needs: Not on file  Physical Activity: Not on file  Stress: Not on file  Social Connections: Not on file  Intimate Partner Violence: Not on file    Past Medical History, Surgical history, Social history, and Family history were reviewed and updated as appropriate.   Please see review of systems for further details on the patient's review from today.   Objective:   Physical Exam:  LMP 12/16/2016 (Approximate)   Physical Exam Constitutional:      General: She is not in acute distress.    Appearance: She is well-developed.  Musculoskeletal:        General: No deformity.  Neurological:     Mental Status: She is alert and oriented to person, place, and time.     Cranial Nerves: No dysarthria.     Coordination: Coordination normal.  Psychiatric:        Attention and Perception: Perception normal. She is inattentive. She does not perceive auditory or visual hallucinations.        Mood and Affect: Mood is anxious. Mood is not depressed. Affect is not labile, blunt, angry, tearful or inappropriate.        Speech: Speech is not rapid and pressured, slurred or tangential.        Behavior: Behavior normal. Behavior is cooperative.        Thought Content: Thought content is not paranoid or  delusional. Thought content does not include homicidal or suicidal ideation. Thought content does not include suicidal plan.        Cognition and Memory: Cognition is impaired. She exhibits  impaired recent memory.     Comments: Insight & judgment still fair. Scattered thought chronically but less hyperverbal and overinclusiveness.  Not as fidgety as last time.  Affect more relaxed. No irritable Chronic stooped posture.      Lab Review:     Component Value Date/Time   NA 137 11/09/2021 1621   NA 140 01/18/2016 1418   NA 139 08/27/2013 0930   K 4.2 11/09/2021 1621   K 4.2 08/27/2013 0930   CL 101 11/09/2021 1621   CL 108 (H) 08/27/2013 0930   CO2 30 11/09/2021 1621   CO2 28 08/27/2013 0930   GLUCOSE 94 11/09/2021 1621   GLUCOSE 98 08/27/2013 0930   BUN 13 11/09/2021 1621   BUN 17 01/18/2016 1418   BUN 11 08/27/2013 0930   CREATININE 0.91 11/09/2021 1621   CALCIUM 9.3 11/09/2021 1621   CALCIUM 8.5 08/27/2013 0930   PROT 6.4 11/09/2021 1621   PROT 6.6 01/18/2016 1418   PROT 7.2 08/27/2013 0930   ALBUMIN 4.3 09/11/2020 2000   ALBUMIN 4.4 01/18/2016 1418   ALBUMIN 3.5 08/27/2013 0930   AST 14 11/09/2021 1621   AST 20 08/27/2013 0930   ALT 9 11/09/2021 1621   ALT 19 08/27/2013 0930   ALKPHOS 95 09/11/2020 2000   ALKPHOS 107 08/27/2013 0930   BILITOT 0.3 11/09/2021 1621   BILITOT <0.2 01/18/2016 1418   BILITOT 0.3 08/27/2013 0930   GFRNONAA >60 09/11/2020 2000   GFRNONAA 63 07/11/2020 1558   GFRAA >60 08/31/2020 1633   GFRAA 73 07/11/2020 1558       Component Value Date/Time   WBC 8.4 12/31/2021 1205   RBC 3.76 (L) 12/31/2021 1205   HGB 11.6 (L) 12/31/2021 1205   HGB 12.3 01/23/2016 1231   HCT 36.9 12/31/2021 1205   HCT 38.6 01/23/2016 1231   PLT 243 12/31/2021 1205   PLT 280 01/23/2016 1231   MCV 98.1 12/31/2021 1205   MCV 95 01/23/2016 1231   MCV 92 03/24/2015 1244   MCH 30.9 12/31/2021 1205   MCHC 31.4 12/31/2021 1205   RDW 12.6 12/31/2021 1205   RDW  13.7 01/23/2016 1231   RDW 13.1 03/24/2015 1244   LYMPHSABS 2.3 12/31/2021 1205   LYMPHSABS 2.4 01/23/2016 1231   LYMPHSABS 2.2 03/24/2015 1244   MONOABS 0.5 12/31/2021 1205   MONOABS 0.4 03/24/2015 1244   EOSABS 0.2 12/31/2021 1205   EOSABS 0.1 01/23/2016 1231   EOSABS 0.4 03/24/2015 1244   BASOSABS 0.0 12/31/2021 1205   BASOSABS 0.0 01/23/2016 1231   BASOSABS 0.0 03/24/2015 1244    Lithium Lvl  Date Value Ref Range Status  09/12/2020 0.46 (L) 0.60 - 1.20 mmol/L Final    Comment:    Performed at [facility], [address], [place], [place] [address]     Lab Results  Component Value Date   VALPROATE 102 (H) 12/26/2015     .res Assessment: Plan:    Schizoaffective disorder, bipolar type ([place]) - Plan: CBC with Differential/Platelet, Lithium level, lithium 300 MG tablet  Panic disorder with agoraphobia - Plan: clonazePAM (KLONOPIN) 0.5 MG tablet  Chronic constipation  Lithium use - Plan: Lithium level  Long term current use of clozapine - Plan: CBC with Differential/Platelet   Overall her schizoaffective disorder was under control.  Until she ran out of clozapine bc of difficulty getting it last summer/fall 2021.  This led to more disorganziation, confusion, anxiety and rumination.  Insight is not good.  Not self aware  about overinclusiveness. All of this is better now with better clozapine consistency and exercise.  Discussed the importance of consistent with safety with the clozapine.  If they have difficulty obtaining it in the future please contact us right away.  She missed an off of the clozapine to cause disorganization and psychotic symptoms as well as anxiety.  It would take a while for that to resolve.  Answered questions about clozapine and alternatives. Continue clozapine 400 mg HS  (She prefers all at once at night)  Panic disorder controlled. clonazepam 0.75 mg BID  Need repeat lithium level. Contnue lithium 600 mg HS  Counseled patient  regarding potential benefits, risks, and side effects of lithium to include potential risk of lithium affecting thyroid and renal function.  Discussed need for periodic lab monitoring to determine drug level and to assess for potential adverse effects.  Counseled patient regarding signs and symptoms of lithium toxicity and advised that they notify office immediately or seek urgent medical attention if experiencing these signs and symptoms.  Patient advised to contact office with any questions or concerns.  Fall precautions around sedative medications. Disc PT for head forward posture which she did some.  Emphasized this as means to help reduce fall risk. Book :  7 Steps to a Pain-Free Life by Delana Meyer  ANC stable the last 6 months. Continue clozapine.   Continue lab test per usual.    GI managing constipation better.  Exercise helps constipation. Answered questions about probiotics.  No med changes:  Continue clozapine 400 mg HS  (She prefers all at once at night) Lithium 600 mg HS clonazepam 0.75 mg BID  This appt was 30 mins.  FU 6 mos  Lynder Parents, MD, DFAPA   Please see After Visit Summary for patient specific instructions.  Future Appointments  Date Time Provider [place]  01/23/2022  1:40 PM Steele Sizer, MD [facility]  02/20/2022  3:20 PM Steele Sizer, MD [facility]  05/15/2022  3:40 PM Steele Sizer, MD [facility]  07/22/2022  4:30 PM Cottle, Billey Co., MD [facility] None    Orders Placed This Encounter  Procedures   CBC with Differential/Platelet   Lithium level       -------------------------------

## 2022-01-23 ENCOUNTER — Encounter: Payer: Self-pay | Admitting: Family Medicine

## 2022-01-23 ENCOUNTER — Ambulatory Visit
Admission: RE | Admit: 2022-01-23 | Discharge: 2022-01-23 | Disposition: A | Discharge status: Home / Self Care | Payer: 59 | Attending: Family Medicine | Admitting: Family Medicine

## 2022-01-23 ENCOUNTER — Ambulatory Visit (INDEPENDENT_AMBULATORY_CARE_PROVIDER_SITE_OTHER): Payer: 59 | Admitting: Family Medicine

## 2022-01-23 ENCOUNTER — Ambulatory Visit
Admission: RE | Admit: 2022-01-23 | Discharge: 2022-01-23 | Disposition: A | Discharge status: Home / Self Care | Payer: 59 | Source: Ambulatory Visit | Attending: Family Medicine | Admitting: Family Medicine

## 2022-01-23 VITALS — BP 118/70 | HR 93 | Resp 16 | Ht 64.0 in | Wt 141.0 lb

## 2022-01-23 DIAGNOSIS — M5382 Other specified dorsopathies, cervical region: Secondary | ICD-10-CM | POA: Diagnosis not present

## 2022-01-23 DIAGNOSIS — M533 Sacrococcygeal disorders, not elsewhere classified: Secondary | ICD-10-CM

## 2022-01-24 ENCOUNTER — Telehealth: Payer: Self-pay

## 2022-01-24 NOTE — Telephone Encounter (Signed)
Copied from [place] [id number]. Topic: General - Call Back - No Documentation >> Jan 23, 2022  4:44 PM [name] wrote: Reason for CRM: Pt would like to discuss imaging results when available, please advise if [facility] may disclose [phone]

## 2022-01-30 ENCOUNTER — Other Ambulatory Visit
Admission: RE | Admit: 2022-01-30 | Discharge: 2022-01-30 | Disposition: A | Discharge status: Home / Self Care | Payer: 59 | Source: Ambulatory Visit | Attending: Psychiatry | Admitting: Psychiatry

## 2022-01-30 DIAGNOSIS — F250 Schizoaffective disorder, bipolar type: Secondary | ICD-10-CM | POA: Insufficient documentation

## 2022-01-30 DIAGNOSIS — Z79899 Other long term (current) drug therapy: Secondary | ICD-10-CM | POA: Diagnosis not present

## 2022-01-30 LAB — CBC WITH DIFFERENTIAL/PLATELET
Abs Immature Granulocytes: 0.02 10*3/uL (ref 0.00–0.07)
Basophils Absolute: 0.1 10*3/uL (ref 0.0–0.1)
Basophils Relative: 1 %
Eosinophils Absolute: 0.4 10*3/uL (ref 0.0–0.5)
Eosinophils Relative: 5 %
HCT: 38.6 % (ref 36.0–46.0)
Hemoglobin: 11.8 g/dL — ABNORMAL LOW (ref 12.0–15.0)
Immature Granulocytes: 0 %
Lymphocytes Relative: 29 %
Lymphs Abs: 2.2 10*3/uL (ref 0.7–4.0)
MCH: 29.4 pg (ref 26.0–34.0)
MCHC: 30.6 g/dL (ref 30.0–36.0)
MCV: 96.3 fL (ref 80.0–100.0)
Monocytes Absolute: 0.5 10*3/uL (ref 0.1–1.0)
Monocytes Relative: 6 %
Neutro Abs: 4.6 10*3/uL (ref 1.7–7.7)
Neutrophils Relative %: 59 %
Platelets: 236 10*3/uL (ref 150–400)
RBC: 4.01 MIL/uL (ref 3.87–5.11)
RDW: 13.1 % (ref 11.5–15.5)
WBC: 7.8 10*3/uL (ref 4.0–10.5)
nRBC: 0 % (ref 0.0–0.2)

## 2022-02-01 ENCOUNTER — Other Ambulatory Visit: Payer: Self-pay

## 2022-02-01 DIAGNOSIS — D649 Anemia, unspecified: Secondary | ICD-10-CM

## 2022-02-01 NOTE — Progress Notes (Signed)
added

## 2022-02-01 NOTE — Progress Notes (Signed)
[facility] lab did not draw extra tubes so they cannot add on iron study. Patient will have to come in for draw ?

## 2022-02-16 ENCOUNTER — Other Ambulatory Visit: Payer: Self-pay | Admitting: Family Medicine

## 2022-02-16 DIAGNOSIS — K219 Gastro-esophageal reflux disease without esophagitis: Secondary | ICD-10-CM

## 2022-02-19 NOTE — Progress Notes (Addendum)
Name: Carla Thompson   MRN: [id number]    DOB: [birth year]   Date:02/20/2022       Progress Note  Subjective  Chief Complaint  Annual Exam  HPI  Patient presents for annual CPE.  Husband is here with her   Diet: high in fruit , salads, lean meat, they cook at home  Exercise: continue regular physical activity    Flowsheet Row Office Visit from 02/20/2022 in [facility]  AUDIT-C Score 0      Depression: Phq 9 is  negative    02/20/2022    3:14 PM 01/23/2022    1:39 PM 11/09/2021    3:30 PM 05/14/2021    3:46 PM 02/02/2021   11:21 AM  Depression screen PHQ 2/9  Decreased Interest 0 0 0 1 0  Down, Depressed, Hopeless 0 0 0 0 0  PHQ - 2 Score 0 0 0 1 0  Altered sleeping 0 0 0 1 0  Tired, decreased energy 0 0 0 1 3  Change in appetite 0 0 0 0 0  Feeling bad or failure about yourself  0 0 0 0 0  Trouble concentrating 0 0 0 2 1  Moving slowly or fidgety/restless 0 0 0 3 0  Suicidal thoughts 0 0 0 0 0  PHQ-9 Score 0 0 0 8 4  Difficult doing work/chores   Not difficult at all Somewhat difficult    Hypertension: BP Readings from Last 3 Encounters:  02/20/22 120/72  01/23/22 118/70  11/09/21 126/66   Obesity: Wt Readings from Last 3 Encounters:  02/20/22 144 lb (65.3 kg)  01/23/22 141 lb (64 kg)  11/09/21 142 lb 8 oz (64.6 kg)   BMI Readings from Last 3 Encounters:  02/20/22 24.72 kg/m  01/23/22 24.20 kg/m  11/09/21 24.46 kg/m     Vaccines:   HPV: N/A Tdap: up to date Shingrix: they will check coverage with insurance Pneumonia: N/A Flu: refused  COVID-19: refused    Hep C Screening: 12/28/12 STD testing and prevention (HIV/chl/gon/syphilis): 07/11/20 Intimate partner violence: negative Sexual History : she has pain during intercourse but improves with lubrication , she has vaginal dryness  Menstrual History/LMP/Abnormal Bleeding: s/p menopause Discussed importance of follow up if any post-menopausal bleeding: yes Incontinence Symptoms:  No.  Breast cancer:  - Last Mammogram: 09/27/21 - BRCA gene screening: N/A  Osteoporosis Prevention : Discussed high calcium and vitamin D supplementation, weight bearing exercises Bone density :she will get it done this Fall with mammogram   Cervical cancer screening: 07/11/20  Skin cancer: Discussed monitoring for atypical lesions  Colorectal cancer: 11/30/20   Lung cancer:  Low Dose CT Chest recommended if Age 73-80 years, 20 pack-year currently smoking OR have quit w/in 15years. Patient does not qualify.   ECG: 09/12/20  Advanced Care Planning: A voluntary discussion about advance care planning including the explanation and discussion of advance directives.  Discussed health care proxy and Living will, and the patient was able to identify a health care proxy as husband.    Lipids: Lab Results  Component Value Date   CHOL 235 (H) 11/09/2021   CHOL 238 (H) 07/11/2020   CHOL 236 (H) 03/22/2019   Lab Results  Component Value Date   HDL 63 11/09/2021   HDL 59 07/11/2020   HDL 55 03/22/2019   Lab Results  Component Value Date   LDLCALC 133 (H) 11/09/2021   LDLCALC 141 (H) 07/11/2020   LDLCALC 142 (H) 03/22/2019   Lab  Results  Component Value Date   TRIG 240 (H) 11/09/2021   TRIG 234 (H) 07/11/2020   TRIG 253 (H) 03/22/2019   Lab Results  Component Value Date   CHOLHDL 3.7 11/09/2021   CHOLHDL 4.0 07/11/2020   CHOLHDL 4.3 03/22/2019   No results found for: LDLDIRECT  Glucose: Glucose  Date Value Ref Range Status  08/27/2013 98 65 - 99 mg/dL Final  19/14/7829 562 (H) 65 - 99 mg/dL Final  13/07/6577 469 (H) 65 - 99 mg/dL Final   Glucose, Bld  Date Value Ref Range Status  11/09/2021 94 65 - 99 mg/dL Final    Comment:    .            Fasting reference interval .   09/11/2020 112 (H) 70 - 99 mg/dL Final    Comment:    Glucose reference range applies only to samples taken after fasting for at least 8 hours.  08/31/2020 109 (H) 70 - 99 mg/dL Final     Comment:    Glucose reference range applies only to samples taken after fasting for at least 8 hours.   Glucose-Capillary  Date Value Ref Range Status  12/13/2015 97 65 - 99 mg/dL Final    Patient Active Problem List   Diagnosis Date Noted   Change in bowel habits 11/23/2020   Hyperlipidemia 11/20/2017   GERD without esophagitis 02/21/2017   Tachycardia 02/20/2016   RLS (restless legs syndrome) 12/21/2015   IBS (irritable bowel syndrome) 12/21/2015   Other long term (current) drug therapy 12/08/2015   Constipation 12/08/2015   Insomnia, persistent 12/08/2015   Dermatitis, eczematoid 12/08/2015   Gravida 2 para 2 12/08/2015   Hypertriglyceridemia 12/08/2015   Metallic taste 12/08/2015   Female climacteric state 12/08/2015   Schizoaffective disorder, bipolar type (HCC) 12/08/2015    Past Surgical History:  Procedure Laterality Date   ANUS SURGERY  2000   [place]   COLONOSCOPY     COLONOSCOPY WITH PROPOFOL N/A 11/30/2020   Procedure: COLONOSCOPY WITH PROPOFOL;  Surgeon: Wyline Mood, MD;  Location: [facility]COPY;  Service: Gastroenterology;  Laterality: N/A;   TUBAL LIGATION  12/02/1994    Family History  Problem Relation Age of Onset   Mental illness Sister        Schizophrenia and bipolar   Cancer Maternal Grandmother        Colon   Mental illness Paternal Grandmother        Schizophrenia   Breast cancer Neg Hx     Social History   Socioeconomic History   Marital status: Married    Spouse name: Viviann Spare   Number of children: 2   Years of education: Not on file   Highest education level: Bachelor's degree (e.g., BA, AB, BS)  Occupational History   Not on file  Tobacco Use   Smoking status: Never   Smokeless tobacco: Never  Vaping Use   Vaping Use: Never used  Substance and Sexual Activity   Alcohol use: No    Alcohol/week: 0.0 standard drinks   Drug use: No   Sexual activity: Not Currently    Partners: Male  Other Topics Concern   Not on file   Social History Narrative   Not on file   Social Determinants of Health   Financial Resource Strain: Low Risk    Difficulty of Paying Living Expenses: Not hard at all  Food Insecurity: No Food Insecurity   Worried About Radiation protection practitioner of Food in the Last Year: Never true  Ran Out of Food in the Last Year: Never true  Transportation Needs: No Transportation Needs   Lack of Transportation (Medical): No   Lack of Transportation (Non-Medical): No  Physical Activity: Sufficiently Active   Days of Exercise per Week: 6 days   Minutes of Exercise per Session: 60 min  Stress: Stress Concern Present   Feeling of Stress : To some extent  Social Connections: Socially Integrated   Frequency of Communication with Friends and Family: Three times a week   Frequency of Social Gatherings with Friends and Family: Three times a week   Attends Religious Services: More than 4 times per year   Active Member of Clubs or Organizations: Yes   Attends Engineer, structural: More than 4 times per year   Marital Status: Married  Catering manager Violence: Not At Risk   Fear of Current or Ex-Partner: No   Emotionally Abused: No   Physically Abused: No   Sexually Abused: No     Current Outpatient Medications:    Ascorbic Acid (VITAMIN C) 500 MG CHEW, Chew by mouth., Disp: , Rfl:    clonazePAM (KLONOPIN) 0.5 MG tablet, TAKE 1 AND 1/2 TABLETS BY MOUTH EVERY MORNING AND EVERY NIGHT AT BEDTIME, Disp: 90 tablet, Rfl: 5   cloZAPine (CLOZARIL) 100 MG tablet, TAKE 1 TABLET BY MOUTH EVERY MORNING AND 3 TABLETS BY MOUTH EVERY NIGHT AT BEDTIME (Patient taking differently: 4 tabs HS), Disp: 120 tablet, Rfl: 6   famotidine (PEPCID) 40 MG tablet, TAKE 1 TABLET(40 MG) BY MOUTH DAILY, Disp: 90 tablet, Rfl: 1   lithium 300 MG tablet, TAKE 2 TABLETS(600 MG) BY MOUTH AT BEDTIME, Disp: 180 tablet, Rfl: 1   Magnesium 400 MG CAPS, Take by mouth., Disp: , Rfl:    metoprolol tartrate (LOPRESSOR) 25 MG tablet, TAKE 1/2  TABLET(12.5 MG) BY MOUTH TWICE DAILY, Disp: 90 tablet, Rfl: 1   TRULANCE 3 MG TABS, TAKE 1 TABLET BY MOUTH DAILY, Disp: 90 tablet, Rfl: 3   Vitamin D, Ergocalciferol, (DRISDOL) 1.25 MG (50000 UNIT) CAPS capsule, Take 1 capsule (50,000 Units total) by mouth every 7 (seven) days., Disp: 12 capsule, Rfl: 1   zinc gluconate 50 MG tablet, Take 50 mg by mouth daily., Disp: , Rfl:   Allergies  Allergen Reactions   Levsin [Hyoscyamine Sulfate] Hives   Escitalopram Hives     ROS  Constitutional: Negative for fever or weight change.  Respiratory: Negative for cough and shortness of breath.   Cardiovascular: Negative for chest pain or palpitations.  Gastrointestinal: Negative for abdominal pain, no bowel changes.  Musculoskeletal: Negative for gait problem or joint swelling.  Skin: Negative for rash.  Neurological: Negative for dizziness or headache.  No other specific complaints in a complete review of systems (except as listed in HPI above).   Objective  Vitals:   02/20/22 1515  BP: 120/72  Pulse: 88  Resp: 16  SpO2: 100%  Weight: 144 lb (65.3 kg)  Height: 5\' 4"  (1.626 m)    Body mass index is 24.72 kg/m.  Physical Exam  Constitutional: Patient appears well-developed and well-nourished. No distress.  HENT: Head: Normocephalic and atraumaticnot done  Eyes: Conjunctivae and EOM are normal. Pupils are equal, round, and reactive to light. No scleral icterus.  Neck: Normal range of motion. Neck supple. No JVD present. No thyromegaly present.  Cardiovascular: Normal rate, regular rhythm and normal heart sounds.  No murmur heard. No BLE edema. Pulmonary/Chest: Effort normal and breath sounds normal. No respiratory distress.  Abdominal: Soft. Bowel sounds are normal, no distension. There is no tenderness. no masses Breast: no lumps or masses, no nipple discharge or rashes FEMALE GENITALIA:  Not done  RECTAL: not done  Musculoskeletal: Normal range of motion, no joint effusions. No  gross deformities Neurological: he is alert and oriented to person, place, and time. No cranial nerve deficit. Coordination, balance, strength, speech and gait are normal.  Skin: Skin is warm and dry. No rash noted. No erythema. Multiple moles, not sure if changing, referral will be placed for dermatologist  Psychiatric: Patient has a normal mood and affect. behavior is normal. Judgment and thought content normal.   Recent Results (from the past 2160 hour(s))  CBC with Differential/Platelet     Status: Abnormal   Collection Time: 12/04/21  1:15 PM  Result Value Ref Range   WBC 8.6 4.0 - 10.5 K/uL   RBC 3.88 3.87 - 5.11 MIL/uL   Hemoglobin 11.8 (L) 12.0 - 15.0 g/dL   HCT 16.1 09.6 - 04.5 %   MCV 95.9 80.0 - 100.0 fL   MCH 30.4 26.0 - 34.0 pg   MCHC 31.7 30.0 - 36.0 g/dL   RDW 40.9 81.1 - 91.4 %   Platelets 254 150 - 400 K/uL   nRBC 0.0 0.0 - 0.2 %   Neutrophils Relative % 65 %   Neutro Abs 5.6 1.7 - 7.7 K/uL   Lymphocytes Relative 26 %   Lymphs Abs 2.3 0.7 - 4.0 K/uL   Monocytes Relative 5 %   Monocytes Absolute 0.5 0.1 - 1.0 K/uL   Eosinophils Relative 3 %   Eosinophils Absolute 0.2 0.0 - 0.5 K/uL   Basophils Relative 1 %   Basophils Absolute 0.1 0.0 - 0.1 K/uL   Immature Granulocytes 0 %   Abs Immature Granulocytes 0.03 0.00 - 0.07 K/uL    Comment: Performed at [facility], [address] [place], [place] [address]  CBC with Differential/Platelet     Status: Abnormal   Collection Time: 12/31/21 12:05 PM  Result Value Ref Range   WBC 8.4 4.0 - 10.5 K/uL   RBC 3.76 (L) 3.87 - 5.11 MIL/uL   Hemoglobin 11.6 (L) 12.0 - 15.0 g/dL   HCT 62.1 30.8 - 65.7 %   MCV 98.1 80.0 - 100.0 fL   MCH 30.9 26.0 - 34.0 pg   MCHC 31.4 30.0 - 36.0 g/dL   RDW 84.6 96.2 - 95.2 %   Platelets 243 150 - 400 K/uL   nRBC 0.0 0.0 - 0.2 %   Neutrophils Relative % 64 %   Neutro Abs 5.3 1.7 - 7.7 K/uL   Lymphocytes Relative 28 %   Lymphs Abs 2.3 0.7 - 4.0 K/uL   Monocytes Relative 6 %    Monocytes Absolute 0.5 0.1 - 1.0 K/uL   Eosinophils Relative 2 %   Eosinophils Absolute 0.2 0.0 - 0.5 K/uL   Basophils Relative 0 %   Basophils Absolute 0.0 0.0 - 0.1 K/uL   Immature Granulocytes 0 %   Abs Immature Granulocytes 0.03 0.00 - 0.07 K/uL    Comment: Performed at [facility], [address] [place], [place] [address]  CBC with Differential/Platelet     Status: Abnormal   Collection Time: 01/30/22 12:28 PM  Result Value Ref Range   WBC 7.8 4.0 - 10.5 K/uL   RBC 4.01 3.87 - 5.11 MIL/uL   Hemoglobin 11.8 (L) 12.0 - 15.0 g/dL   HCT 44.0 10.2 - 72.5 %  MCV 96.3 80.0 - 100.0 fL   MCH 29.4 26.0 - 34.0 pg   MCHC 30.6 30.0 - 36.0 g/dL   RDW 40.9 81.1 - 91.4 %   Platelets 236 150 - 400 K/uL   nRBC 0.0 0.0 - 0.2 %   Neutrophils Relative % 59 %   Neutro Abs 4.6 1.7 - 7.7 K/uL   Lymphocytes Relative 29 %   Lymphs Abs 2.2 0.7 - 4.0 K/uL   Monocytes Relative 6 %   Monocytes Absolute 0.5 0.1 - 1.0 K/uL   Eosinophils Relative 5 %   Eosinophils Absolute 0.4 0.0 - 0.5 K/uL   Basophils Relative 1 %   Basophils Absolute 0.1 0.0 - 0.1 K/uL   Immature Granulocytes 0 %   Abs Immature Granulocytes 0.02 0.00 - 0.07 K/uL    Comment: Performed at [facility], [address], [place], [place] [address]     Fall Risk:    02/20/2022    3:14 PM 01/23/2022    1:39 PM 11/09/2021    3:30 PM 05/14/2021    3:45 PM 02/02/2021   11:21 AM  Fall Risk   Falls in the past year? 1 1 0 1 1  Number falls in past yr: 1 1 0 1 0  Injury with Fall? 1 1 0 0 0  Risk for fall due to : History of fall(s) History of fall(s) No Fall Risks History of fall(s)   Follow up Falls prevention discussed Falls prevention discussed Falls prevention discussed Falls prevention discussed      Functional Status Survey: Is the patient deaf or have difficulty hearing?: No Does the patient have difficulty seeing, even when wearing glasses/contacts?: No Does the patient have difficulty concentrating,  remembering, or making decisions?: Yes Does the patient have difficulty walking or climbing stairs?: Yes Does the patient have difficulty dressing or bathing?: No Does the patient have difficulty doing errands alone such as visiting a doctor's office or shopping?: No   Assessment & Plan  1. Well adult exam  - VITAMIN D 25 Hydroxy (Vit-D Deficiency, Fractures) - Iron, TIBC and Ferritin Panel - Hemoglobin A1c  2. Anemia, unspecified type  - Iron, TIBC and Ferritin Panel  3. Screening for diabetes mellitus  - Hemoglobin A1c  4. Vitamin D deficiency  - VITAMIN D 25 Hydroxy (Vit-D Deficiency, Fractures)  5. Breast cancer screening by mammogram  - MM 3D SCREEN BREAST BILATERAL; Future  6. Need for shingles vaccine  She will contact insurance about coverage  7. Ovarian failure  - DG Bone Density; Future  8. Osteoporosis screening  - DG Bone Density; Future  9. Vaginal dryness, menopausal  - conjugated estrogens (PREMARIN) vaginal cream; Place 1 Applicatorful vaginally daily. For two weeks after that twice weekly  Dispense: 42.5 g; Refill: 12   10. Multiple atypical skin moles  - Ambulatory referral to Dermatology   -USPSTF grade A and B recommendations reviewed with patient; age-appropriate recommendations, preventive care, screening tests, etc discussed and encouraged; healthy living encouraged; see AVS for patient education given to patient -Discussed importance of 150 minutes of physical activity weekly, eat two servings of fish weekly, eat one serving of tree nuts ( cashews, pistachios, pecans, almonds.Marland Kitchen) every other day, eat 6 servings of fruit/vegetables daily and drink plenty of water and avoid sweet beverages.   -Reviewed Health Maintenance: Yes.

## 2022-02-19 NOTE — Patient Instructions (Signed)

## 2022-02-20 ENCOUNTER — Ambulatory Visit (INDEPENDENT_AMBULATORY_CARE_PROVIDER_SITE_OTHER): Payer: 59 | Admitting: Family Medicine

## 2022-02-20 ENCOUNTER — Other Ambulatory Visit: Payer: Self-pay

## 2022-02-20 ENCOUNTER — Encounter: Payer: Self-pay | Admitting: Family Medicine

## 2022-02-20 VITALS — BP 120/72 | HR 88 | Resp 16 | Ht 64.0 in | Wt 144.0 lb

## 2022-02-20 DIAGNOSIS — D649 Anemia, unspecified: Secondary | ICD-10-CM

## 2022-02-20 DIAGNOSIS — N951 Menopausal and female climacteric states: Secondary | ICD-10-CM

## 2022-02-20 DIAGNOSIS — Z23 Encounter for immunization: Secondary | ICD-10-CM

## 2022-02-20 DIAGNOSIS — E559 Vitamin D deficiency, unspecified: Secondary | ICD-10-CM

## 2022-02-20 DIAGNOSIS — E2839 Other primary ovarian failure: Secondary | ICD-10-CM

## 2022-02-20 DIAGNOSIS — Z131 Encounter for screening for diabetes mellitus: Secondary | ICD-10-CM

## 2022-02-20 DIAGNOSIS — Z0000 Encounter for general adult medical examination without abnormal findings: Secondary | ICD-10-CM | POA: Diagnosis not present

## 2022-02-20 DIAGNOSIS — D229 Melanocytic nevi, unspecified: Secondary | ICD-10-CM

## 2022-02-20 DIAGNOSIS — Z13820 Encounter for screening for osteoporosis: Secondary | ICD-10-CM

## 2022-02-20 DIAGNOSIS — Z1231 Encounter for screening mammogram for malignant neoplasm of breast: Secondary | ICD-10-CM

## 2022-02-20 MED ORDER — PREMARIN 0.625 MG/GM VA CREA: 1.0000 | TOPICAL_CREAM | Freq: Every day | VAGINAL | 12 refills | Status: DC

## 2022-02-21 ENCOUNTER — Telehealth: Payer: Self-pay

## 2022-02-21 LAB — HEMOGLOBIN A1C
Hgb A1c MFr Bld: 5 % of total Hgb (ref <5.7)
Mean Plasma Glucose: 97 mg/dL
eAG (mmol/L): 5.4 mmol/L

## 2022-02-21 LAB — IRON,TIBC AND FERRITIN PANEL
%SAT: 18 % (calc) (ref 16–45)
Ferritin: 37 ng/mL (ref 16–232)
Iron: 61 ug/dL (ref 45–160)
TIBC: 343 mcg/dL (calc) (ref 250–450)

## 2022-02-21 LAB — VITAMIN D 25 HYDROXY (VIT D DEFICIENCY, FRACTURES): Vit D, 25-Hydroxy: 60 ng/mL (ref 30–100)

## 2022-02-21 MED ORDER — VITAMIN D (ERGOCALCIFEROL) 1.25 MG (50000 UNIT) PO CAPS: 50000.0000 [IU] | ORAL_CAPSULE | ORAL | 1 refills | Status: DC

## 2022-02-21 NOTE — Telephone Encounter (Signed)
Copied from [place] [id number]. Topic: Referral - Request for Referral ?>> Feb 21, 2022  3:17 PM Loma Boston wrote: ?Reason for CRM: Pt states she had talked to Dr Ancil Boozer in an appt yesterday about a Dermatology and she states it is not done. She prefers [facility]. This was for the mole on her left ear. Please advise/ [phone] ?

## 2022-02-25 ENCOUNTER — Other Ambulatory Visit: Payer: Self-pay

## 2022-02-25 ENCOUNTER — Other Ambulatory Visit
Admission: RE | Admit: 2022-02-25 | Discharge: 2022-02-25 | Disposition: A | Discharge status: Home / Self Care | Payer: 59 | Attending: Psychiatry | Admitting: Psychiatry

## 2022-02-25 DIAGNOSIS — Z79899 Other long term (current) drug therapy: Secondary | ICD-10-CM | POA: Insufficient documentation

## 2022-02-25 DIAGNOSIS — F250 Schizoaffective disorder, bipolar type: Secondary | ICD-10-CM | POA: Insufficient documentation

## 2022-02-25 LAB — CBC WITH DIFFERENTIAL/PLATELET
Abs Immature Granulocytes: 0.02 10*3/uL (ref 0.00–0.07)
Basophils Absolute: 0.1 10*3/uL (ref 0.0–0.1)
Basophils Relative: 1 %
Eosinophils Absolute: 0.3 10*3/uL (ref 0.0–0.5)
Eosinophils Relative: 3 %
HCT: 38 % (ref 36.0–46.0)
Hemoglobin: 11.7 g/dL — ABNORMAL LOW (ref 12.0–15.0)
Immature Granulocytes: 0 %
Lymphocytes Relative: 26 %
Lymphs Abs: 2.1 10*3/uL (ref 0.7–4.0)
MCH: 29.4 pg (ref 26.0–34.0)
MCHC: 30.8 g/dL (ref 30.0–36.0)
MCV: 95.5 fL (ref 80.0–100.0)
Monocytes Absolute: 0.5 10*3/uL (ref 0.1–1.0)
Monocytes Relative: 6 %
Neutro Abs: 5.4 10*3/uL (ref 1.7–7.7)
Neutrophils Relative %: 64 %
Platelets: 271 10*3/uL (ref 150–400)
RBC: 3.98 MIL/uL (ref 3.87–5.11)
RDW: 12.9 % (ref 11.5–15.5)
WBC: 8.3 10*3/uL (ref 4.0–10.5)
nRBC: 0 % (ref 0.0–0.2)

## 2022-02-25 NOTE — Addendum Note (Signed)
Addended by: Steele Sizer F on: 02/25/2022 10:29 AM ? ? Modules accepted: Orders ? ?

## 2022-02-26 ENCOUNTER — Telehealth: Payer: Self-pay

## 2022-02-26 NOTE — Telephone Encounter (Signed)
Wanted to see if she needs a refill on her lactolose she still has some but out of refills ?

## 2022-03-25 ENCOUNTER — Other Ambulatory Visit
Admission: RE | Admit: 2022-03-25 | Discharge: 2022-03-25 | Disposition: A | Discharge status: Home / Self Care | Payer: 59 | Attending: Psychiatry | Admitting: Psychiatry

## 2022-03-25 DIAGNOSIS — F250 Schizoaffective disorder, bipolar type: Secondary | ICD-10-CM | POA: Diagnosis present

## 2022-03-25 DIAGNOSIS — Z79899 Other long term (current) drug therapy: Secondary | ICD-10-CM | POA: Diagnosis not present

## 2022-03-25 LAB — CBC WITH DIFFERENTIAL/PLATELET
Abs Immature Granulocytes: 0.02 10*3/uL (ref 0.00–0.07)
Basophils Absolute: 0.1 10*3/uL (ref 0.0–0.1)
Basophils Relative: 1 %
Eosinophils Absolute: 0.3 10*3/uL (ref 0.0–0.5)
Eosinophils Relative: 3 %
HCT: 36.5 % (ref 36.0–46.0)
Hemoglobin: 11.4 g/dL — ABNORMAL LOW (ref 12.0–15.0)
Immature Granulocytes: 0 %
Lymphocytes Relative: 24 %
Lymphs Abs: 2.2 10*3/uL (ref 0.7–4.0)
MCH: 29.8 pg (ref 26.0–34.0)
MCHC: 31.2 g/dL (ref 30.0–36.0)
MCV: 95.5 fL (ref 80.0–100.0)
Monocytes Absolute: 0.6 10*3/uL (ref 0.1–1.0)
Monocytes Relative: 7 %
Neutro Abs: 6.1 10*3/uL (ref 1.7–7.7)
Neutrophils Relative %: 65 %
Platelets: 350 10*3/uL (ref 150–400)
RBC: 3.82 MIL/uL — ABNORMAL LOW (ref 3.87–5.11)
RDW: 12.5 % (ref 11.5–15.5)
WBC: 9.3 10*3/uL (ref 4.0–10.5)
nRBC: 0 % (ref 0.0–0.2)

## 2022-04-22 ENCOUNTER — Telehealth: Payer: Self-pay | Admitting: Psychiatry

## 2022-04-22 ENCOUNTER — Other Ambulatory Visit: Payer: Self-pay | Admitting: Psychiatry

## 2022-04-22 DIAGNOSIS — Z79899 Other long term (current) drug therapy: Secondary | ICD-10-CM

## 2022-04-22 DIAGNOSIS — F250 Schizoaffective disorder, bipolar type: Secondary | ICD-10-CM

## 2022-04-22 NOTE — Telephone Encounter (Signed)
Noted will send a new order and contact them

## 2022-04-22 NOTE — Telephone Encounter (Signed)
Rob Broom (?) at [facility] LVM @ 1:14.  He says he needs a lab order for pt.  Next appt 8/9

## 2022-04-23 ENCOUNTER — Other Ambulatory Visit
Admission: RE | Admit: 2022-04-23 | Discharge: 2022-04-23 | Disposition: A | Discharge status: Home / Self Care | Payer: 59 | Attending: Psychiatry | Admitting: Psychiatry

## 2022-04-23 DIAGNOSIS — F250 Schizoaffective disorder, bipolar type: Secondary | ICD-10-CM | POA: Insufficient documentation

## 2022-04-23 DIAGNOSIS — Z79899 Other long term (current) drug therapy: Secondary | ICD-10-CM | POA: Diagnosis not present

## 2022-04-23 LAB — CBC WITH DIFFERENTIAL/PLATELET
Abs Immature Granulocytes: 0.04 10*3/uL (ref 0.00–0.07)
Basophils Absolute: 0.1 10*3/uL (ref 0.0–0.1)
Basophils Relative: 1 %
Eosinophils Absolute: 0.2 10*3/uL (ref 0.0–0.5)
Eosinophils Relative: 2 %
HCT: 38.8 % (ref 36.0–46.0)
Hemoglobin: 12 g/dL (ref 12.0–15.0)
Immature Granulocytes: 0 %
Lymphocytes Relative: 18 %
Lymphs Abs: 1.9 10*3/uL (ref 0.7–4.0)
MCH: 29.4 pg (ref 26.0–34.0)
MCHC: 30.9 g/dL (ref 30.0–36.0)
MCV: 95.1 fL (ref 80.0–100.0)
Monocytes Absolute: 0.6 10*3/uL (ref 0.1–1.0)
Monocytes Relative: 6 %
Neutro Abs: 7.7 10*3/uL (ref 1.7–7.7)
Neutrophils Relative %: 73 %
Platelets: 249 10*3/uL (ref 150–400)
RBC: 4.08 MIL/uL (ref 3.87–5.11)
RDW: 13.2 % (ref 11.5–15.5)
WBC: 10.5 10*3/uL (ref 4.0–10.5)
nRBC: 0 % (ref 0.0–0.2)

## 2022-04-26 ENCOUNTER — Telehealth: Payer: Self-pay | Admitting: Psychiatry

## 2022-04-26 NOTE — Telephone Encounter (Signed)
Pt gets labs @ [facility]  fax: [place] husband got fax # from hospital

## 2022-05-14 NOTE — Progress Notes (Unsigned)
Name: Carla Thompson   MRN: [id number]    DOB: Jun 02, 1961   Date:05/15/2022       Progress Note  Subjective  Chief Complaint  Follow up   HPI  Neck weakness: she has noticed that over the past  year,  she is able to look up and walks on a regular basis but keeping head up is tiring . She spends a lot of time looking down -reading and also the computer   GERD:  she is now taking Famotidine every day and seems to be helping with symptoms, very seldom has nausea but per Dr. Clovis Pu secondary to psychiatric medication. Stable   Chronic constipation: she is taking Trulance daily and when she feels constipated takes Lactulose prn.Under the care of Dr. Vicente Males ,no longer having soiling. Doing well    Schizophrenia: Seeing psychiatrist, Dr. Clovis Pu  . No longer has hallucinations. She has been cooking and cleaning again, she also likes to read . Able to have friends over.  No paranoia or hallucinations. She does not drive because of fear or getting in an accident secondary to psychiatric medications. She has two grandsons now  from her daughter Carla Thompson    Tachycardia: doing well on Lopressor half pill twice daily, she denies SOB or chest pain, denies palpitation.Doing well    Hypertriglyceridemia: on life style modification only. Eating oatmeal three days a week   The 10-year ASCVD risk score (Arnett DK, et al., 2019) is: 2.5%   Values used to calculate the score:     Age: 61 years     Sex: Female     Is Non-Hispanic African American: No     Diabetic: No     Tobacco smoker: No     Systolic Blood Pressure: 062 mmHg     Is BP treated: No     HDL Cholesterol: 63 mg/dL     Total Cholesterol: 235 mg/dL    Patient Active Problem List   Diagnosis Date Noted   Change in bowel habits 11/23/2020   Hyperlipidemia 11/20/2017   GERD without esophagitis 02/21/2017   Tachycardia 02/20/2016   RLS (restless legs syndrome) 12/21/2015   IBS (irritable bowel syndrome) 12/21/2015   Other long term  (current) drug therapy 12/08/2015   Constipation 12/08/2015   Insomnia, persistent 12/08/2015   Dermatitis, eczematoid 12/08/2015   Gravida 2 para 2 12/08/2015   Hypertriglyceridemia 37/62/8315   Metallic taste 17/61/6073   Female climacteric state 12/08/2015   Schizoaffective disorder, bipolar type ([place]) 12/08/2015    Past Surgical History:  Procedure Laterality Date   ANUS SURGERY  2000   [place]   COLONOSCOPY     COLONOSCOPY WITH PROPOFOL N/A 11/30/2020   Procedure: COLONOSCOPY WITH PROPOFOL;  Surgeon: [name], MD;  Location[facility] END[facility]COPY;  Service: Gastroenterology;  Laterality: N/A;   TUBAL LIGATION  12/02/1994    Family History  Problem Relation Age of Onset   Mental illness Sister        Schizophrenia and bipolar   Cancer Maternal Grandmother        Colon   Mental illness Paternal Grandmother        Schizophrenia   Breast cancer Neg Hx     Social History   Tobacco Use   Smoking status: Never   Smokeless tobacco: Never  Substance Use Topics   Alcohol use: No    Alcohol/week: 0.0 standard drinks of alcohol     Current Outpatient Medications:    Ascorbic Acid (VITAMIN C) 500 MG  CHEW, Chew by mouth., Disp: , Rfl:    clonazePAM (KLONOPIN) 0.5 MG tablet, TAKE 1 AND 1/2 TABLETS BY MOUTH EVERY MORNING AND EVERY NIGHT AT BEDTIME, Disp: 90 tablet, Rfl: 5   cloZAPine (CLOZARIL) 100 MG tablet, TAKE 1 TABLET BY MOUTH EVERY MORNING AND 3 TABLETS BY MOUTH EVERY NIGHT AT BEDTIME (Patient taking differently: 4 tabs HS), Disp: 120 tablet, Rfl: 6   famotidine (PEPCID) 40 MG tablet, TAKE 1 TABLET(40 MG) BY MOUTH DAILY, Disp: 90 tablet, Rfl: 1   lithium 300 MG tablet, TAKE 2 TABLETS(600 MG) BY MOUTH AT BEDTIME, Disp: 180 tablet, Rfl: 1   Magnesium 400 MG CAPS, Take by mouth., Disp: , Rfl:    metoprolol tartrate (LOPRESSOR) 25 MG tablet, TAKE 1/2 TABLET(12.5 MG) BY MOUTH TWICE DAILY, Disp: 90 tablet, Rfl: 1   TRULANCE 3 MG TABS, TAKE 1 TABLET BY MOUTH DAILY, Disp: 90  tablet, Rfl: 3   Vitamin D, Ergocalciferol, (DRISDOL) 1.25 MG (50000 UNIT) CAPS capsule, Take 1 capsule (50,000 Units total) by mouth every 7 (seven) days., Disp: 12 capsule, Rfl: 1   zinc gluconate 50 MG tablet, Take 50 mg by mouth daily., Disp: , Rfl:    conjugated estrogens (PREMARIN) vaginal cream, Place 1 Applicatorful vaginally daily. For two weeks after that twice weekly (Patient not taking: Reported on 05/15/2022), Disp: 42.5 g, Rfl: 12  Allergies  Allergen Reactions   Levsin [Hyoscyamine Sulfate] Hives   Escitalopram Hives    I personally reviewed active problem list, medication list, allergies, family history, social history with the patient/caregiver today.   ROS  Constitutional: Negative for fever or weight change.  Respiratory: Negative for cough and shortness of breath.   Cardiovascular: Negative for chest pain or palpitations.  Gastrointestinal: Negative for abdominal pain, no bowel changes.  Musculoskeletal: Negative for gait problem or joint swelling.  Skin: Negative for rash.  Neurological: Negative for dizziness or headache.  No other specific complaints in a complete review of systems (except as listed in HPI above).   Objective  Vitals:   05/15/22 1532  BP: 116/74  Pulse: 86  Resp: 16  SpO2: 99%  Weight: 144 lb (65.3 kg)  Height: '5\' 4"'$  (1.626 m)    Body mass index is 24.72 kg/m.  Physical Exam  Constitutional: Patient appears well-developed and well-nourished.  No distress.  HEENT: head atraumatic, normocephalic, pupils equal and reactive to light, neck supple Cardiovascular: Normal rate, regular rhythm and normal heart sounds.  No murmur heard. No BLE edema. Pulmonary/Chest: Effort normal and breath sounds normal. No respiratory distress. Abdominal: Soft.  There is no tenderness. Psychiatric: Patient has a normal mood and affect. behavior is normal. Judgment and thought content normal.  Muscular skeletal: still has weak muscles, but able to lift it  up   Recent Results (from the past 2160 hour(s))  VITAMIN D 25 Hydroxy (Vit-D Deficiency, Fractures)     Status: None   Collection Time: 02/20/22  4:25 PM  Result Value Ref Range   Vit D, 25-Hydroxy 60 30 - 100 ng/mL    Comment: Vitamin D Status         25-OH Vitamin D: . Deficiency:                    <20 ng/mL Insufficiency:             20 - 29 ng/mL Optimal:                 > or =  30 ng/mL . For 25-OH Vitamin D testing on patients on  D2-supplementation and patients for whom quantitation  of D2 and D3 fractions is required, the [employer](TM) 25-OH VIT D, (D2,D3), LC/MS/MS is recommended: order  code [id number] (patients >16yr). See Note 1 . Note 1 . For additional information, please refer to  http://education.[facility].com/faq/FAQ199  (This link is being provided for informational/ educational purposes only.)   Iron, TIBC and Ferritin Panel     Status: None   Collection Time: 02/20/22  4:25 PM  Result Value Ref Range   Iron 61 45 - 160 mcg/dL   TIBC 343 250 - 450 mcg/dL (calc)   %SAT 18 16 - 45 % (calc)   Ferritin 37 16 - 232 ng/mL  Hemoglobin A1c     Status: None   Collection Time: 02/20/22  4:25 PM  Result Value Ref Range   Hgb A1c MFr Bld 5.0 <5.7 % of total Hgb    Comment: For the purpose of screening for the presence of diabetes: . <5.7%       Consistent with the absence of diabetes 5.7-6.4%    Consistent with increased risk for diabetes             (prediabetes) > or =6.5%  Consistent with diabetes . This assay result is consistent with a decreased risk of diabetes. . Currently, no consensus exists regarding use of hemoglobin A1c for diagnosis of diabetes in children. . According to American Diabetes Association (ADA) guidelines, hemoglobin A1c <7.0% represents optimal control in non-pregnant diabetic patients. Different metrics may apply to specific patient populations.  Standards of Medical Care in Diabetes(ADA). .    Mean Plasma Glucose 97  mg/dL   eAG (mmol/L) 5.4 mmol/L  CBC with Differential/Platelet     Status: Abnormal   Collection Time: 02/25/22 12:25 PM  Result Value Ref Range   WBC 8.3 4.0 - 10.5 K/uL   RBC 3.98 3.87 - 5.11 MIL/uL   Hemoglobin 11.7 (L) 12.0 - 15.0 g/dL   HCT 38.0 36.0 - 46.0 %   MCV 95.5 80.0 - 100.0 fL   MCH 29.4 26.0 - 34.0 pg   MCHC 30.8 30.0 - 36.0 g/dL   RDW 12.9 11.5 - 15.5 %   Platelets 271 150 - 400 K/uL   nRBC 0.0 0.0 - 0.2 %   Neutrophils Relative % 64 %   Neutro Abs 5.4 1.7 - 7.7 K/uL   Lymphocytes Relative 26 %   Lymphs Abs 2.1 0.7 - 4.0 K/uL   Monocytes Relative 6 %   Monocytes Absolute 0.5 0.1 - 1.0 K/uL   Eosinophils Relative 3 %   Eosinophils Absolute 0.3 0.0 - 0.5 K/uL   Basophils Relative 1 %   Basophils Absolute 0.1 0.0 - 0.1 K/uL   Immature Granulocytes 0 %   Abs Immature Granulocytes 0.02 0.00 - 0.07 K/uL    Comment: [facility] [address] [place] [id number] CBC with Differential/Platelet     Status: Abnormal   Collection Time: 03/25/22 11:52 AM  Result Value Ref Range   WBC 9.3 4.0 - 10.5 K/uL   RBC 3.82 (L) 3.87 - 5.11 MIL/uL   Hemoglobin 11.4 (L) 12.0 - 15.0 g/dL   HCT 36.5 36.0 - 46.0 %   MCV 95.5 80.0 - 100.0 fL   MCH 29.8 26.0 - 34.0 pg   MCHC 31.2 30.0 - 36.0 g/dL   RDW 12.5 11.5 - 15.5 %   Platelets 350 150 -  400 K/uL   nRBC 0.0 0.0 - 0.2 %   Neutrophils Relative % 65 %   Neutro Abs 6.1 1.7 - 7.7 K/uL   Lymphocytes Relative 24 %   Lymphs Abs 2.2 0.7 - 4.0 K/uL   Monocytes Relative 7 %   Monocytes Absolute 0.6 0.1 - 1.0 K/uL   Eosinophils Relative 3 %   Eosinophils Absolute 0.3 0.0 - 0.5 K/uL   Basophils Relative 1 %   Basophils Absolute 0.1 0.0 - 0.1 K/uL   Immature Granulocytes 0 %   Abs Immature Granulocytes 0.02 0.00 - 0.07 K/uL    Comment: Performed at [facility], [address] [place], [place] [address]  CBC with Differential/Platelet     Status: None   Collection Time: 04/23/22 11:48 AM   Result Value Ref Range   WBC 10.5 4.0 - 10.5 K/uL   RBC 4.08 3.87 - 5.11 MIL/uL   Hemoglobin 12.0 12.0 - 15.0 g/dL   HCT 38.8 36.0 - 46.0 %   MCV 95.1 80.0 - 100.0 fL   MCH 29.4 26.0 - 34.0 pg   MCHC 30.9 30.0 - 36.0 g/dL   RDW 13.2 11.5 - 15.5 %   Platelets 249 150 - 400 K/uL   nRBC 0.0 0.0 - 0.2 %   Neutrophils Relative % 73 %   Neutro Abs 7.7 1.7 - 7.7 K/uL   Lymphocytes Relative 18 %   Lymphs Abs 1.9 0.7 - 4.0 K/uL   Monocytes Relative 6 %   Monocytes Absolute 0.6 0.1 - 1.0 K/uL   Eosinophils Relative 2 %   Eosinophils Absolute 0.2 0.0 - 0.5 K/uL   Basophils Relative 1 %   Basophils Absolute 0.1 0.0 - 0.1 K/uL   Immature Granulocytes 0 %   Abs Immature Granulocytes 0.04 0.00 - 0.07 K/uL    Comment: Performed at [facility], [address] [place], [place] [address]    PHQ2/9:    05/15/2022    3:32 PM 02/20/2022    3:14 PM 01/23/2022    1:39 PM 11/09/2021    3:30 PM 05/14/2021    3:46 PM  Depression screen PHQ 2/9  Decreased Interest 0 0 0 0 1  Down, Depressed, Hopeless 0 0 0 0 0  PHQ - 2 Score 0 0 0 0 1  Altered sleeping  0 0 0 1  Tired, decreased energy  0 0 0 1  Change in appetite  0 0 0 0  Feeling bad or failure about yourself   0 0 0 0  Trouble concentrating  0 0 0 2  Moving slowly or fidgety/restless  0 0 0 3  Suicidal thoughts  0 0 0 0  PHQ-9 Score  0 0 0 8  Difficult doing work/chores    Not difficult at all Somewhat difficult    phq 9 is negative   Fall Risk:    05/15/2022    3:32 PM 02/20/2022    3:14 PM 01/23/2022    1:39 PM 11/09/2021    3:30 PM 05/14/2021    3:45 PM  Fall Risk   Falls in the past year? '1 1 1 '$ 0 1  Number falls in past yr: 0 1 1 0 1  Injury with Fall? '1 1 1 '$ 0 0  Risk for fall due to : No Fall Risks History of fall(s) History of fall(s) No Fall Risks History of fall(s)  Follow up Falls prevention discussed Falls prevention discussed Falls prevention discussed Falls prevention discussed  Falls prevention discussed       Functional Status Survey: Is the patient deaf or have difficulty hearing?: No Does the patient have difficulty seeing, even when wearing glasses/contacts?: No Does the patient have difficulty concentrating, remembering, or making decisions?: No Does the patient have difficulty walking or climbing stairs?: Yes Does the patient have difficulty dressing or bathing?: No Does the patient have difficulty doing errands alone such as visiting a doctor's office or shopping?: No    Assessment & Plan  1. GERD without esophagitis  Controlled  2. Tachycardia  - metoprolol tartrate (LOPRESSOR) 25 MG tablet; Take 0.5 tablets (12.5 mg total) by mouth 2 (two) times daily.  Dispense: 90 tablet; Refill: 1  3. Schizoaffective disorder, bipolar type ([place])  Keep regular follow ups with psychiatrist, doing well   4. Vitamin D deficiency   5. Irritable bowel syndrome with constipation  Controlled with medications  6. Vaginal dryness, menopausal  Stopped medications since no improvement, she has been using lub   7. Neck muscle weakness   Discussed importance of home PT

## 2022-05-15 ENCOUNTER — Ambulatory Visit (INDEPENDENT_AMBULATORY_CARE_PROVIDER_SITE_OTHER): Payer: 59 | Admitting: Family Medicine

## 2022-05-15 ENCOUNTER — Encounter: Payer: Self-pay | Admitting: Family Medicine

## 2022-05-15 VITALS — BP 116/74 | HR 86 | Resp 16 | Ht 64.0 in | Wt 144.0 lb

## 2022-05-15 DIAGNOSIS — F250 Schizoaffective disorder, bipolar type: Secondary | ICD-10-CM

## 2022-05-15 DIAGNOSIS — E559 Vitamin D deficiency, unspecified: Secondary | ICD-10-CM

## 2022-05-15 DIAGNOSIS — K219 Gastro-esophageal reflux disease without esophagitis: Secondary | ICD-10-CM | POA: Diagnosis not present

## 2022-05-15 DIAGNOSIS — R000 Tachycardia, unspecified: Secondary | ICD-10-CM | POA: Diagnosis not present

## 2022-05-15 DIAGNOSIS — M5382 Other specified dorsopathies, cervical region: Secondary | ICD-10-CM

## 2022-05-15 DIAGNOSIS — N951 Menopausal and female climacteric states: Secondary | ICD-10-CM

## 2022-05-15 DIAGNOSIS — K581 Irritable bowel syndrome with constipation: Secondary | ICD-10-CM

## 2022-05-15 MED ORDER — METOPROLOL TARTRATE 25 MG PO TABS: 12.5000 mg | ORAL_TABLET | Freq: Two times a day (BID) | ORAL | 1 refills | Status: DC

## 2022-05-20 ENCOUNTER — Other Ambulatory Visit
Admission: RE | Admit: 2022-05-20 | Discharge: 2022-05-20 | Disposition: A | Discharge status: Home / Self Care | Payer: 59 | Attending: Psychiatry | Admitting: Psychiatry

## 2022-05-20 DIAGNOSIS — Z79899 Other long term (current) drug therapy: Secondary | ICD-10-CM | POA: Diagnosis not present

## 2022-05-20 DIAGNOSIS — F250 Schizoaffective disorder, bipolar type: Secondary | ICD-10-CM | POA: Insufficient documentation

## 2022-05-20 LAB — CBC WITH DIFFERENTIAL/PLATELET
Abs Immature Granulocytes: 0.02 10*3/uL (ref 0.00–0.07)
Basophils Absolute: 0.1 10*3/uL (ref 0.0–0.1)
Basophils Relative: 1 %
Eosinophils Absolute: 0.2 10*3/uL (ref 0.0–0.5)
Eosinophils Relative: 2 %
HCT: 36.8 % (ref 36.0–46.0)
Hemoglobin: 11.7 g/dL — ABNORMAL LOW (ref 12.0–15.0)
Immature Granulocytes: 0 %
Lymphocytes Relative: 20 %
Lymphs Abs: 1.9 10*3/uL (ref 0.7–4.0)
MCH: 29.7 pg (ref 26.0–34.0)
MCHC: 31.8 g/dL (ref 30.0–36.0)
MCV: 93.4 fL (ref 80.0–100.0)
Monocytes Absolute: 0.6 10*3/uL (ref 0.1–1.0)
Monocytes Relative: 6 %
Neutro Abs: 6.8 10*3/uL (ref 1.7–7.7)
Neutrophils Relative %: 71 %
Platelets: 253 10*3/uL (ref 150–400)
RBC: 3.94 MIL/uL (ref 3.87–5.11)
RDW: 13 % (ref 11.5–15.5)
WBC: 9.6 10*3/uL (ref 4.0–10.5)
nRBC: 0 % (ref 0.0–0.2)

## 2022-05-23 ENCOUNTER — Telehealth: Payer: Self-pay | Admitting: Psychiatry

## 2022-05-23 NOTE — Telephone Encounter (Signed)
They were already put in [facility] yestrday

## 2022-05-23 NOTE — Telephone Encounter (Signed)
Carla Thompson called and needs her lab work faxed to   [facility], [place] [facility] [place]  Phone:  [phone]  Fax:  [phone]   She needs her Clozapine filled.

## 2022-05-23 NOTE — Telephone Encounter (Signed)
See message. Labs done 6/19.

## 2022-06-17 ENCOUNTER — Encounter
Admission: RE | Admit: 2022-06-17 | Discharge: 2022-06-17 | Disposition: A | Discharge status: Home / Self Care | Payer: 59 | Attending: Psychiatry | Admitting: Psychiatry

## 2022-06-17 DIAGNOSIS — F250 Schizoaffective disorder, bipolar type: Secondary | ICD-10-CM | POA: Diagnosis present

## 2022-06-17 DIAGNOSIS — Z79899 Other long term (current) drug therapy: Secondary | ICD-10-CM | POA: Insufficient documentation

## 2022-06-17 LAB — CBC WITH DIFFERENTIAL/PLATELET
Abs Immature Granulocytes: 0.03 10*3/uL (ref 0.00–0.07)
Basophils Absolute: 0.1 10*3/uL (ref 0.0–0.1)
Basophils Relative: 1 %
Eosinophils Absolute: 0.2 10*3/uL (ref 0.0–0.5)
Eosinophils Relative: 3 %
HCT: 38.1 % (ref 36.0–46.0)
Hemoglobin: 12 g/dL (ref 12.0–15.0)
Immature Granulocytes: 0 %
Lymphocytes Relative: 24 %
Lymphs Abs: 2 10*3/uL (ref 0.7–4.0)
MCH: 29.6 pg (ref 26.0–34.0)
MCHC: 31.5 g/dL (ref 30.0–36.0)
MCV: 94.1 fL (ref 80.0–100.0)
Monocytes Absolute: 0.6 10*3/uL (ref 0.1–1.0)
Monocytes Relative: 7 %
Neutro Abs: 5.5 10*3/uL (ref 1.7–7.7)
Neutrophils Relative %: 65 %
Platelets: 240 10*3/uL (ref 150–400)
RBC: 4.05 MIL/uL (ref 3.87–5.11)
RDW: 13.2 % (ref 11.5–15.5)
WBC: 8.3 10*3/uL (ref 4.0–10.5)
nRBC: 0 % (ref 0.0–0.2)

## 2022-07-10 ENCOUNTER — Encounter: Payer: Self-pay | Admitting: Psychiatry

## 2022-07-10 ENCOUNTER — Ambulatory Visit (INDEPENDENT_AMBULATORY_CARE_PROVIDER_SITE_OTHER): Payer: 59 | Admitting: Psychiatry

## 2022-07-10 DIAGNOSIS — F250 Schizoaffective disorder, bipolar type: Secondary | ICD-10-CM | POA: Diagnosis not present

## 2022-07-10 DIAGNOSIS — F4001 Agoraphobia with panic disorder: Secondary | ICD-10-CM | POA: Diagnosis not present

## 2022-07-10 DIAGNOSIS — K5909 Other constipation: Secondary | ICD-10-CM | POA: Diagnosis not present

## 2022-07-10 DIAGNOSIS — Z79899 Other long term (current) drug therapy: Secondary | ICD-10-CM

## 2022-07-10 MED ORDER — LITHIUM CARBONATE 300 MG PO TABS: ORAL_TABLET | ORAL | 1 refills | Status: DC

## 2022-07-10 MED ORDER — CLONAZEPAM 0.5 MG PO TABS: ORAL_TABLET | ORAL | 5 refills | Status: DC

## 2022-07-10 MED ORDER — CLOZAPINE 100 MG PO TABS: ORAL_TABLET | ORAL | 6 refills | Status: DC

## 2022-07-10 NOTE — Progress Notes (Signed)
Carla Thompson [id number] Sep 14, 1961 61 y.o.  Subjective:   Patient ID:  Carla Thompson is a 61 y.o. (DOB 06-03-1961) female.  Chief Complaint:  Chief Complaint  Patient presents with   Follow-up    Schizoaffective disorder, bipolar type [facility])    HPI Carla Thompson presents to the office today for follow-up of schizoaffective disorder.  visit October, 2020.  No med changes.  She continued on clozapine 400, lithium 600, and clonazepam 0.5 twice daily as needed.   03/09/20 appt noted: StepF had been sick with Covid and other problems.  Died.  Other family members with Covid problems too and died. [name] worked from home 11-08-23 to April  Good overall without complaints.  Stress dealing with Covid.  Trying to stay active including cooking.  No major episodes of confusion nor voices.    No fear.  Does emails on the computer.  Not much anxiety lately.  Sleep same 9-9.  No fear episodes.  No manic nor depressive episodes.  No significant panic attacks recently.  She is not avoiding things out of anxiety generally.  She's still active at church.   Large infrequent BM clog toilet and intermittently not well managed.  Plan: Still problems with large infrequent q 4-7 days, large BM.  Often clogs the toilet.  Not well managed. Increase Linzess trial to 290 mg daily to see if it's better managed.  09/07/20 appt with following noted: She has notes. Balance problems, movements in feet legs, stomach.  Marching movements in feet.  Scares her. 1 fall on stairs going up. Wonders about reducing clozapine to 3 instead of 4 bc of tiredness. B researched lithium and said lithium and clozapine are "bad medicines with a lot of SE".   she's worried about SE. Disc ran out of clozapine for 2 weeks and decompensated.  back on it for almost a month.  Pharmacy was saying they couldn't dispense clozapine without labs and they didn't have the labs.  H says she's not back to normal yet bc can't focus and make simple  decisions.  For example couldn't decide whether to drink water out of the faucet or out of Brita water filter.  Lost 10# and not eating well.  Poor appetite.   Severely constipated chronically.  Using miralax.  Plan: continue clozapine  11/09/20 appt with following noted:  Seen with Loretha Brasil [place] problems with chronic constipation and using Miralax twice daily.  It is helpful but varies.  Doesn't like clozapine for this reason.  Usual trouble is leakage from the stool.  Disc sphincter exercises.   Lost weight. Some depression over the TD including feet moving up and down.  Someitmes stomach moves.  Occ tremor but not lately.  Trouble with sleep, initial and middle. Some of the problems with getting clozapine may have been related to not getting th labs to the correct [facility] in [place].  Doesn't like prunes or prune juice..  Not attending church due to having to wear depends and "tardive dyskinesia".   Plan: Can switch to clonazepam 0.5 mg in am and 1.0 mg HS  02/20/2021 appointment with the following noted: Walks outside.   [name] got Covid for 3 weeks.  She got milder sx with assumption she had it also. No problems getting clozapine since here.  Have worked on getting it more consistently and that seems to be working.    Better than 6 mos ago.  Stronger physically and mentally.  No sig panic attacks now. Balance  is better.  Weight loss stopped.  Not depressed.   Better with more consistency with clozapine. Exercising more and that's helped.  Plan: Continue clozapine 400 mg HS  (She prefers all at once at night) Contnue lithium 600 mg HS Panic disorder controlled. clonazepam 0.75 mg BID   07/26/2021 appointment with the following noted: seen with Loretha Brasil Hard time swallowing pills.  Disc jury duty.  She doesn't feel capable.  Doesn't drive.   Anxious about taking pills but otherwise is usually doing ok.  Tryitng to walk 6 times weekly and physically feels better.   Patient reports stable mood  and denies depressed or irritable moods.  Patient denies any recent difficulty with anxiety.  Patient denies difficulty with sleep initiation or maintenance. Denies appetite disturbance.  Patient reports that energy and motivation have been good.  Patient with chronic difficulty with concentration.  Patient denies any suicidal ideation. No problems getting clozapine lately. Plan: No med changes  01/22/2022 appointment with the following noted: Continues clozapine 400 mg nightly, lithium 600 mg nightly, clonazepam 0.75 mg twice daily For the most part doing well.  Except fell and hurt tailbone.  Fell in the dark at night and was disoriented to space and tripped. It continues to hurt after a couple of weeks. Asks about reducing clozapine to 3 nightly. No panic attacks since here. Plan: No med changes:  Continue clozapine 400 mg HS  (She prefers all at once at night) Lithium 600 mg HS clonazepam 0.75 mg BID  07/10/2022 appointment noted: seen with H per usual Has notes.  [place] getting into car and asked about iburprofen.  Feb fall and broke tailbone.  Trying to be careful about falling.  Wonders if meds contribute to fall risk.  Asks why need 4 clozapine. Still moves feet at times when not needed. Sun AM sometimes hard to stay awake.  Never falls asleep. Not driven in 11 years. No panic. No paranoia and psychosis.  Oldest daughter married March 2020  On clozapine for many years with benefit.  Several psych hospitalizations.  She is failed multiple other psychiatric medications which is why she is on clozapine.  She is also failed or relapsed with dosage reduction of clozapine or if she missed clozapine.  She is consistent with her dosage now.  Past Psychiatric Medication Trials: Clozapine 400 Clonazepam Lithium 600 Citrucil, Miralax, stool softener, Mg tablets  Sister has schizophrenia.  Review of Systems:  Review of Systems  Cardiovascular:  Negative for palpitations.   Gastrointestinal:  Positive for abdominal distention, constipation and diarrhea. Negative for abdominal pain.  Genitourinary:  Positive for enuresis.  Musculoskeletal:  Positive for back pain, neck pain and neck stiffness.  Neurological:  Negative for tremors.  Psychiatric/Behavioral:  Positive for decreased concentration, dysphoric mood and sleep disturbance. Negative for agitation, behavioral problems, confusion, hallucinations, self-injury and suicidal ideas. The patient is not nervous/anxious and is not hyperactive.   Constipation comes and goes  Medications: I have reviewed the patient's current medications.  Current Outpatient Medications  Medication Sig Dispense Refill   Ascorbic Acid (VITAMIN C) 500 MG CHEW Chew by mouth.     famotidine (PEPCID) 40 MG tablet TAKE 1 TABLET(40 MG) BY MOUTH DAILY 90 tablet 1   Magnesium 400 MG CAPS Take by mouth.     metoprolol tartrate (LOPRESSOR) 25 MG tablet Take 0.5 tablets (12.5 mg total) by mouth 2 (two) times daily. 90 tablet 1   Vitamin D, Ergocalciferol, (DRISDOL) 1.25 MG (50000 UNIT) CAPS capsule  Take 1 capsule (50,000 Units total) by mouth every 7 (seven) days. 12 capsule 1   zinc gluconate 50 MG tablet Take 50 mg by mouth daily.     clonazePAM (KLONOPIN) 0.5 MG tablet TAKE 1 AND 1/2 TABLETS BY MOUTH EVERY MORNING AND EVERY NIGHT AT BEDTIME 90 tablet 5   cloZAPine (CLOZARIL) 100 MG tablet 4 tabs HS 120 tablet 6   lithium 300 MG tablet TAKE 2 TABLETS(600 MG) BY MOUTH AT BEDTIME 180 tablet 1   TRULANCE 3 MG TABS TAKE 1 TABLET BY MOUTH DAILY 90 tablet 3   No current facility-administered medications for this visit.    Medication Side Effects: Other: alternating contstipation and diarrhea but has IBS  Allergies:  Allergies  Allergen Reactions   Levsin [Hyoscyamine Sulfate] Hives   Escitalopram Hives    Past Medical History:  Diagnosis Date   Abnormal perimenopausal bleeding    Bowel incontinence    Chronic constipation     Dermatitis    High risk medication use    Hyperglycemia    Hypertriglyceridemia    Iron deficiency anemia due to chronic blood loss    secondary to heavy flow   Metallic taste    Schizo-affective psychosis (HCC)    Schizoaffective disorder, bipolar type ([place])    Dr. Tyrone Sage    Family History  Problem Relation Age of Onset   Mental illness Sister        Schizophrenia and bipolar   Cancer Maternal Grandmother        Colon   Mental illness Paternal Grandmother        Schizophrenia   Breast cancer Neg Hx     Social History   Socioeconomic History   Marital status: Married    Spouse name: [name]   Number of children: 2   Years of education: Not on file   Highest education level: Bachelor's degree (e.g., BA, AB, BS)  Occupational History   Not on file  Tobacco Use   Smoking status: Never   Smokeless tobacco: Never  Vaping Use   Vaping Use: Never used  Substance and Sexual Activity   Alcohol use: No    Alcohol/week: 0.0 standard drinks of alcohol   Drug use: No   Sexual activity: Not Currently    Partners: Male  Other Topics Concern   Not on file  Social History Narrative   Not on file   Social Determinants of Health   Financial Resource Strain: Low Risk  (02/20/2022)   Overall Financial Resource Strain (CARDIA)    Difficulty of Paying Living Expenses: Not hard at all  Food Insecurity: No Food Insecurity (02/20/2022)   Hunger Vital Sign    Worried About Running Out of Food in the Last Year: Never true    Ran Out of Food in the Last Year: Never true  Transportation Needs: No Transportation Needs (02/20/2022)   PRAPARE - Hydrologist (Medical): No    Lack of Transportation (Non-Medical): No  Physical Activity: Sufficiently Active (02/20/2022)   Exercise Vital Sign    Days of Exercise per Week: 6 days    Minutes of Exercise per Session: 60 min  Stress: Stress Concern Present (02/20/2022)   [place]    Feeling of Stress : To some extent  Social Connections: Socially Integrated (02/20/2022)   Social Connection and Isolation Panel [NHANES]    Frequency of Communication with Friends and Family: Three times a  week    Frequency of Social Gatherings with Friends and Family: Three times a week    Attends Religious Services: More than 4 times per year    Active Member of Clubs or Organizations: Yes    Attends Archivist Meetings: More than 4 times per year    Marital Status: Married  Human resources officer Violence: Not At Risk (02/20/2022)   Humiliation, Afraid, Rape, and Kick questionnaire    Fear of Current or Ex-Partner: No    Emotionally Abused: No    Physically Abused: No    Sexually Abused: No    Past Medical History, Surgical history, Social history, and Family history were reviewed and updated as appropriate.   Please see review of systems for further details on the patient's review from today.   Objective:   Physical Exam:  LMP 12/16/2016 (Approximate)   Physical Exam Constitutional:      General: She is not in acute distress.    Appearance: She is well-developed.  Musculoskeletal:        General: No deformity.  Neurological:     Mental Status: She is alert and oriented to person, place, and time.     Cranial Nerves: No dysarthria.     Coordination: Coordination normal.  Psychiatric:        Attention and Perception: Perception normal. She is inattentive. She does not perceive auditory or visual hallucinations.        Mood and Affect: Mood is anxious. Mood is not depressed. Affect is not labile, blunt, angry, tearful or inappropriate.        Speech: Speech is not rapid and pressured, slurred or tangential.        Behavior: Behavior normal. Behavior is cooperative.        Thought Content: Thought content is not paranoid or delusional. Thought content does not include homicidal or suicidal ideation. Thought content does not  include suicidal plan.        Cognition and Memory: Cognition is impaired. She exhibits impaired recent memory.     Comments: Insight & judgment still fair. Scattered thought chronically but less hyperverbal and overinclusiveness.  Not as fidgety as in the past.  Affect more relaxed. No irritable Chronic stooped posture.       Lab Review:     Component Value Date/Time   NA 137 11/09/2021 1621   NA 140 01/18/2016 1418   NA 139 08/27/2013 0930   K 4.2 11/09/2021 1621   K 4.2 08/27/2013 0930   CL 101 11/09/2021 1621   CL 108 (H) 08/27/2013 0930   CO2 30 11/09/2021 1621   CO2 28 08/27/2013 0930   GLUCOSE 94 11/09/2021 1621   GLUCOSE 98 08/27/2013 0930   BUN 13 11/09/2021 1621   BUN 17 01/18/2016 1418   BUN 11 08/27/2013 0930   CREATININE 0.91 11/09/2021 1621   CALCIUM 9.3 11/09/2021 1621   CALCIUM 8.5 08/27/2013 0930   PROT 6.4 11/09/2021 1621   PROT 6.6 01/18/2016 1418   PROT 7.2 08/27/2013 0930   ALBUMIN 4.3 09/11/2020 2000   ALBUMIN 4.4 01/18/2016 1418   ALBUMIN 3.5 08/27/2013 0930   AST 14 11/09/2021 1621   AST 20 08/27/2013 0930   ALT 9 11/09/2021 1621   ALT 19 08/27/2013 0930   ALKPHOS 95 09/11/2020 2000   ALKPHOS 107 08/27/2013 0930   BILITOT 0.3 11/09/2021 1621   BILITOT <0.2 01/18/2016 1418   BILITOT 0.3 08/27/2013 0930   GFRNONAA >60 09/11/2020 2000   GFRNONAA  63 07/11/2020 1558   GFRAA >60 08/31/2020 1633   GFRAA 73 07/11/2020 1558       Component Value Date/Time   WBC 8.3 06/17/2022 1208   RBC 4.05 06/17/2022 1208   HGB 12.0 06/17/2022 1208   HGB 12.3 01/23/2016 1231   HCT 38.1 06/17/2022 1208   HCT 38.6 01/23/2016 1231   PLT 240 06/17/2022 1208   PLT 280 01/23/2016 1231   MCV 94.1 06/17/2022 1208   MCV 95 01/23/2016 1231   MCV 92 03/24/2015 1244   MCH 29.6 06/17/2022 1208   MCHC 31.5 06/17/2022 1208   RDW 13.2 06/17/2022 1208   RDW 13.7 01/23/2016 1231   RDW 13.1 03/24/2015 1244   LYMPHSABS 2.0 06/17/2022 1208   LYMPHSABS 2.4 01/23/2016  1231   LYMPHSABS 2.2 03/24/2015 1244   MONOABS 0.6 06/17/2022 1208   MONOABS 0.4 03/24/2015 1244   EOSABS 0.2 06/17/2022 1208   EOSABS 0.1 01/23/2016 1231   EOSABS 0.4 03/24/2015 1244   BASOSABS 0.1 06/17/2022 1208   BASOSABS 0.0 01/23/2016 1231   BASOSABS 0.0 03/24/2015 1244    Lithium Lvl  Date Value Ref Range Status  09/12/2020 0.46 (L) 0.60 - 1.20 mmol/L Final    Comment:    Performed at [facility], [address] [place], [place] [address]     Lab Results  Component Value Date   VALPROATE 102 (H) 12/26/2015     .res Assessment: Plan:    Schizoaffective disorder, bipolar type ([place]) - Plan: cloZAPine (CLOZARIL) 100 MG tablet, lithium 300 MG tablet  Panic disorder with agoraphobia - Plan: clonazePAM (KLONOPIN) 0.5 MG tablet  Chronic constipation  Long term current use of clozapine  Lithium use   Overall her schizoaffective disorder was under control.  Until she ran out of clozapine bc of difficulty getting it last summer/fall 2021.  This led to more disorganziation, confusion, anxiety and rumination.  Insight is not good.  Not self aware about overinclusiveness. All of this is better now with better clozapine consistency and exercise.  Discussed the importance of consistent with safety with the clozapine.  If they have difficulty obtaining it in the future please contact us right away.  She missed an off of the clozapine to cause disorganization and psychotic symptoms as well as anxiety.  It would take a while for that to resolve.  Answered questions about clozapine and alternatives.  This has been much more effective and will not cause TD Continue clozapine 400 mg HS  (She prefers all at once at night)  Panic disorder controlled. Consider reduciton bc balance. clonazepam 0.75 mg BID  Need repeat lithium level. Contnue lithium 600 mg HS  Counseled patient regarding potential benefits, risks, and side effects of lithium to include potential risk of  lithium affecting thyroid and renal function.  Discussed need for periodic lab monitoring to determine drug level and to assess for potential adverse effects.  Counseled patient regarding signs and symptoms of lithium toxicity and advised that they notify office immediately or seek urgent medical attention if experiencing these signs and symptoms.  Patient advised to contact office with any questions or concerns. Disc DDI with NSAIDs.  Need lithium level if takes regularly.  Fall precautions around sedative medications. Disc PT for head forward posture which she did some.  Emphasized this as means to help reduce fall risk.  This was emphasized again. Book :  7 Steps to a Pain-Free Life by Delana Meyer  ANC stable . Continue clozapine.  Continue lab test per usual.    Vitamin D in winter.  GI managing constipation better.  Exercise helps constipation.  She is walking 6 days per week. Answered questions about probiotics.  Option modafinil for alertness is safer than alternative of lower dose.  H doesn't want her to take more meds.  No med changes:  Continue clozapine 400 mg HS  (She prefers all at once at night) Lithium 600 mg HS clonazepam 0.75 mg BID  This appt was 30 mins.  FU 6 mos  Lynder Parents, MD, DFAPA   Please see After Visit Summary for patient specific instructions.  Future Appointments  Date Time Provider [place]  08/08/2022  2:20 PM Teodora Medici, DO [facility] [facility]  08/26/2022  9:45 AM Ralene Bathe, MD [facility] None  01/14/2023  4:30 PM Cottle, [place]ley Co., MD [facility] None    No orders of the defined types were placed in this encounter.      -------------------------------

## 2022-07-12 ENCOUNTER — Other Ambulatory Visit
Admission: RE | Admit: 2022-07-12 | Discharge: 2022-07-12 | Disposition: A | Discharge status: Home / Self Care | Payer: 59 | Attending: Psychiatry | Admitting: Psychiatry

## 2022-07-12 DIAGNOSIS — Z79899 Other long term (current) drug therapy: Secondary | ICD-10-CM | POA: Insufficient documentation

## 2022-07-12 DIAGNOSIS — F250 Schizoaffective disorder, bipolar type: Secondary | ICD-10-CM

## 2022-07-12 LAB — CBC WITH DIFFERENTIAL/PLATELET
Abs Immature Granulocytes: 0.01 10*3/uL (ref 0.00–0.07)
Basophils Absolute: 0.1 10*3/uL (ref 0.0–0.1)
Basophils Relative: 1 %
Eosinophils Absolute: 0.3 10*3/uL (ref 0.0–0.5)
Eosinophils Relative: 4 %
HCT: 37.2 % (ref 36.0–46.0)
Hemoglobin: 11.7 g/dL — ABNORMAL LOW (ref 12.0–15.0)
Immature Granulocytes: 0 %
Lymphocytes Relative: 31 %
Lymphs Abs: 2.5 10*3/uL (ref 0.7–4.0)
MCH: 29.6 pg (ref 26.0–34.0)
MCHC: 31.5 g/dL (ref 30.0–36.0)
MCV: 94.2 fL (ref 80.0–100.0)
Monocytes Absolute: 0.5 10*3/uL (ref 0.1–1.0)
Monocytes Relative: 6 %
Neutro Abs: 4.6 10*3/uL (ref 1.7–7.7)
Neutrophils Relative %: 58 %
Platelets: 248 10*3/uL (ref 150–400)
RBC: 3.95 MIL/uL (ref 3.87–5.11)
RDW: 12.9 % (ref 11.5–15.5)
WBC: 8 10*3/uL (ref 4.0–10.5)
nRBC: 0 % (ref 0.0–0.2)

## 2022-07-22 ENCOUNTER — Ambulatory Visit: Payer: 59 | Admitting: Psychiatry

## 2022-08-08 ENCOUNTER — Ambulatory Visit (INDEPENDENT_AMBULATORY_CARE_PROVIDER_SITE_OTHER): Payer: 59 | Admitting: Internal Medicine

## 2022-08-08 ENCOUNTER — Encounter: Payer: Self-pay | Admitting: Internal Medicine

## 2022-08-08 VITALS — BP 110/68 | HR 98 | Resp 16 | Ht 64.0 in | Wt 143.0 lb

## 2022-08-08 DIAGNOSIS — Z1231 Encounter for screening mammogram for malignant neoplasm of breast: Secondary | ICD-10-CM | POA: Diagnosis not present

## 2022-08-08 DIAGNOSIS — Z13820 Encounter for screening for osteoporosis: Secondary | ICD-10-CM | POA: Diagnosis not present

## 2022-08-08 DIAGNOSIS — R000 Tachycardia, unspecified: Secondary | ICD-10-CM

## 2022-08-08 DIAGNOSIS — F250 Schizoaffective disorder, bipolar type: Secondary | ICD-10-CM

## 2022-08-08 DIAGNOSIS — E2839 Other primary ovarian failure: Secondary | ICD-10-CM

## 2022-08-08 DIAGNOSIS — K219 Gastro-esophageal reflux disease without esophagitis: Secondary | ICD-10-CM

## 2022-08-08 NOTE — Patient Instructions (Addendum)
It was great seeing you today!  Plan discussed at today's visit: -Blood work ordered today, results will be uploaded to [facility].  -Please call to schedule mammogram and bone scan after 09/27/22.   Follow up in: 6 months   Take care and let us know if you have any questions or concerns prior to your next visit.  Dr. Rosana Berger

## 2022-08-08 NOTE — Progress Notes (Signed)
Established Patient Office Visit  Subjective   Patient ID: Carla Thompson, female    DOB: 12/23/1960  Age: 61 y.o. MRN: [id number]  Chief Complaint  Patient presents with   Follow-up    HPI Patient is here for follow up on chronic medical conditions.   Tachycardia: -Currently on Metoprolol 12.5 mg BID  HLD/Hypertriglyceridemia: -Medications: Nothing currently  -Patient is compliant with above medications and reports no side effects.  -Last lipid panel: Lipid Panel     Component Value Date/Time   CHOL 235 (H) 11/09/2021 1621   CHOL 241 (H) 08/27/2013 0930   TRIG 240 (H) 11/09/2021 1621   TRIG 143 08/27/2013 0930   HDL 63 11/09/2021 1621   HDL 63 (H) 08/27/2013 0930   CHOLHDL 3.7 11/09/2021 1621   VLDL 40 05/18/2018 1305   VLDL 29 08/27/2013 0930   LDLCALC 133 (H) 11/09/2021 1621   LDLCALC 149 (H) 08/27/2013 0930   The 10-year ASCVD risk score (Arnett DK, et al., 2019) is: 2.3%   Values used to calculate the score:     Age: 70 years     Sex: Female     Is Non-Hispanic African American: No     Diabetic: No     Tobacco smoker: No     Systolic Blood Pressure: 197 mmHg     Is BP treated: No     HDL Cholesterol: 63 mg/dL     Total Cholesterol: 235 mg/dL  GERD: -Currently on Pepcid 40 mg daily, controlling symptoms well   Chronic Constipation:  -Currently on Trulance, following with GI  Schizophrenia: -Following with Psychiatry, Dr. Clovis Pu -Mood status: stable -Current treatment: Lithium 600 mg, Clozapine 100 mg, Klonopin 0.5 mg  -Satisfied with current treatment?: yes -Duration of current treatment : chronic -Side effects: no Medication compliance: excellent compliance  Health Maintenance: -Blood work UTD -Mammogram 10/22 Bir[place]-1 -Colonoscopy 11/30/20 repeat in 10 years     Review of Systems  All other systems reviewed and are negative.     Objective:     BP 110/68   Pulse 98   Resp 16   Ht 5' 4" (1.626 m)   Wt 143 lb (64.9 kg)   LMP  12/16/2016 (Approximate)   SpO2 99%   BMI 24.55 kg/m  BP Readings from Last 3 Encounters:  08/08/22 110/68  05/15/22 116/74  02/20/22 120/72   Wt Readings from Last 3 Encounters:  08/08/22 143 lb (64.9 kg)  05/15/22 144 lb (65.3 kg)  02/20/22 144 lb (65.3 kg)      Physical Exam Constitutional:      Appearance: Normal appearance.  HENT:     Head: Normocephalic and atraumatic.     Mouth/Throat:     Mouth: Mucous membranes are moist.     Pharynx: Oropharynx is clear.  Eyes:     Conjunctiva/sclera: Conjunctivae normal.  Cardiovascular:     Rate and Rhythm: Normal rate and regular rhythm.  Pulmonary:     Effort: Pulmonary effort is normal.     Breath sounds: Normal breath sounds.  Musculoskeletal:     Right lower leg: No edema.     Left lower leg: No edema.  Skin:    General: Skin is warm and dry.  Neurological:     General: No focal deficit present.     Mental Status: She is alert. Mental status is at baseline.  Psychiatric:        Mood and Affect: Mood normal.  Behavior: Behavior normal.    No results found for any visits on 08/08/22.  Last CBC Lab Results  Component Value Date   WBC 8.0 07/12/2022   HGB 11.7 (L) 07/12/2022   HCT 37.2 07/12/2022   MCV 94.2 07/12/2022   MCH 29.6 07/12/2022   RDW 12.9 07/12/2022   PLT 248 07/12/2022   Last metabolic panel Lab Results  Component Value Date   GLUCOSE 94 11/09/2021   NA 137 11/09/2021   K 4.2 11/09/2021   CL 101 11/09/2021   CO2 30 11/09/2021   BUN 13 11/09/2021   CREATININE 0.91 11/09/2021   EGFR 73 11/09/2021   CALCIUM 9.3 11/09/2021   PROT 6.4 11/09/2021   ALBUMIN 4.3 09/11/2020   LABGLOB 2.2 01/18/2016   AGRATIO 2.0 01/18/2016   BILITOT 0.3 11/09/2021   ALKPHOS 95 09/11/2020   AST 14 11/09/2021   ALT 9 11/09/2021   ANIONGAP 9 09/11/2020   Last lipids Lab Results  Component Value Date   CHOL 235 (H) 11/09/2021   HDL 63 11/09/2021   LDLCALC 133 (H) 11/09/2021   TRIG 240 (H)  11/09/2021   CHOLHDL 3.7 11/09/2021   Last hemoglobin A1c Lab Results  Component Value Date   HGBA1C 5.0 02/20/2022   Last thyroid functions Lab Results  Component Value Date   TSH 1.700 11/15/2020   Last vitamin D Lab Results  Component Value Date   VD25OH 60 02/20/2022   Last vitamin B12 and Folate Lab Results  Component Value Date   VITAMINB12 1,069 (H) 01/18/2016      The 10-year ASCVD risk score (Arnett DK, et al., 2019) is: 2.3%    Assessment & Plan:   1. Osteoporosis screening/Estrogen deficiency: Bone scan due.   - DG Bone Density; Future  2. Encounter for screening mammogram for malignant neoplasm of breast: Mammogram will be due the end of October, ordered today.   - MM Digital Screening; Future  3. Tachycardia: Stable, continue Metoprolol 12.5 mg BID.  4. GERD without esophagitis: Stable, continue Pepcid 40 mg.   5. Schizoaffective disorder, bipolar type (HCC): Following with Psychiatry, note from 07/10/22 reviewed, medications reviewed.   Return in about 6 months (around 02/06/2023).     , DO  

## 2022-08-12 ENCOUNTER — Other Ambulatory Visit
Admission: RE | Admit: 2022-08-12 | Discharge: 2022-08-12 | Disposition: A | Discharge status: Home / Self Care | Payer: 59 | Attending: Psychiatry | Admitting: Psychiatry

## 2022-08-12 DIAGNOSIS — Z79899 Other long term (current) drug therapy: Secondary | ICD-10-CM | POA: Insufficient documentation

## 2022-08-12 DIAGNOSIS — F250 Schizoaffective disorder, bipolar type: Secondary | ICD-10-CM | POA: Insufficient documentation

## 2022-08-12 LAB — CBC WITH DIFFERENTIAL/PLATELET
Abs Immature Granulocytes: 0.02 10*3/uL (ref 0.00–0.07)
Basophils Absolute: 0.1 10*3/uL (ref 0.0–0.1)
Basophils Relative: 1 %
Eosinophils Absolute: 0.2 10*3/uL (ref 0.0–0.5)
Eosinophils Relative: 2 %
HCT: 38 % (ref 36.0–46.0)
Hemoglobin: 12 g/dL (ref 12.0–15.0)
Immature Granulocytes: 0 %
Lymphocytes Relative: 18 %
Lymphs Abs: 1.6 10*3/uL (ref 0.7–4.0)
MCH: 29.7 pg (ref 26.0–34.0)
MCHC: 31.6 g/dL (ref 30.0–36.0)
MCV: 94.1 fL (ref 80.0–100.0)
Monocytes Absolute: 0.5 10*3/uL (ref 0.1–1.0)
Monocytes Relative: 6 %
Neutro Abs: 6.3 10*3/uL (ref 1.7–7.7)
Neutrophils Relative %: 73 %
Platelets: 252 10*3/uL (ref 150–400)
RBC: 4.04 MIL/uL (ref 3.87–5.11)
RDW: 12.3 % (ref 11.5–15.5)
WBC: 8.7 10*3/uL (ref 4.0–10.5)
nRBC: 0 % (ref 0.0–0.2)

## 2022-08-15 ENCOUNTER — Telehealth: Payer: Self-pay

## 2022-08-15 ENCOUNTER — Other Ambulatory Visit: Payer: Self-pay | Admitting: Psychiatry

## 2022-08-15 DIAGNOSIS — F4001 Agoraphobia with panic disorder: Secondary | ICD-10-CM

## 2022-08-15 MED ORDER — CLONAZEPAM 1 MG PO TABS: 0.5000 mg | ORAL_TABLET | Freq: Three times a day (TID) | ORAL | 1 refills | Status: DC

## 2022-08-15 NOTE — Telephone Encounter (Signed)
Sent as clonazepam 1 mg tablet 1/2 tablet 3 times daily

## 2022-08-15 NOTE — Telephone Encounter (Signed)
[facility] reports their Clonazepam 0.5 mg is on back order. They are requesting an alternative strength. Please advise. She takes Clonazepam 0.5 mg takes 1.5 tablets twice daily.

## 2022-08-23 ENCOUNTER — Other Ambulatory Visit: Payer: Self-pay | Admitting: Family Medicine

## 2022-08-23 DIAGNOSIS — E559 Vitamin D deficiency, unspecified: Secondary | ICD-10-CM

## 2022-08-26 ENCOUNTER — Ambulatory Visit (INDEPENDENT_AMBULATORY_CARE_PROVIDER_SITE_OTHER): Payer: 59 | Admitting: Dermatology

## 2022-08-26 DIAGNOSIS — D239 Other benign neoplasm of skin, unspecified: Secondary | ICD-10-CM

## 2022-08-26 DIAGNOSIS — L578 Other skin changes due to chronic exposure to nonionizing radiation: Secondary | ICD-10-CM

## 2022-08-26 DIAGNOSIS — D2222 Melanocytic nevi of left ear and external auricular canal: Secondary | ICD-10-CM | POA: Diagnosis not present

## 2022-08-26 DIAGNOSIS — D489 Neoplasm of uncertain behavior, unspecified: Secondary | ICD-10-CM

## 2022-08-26 DIAGNOSIS — L821 Other seborrheic keratosis: Secondary | ICD-10-CM | POA: Diagnosis not present

## 2022-08-26 HISTORY — DX: Other benign neoplasm of skin, unspecified: D23.9

## 2022-08-26 NOTE — Progress Notes (Unsigned)
   New Patient Visit  Subjective  Carla Thompson is a 61 y.o. female who presents for the following: New Patient (Initial Visit) (Small mole on left ear that needs to be checked. Patient also reports a spot at right breast that would like checked, Patient denies family or personal history of skin cancer. ). The patient has spots, moles and lesions to be evaluated, some may be new or changing and the patient has concerns that these could be cancer.  The following portions of the chart were reviewed this encounter and updated as appropriate:   Tobacco  Allergies  Meds  Problems  Med Hx  Surg Hx  Fam Hx     Review of Systems:  No other skin or systemic complaints except as noted in HPI or Assessment and Plan.  Objective  Well appearing patient in no apparent distress; mood and affect are within normal limits.  A focused examination was performed including left ear, right breast. Relevant physical exam findings are noted in the Assessment and Plan.  left pinna of ear 0.4 cm irregular dark brown macule         Assessment & Plan  Neoplasm of uncertain behavior left pinna of ear  Epidermal / dermal shaving  Lesion diameter (cm):  0.4 Informed consent: discussed and consent obtained   Timeout: patient name, date of birth, surgical site, and procedure verified   Procedure prep:  Patient was prepped and draped in usual sterile fashion Prep type:  Isopropyl alcohol Anesthesia: the lesion was anesthetized in a standard fashion   Anesthetic:  1% lidocaine w/ epinephrine 1-100,000 buffered w/ 8.4% NaHCO3 Instrument used: flexible razor blade   Hemostasis achieved with: pressure, aluminum chloride and electrodesiccation   Outcome: patient tolerated procedure well   Post-procedure details: sterile dressing applied and wound care instructions given   Dressing type: bandage and petrolatum    Specimen 1 - Surgical pathology Differential Diagnosis: Nevus vs dysplastic vs sk  Check  Margins: No  Nevus vs dysplastic vs sk  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 - Observe - Call for any changes  Actinic Damage - chronic, secondary to cumulative UV radiation exposure/sun exposure over time - diffuse scaly erythematous macules with underlying dyspigmentation - Recommend daily broad spectrum sunscreen SPF 30+ to sun-exposed areas, reapply every 2 hours as needed.  - Recommend staying in the shade or wearing long sleeves, sun glasses (UVA+UVB protection) and wide brim hats (4-inch brim around the entire circumference of the hat). - Call for new or changing lesions.  Return if symptoms worsen or fail to improve.  IRuthell Rummage, CMA, am acting as scribe for Sarina Ser, MD. Documentation: I have reviewed the above documentation for accuracy and completeness, and I agree with the above.  Sarina Ser, MD

## 2022-08-26 NOTE — Patient Instructions (Addendum)
Biopsy Wound Care Instructions  Leave the original bandage on for 24 hours if possible.  If the bandage becomes soaked or soiled before that time, it is OK to remove it and examine the wound.  A small amount of post-operative bleeding is normal.  If excessive bleeding occurs, remove the bandage, place gauze over the site and apply continuous pressure (no peeking) over the area for 30 minutes. If this does not work, please call our clinic as soon as possible or page your doctor if it is after hours.   Once a day, cleanse the wound with soap and water. It is fine to shower. If a thick crust develops you may use a Q-tip dipped into dilute hydrogen peroxide (mix 1:1 with water) to dissolve it.  Hydrogen peroxide can slow the healing process, so use it only as needed.    After washing, apply petroleum jelly (Vaseline) or an antibiotic ointment if your doctor prescribed one for you, followed by a bandage.    For best healing, the wound should be covered with a layer of ointment at all times. If you are not able to keep the area covered with a bandage to hold the ointment in place, this may mean re-applying the ointment several times a day.  Continue this wound care until the wound has healed and is no longer open.   Itching and mild discomfort is normal during the healing process. However, if you develop pain or severe itching, please call our office.   If you have any discomfort, you can take Tylenol (acetaminophen) or ibuprofen as directed on the bottle. (Please do not take these if you have an allergy to them or cannot take them for another reason).  Some redness, tenderness and white or yellow material in the wound is normal healing.  If the area becomes very sore and red, or develops a thick yellow-green material (pus), it may be infected; please notify us.    If you have stitches, return to clinic as directed to have the stitches removed. You will continue wound care for 2-3 days after the stitches  are removed.   Wound healing continues for up to one year following surgery. It is not unusual to experience pain in the scar from time to time during the interval.  If the pain becomes severe or the scar thickens, you should notify the office.    A slight amount of redness in a scar is expected for the first six months.  After six months, the redness will fade and the scar will soften and fade.  The color difference becomes less noticeable with time.  If there are any problems, return for a post-op surgery check at your earliest convenience.  To improve the appearance of the scar, you can use silicone scar gel, cream, or sheets (such as Mederma or Serica) every night for up to one year. These are available over the counter (without a prescription).  Please call our office at [phone] for any questions or concerns.      Seborrheic Keratosis  What causes seborrheic keratoses? Seborrheic keratoses are harmless, common skin growths that first appear during adult life.  As time goes by, more growths appear.  Some people may develop a large number of them.  Seborrheic keratoses appear on both covered and uncovered body parts.  They are not caused by sunlight.  The tendency to develop seborrheic keratoses can be inherited.  They vary in color from skin-colored to gray, brown, or even black.    They can be either smooth or have a rough, warty surface.   Seborrheic keratoses are superficial and look as if they were stuck on the skin.  Under the microscope this type of keratosis looks like layers upon layers of skin.  That is why at times the top layer may seem to fall off, but the rest of the growth remains and re-grows.    Treatment Seborrheic keratoses do not need to be treated, but can easily be removed in the office.  Seborrheic keratoses often cause symptoms when they rub on clothing or jewelry.  Lesions can be in the way of shaving.  If they become inflamed, they can cause itching, soreness,  or burning.  Removal of a seborrheic keratosis can be accomplished by freezing, burning, or surgery. If any spot bleeds, scabs, or grows rapidly, please return to have it checked, as these can be an indication of a skin cancer.    Melanoma ABCDEs  Melanoma is the most dangerous type of skin cancer, and is the leading cause of death from skin disease.  You are more likely to develop melanoma if you: Have light-colored skin, light-colored eyes, or red or blond hair Spend a lot of time in the sun Tan regularly, either outdoors or in a tanning bed Have had blistering sunburns, especially during childhood Have a close family member who has had a melanoma Have atypical moles or large birthmarks  Early detection of melanoma is key since treatment is typically straightforward and cure rates are extremely high if we catch it early.   The first sign of melanoma is often a change in a mole or a new dark spot.  The ABCDE system is a way of remembering the signs of melanoma.  A for asymmetry:  The two halves do not match. B for border:  The edges of the growth are irregular. C for color:  A mixture of colors are present instead of an even brown color. D for diameter:  Melanomas are usually (but not always) greater than 6mm - the size of a pencil eraser. E for evolution:  The spot keeps changing in size, shape, and color.  Please check your skin once per month between visits. You can use a small mirror in front and a large mirror behind you to keep an eye on the back side or your body.   If you see any new or changing lesions before your next follow-up, please call to schedule a visit.  Please continue daily skin protection including broad spectrum sunscreen SPF 30+ to sun-exposed areas, reapplying every 2 hours as needed when you're outdoors.   Staying in the shade or wearing long sleeves, sun glasses (UVA+UVB protection) and wide brim hats (4-inch brim around the entire circumference of the hat)  are also recommended for sun protection.    Due to recent changes in healthcare laws, you may see results of your pathology and/or laboratory studies on MyChart before the doctors have had a chance to review them. We understand that in some cases there may be results that are confusing or concerning to you. Please understand that not all results are received at the same time and often the doctors may need to interpret multiple results in order to provide you with the best plan of care or course of treatment. Therefore, we ask that you please give us 2 business days to thoroughly review all your results before contacting the office for clarification. Should we see a critical lab result, you will   be contacted sooner.   If You Need Anything After Your Visit  If you have any questions or concerns for your doctor, please call our main line at [phone] and press option 4 to reach your doctor's medical assistant. If no one answers, please leave a voicemail as directed and we will return your call as soon as possible. Messages left after 4 pm will be answered the following business day.   You may also send us a message via MyChart. We typically respond to MyChart messages within 1-2 business days.  For prescription refills, please ask your pharmacy to contact our office. Our fax number is [phone].  If you have an urgent issue when the clinic is closed that cannot wait until the next business day, you can page your doctor at the number below.    Please note that while we do our best to be available for urgent issues outside of office hours, we are not available 24/7.   If you have an urgent issue and are unable to reach us, you may choose to seek medical care at your doctor's office, retail clinic, urgent care center, or emergency room.  If you have a medical emergency, please immediately call 911 or go to the emergency department.  Pager Numbers  - Dr. Kowalski: [phone]  - Dr. Moye:  [phone]  - Dr. Stewart: [phone]  In the event of inclement weather, please call our main line at [phone] for an update on the status of any delays or closures.  Dermatology Medication Tips: Please keep the boxes that topical medications come in in order to help keep track of the instructions about where and how to use these. Pharmacies typically print the medication instructions only on the boxes and not directly on the medication tubes.   If your medication is too expensive, please contact our office at [phone] option 4 or send us a message through MyChart.   We are unable to tell what your co-pay for medications will be in advance as this is different depending on your insurance coverage. However, we may be able to find a substitute medication at lower cost or fill out paperwork to get insurance to cover a needed medication.   If a prior authorization is required to get your medication covered by your insurance company, please allow us 1-2 business days to complete this process.  Drug prices often vary depending on where the prescription is filled and some pharmacies may offer cheaper prices.  The website www.goodrx.com contains coupons for medications through different pharmacies. The prices here do not account for what the cost may be with help from insurance (it may be cheaper with your insurance), but the website can give you the price if you did not use any insurance.  - You can print the associated coupon and take it with your prescription to the pharmacy.  - You may also stop by our office during regular business hours and pick up a GoodRx coupon card.  - If you need your prescription sent electronically to a different pharmacy, notify our office through [facility] MyChart or by phone at [phone] option 4.     Si Usted Necesita Algo Despus de Su Visita  Tambin puede enviarnos un mensaje a travs de MyChart. Por lo general respondemos a los mensajes de  MyChart en el transcurso de 1 a 2 das hbiles.  Para renovar recetas, por favor pida a su farmacia que se ponga en contacto con nuestra oficina. Nuestro nmero de fax   es el [phone].  Si tiene un asunto urgente cuando la clnica est cerrada y que no puede esperar hasta el siguiente da hbil, puede llamar/localizar a su doctor(a) al nmero que aparece a continuacin.   Por favor, tenga en cuenta que aunque hacemos todo lo posible para estar disponibles para asuntos urgentes fuera del horario de oficina, no estamos disponibles las 24 horas del da, los 7 das de la semana.   Si tiene un problema urgente y no puede comunicarse con nosotros, puede optar por buscar atencin mdica  en el consultorio de su doctor(a), en una clnica privada, en un centro de atencin urgente o en una sala de emergencias.  Si tiene una emergencia mdica, por favor llame inmediatamente al 911 o vaya a la sala de emergencias.  Nmeros de bper  - Dr. Kowalski: [phone]  - Dra. Moye: [phone]  - Dra. Stewart: [phone]  En caso de inclemencias del tiempo, por favor llame a nuestra lnea principal al [phone] para una actualizacin sobre el estado de cualquier retraso o cierre.  Consejos para la medicacin en dermatologa: Por favor, guarde las cajas en las que vienen los medicamentos de uso tpico para ayudarle a seguir las instrucciones sobre dnde y cmo usarlos. Las farmacias generalmente imprimen las instrucciones del medicamento slo en las cajas y no directamente en los tubos del medicamento.   Si su medicamento es muy caro, por favor, pngase en contacto con nuestra oficina llamando al [phone] y presione la opcin 4 o envenos un mensaje a travs de MyChart.   No podemos decirle cul ser su copago por los medicamentos por adelantado ya que esto es diferente dependiendo de la cobertura de su seguro. Sin embargo, es posible que podamos encontrar un medicamento sustituto a menor costo o  llenar un formulario para que el seguro cubra el medicamento que se considera necesario.   Si se requiere una autorizacin previa para que su compaa de seguros cubra su medicamento, por favor permtanos de 1 a 2 das hbiles para completar este proceso.  Los precios de los medicamentos varan con frecuencia dependiendo del lugar de dnde se surte la receta y alguna farmacias pueden ofrecer precios ms baratos.  El sitio web www.goodrx.com tiene cupones para medicamentos de diferentes farmacias. Los precios aqu no tienen en cuenta lo que podra costar con la ayuda del seguro (puede ser ms barato con su seguro), pero el sitio web puede darle el precio si no utiliz ningn seguro.  - Puede imprimir el cupn correspondiente y llevarlo con su receta a la farmacia.  - Tambin puede pasar por nuestra oficina durante el horario de atencin regular y recoger una tarjeta de cupones de GoodRx.  - Si necesita que su receta se enve electrnicamente a una farmacia diferente, informe a nuestra oficina a travs de MyChart de [facility] o por telfono llamando al [phone] y presione la opcin 4.  

## 2022-08-28 ENCOUNTER — Encounter: Payer: Self-pay | Admitting: Dermatology

## 2022-08-28 ENCOUNTER — Telehealth: Payer: Self-pay

## 2022-08-28 NOTE — Telephone Encounter (Signed)
Left pt message to call for bx result/sh 

## 2022-08-28 NOTE — Telephone Encounter (Signed)
-----   Message from Ralene Bathe, MD sent at 08/27/2022  7:53 PM EDT ----- Diagnosis Skin , left pinna of ear DYSPLASTIC NEVUS WITH MODERATE TO SEVERE ATYPIA, DEEP MARGIN INVOLVED, SEE DESCRIPTION  Severe dysplastic Schedule surgery

## 2022-08-29 ENCOUNTER — Telehealth: Payer: Self-pay

## 2022-08-29 NOTE — Telephone Encounter (Signed)
-----   Message from Ralene Bathe, MD sent at 08/27/2022  7:53 PM EDT ----- Diagnosis Skin , left pinna of ear DYSPLASTIC NEVUS WITH MODERATE TO SEVERE ATYPIA, DEEP MARGIN INVOLVED, SEE DESCRIPTION  Severe dysplastic Schedule surgery

## 2022-08-29 NOTE — Telephone Encounter (Signed)
Patient notified of biopsy results and surgery scheduled.

## 2022-09-03 ENCOUNTER — Other Ambulatory Visit: Payer: Self-pay | Admitting: Gastroenterology

## 2022-09-03 ENCOUNTER — Other Ambulatory Visit: Payer: Self-pay | Admitting: Family Medicine

## 2022-09-03 DIAGNOSIS — K219 Gastro-esophageal reflux disease without esophagitis: Secondary | ICD-10-CM

## 2022-09-10 ENCOUNTER — Other Ambulatory Visit
Admission: RE | Admit: 2022-09-10 | Discharge: 2022-09-10 | Disposition: A | Discharge status: Home / Self Care | Payer: 59 | Attending: Psychiatry | Admitting: Psychiatry

## 2022-09-10 DIAGNOSIS — Z79899 Other long term (current) drug therapy: Secondary | ICD-10-CM | POA: Insufficient documentation

## 2022-09-10 DIAGNOSIS — F250 Schizoaffective disorder, bipolar type: Secondary | ICD-10-CM | POA: Insufficient documentation

## 2022-09-10 LAB — CBC WITH DIFFERENTIAL/PLATELET
Abs Immature Granulocytes: 0.03 10*3/uL (ref 0.00–0.07)
Basophils Absolute: 0.1 10*3/uL (ref 0.0–0.1)
Basophils Relative: 1 %
Eosinophils Absolute: 0.4 10*3/uL (ref 0.0–0.5)
Eosinophils Relative: 5 %
HCT: 40.6 % (ref 36.0–46.0)
Hemoglobin: 12.5 g/dL (ref 12.0–15.0)
Immature Granulocytes: 0 %
Lymphocytes Relative: 32 %
Lymphs Abs: 2.8 10*3/uL (ref 0.7–4.0)
MCH: 29.6 pg (ref 26.0–34.0)
MCHC: 30.8 g/dL (ref 30.0–36.0)
MCV: 96 fL (ref 80.0–100.0)
Monocytes Absolute: 0.6 10*3/uL (ref 0.1–1.0)
Monocytes Relative: 7 %
Neutro Abs: 4.9 10*3/uL (ref 1.7–7.7)
Neutrophils Relative %: 55 %
Platelets: 271 10*3/uL (ref 150–400)
RBC: 4.23 MIL/uL (ref 3.87–5.11)
RDW: 12.8 % (ref 11.5–15.5)
WBC: 8.8 10*3/uL (ref 4.0–10.5)
nRBC: 0 % (ref 0.0–0.2)

## 2022-09-24 ENCOUNTER — Encounter: Payer: Self-pay | Admitting: Dermatology

## 2022-09-24 ENCOUNTER — Ambulatory Visit (INDEPENDENT_AMBULATORY_CARE_PROVIDER_SITE_OTHER): Payer: 59 | Admitting: Dermatology

## 2022-09-24 DIAGNOSIS — D485 Neoplasm of uncertain behavior of skin: Secondary | ICD-10-CM

## 2022-09-24 MED ORDER — MUPIROCIN 2 % EX OINT: 1.0000 | TOPICAL_OINTMENT | Freq: Every day | CUTANEOUS | 0 refills | Status: DC

## 2022-09-24 NOTE — Patient Instructions (Addendum)

## 2022-09-24 NOTE — Progress Notes (Signed)
   Follow-Up Visit   Subjective  Carla Thompson is a 61 y.o. 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  The following portions of the chart were reviewed this encounter and updated as appropriate:   Tobacco  Allergies  Meds  Problems  Med Hx  Surg Hx  Fam Hx     Review of Systems:  No other skin or systemic complaints except as noted in HPI or Assessment and Plan.  Objective  Well appearing patient in no apparent distress; mood and affect are within normal limits.  A focused examination was performed including left ear. Relevant physical exam findings are noted in the Assessment and Plan.  Left ear pinna Healing biopsy site   Assessment & Plan  Neoplasm of uncertain behavior of skin Left ear pinna  Skin excision  Lesion length (cm):  0.7 Lesion width (cm):  0.7 Margin per side (cm):  0.2 Total excision diameter (cm):  1.1 Informed consent: discussed and consent obtained   Timeout: patient name, date of birth, surgical site, and procedure verified   Procedure prep:  Patient was prepped and draped in usual sterile fashion Prep type:  Isopropyl alcohol and povidone-iodine Anesthesia: the lesion was anesthetized in a standard fashion   Anesthetic:  1% lidocaine w/ epinephrine 1-100,000 buffered w/ 8.4% NaHCO3 Instrument used: #15 blade   Hemostasis achieved with: pressure   Hemostasis achieved with comment:  Electrocautery Outcome: patient tolerated procedure well with no complications   Post-procedure details: sterile dressing applied and wound care instructions given   Dressing type: bandage, pressure dressing and bacitracin (mupirocin)   Additional details:  Left to heal by secondary intention  mupirocin ointment (BACTROBAN) 2 % Apply 1 Application topically daily. With dressing changes  Specimen 1 - Surgical pathology Differential Diagnosis: Bx proven Dysplastic Nevus with moderate to  severe atypia  Check Margins: yes Healing biopsy site [id number]   Return in about 1 week (around 10/01/2022) for suture removal.  I, Ashok Cordia, CMA, am acting as scribe for Sarina Ser, MD . Documentation: I have reviewed the above documentation for accuracy and completeness, and I agree with the above.  Sarina Ser, MD

## 2022-09-30 ENCOUNTER — Encounter: Payer: Self-pay | Admitting: Dermatology

## 2022-10-01 ENCOUNTER — Ambulatory Visit (INDEPENDENT_AMBULATORY_CARE_PROVIDER_SITE_OTHER): Payer: 59 | Admitting: Dermatology

## 2022-10-01 ENCOUNTER — Telehealth: Payer: Self-pay

## 2022-10-01 DIAGNOSIS — Z86018 Personal history of other benign neoplasm: Secondary | ICD-10-CM

## 2022-10-01 NOTE — Patient Instructions (Signed)
Due to recent changes in healthcare laws, you may see results of your pathology and/or laboratory studies on MyChart before the doctors have had a chance to review them. We understand that in some cases there may be results that are confusing or concerning to you. Please understand that not all results are received at the same time and often the doctors may need to interpret multiple results in order to provide you with the best plan of care or course of treatment. Therefore, we ask that you please give us 2 business days to thoroughly review all your results before contacting the office for clarification. Should we see a critical lab result, you will be contacted sooner.   If You Need Anything After Your Visit  If you have any questions or concerns for your doctor, please call our main line at [phone] and press option 4 to reach your doctor's medical assistant. If no one answers, please leave a voicemail as directed and we will return your call as soon as possible. Messages left after 4 pm will be answered the following business day.   You may also send us a message via MyChart. We typically respond to MyChart messages within 1-2 business days.  For prescription refills, please ask your pharmacy to contact our office. Our fax number is [phone].  If you have an urgent issue when the clinic is closed that cannot wait until the next business day, you can page your doctor at the number below.    Please note that while we do our best to be available for urgent issues outside of office hours, we are not available 24/7.   If you have an urgent issue and are unable to reach us, you may choose to seek medical care at your doctor's office, retail clinic, urgent care center, or emergency room.  If you have a medical emergency, please immediately call 911 or go to the emergency department.  Pager Numbers  - Dr. Kowalski: [phone]  - Dr. Moye: [phone]  - Dr. Stewart:  [phone]  In the event of inclement weather, please call our main line at [phone] for an update on the status of any delays or closures.  Dermatology Medication Tips: Please keep the boxes that topical medications come in in order to help keep track of the instructions about where and how to use these. Pharmacies typically print the medication instructions only on the boxes and not directly on the medication tubes.   If your medication is too expensive, please contact our office at [phone] option 4 or send us a message through MyChart.   We are unable to tell what your co-pay for medications will be in advance as this is different depending on your insurance coverage. However, we may be able to find a substitute medication at lower cost or fill out paperwork to get insurance to cover a needed medication.   If a prior authorization is required to get your medication covered by your insurance company, please allow us 1-2 business days to complete this process.  Drug prices often vary depending on where the prescription is filled and some pharmacies may offer cheaper prices.  The website www.goodrx.com contains coupons for medications through different pharmacies. The prices here do not account for what the cost may be with help from insurance (it may be cheaper with your insurance), but the website can give you the price if you did not use any insurance.  - You can print the associated coupon and take it with   your prescription to the pharmacy.  - You may also stop by our office during regular business hours and pick up a GoodRx coupon card.  - If you need your prescription sent electronically to a different pharmacy, notify our office through [facility] MyChart or by phone at [phone] option 4.     Si Usted Necesita Algo Despus de Su Visita  Tambin puede enviarnos un mensaje a travs de MyChart. Por lo general respondemos a los mensajes de MyChart en el transcurso de 1 a 2  das hbiles.  Para renovar recetas, por favor pida a su farmacia que se ponga en contacto con nuestra oficina. Nuestro nmero de fax es el [phone].  Si tiene un asunto urgente cuando la clnica est cerrada y que no puede esperar hasta el siguiente da hbil, puede llamar/localizar a su doctor(a) al nmero que aparece a continuacin.   Por favor, tenga en cuenta que aunque hacemos todo lo posible para estar disponibles para asuntos urgentes fuera del horario de oficina, no estamos disponibles las 24 horas del da, los 7 das de la semana.   Si tiene un problema urgente y no puede comunicarse con nosotros, puede optar por buscar atencin mdica  en el consultorio de su doctor(a), en una clnica privada, en un centro de atencin urgente o en una sala de emergencias.  Si tiene una emergencia mdica, por favor llame inmediatamente al 911 o vaya a la sala de emergencias.  Nmeros de bper  - Dr. Kowalski: [phone]  - Dra. Moye: [phone]  - Dra. Stewart: [phone]  En caso de inclemencias del tiempo, por favor llame a nuestra lnea principal al [phone] para una actualizacin sobre el estado de cualquier retraso o cierre.  Consejos para la medicacin en dermatologa: Por favor, guarde las cajas en las que vienen los medicamentos de uso tpico para ayudarle a seguir las instrucciones sobre dnde y cmo usarlos. Las farmacias generalmente imprimen las instrucciones del medicamento slo en las cajas y no directamente en los tubos del medicamento.   Si su medicamento es muy caro, por favor, pngase en contacto con nuestra oficina llamando al [phone] y presione la opcin 4 o envenos un mensaje a travs de MyChart.   No podemos decirle cul ser su copago por los medicamentos por adelantado ya que esto es diferente dependiendo de la cobertura de su seguro. Sin embargo, es posible que podamos encontrar un medicamento sustituto a menor costo o llenar un formulario para que el  seguro cubra el medicamento que se considera necesario.   Si se requiere una autorizacin previa para que su compaa de seguros cubra su medicamento, por favor permtanos de 1 a 2 das hbiles para completar este proceso.  Los precios de los medicamentos varan con frecuencia dependiendo del lugar de dnde se surte la receta y alguna farmacias pueden ofrecer precios ms baratos.  El sitio web www.goodrx.com tiene cupones para medicamentos de diferentes farmacias. Los precios aqu no tienen en cuenta lo que podra costar con la ayuda del seguro (puede ser ms barato con su seguro), pero el sitio web puede darle el precio si no utiliz ningn seguro.  - Puede imprimir el cupn correspondiente y llevarlo con su receta a la farmacia.  - Tambin puede pasar por nuestra oficina durante el horario de atencin regular y recoger una tarjeta de cupones de GoodRx.  - Si necesita que su receta se enve electrnicamente a una farmacia diferente, informe a nuestra oficina a travs de MyChart de [place]   o por telfono llamando al [phone] y presione la opcin 4.  

## 2022-10-01 NOTE — Telephone Encounter (Signed)
LM on VM please return my call to discuss biopsy results  

## 2022-10-01 NOTE — Telephone Encounter (Signed)
-----   Message from Ralene Bathe, MD sent at 09/29/2022  3:44 PM EDT ----- Diagnosis Skin (M), left ear pinna NO RESIDUAL DYSPLASTIC NEVUS, MARGINS FREE  Site of Severe dysplastic nevus Margins clear

## 2022-10-01 NOTE — Progress Notes (Unsigned)
   Follow-Up Visit   Subjective  Carla Thompson is a 61 y.o. female who presents for the following: Follow-up (Patient here today for follow up. 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 Patient currently using mupirocin daily and covering with bandage.   The following portions of the chart were reviewed this encounter and updated as appropriate:   Tobacco  Allergies  Meds  Problems  Med Hx  Surg Hx  Fam Hx     Review of Systems:  No other skin or systemic complaints except as noted in HPI or Assessment and Plan.  Objective  Well appearing patient in no apparent distress; mood and affect are within normal limits.  A focused examination was performed including face, ears. Relevant physical exam findings are noted in the Assessment and Plan.  Left Ear Pinna Healing excision site   Assessment & Plan  History of dysplastic nevus Left Ear Pinna  Patient advised pathology report shows margins clear..   Wound cleansed with Puracyn, mupirocin applied and covered with band aid.    Return in about 3 months (around 01/01/2023) for TBSE, Hx Dysplastic Nevi.  Graciella Belton, RMA, am acting as scribe for Sarina Ser, MD . Documentation: I have reviewed the above documentation for accuracy and completeness, and I agree with the above.  Sarina Ser, MD

## 2022-10-02 ENCOUNTER — Encounter: Payer: Self-pay | Admitting: Dermatology

## 2022-10-07 ENCOUNTER — Other Ambulatory Visit
Admission: RE | Admit: 2022-10-07 | Discharge: 2022-10-07 | Disposition: A | Discharge status: Home / Self Care | Payer: 59 | Attending: Psychiatry | Admitting: Psychiatry

## 2022-10-07 DIAGNOSIS — Z79899 Other long term (current) drug therapy: Secondary | ICD-10-CM | POA: Diagnosis present

## 2022-10-07 DIAGNOSIS — F250 Schizoaffective disorder, bipolar type: Secondary | ICD-10-CM | POA: Diagnosis present

## 2022-10-07 LAB — CBC WITH DIFFERENTIAL/PLATELET
Abs Immature Granulocytes: 0.04 10*3/uL (ref 0.00–0.07)
Basophils Absolute: 0.1 10*3/uL (ref 0.0–0.1)
Basophils Relative: 1 %
Eosinophils Absolute: 0.3 10*3/uL (ref 0.0–0.5)
Eosinophils Relative: 3 %
HCT: 39.6 % (ref 36.0–46.0)
Hemoglobin: 12.4 g/dL (ref 12.0–15.0)
Immature Granulocytes: 0 %
Lymphocytes Relative: 21 %
Lymphs Abs: 2.2 10*3/uL (ref 0.7–4.0)
MCH: 29.5 pg (ref 26.0–34.0)
MCHC: 31.3 g/dL (ref 30.0–36.0)
MCV: 94.3 fL (ref 80.0–100.0)
Monocytes Absolute: 0.6 10*3/uL (ref 0.1–1.0)
Monocytes Relative: 5 %
Neutro Abs: 7.3 10*3/uL (ref 1.7–7.7)
Neutrophils Relative %: 70 %
Platelets: 262 10*3/uL (ref 150–400)
RBC: 4.2 MIL/uL (ref 3.87–5.11)
RDW: 12.6 % (ref 11.5–15.5)
WBC: 10.4 10*3/uL (ref 4.0–10.5)
nRBC: 0 % (ref 0.0–0.2)

## 2022-10-11 ENCOUNTER — Other Ambulatory Visit: Payer: Self-pay | Admitting: Psychiatry

## 2022-10-16 ENCOUNTER — Ambulatory Visit
Admission: RE | Admit: 2022-10-16 | Discharge: 2022-10-16 | Disposition: A | Discharge status: Home / Self Care | Payer: 59 | Source: Ambulatory Visit | Attending: Internal Medicine | Admitting: Internal Medicine

## 2022-10-16 DIAGNOSIS — E2839 Other primary ovarian failure: Secondary | ICD-10-CM | POA: Insufficient documentation

## 2022-10-16 DIAGNOSIS — Z1231 Encounter for screening mammogram for malignant neoplasm of breast: Secondary | ICD-10-CM

## 2022-10-16 DIAGNOSIS — Z13820 Encounter for screening for osteoporosis: Secondary | ICD-10-CM | POA: Diagnosis present

## 2022-10-28 ENCOUNTER — Ambulatory Visit: Payer: Self-pay

## 2022-10-28 NOTE — Telephone Encounter (Signed)
  Chief Complaint: Falls Symptoms: 2 falls past year Frequency: Thursday Pertinent Negatives: Patient denies Pain, broken skin Disposition: '[]'$ ED /'[]'$ Urgent Care (no appt availability in office) / '[x]'$ Appointment(In office/virtual)/ '[]'$  [place] [facility]/ '[]'$ Home Care/ '[]'$ Refused Recommended Disposition /'[]'$ [place] [facility]/ '[]'$  Follow-up with PCP Additional Notes: Pt fell on  Thursday while getting into a cargo [place]. PT is unsure if falls are from medication or if there is a neurological problem. Pt unable to be seen until Thurs d/t transportation issues.    Reason for Disposition  [1] Falling (two or more falls) AND [2] in past year  Answer Assessment - Initial Assessment Questions 1. MECHANISM: "How did the fall happen?"     Getting into cargo van, stepping into the [place] tripped and fell 2. DOMESTIC VIOLENCE AND ELDER ABUSE SCREENING: "Did you fall because someone pushed you or tried to hurt you?" If Yes, ask: "Are you safe now?"     no 3. ONSET: "When did the fall happen?" (e.g., minutes, hours, or days ago)     Thursday 4. LOCATION: "What part of the body hit the ground?" (e.g., back, buttocks, head, hips, knees, hands, head, stomach)     Left leg 5. INJURY: "Did you hurt (injure) yourself when you fell?" If Yes, ask: "What did you injure? Tell me more about this?" (e.g., body area; type of injury; pain severity)"     Fell on same leg that was hurt before 6. PAIN: "Is there any pain?" If Yes, ask: "How bad is the pain?" (e.g., Scale 1-10; or mild,  moderate, severe)   - NONE (0): No pain   - MILD (1-3): Doesn't interfere with normal activities    - MODERATE (4-7): Interferes with normal activities or awakens from sleep    - SEVERE (8-10): Excruciating pain, unable to do any normal activities      no 7. SIZE: For cuts, bruises, or swelling, ask: "How large is it?" (e.g., inches or centimeters)      Bruise is lower left ankle - 50 cent bruised 8. PREGNANCY: "Is there any chance  you are pregnant?" "When was your last menstrual period?"      9. OTHER SYMPTOMS: "Do you have any other symptoms?" (e.g., dizziness, fever, weakness; new onset or worsening).      Clumsy 10. CAUSE: "What do you think caused the fall (or falling)?" (e.g., tripped, dizzy spell)       unsure  Protocols used: Falls and [facility]

## 2022-10-30 NOTE — Progress Notes (Unsigned)
Name: Carla Thompson   MRN: [id number]    DOB: 05-06-1961   Date:10/31/2022       Progress Note  Subjective  Chief Complaint  Recent Falls  HPI  Osteopenia: FRAX score was high but not enough to start on medication for osteoporosis. Discussed importance of increasing physical activity, high calcium and taking vitamin D daily   FRAX* RESULTS:  (version: 3.5) 10-year Probability of Fracture1 Major Osteoporotic Fracture2 Hip Fracture 17.1% 2.6% Population: [place] (Caucasian) Risk Factors: History of Fracture (Adult)  Neck weakness: she has been doing home exercises and is doing better.    GERD:  she is now taking Famotidine every day and seems to be helping with symptoms, very seldom has nausea but per Dr. Clovis Pu secondary to psychiatric medication. Unchanged   Chronic constipation: she is taking Trulance daily and when she feels constipated takes Lactulose prn.Under the care of Dr. Vicente Males ,no longer having soiling. She states harder when she travels    Schizophrenia: Seeing psychiatrist, Dr. Clovis Pu  . No longer has hallucinations. She has been cooking and cleaning again, she also likes to read .  No paranoia or hallucinations. She does not drive because of fear or getting in an accident secondary to psychiatric medications. She has two grandsons now  from her daughter Carla Thompson. Stable    Tachycardia: doing well on Lopressor half pill twice daily, she denies SOB or chest pain, denies palpitation. Unchanged   History of dysplastic nevus: under the care of Dr. Nehemiah Massed and is going back for 3 months full body scan soon  Low back pain/deconditioning: she states continues to have low back pain, she had PT in the past, she is still doing home exercises but does not want to go back to PT at this time. Discussed stretching    Hypertriglyceridemia: on life style modification only. Eating oatmeal three days a week, reviewed last labs with patient    The 10-year ASCVD risk score (Arnett DK, et  al., 2019) is: 2.6%   Values used to calculate the score:     Age: 4 years     Sex: Female     Is Non-Hispanic African American: No     Diabetic: No     Tobacco smoker: No     Systolic Blood Pressure: 081 mmHg     Is BP treated: No     HDL Cholesterol: 63 mg/dL     Total Cholesterol: 235 mg/dL    Patient Active Problem List   Diagnosis Date Noted   Vitamin D deficiency 05/15/2022   Hyperlipidemia 11/20/2017   GERD without esophagitis 02/21/2017   Tachycardia 02/20/2016   RLS (restless legs syndrome) 12/21/2015   Irritable bowel syndrome with constipation 12/21/2015   Other long term (current) drug therapy 12/08/2015   Chronic constipation 12/08/2015   Insomnia, persistent 12/08/2015   Dermatitis, eczematoid 12/08/2015   Gravida 2 para 2 12/08/2015   Hypertriglyceridemia 44/81/8563   Metallic taste 14/97/0263   Female climacteric state 12/08/2015   Schizoaffective disorder, bipolar type ([place]) 12/08/2015    Past Surgical History:  Procedure Laterality Date   ANUS SURGERY  2000   [place]   COLONOSCOPY     COLONOSCOPY WITH PROPOFOL N/A 11/30/2020   Procedure: COLONOSCOPY WITH PROPOFOL;  Surgeon: [name], MD;  Location: [facility] ENDOSCOPY;  Service: Gastroenterology;  Laterality: N/A;   TUBAL LIGATION  12/02/1994    Family History  Problem Relation Age of Onset   Mental illness Sister  Schizophrenia and bipolar   Cancer Maternal Grandmother        Colon   Mental illness Paternal Grandmother        Schizophrenia   Breast cancer Neg Hx     Social History   Tobacco Use   Smoking status: Never   Smokeless tobacco: Never  Substance Use Topics   Alcohol use: No    Alcohol/week: 0.0 standard drinks of alcohol     Current Outpatient Medications:    Ascorbic Acid (VITAMIN C) 500 MG CHEW, Chew by mouth., Disp: , Rfl:    clonazePAM (KLONOPIN) 0.5 MG tablet, TAKE 1 AND 1/2 TABLETS BY MOUTH EVERY MORNING AND EVERY NIGHT AT BEDTIME, Disp: 90 tablet, Rfl: 5    clonazePAM (KLONOPIN) 1 MG tablet, TAKE 1/2 TABLET BY MOUTH IN THE MORNING, AT NOON, AND AT BEDTIME, Disp: 45 tablet, Rfl: 2   cloZAPine (CLOZARIL) 100 MG tablet, 4 tabs HS, Disp: 120 tablet, Rfl: 6   famotidine (PEPCID) 40 MG tablet, TAKE 1 TABLET(40 MG) BY MOUTH DAILY, Disp: 90 tablet, Rfl: 1   lithium 300 MG tablet, TAKE 2 TABLETS(600 MG) BY MOUTH AT BEDTIME, Disp: 180 tablet, Rfl: 1   Magnesium 400 MG CAPS, Take by mouth., Disp: , Rfl:    metoprolol tartrate (LOPRESSOR) 25 MG tablet, Take 0.5 tablets (12.5 mg total) by mouth 2 (two) times daily., Disp: 90 tablet, Rfl: 1   mupirocin ointment (BACTROBAN) 2 %, Apply 1 Application topically daily. With dressing changes, Disp: 22 g, Rfl: 0   TRULANCE 3 MG TABS, TAKE 1 TABLET BY MOUTH DAILY, Disp: 90 tablet, Rfl: 3   Vitamin D, Ergocalciferol, (DRISDOL) 1.25 MG (50000 UNIT) CAPS capsule, TAKE 1 CAPSULE BY MOUTH EVERY 7 DAYS, Disp: 12 capsule, Rfl: 1   zinc gluconate 50 MG tablet, Take 50 mg by mouth daily., Disp: , Rfl:   Allergies  Allergen Reactions   Levsin [Hyoscyamine Sulfate] Hives   Escitalopram Hives    I personally reviewed active problem list, medication list, allergies, family history, social history, health maintenance with the patient/caregiver today.   ROS  Constitutional: Negative for fever or weight change.  Respiratory: Negative for cough and shortness of breath.   Cardiovascular: Negative for chest pain or palpitations.  Gastrointestinal: Negative for abdominal pain, no bowel changes.  Musculoskeletal: Negative for gait problem or joint swelling.  Skin: Negative for rash.  Neurological: Negative for dizziness or headache.  No other specific complaints in a complete review of systems (except as listed in HPI above).   Objective  Vitals:   10/31/22 1013  BP: 112/70  Pulse: 92  Resp: 16  Temp: 98.6 F (37 C)  TempSrc: Oral  SpO2: 98%  Weight: 144 lb (65.3 kg)  Height: '5\' 4"'$  (1.626 m)    Body mass index is  24.72 kg/m.  Physical Exam  Constitutional: Patient appears well-developed and well-nourished.  No distress.  HEENT: head atraumatic, normocephalic, pupils equal and reactive to light, ears - scab on left ear lobe , neck supple Cardiovascular: Normal rate, regular rhythm and normal heart sounds.  No murmur heard. No BLE edema. Pulmonary/Chest: Effort normal and breath sounds normal. No respiratory distress. Abdominal: Soft.  There is no tenderness. Psychiatric: Patient has a normal mood and affect. behavior is normal. Judgment and thought content normal.  Muscular skeletal: slow gait, weak neck muscles   Recent Results (from the past 2160 hour(s))  CBC with Differential/Platelet     Status: None   Collection Time: 08/12/22  12:10 PM  Result Value Ref Range   WBC 8.7 4.0 - 10.5 K/uL   RBC 4.04 3.87 - 5.11 MIL/uL   Hemoglobin 12.0 12.0 - 15.0 g/dL   HCT 38.0 36.0 - 46.0 %   MCV 94.1 80.0 - 100.0 fL   MCH 29.7 26.0 - 34.0 pg   MCHC 31.6 30.0 - 36.0 g/dL   RDW 12.3 11.5 - 15.5 %   Platelets 252 150 - 400 K/uL   nRBC 0.0 0.0 - 0.2 %   Neutrophils Relative % 73 %   Neutro Abs 6.3 1.7 - 7.7 K/uL   Lymphocytes Relative 18 %   Lymphs Abs 1.6 0.7 - 4.0 K/uL   Monocytes Relative 6 %   Monocytes Absolute 0.5 0.1 - 1.0 K/uL   Eosinophils Relative 2 %   Eosinophils Absolute 0.2 0.0 - 0.5 K/uL   Basophils Relative 1 %   Basophils Absolute 0.1 0.0 - 0.1 K/uL   Immature Granulocytes 0 %   Abs Immature Granulocytes 0.02 0.00 - 0.07 K/uL    Comment: Performed at [facility], [address] [place], [place] [address]  CBC with Differential/Platelet     Status: None   Collection Time: 09/10/22  6:54 AM  Result Value Ref Range   WBC 8.8 4.0 - 10.5 K/uL   RBC 4.23 3.87 - 5.11 MIL/uL   Hemoglobin 12.5 12.0 - 15.0 g/dL   HCT 40.6 36.0 - 46.0 %   MCV 96.0 80.0 - 100.0 fL   MCH 29.6 26.0 - 34.0 pg   MCHC 30.8 30.0 - 36.0 g/dL   RDW 12.8 11.5 - 15.5 %   Platelets 271 150 - 400 K/uL    nRBC 0.0 0.0 - 0.2 %   Neutrophils Relative % 55 %   Neutro Abs 4.9 1.7 - 7.7 K/uL   Lymphocytes Relative 32 %   Lymphs Abs 2.8 0.7 - 4.0 K/uL   Monocytes Relative 7 %   Monocytes Absolute 0.6 0.1 - 1.0 K/uL   Eosinophils Relative 5 %   Eosinophils Absolute 0.4 0.0 - 0.5 K/uL   Basophils Relative 1 %   Basophils Absolute 0.1 0.0 - 0.1 K/uL   Immature Granulocytes 0 %   Abs Immature Granulocytes 0.03 0.00 - 0.07 K/uL    Comment: Performed at [facility], [address] [place], [place] [address]  CBC with Differential/Platelet     Status: None   Collection Time: 10/07/22 12:14 PM  Result Value Ref Range   WBC 10.4 4.0 - 10.5 K/uL   RBC 4.20 3.87 - 5.11 MIL/uL   Hemoglobin 12.4 12.0 - 15.0 g/dL   HCT 39.6 36.0 - 46.0 %   MCV 94.3 80.0 - 100.0 fL   MCH 29.5 26.0 - 34.0 pg   MCHC 31.3 30.0 - 36.0 g/dL   RDW 12.6 11.5 - 15.5 %   Platelets 262 150 - 400 K/uL   nRBC 0.0 0.0 - 0.2 %   Neutrophils Relative % 70 %   Neutro Abs 7.3 1.7 - 7.7 K/uL   Lymphocytes Relative 21 %   Lymphs Abs 2.2 0.7 - 4.0 K/uL   Monocytes Relative 5 %   Monocytes Absolute 0.6 0.1 - 1.0 K/uL   Eosinophils Relative 3 %   Eosinophils Absolute 0.3 0.0 - 0.5 K/uL   Basophils Relative 1 %   Basophils Absolute 0.1 0.0 - 0.1 K/uL   Immature Granulocytes 0 %   Abs Immature Granulocytes 0.04 0.00 - 0.07  K/uL    Comment: Performed at [facility], [address] [place], [place] [address]    PHQ2/9:    10/31/2022   10:09 AM 08/08/2022    2:17 PM 05/15/2022    3:32 PM 02/20/2022    3:14 PM 01/23/2022    1:39 PM  Depression screen PHQ 2/9  Decreased Interest 0 0 0 0 0  Down, Depressed, Hopeless 0 0 0 0 0  PHQ - 2 Score 0 0 0 0 0  Altered sleeping 0 0  0 0  Tired, decreased energy 0 0  0 0  Change in appetite 0 0  0 0  Feeling bad or failure about yourself  0 0  0 0  Trouble concentrating 0 0  0 0  Moving slowly or fidgety/restless 0 0  0 0  Suicidal thoughts 0 0  0 0  PHQ-9 Score  0 0  0 0  Difficult doing work/chores Not difficult at all        phq 9 is negative   Fall Risk:    10/31/2022   10:17 AM 08/08/2022    2:17 PM 05/15/2022    3:32 PM 02/20/2022    3:14 PM 01/23/2022    1:39 PM  Fall Risk   Falls in the past year? '1 1 1 1 1  '$ Number falls in past yr: 1 1 0 1 1  Injury with Fall? 0 0 '1 1 1  '$ Risk for fall due to : Impaired balance/gait No Fall Risks No Fall Risks History of fall(s) History of fall(s)  Follow up Falls prevention discussed;Education provided;Falls evaluation completed Falls prevention discussed Falls prevention discussed Falls prevention discussed Falls prevention discussed      Functional Status Survey: Is the patient deaf or have difficulty hearing?: No Does the patient have difficulty seeing, even when wearing glasses/contacts?: No Does the patient have difficulty concentrating, remembering, or making decisions?: No Does the patient have difficulty walking or climbing stairs?: No Does the patient have difficulty dressing or bathing?: No Does the patient have difficulty doing errands alone such as visiting a doctor's office or shopping?: No    Assessment & Plan  There are no diagnoses linked to this encounter.

## 2022-10-31 ENCOUNTER — Ambulatory Visit (INDEPENDENT_AMBULATORY_CARE_PROVIDER_SITE_OTHER): Payer: 59 | Admitting: Family Medicine

## 2022-10-31 ENCOUNTER — Encounter: Payer: Self-pay | Admitting: Family Medicine

## 2022-10-31 VITALS — BP 112/70 | HR 92 | Temp 98.6°F | Resp 16 | Ht 64.0 in | Wt 144.0 lb

## 2022-10-31 DIAGNOSIS — D485 Neoplasm of uncertain behavior of skin: Secondary | ICD-10-CM

## 2022-10-31 DIAGNOSIS — K5909 Other constipation: Secondary | ICD-10-CM

## 2022-10-31 DIAGNOSIS — E559 Vitamin D deficiency, unspecified: Secondary | ICD-10-CM | POA: Diagnosis not present

## 2022-10-31 DIAGNOSIS — R29898 Other symptoms and signs involving the musculoskeletal system: Secondary | ICD-10-CM

## 2022-10-31 DIAGNOSIS — M40292 Other kyphosis, cervical region: Secondary | ICD-10-CM

## 2022-10-31 DIAGNOSIS — K219 Gastro-esophageal reflux disease without esophagitis: Secondary | ICD-10-CM | POA: Diagnosis not present

## 2022-10-31 DIAGNOSIS — F250 Schizoaffective disorder, bipolar type: Secondary | ICD-10-CM | POA: Diagnosis not present

## 2022-10-31 DIAGNOSIS — R000 Tachycardia, unspecified: Secondary | ICD-10-CM | POA: Diagnosis not present

## 2022-10-31 DIAGNOSIS — L720 Epidermal cyst: Secondary | ICD-10-CM

## 2022-10-31 DIAGNOSIS — M5382 Other specified dorsopathies, cervical region: Secondary | ICD-10-CM

## 2022-10-31 MED ORDER — MUPIROCIN 2 % EX OINT: 1.0000 | TOPICAL_OINTMENT | Freq: Every day | CUTANEOUS | 0 refills | Status: DC

## 2022-10-31 MED ORDER — METOPROLOL TARTRATE 25 MG PO TABS: 12.5000 mg | ORAL_TABLET | Freq: Two times a day (BID) | ORAL | 1 refills | Status: DC

## 2022-10-31 NOTE — Addendum Note (Signed)
Addended by: Steele Sizer F on: 10/31/2022 10:55 AM   Modules accepted: Orders

## 2022-10-31 NOTE — Patient Instructions (Signed)

## 2022-11-04 ENCOUNTER — Other Ambulatory Visit
Admission: RE | Admit: 2022-11-04 | Discharge: 2022-11-04 | Disposition: A | Discharge status: Home / Self Care | Payer: 59 | Attending: Psychiatry | Admitting: Psychiatry

## 2022-11-04 ENCOUNTER — Other Ambulatory Visit: Payer: Self-pay | Admitting: Psychiatry

## 2022-11-04 DIAGNOSIS — Z79899 Other long term (current) drug therapy: Secondary | ICD-10-CM

## 2022-11-04 DIAGNOSIS — F250 Schizoaffective disorder, bipolar type: Secondary | ICD-10-CM | POA: Insufficient documentation

## 2022-11-04 DIAGNOSIS — R7989 Other specified abnormal findings of blood chemistry: Secondary | ICD-10-CM | POA: Diagnosis present

## 2022-11-04 LAB — CBC WITH DIFFERENTIAL/PLATELET
Abs Immature Granulocytes: 0.02 10*3/uL (ref 0.00–0.07)
Basophils Absolute: 0.1 10*3/uL (ref 0.0–0.1)
Basophils Relative: 1 %
Eosinophils Absolute: 0.3 10*3/uL (ref 0.0–0.5)
Eosinophils Relative: 4 %
HCT: 38.5 % (ref 36.0–46.0)
Hemoglobin: 11.9 g/dL — ABNORMAL LOW (ref 12.0–15.0)
Immature Granulocytes: 0 %
Lymphocytes Relative: 36 %
Lymphs Abs: 3.4 10*3/uL (ref 0.7–4.0)
MCH: 29.7 pg (ref 26.0–34.0)
MCHC: 30.9 g/dL (ref 30.0–36.0)
MCV: 96 fL (ref 80.0–100.0)
Monocytes Absolute: 0.6 10*3/uL (ref 0.1–1.0)
Monocytes Relative: 7 %
Neutro Abs: 4.9 10*3/uL (ref 1.7–7.7)
Neutrophils Relative %: 52 %
Platelets: 266 10*3/uL (ref 150–400)
RBC: 4.01 MIL/uL (ref 3.87–5.11)
RDW: 12.8 % (ref 11.5–15.5)
WBC: 9.3 10*3/uL (ref 4.0–10.5)
nRBC: 0 % (ref 0.0–0.2)

## 2022-11-21 ENCOUNTER — Encounter: Payer: Self-pay | Admitting: Psychiatry

## 2022-11-28 ENCOUNTER — Ambulatory Visit: Payer: Self-pay

## 2022-11-28 NOTE — Telephone Encounter (Signed)
  Chief Complaint: rash Symptoms: Rash on R side of nose, hard to describe per pt, ointment not helping Frequency: 1 month or more  Pertinent Negatives: NA Disposition: '[]'$ ED /'[]'$ Urgent Care (no appt availability in office) / '[x]'$ Appointment(In office/virtual)/ '[]'$  [facility]/ '[]'$ Home Care/ '[]'$ Refused Recommended Disposition /'[]'$ [place] [facility]/ '[]'$  Follow-up with PCP Additional Notes: pt has appt with dermatology but not until 01/07/23. Pt states ointment hasn't helped at all. Has started using Pers face cream and trying different sunblocks but unsure what else to do. Pt just concerned cancerous and wanting it looked at. Scheduled appt with Dr. Rosana Berger tomorrow at 1540.    Summary: Rash on nose/ ointment not helping   The patient called in stating the medicine that was prescribed for the rash on her nose, mupirocin ointment (BACTROBAN) 2 % has not helped at all. She is waiting to see her dermatologist, Dr Nehemiah Massed for further evaluation of this and previous skin issues. She is nervous of it possibly being cancer and that her insurance will start over at the first of the year. Please assist patient further         Reason for Disposition  [1] Caller has NON-URGENT medicine question about med that PCP prescribed AND [2] triager unable to answer question  Answer Assessment - Initial Assessment Questions 1. NAME of MEDICINE: "What medicine(s) are you calling about?"     Bactroban ointment  2. QUESTION: "What is your question?" (e.g., double dose of medicine, side effect)     Not helping with rash on nose at all  3. PRESCRIBER: "Who prescribed the medicine?" Reason: if prescribed by specialist, call should be referred to that group.     Dr. Ancil Boozer  4. SYMPTOMS: "Do you have any symptoms?" If Yes, ask: "What symptoms are you having?"  "How bad are the symptoms (e.g., mild, moderate, severe)     Rash on nose, R sided long rash  Protocols used: Medication Question Call-A-AH

## 2022-11-28 NOTE — Progress Notes (Signed)
   Acute Office Visit  Subjective:     Patient ID: Carla Thompson, female    DOB: 1961-11-28, 61 y.o.   MRN: [id number]  Chief Complaint  Patient presents with   Rash    On nose    HPI Patient is in today for rash on the right side of the nose. She noticed it a few weeks ago.  She states the rash is red, dry and flaky.  It is mainly on the right side of her nose.  She does not have it anywhere else.  She does have a history of eczema.  She does have a dermatologist, who is given her samples of multiple kinds of moisturizers with SPF to use during the day.  She is also been using bacitracin on the rash.  She denies itching or pain.  The rash does not appear to be spreading.  She would also like to discuss her vitamin D intake and her recent bone scan results.  It looks like she has osteopenia in her right hip and lumbar spine according to her DEXA scan from 10/16/2022.  She is currently on vitamin D supplements 50,000 IU weekly.  She also walks up and down a long hallway in her house multiple times a day for exercise and bone strengthening.   Review of Systems  Skin:  Positive for rash.        Objective:    BP 116/74   Pulse 83   Temp 97.6 F (36.4 C)   Resp 16   Ht '5\' 4"'$  (1.626 m)   Wt 142 lb 3.2 oz (64.5 kg)   LMP 12/16/2016 (Approximate)   SpO2 100%   BMI 24.41 kg/m    Physical Exam Constitutional:      Appearance: Normal appearance.  HENT:     Head: Normocephalic and atraumatic.  Eyes:     Conjunctiva/sclera: Conjunctivae normal.  Cardiovascular:     Rate and Rhythm: Normal rate.  Pulmonary:     Effort: Pulmonary effort is normal.     Breath sounds: Normal breath sounds.  Skin:    General: Skin is warm and dry.     Comments: Erythema, dry skin and flaking in the bilateral corners of the nasolabial folds  Neurological:     Mental Status: She is alert. Mental status is at baseline.  Psychiatric:        Mood and Affect: Mood normal.        Behavior:  Behavior normal.     No results found for any visits on 11/29/22.      Assessment & Plan:   1. Dry skin dermatitis: Discussed how this is common this time a year when it is cold outside.  Continue moisturizers as dermatology has recommended for daytime, recommend Neutrogena hyaluronic acid cold pressed serum at night to help with dry flaking skin.  Also recommend staying well-hydrated.  2. Osteopenia of multiple sites: Reviewed patient's DEXA scan with her from November of this year.  DEXA scan showing osteopenia, she is currently on vitamin D supplements and participating in weightbearing exercises.  Return if symptoms worsen or fail to improve.  Teodora Medici, DO

## 2022-11-29 ENCOUNTER — Ambulatory Visit (INDEPENDENT_AMBULATORY_CARE_PROVIDER_SITE_OTHER): Payer: 59 | Admitting: Internal Medicine

## 2022-11-29 ENCOUNTER — Encounter: Payer: Self-pay | Admitting: Internal Medicine

## 2022-11-29 VITALS — BP 116/74 | HR 83 | Temp 97.6°F | Resp 16 | Ht 64.0 in | Wt 142.2 lb

## 2022-11-29 DIAGNOSIS — L853 Xerosis cutis: Secondary | ICD-10-CM

## 2022-11-29 DIAGNOSIS — M8589 Other specified disorders of bone density and structure, multiple sites: Secondary | ICD-10-CM | POA: Diagnosis not present

## 2022-11-29 NOTE — Patient Instructions (Addendum)
It was great seeing you today!  Plan discussed at today's visit: -Recommend Neutrogena Hydro-Boost night pressed moisturizer for face at night   Follow up in: as needed  Take care and let us know if you have any questions or concerns prior to your next visit.  Dr. Rosana Berger

## 2022-12-03 ENCOUNTER — Other Ambulatory Visit
Admission: RE | Admit: 2022-12-03 | Discharge: 2022-12-03 | Disposition: A | Discharge status: Home / Self Care | Payer: 59 | Attending: Psychiatry | Admitting: Psychiatry

## 2022-12-03 DIAGNOSIS — F250 Schizoaffective disorder, bipolar type: Secondary | ICD-10-CM | POA: Diagnosis present

## 2022-12-03 DIAGNOSIS — Z79899 Other long term (current) drug therapy: Secondary | ICD-10-CM | POA: Insufficient documentation

## 2022-12-03 LAB — CBC WITH DIFFERENTIAL/PLATELET
Abs Immature Granulocytes: 0.02 10*3/uL (ref 0.00–0.07)
Basophils Absolute: 0.1 10*3/uL (ref 0.0–0.1)
Basophils Relative: 1 %
Eosinophils Absolute: 0.3 10*3/uL (ref 0.0–0.5)
Eosinophils Relative: 3 %
HCT: 38.8 % (ref 36.0–46.0)
Hemoglobin: 12.3 g/dL (ref 12.0–15.0)
Immature Granulocytes: 0 %
Lymphocytes Relative: 25 %
Lymphs Abs: 2.1 10*3/uL (ref 0.7–4.0)
MCH: 30.1 pg (ref 26.0–34.0)
MCHC: 31.7 g/dL (ref 30.0–36.0)
MCV: 94.9 fL (ref 80.0–100.0)
Monocytes Absolute: 0.6 10*3/uL (ref 0.1–1.0)
Monocytes Relative: 7 %
Neutro Abs: 5.4 10*3/uL (ref 1.7–7.7)
Neutrophils Relative %: 64 %
Platelets: 245 10*3/uL (ref 150–400)
RBC: 4.09 MIL/uL (ref 3.87–5.11)
RDW: 12.8 % (ref 11.5–15.5)
WBC: 8.5 10*3/uL (ref 4.0–10.5)
nRBC: 0 % (ref 0.0–0.2)

## 2022-12-30 ENCOUNTER — Other Ambulatory Visit
Admission: RE | Admit: 2022-12-30 | Discharge: 2022-12-30 | Disposition: A | Discharge status: Home / Self Care | Payer: 59 | Attending: Psychiatry | Admitting: Psychiatry

## 2022-12-30 DIAGNOSIS — F250 Schizoaffective disorder, bipolar type: Secondary | ICD-10-CM

## 2022-12-30 DIAGNOSIS — Z79899 Other long term (current) drug therapy: Secondary | ICD-10-CM | POA: Insufficient documentation

## 2022-12-30 LAB — CBC WITH DIFFERENTIAL/PLATELET
Abs Immature Granulocytes: 0.02 10*3/uL (ref 0.00–0.07)
Basophils Absolute: 0.1 10*3/uL (ref 0.0–0.1)
Basophils Relative: 1 %
Eosinophils Absolute: 0.3 10*3/uL (ref 0.0–0.5)
Eosinophils Relative: 3 %
HCT: 39.4 % (ref 36.0–46.0)
Hemoglobin: 12 g/dL (ref 12.0–15.0)
Immature Granulocytes: 0 %
Lymphocytes Relative: 23 %
Lymphs Abs: 1.9 10*3/uL (ref 0.7–4.0)
MCH: 29.1 pg (ref 26.0–34.0)
MCHC: 30.5 g/dL (ref 30.0–36.0)
MCV: 95.4 fL (ref 80.0–100.0)
Monocytes Absolute: 0.5 10*3/uL (ref 0.1–1.0)
Monocytes Relative: 6 %
Neutro Abs: 5.6 10*3/uL (ref 1.7–7.7)
Neutrophils Relative %: 67 %
Platelets: 227 10*3/uL (ref 150–400)
RBC: 4.13 MIL/uL (ref 3.87–5.11)
RDW: 12.5 % (ref 11.5–15.5)
WBC: 8.4 10*3/uL (ref 4.0–10.5)
nRBC: 0 % (ref 0.0–0.2)

## 2023-01-02 ENCOUNTER — Telehealth: Payer: Self-pay | Admitting: Psychiatry

## 2023-01-02 NOTE — Telephone Encounter (Signed)
Usually Carla Thompson enters the lab results and [facility] but it had not been entered yet.  I entered it.  So she should be okay to refill the clozapine now.

## 2023-01-02 NOTE — Telephone Encounter (Signed)
Pt Lvm 1/31 @ 4:33p.  She said she needs the lab work sent to [facility] for her to get the Clozapine.  She didn't say which [facility] to send it to I tried to call and confirm which location, but had to leave a vm that we would assume it's the current one where the med was sent and if it's not, she needs to call us back.  Next appt 2/13

## 2023-01-02 NOTE — Telephone Encounter (Signed)
I think pt needs results uploaded to [facility],if they are already uploaded please let me know because labs will need to be printed and faxed after all if so.

## 2023-01-06 ENCOUNTER — Encounter: Payer: Self-pay | Admitting: Psychiatry

## 2023-01-06 ENCOUNTER — Ambulatory Visit (INDEPENDENT_AMBULATORY_CARE_PROVIDER_SITE_OTHER): Payer: 59 | Admitting: Psychiatry

## 2023-01-06 VITALS — Wt 141.0 lb

## 2023-01-06 DIAGNOSIS — Z79899 Other long term (current) drug therapy: Secondary | ICD-10-CM

## 2023-01-06 DIAGNOSIS — F250 Schizoaffective disorder, bipolar type: Secondary | ICD-10-CM

## 2023-01-06 DIAGNOSIS — F4001 Agoraphobia with panic disorder: Secondary | ICD-10-CM | POA: Diagnosis not present

## 2023-01-06 DIAGNOSIS — K5909 Other constipation: Secondary | ICD-10-CM

## 2023-01-06 MED ORDER — CLOZAPINE 100 MG PO TABS: ORAL_TABLET | ORAL | 6 refills | Status: DC

## 2023-01-06 MED ORDER — LITHIUM CARBONATE 300 MG PO TABS: ORAL_TABLET | ORAL | 1 refills | Status: DC

## 2023-01-06 MED ORDER — CLONAZEPAM 1 MG PO TABS: 0.5000 mg | ORAL_TABLET | Freq: Three times a day (TID) | ORAL | 5 refills | Status: DC

## 2023-01-06 NOTE — Progress Notes (Addendum)
Carla Thompson [id number] 14-Aug-1961 62 y.o.  Subjective:   Patient ID:  Carla Thompson is a 62 y.o. (DOB January 10, 1961) female.  Chief Complaint:  Chief Complaint  Patient presents with   Follow-up    Schizoaffective disorder, bipolar type [facility])    HPI Carla Thompson presents to the office today for follow-up of schizoaffective disorder.  visit October, 2020.  No med changes.  She continued on clozapine 400, lithium 600, and clonazepam 0.5 twice daily as needed.   03/09/20 appt noted: StepF had been sick with Covid and other problems.  Died.  Other family members with Covid problems too and died. Carla Thompson worked from home Nov 08, 2023 to April  Good overall without complaints.  Stress dealing with Covid.  Trying to stay active including cooking.  No major episodes of confusion nor voices.    No fear.  Does emails on the computer.  Not much anxiety lately.  Sleep same 9-9.  No fear episodes.  No manic nor depressive episodes.  No significant panic attacks recently.  She is not avoiding things out of anxiety generally.  She's still active at church.   Large infrequent BM clog toilet and intermittently not well managed.  Plan: Still problems with large infrequent q 4-7 days, large BM.  Often clogs the toilet.  Not well managed. Increase Linzess trial to 290 mg daily to see if it's better managed.  09/07/20 appt with following noted: She has notes. Balance problems, movements in feet legs, stomach.  Marching movements in feet.  Scares her. 1 fall on stairs going up. Wonders about reducing clozapine to 3 instead of 4 bc of tiredness. B researched lithium and said lithium and clozapine are "bad medicines with a lot of SE".   she's worried about SE. Disc ran out of clozapine for 2 weeks and decompensated.  back on it for almost a month.  Pharmacy was saying they couldn't dispense clozapine without labs and they didn't have the labs.  Carla Thompson says she's not back to normal yet bc can't focus and make simple  decisions.  For example couldn't decide whether to drink water out of the faucet or out of Brita water filter.  Lost 10# and not eating well.  Poor appetite.   Severely constipated chronically.  Using miralax.  Plan: continue clozapine  11/09/20 appt with following noted:  Seen with Carla Thompson CO problems with chronic constipation and using Miralax twice daily.  It is helpful but varies.  Doesn't like clozapine for this reason.  Usual trouble is leakage from the stool.  Disc sphincter exercises.   Lost weight. Some depression over the TD including feet moving up and down.  Someitmes stomach moves.  Occ tremor but not lately.  Trouble with sleep, initial and middle. Some of the problems with getting clozapine may have been related to not getting th labs to the correct [facility] in [place].  Doesn't like prunes or prune juice..  Not attending church due to having to wear depends and "tardive dyskinesia".   Plan: Can switch to clonazepam 0.5 mg in am and 1.0 mg HS  02/20/2021 appointment with the following noted: Walks outside.   Carla Thompson got Covid for 3 weeks.  She got milder sx with assumption she had it also. No problems getting clozapine since here.  Have worked on getting it more consistently and that seems to be working.    Better than 6 mos ago.  Stronger physically and mentally.  No sig panic attacks now. Balance  is better.  Weight loss stopped.  Not depressed.   Better with more consistency with clozapine. Exercising more and that's helped.  Plan: Continue clozapine 400 mg HS  (She prefers all at once at night) Contnue lithium 600 mg HS Panic disorder controlled. clonazepam 0.75 mg BID   07/26/2021 appointment with the following noted: seen with Carla Thompson Hard time swallowing pills.  Disc jury duty.  She doesn't feel capable.  Doesn't drive.   Anxious about taking pills but otherwise is usually doing ok.  Tryitng to walk 6 times weekly and physically feels better.   Patient reports stable mood  and denies depressed or irritable moods.  Patient denies any recent difficulty with anxiety.  Patient denies difficulty with sleep initiation or maintenance. Denies appetite disturbance.  Patient reports that energy and motivation have been good.  Patient with chronic difficulty with concentration.  Patient denies any suicidal ideation. No problems getting clozapine lately. Plan: No med changes  01/22/2022 appointment with the following noted: Continues clozapine 400 mg nightly, lithium 600 mg nightly, clonazepam 0.75 mg twice daily For the most part doing well.  Except fell and hurt tailbone.  Fell in the dark at night and was disoriented to space and tripped. It continues to hurt after a couple of weeks. Asks about reducing clozapine to 3 nightly. No panic attacks since here. Plan: No med changes:  Continue clozapine 400 mg HS  (She prefers all at once at night) Lithium 600 mg HS clonazepam 0.75 mg BID  07/10/2022 appointment noted: seen with Carla Thompson per usual Has notes.  [place] [address] getting into car and asked about iburprofen.  Feb fall and broke tailbone.  Trying to be careful about falling.  Wonders if meds contribute to fall risk.  Asks why need 4 clozapine. Still moves feet at times when not needed. Sun AM sometimes hard to stay awake.  Never falls asleep. Not driven in 11 years. No panic. No paranoia and psychosis. Plan: no med changes.    01/06/23 appt noted:  seen with Carla Thompson per usual Has notes.  Keeping busy.  Trying to exercise but afraid of stairs bc afraid of falling.  Last year 4 falls.  Did some exercises including walking some stairs for a couple months.  I think it may have helped but she stopped it bc fear of falling.  Does some exercising.   Generally not dizzy upon standing except when first awakens. Has continued meds consistently.   Some mild tremor. Will move her feet at church and wonder if it is related to clozapine. No sig psychotic sx of delusions or hallucinations.   Chronic  frustration with clozapine bc constipation and balance issues.  Sometimes interferes with activities. She doesn't remember what her sx were like when poorly controlled.  Oldest daughter married March 2020  On clozapine for many years with benefit.  Several psych hospitalizations.  She is failed multiple other psychiatric medications which is why she is on clozapine.  She is also failed or relapsed with dosage reduction of clozapine or if she missed clozapine.  She is consistent with her dosage now.  Past Psychiatric Medication Trials: prior antipsychotic trials Clozapine 400 Clonazepam Lithium 600 Citrucil, Miralax, stool softener, Mg tablets  Sister has schizophrenia.  Review of Systems:  Review of Systems  Cardiovascular:  Negative for palpitations.  Gastrointestinal:  Positive for abdominal distention, constipation and diarrhea. Negative for abdominal pain.  Genitourinary:  Positive for enuresis.  Musculoskeletal:  Positive for back pain, neck pain and  neck stiffness.  Neurological:  Negative for tremors.       Chronic balance issues  Psychiatric/Behavioral:  Positive for decreased concentration, dysphoric mood and sleep disturbance. Negative for agitation, behavioral problems, confusion, hallucinations, self-injury and suicidal ideas. The patient is not nervous/anxious and is not hyperactive.   Constipation comes and goes  Medications: I have reviewed the patient's current medications.  Current Outpatient Medications  Medication Sig Dispense Refill   Ascorbic Acid (VITAMIN C) 500 MG CHEW Chew by mouth.     Calcium Carbonate-Vit D-Min (CALCIUM 1200 PO) Take by mouth.     famotidine (PEPCID) 40 MG tablet TAKE 1 TABLET(40 MG) BY MOUTH DAILY 90 tablet 1   Magnesium 400 MG CAPS Take by mouth.     metoprolol tartrate (LOPRESSOR) 25 MG tablet Take 0.5 tablets (12.5 mg total) by mouth 2 (two) times daily. 90 tablet 1   mupirocin ointment (BACTROBAN) 2 % Apply 1 Application topically  daily. With dressing changes 22 g 0   TRULANCE 3 MG TABS TAKE 1 TABLET BY MOUTH DAILY 90 tablet 3   Vitamin D, Ergocalciferol, (DRISDOL) 1.25 MG (50000 UNIT) CAPS capsule TAKE 1 CAPSULE BY MOUTH EVERY 7 DAYS 12 capsule 1   zinc gluconate 50 MG tablet Take 50 mg by mouth daily.     clonazePAM (KLONOPIN) 1 MG tablet Take 0.5 tablets (0.5 mg total) by mouth in the morning, at noon, and at bedtime. 45 tablet 5   cloZAPine (CLOZARIL) 100 MG tablet 4 tabs HS 120 tablet 6   lithium 300 MG tablet TAKE 2 TABLETS(600 MG) BY MOUTH AT BEDTIME 180 tablet 1   No current facility-administered medications for this visit.    Medication Side Effects: Other: alternating contstipation and diarrhea but has IBS  Allergies:  Allergies  Allergen Reactions   Levsin [Hyoscyamine Sulfate] Hives   Escitalopram Hives    Past Medical History:  Diagnosis Date   Abnormal perimenopausal bleeding    Bowel incontinence    Chronic constipation    Dermatitis    Dysplastic nevus 08/26/2022   L pinna ear, Moderate to Severe atypia, Excised 09/24/2022   High risk medication use    Hyperglycemia    Hypertriglyceridemia    Iron deficiency anemia due to chronic blood loss    secondary to heavy flow   Metallic taste    Schizo-affective psychosis ([place])    Schizoaffective disorder, bipolar type ([place])    Dr. Tyrone Sage    Family History  Problem Relation Age of Onset   Mental illness Sister        Schizophrenia and bipolar   Cancer Maternal Grandmother        Colon   Mental illness Paternal Grandmother        Schizophrenia   Breast cancer Neg Hx     Social History   Socioeconomic History   Marital status: Married    Spouse name: [name]   Number of children: 2   Years of education: Not on file   Highest education level: Bachelor's degree (e.g., BA, AB, BS)  Occupational History   Not on file  Tobacco Use   Smoking status: Never   Smokeless tobacco: Never  Vaping Use   Vaping Use: Never used   Substance and Sexual Activity   Alcohol use: No    Alcohol/week: 0.0 standard drinks of alcohol   Drug use: No   Sexual activity: Not Currently    Partners: Male  Other Topics Concern   Not on file  Social History Narrative   Not on file   Social Determinants of Health   Financial Resource Strain: Low Risk  (02/20/2022)   Overall Financial Resource Strain (CARDIA)    Difficulty of Paying Living Expenses: Not hard at all  Food Insecurity: No Food Insecurity (02/20/2022)   Hunger Vital Sign    Worried About Running Out of Food in the Last Year: Never true    Ran Out of Food in the Last Year: Never true  Transportation Needs: No Transportation Needs (02/20/2022)   PRAPARE - Hydrologist (Medical): No    Lack of Transportation (Non-Medical): No  Physical Activity: Sufficiently Active (02/20/2022)   Exercise Vital Sign    Days of Exercise per Week: 6 days    Minutes of Exercise per Session: 60 min  Stress: Stress Concern Present (02/20/2022)   [place]    Feeling of Stress : To some extent  Social Connections: Socially Integrated (02/20/2022)   Social Connection and Isolation Panel [NHANES]    Frequency of Communication with Friends and Family: Three times a week    Frequency of Social Gatherings with Friends and Family: Three times a week    Attends Religious Services: More than 4 times per year    Active Member of Clubs or Organizations: Yes    Attends Archivist Meetings: More than 4 times per year    Marital Status: Married  Human resources officer Violence: Not At Risk (02/20/2022)   Humiliation, Afraid, Rape, and Kick questionnaire    Fear of Current or Ex-Partner: No    Emotionally Abused: No    Physically Abused: No    Sexually Abused: No    Past Medical History, Surgical history, Social history, and Family history were reviewed and updated as appropriate.   Please see  review of systems for further details on the patient's review from today.   Objective:   Physical Exam:  Wt 141 lb (64 kg)   LMP 12/16/2016 (Approximate)   BMI 24.20 kg/m   Physical Exam Constitutional:      General: She is not in acute distress.    Appearance: She is well-developed.  Musculoskeletal:        General: No deformity.  Neurological:     Mental Status: She is alert and oriented to person, place, and time.     Cranial Nerves: No dysarthria.     Coordination: Coordination normal.  Psychiatric:        Attention and Perception: Perception normal. She is inattentive. She does not perceive auditory or visual hallucinations.        Mood and Affect: Mood is anxious. Mood is not depressed. Affect is not labile, blunt, angry, tearful or inappropriate.        Speech: Speech is not rapid and pressured, slurred or tangential.        Behavior: Behavior normal. Behavior is cooperative.        Thought Content: Thought content is not paranoid or delusional. Thought content does not include homicidal or suicidal ideation. Thought content does not include suicidal plan.        Cognition and Memory: Cognition is impaired. She exhibits impaired recent memory.     Comments: Insight fair to poor & judgment fair. Scattered thought chronically and talkative and  overinclusiveness but not as severe as in the past.  Difficult to finish session on time.  Not as fidgety as in the past.  Affect  more relaxed. Not irritable Chronic stooped posture.       Lab Review:     Component Value Date/Time   NA 137 11/09/2021 1621   NA 140 01/18/2016 1418   NA 139 08/27/2013 0930   K 4.2 11/09/2021 1621   K 4.2 08/27/2013 0930   CL 101 11/09/2021 1621   CL 108 (Carla Thompson) 08/27/2013 0930   CO2 30 11/09/2021 1621   CO2 28 08/27/2013 0930   GLUCOSE 94 11/09/2021 1621   GLUCOSE 98 08/27/2013 0930   BUN 13 11/09/2021 1621   BUN 17 01/18/2016 1418   BUN 11 08/27/2013 0930   CREATININE 0.91 11/09/2021 1621    CALCIUM 9.3 11/09/2021 1621   CALCIUM 8.5 08/27/2013 0930   PROT 6.4 11/09/2021 1621   PROT 6.6 01/18/2016 1418   PROT 7.2 08/27/2013 0930   ALBUMIN 4.3 09/11/2020 2000   ALBUMIN 4.4 01/18/2016 1418   ALBUMIN 3.5 08/27/2013 0930   AST 14 11/09/2021 1621   AST 20 08/27/2013 0930   ALT 9 11/09/2021 1621   ALT 19 08/27/2013 0930   ALKPHOS 95 09/11/2020 2000   ALKPHOS 107 08/27/2013 0930   BILITOT 0.3 11/09/2021 1621   BILITOT <0.2 01/18/2016 1418   BILITOT 0.3 08/27/2013 0930   GFRNONAA >60 09/11/2020 2000   GFRNONAA 63 07/11/2020 1558   GFRAA >60 08/31/2020 1633   GFRAA 73 07/11/2020 1558       Component Value Date/Time   WBC 8.4 12/30/2022 1148   RBC 4.13 12/30/2022 1148   HGB 12.0 12/30/2022 1148   HGB 12.3 01/23/2016 1231   HCT 39.4 12/30/2022 1148   HCT 38.6 01/23/2016 1231   PLT 227 12/30/2022 1148   PLT 280 01/23/2016 1231   MCV 95.4 12/30/2022 1148   MCV 95 01/23/2016 1231   MCV 92 03/24/2015 1244   MCH 29.1 12/30/2022 1148   MCHC 30.5 12/30/2022 1148   RDW 12.5 12/30/2022 1148   RDW 13.7 01/23/2016 1231   RDW 13.1 03/24/2015 1244   LYMPHSABS 1.9 12/30/2022 1148   LYMPHSABS 2.4 01/23/2016 1231   LYMPHSABS 2.2 03/24/2015 1244   MONOABS 0.5 12/30/2022 1148   MONOABS 0.4 03/24/2015 1244   EOSABS 0.3 12/30/2022 1148   EOSABS 0.1 01/23/2016 1231   EOSABS 0.4 03/24/2015 1244   BASOSABS 0.1 12/30/2022 1148   BASOSABS 0.0 01/23/2016 1231   BASOSABS 0.0 03/24/2015 1244    Lithium Lvl  Date Value Ref Range Status  09/12/2020 0.46 (L) 0.60 - 1.20 mmol/L Final    Comment:    Performed at [facility], [address] [place], [place] [address]     Lab Results  Component Value Date   VALPROATE 102 (Carla Thompson) 12/26/2015     .res Assessment: Plan:    Schizoaffective disorder, bipolar type ([place]) - Plan: Lithium level, Basic metabolic panel, TSH, cloZAPine (CLOZARIL) 100 MG tablet, lithium 300 MG tablet  Long term current use of clozapine  Panic  disorder with agoraphobia - Plan: clonazePAM (KLONOPIN) 1 MG tablet  Chronic constipation  Lithium use - Plan: Lithium level, Basic metabolic panel, TSH   Overall her schizoaffective disorder was under control.  Until she ran out of clozapine bc of difficulty getting it last summer/fall 2021.  This led to more disorganziation, confusion, anxiety and rumination.  Insight is not good.  Not self aware about overinclusiveness. All of this is better now with better clozapine consistency and exercise.  Discussed the importance of consistent with the clozapine.  Disc  severity of sx she had when inadequate dose of clozapine.  If they have difficulty obtaining it in the future please contact us right away.  She missed an off of the clozapine to cause disorganization and psychotic symptoms as well as anxiety.  In past when resumed took many weeks for psychosis to resolve.  Answered questions about clozapine and alternatives.  This has been much more effective and will not cause TD.  No comparable alternative. Continue clozapine 400 mg HS  (She prefers all at once at night)  Panic disorder controlled. Consider reduction bc balance but history poor tolerance and coping with panic. clonazepam 0.75 mg BID  Need repeat lithium level. Contnue lithium 600 mg HS  Counseled patient regarding potential benefits, risks, and side effects of lithium to include potential risk of lithium affecting thyroid and renal function.  Discussed need for periodic lab monitoring to determine drug level and to assess for potential adverse effects.  Counseled patient regarding signs and symptoms of lithium toxicity and advised that they notify office immediately or seek urgent medical attention if experiencing these signs and symptoms.  Patient advised to contact office with any questions or concerns. Disc DDI with NSAIDs.  Need lithium level if takes regularly.  Fall precautions around sedative medications. Disc PT for head  forward posture which she did some.  Emphasized this as means to help reduce fall risk.  This was emphasized again.  ANC stable . Continue clozapine.   Continue lab test per usual.    Vitamin D in winter.  GI managing constipation better.  Exercise helps constipation.  She is walking 6 days per week. Answered questions about probiotics.  Option modafinil for alertness is safer than alternative of lower dose.  Carla Thompson doesn't want her to take more meds.  Disc SE each med at her request including metoprolol which is at a low dose.    No med changes:  Continue clozapine 400 mg HS  (She prefers all at once at night).  Risky to reduce bc took months to get better when she was psychotic before.  Rec not reduce. Lithium 600 mg HS clonazepam 0.5 mg TID  Check lithium level & labs.  She wants to consider reduction in clozapine DT constipation.  Will pull old chart to review but risky to reduce so avoid if possible..  This appt was 30 mins.  FU 6 mos  Lynder Parents, MD, DFAPA   Please see After Visit Summary for patient specific instructions.  Future Appointments  Date Time Provider [place]  01/08/2023  4:15 PM Ralene Bathe, MD [facility] None  05/12/2023  3:00 PM Steele Sizer, MD [facility]    Orders Placed This Encounter  Procedures   Lithium level   Basic metabolic panel   TSH       -------------------------------

## 2023-01-08 ENCOUNTER — Ambulatory Visit (INDEPENDENT_AMBULATORY_CARE_PROVIDER_SITE_OTHER): Payer: 59 | Admitting: Dermatology

## 2023-01-08 VITALS — BP 121/80 | HR 87

## 2023-01-08 DIAGNOSIS — D225 Melanocytic nevi of trunk: Secondary | ICD-10-CM

## 2023-01-08 DIAGNOSIS — L814 Other melanin hyperpigmentation: Secondary | ICD-10-CM | POA: Diagnosis not present

## 2023-01-08 DIAGNOSIS — Z86018 Personal history of other benign neoplasm: Secondary | ICD-10-CM | POA: Diagnosis not present

## 2023-01-08 DIAGNOSIS — D229 Melanocytic nevi, unspecified: Secondary | ICD-10-CM

## 2023-01-08 DIAGNOSIS — Z1283 Encounter for screening for malignant neoplasm of skin: Secondary | ICD-10-CM | POA: Diagnosis not present

## 2023-01-08 DIAGNOSIS — L578 Other skin changes due to chronic exposure to nonionizing radiation: Secondary | ICD-10-CM | POA: Diagnosis not present

## 2023-01-08 DIAGNOSIS — L821 Other seborrheic keratosis: Secondary | ICD-10-CM

## 2023-01-08 NOTE — Patient Instructions (Addendum)
Seborrheic Keratosis  What causes seborrheic keratoses? Seborrheic keratoses are harmless, common skin growths that first appear during adult life.  As time goes by, more growths appear.  Some people may develop a large number of them.  Seborrheic keratoses appear on both covered and uncovered body parts.  They are not caused by sunlight.  The tendency to develop seborrheic keratoses can be inherited.  They vary in color from skin-colored to gray, brown, or even black.  They can be either smooth or have a rough, warty surface.   Seborrheic keratoses are superficial and look as if they were stuck on the skin.  Under the microscope this type of keratosis looks like layers upon layers of skin.  That is why at times the top layer may seem to fall off, but the rest of the growth remains and re-grows.    Treatment Seborrheic keratoses do not need to be treated, but can easily be removed in the office.  Seborrheic keratoses often cause symptoms when they rub on clothing or jewelry.  Lesions can be in the way of shaving.  If they become inflamed, they can cause itching, soreness, or burning.  Removal of a seborrheic keratosis can be accomplished by freezing, burning, or surgery. If any spot bleeds, scabs, or grows rapidly, please return to have it checked, as these can be an indication of a skin cancer.   Due to recent changes in healthcare laws, you may see results of your pathology and/or laboratory studies on MyChart before the doctors have had a chance to review them. We understand that in some cases there may be results that are confusing or concerning to you. Please understand that not all results are received at the same time and often the doctors may need to interpret multiple results in order to provide you with the best plan of care or course of treatment. Therefore, we ask that you please give us 2 business days to thoroughly review all your results before contacting the office for clarification.  Should we see a critical lab result, you will be contacted sooner.   If You Need Anything After Your Visit  If you have any questions or concerns for your doctor, please call our main line at [phone] and press option 4 to reach your doctor's medical assistant. If no one answers, please leave a voicemail as directed and we will return your call as soon as possible. Messages left after 4 pm will be answered the following business day.   You may also send us a message via MyChart. We typically respond to MyChart messages within 1-2 business days.  For prescription refills, please ask your pharmacy to contact our office. Our fax number is [phone].  If you have an urgent issue when the clinic is closed that cannot wait until the next business day, you can page your doctor at the number below.    Please note that while we do our best to be available for urgent issues outside of office hours, we are not available 24/7.   If you have an urgent issue and are unable to reach us, you may choose to seek medical care at your doctor's office, retail clinic, urgent care center, or emergency room.  If you have a medical emergency, please immediately call 911 or go to the emergency department.  Pager Numbers  - Dr. Kowalski: [phone]  - Dr. Moye: [phone]  - Dr. Stewart: [phone]  In the event of inclement weather, please call our main line   at [phone] for an update on the status of any delays or closures.  Dermatology Medication Tips: Please keep the boxes that topical medications come in in order to help keep track of the instructions about where and how to use these. Pharmacies typically print the medication instructions only on the boxes and not directly on the medication tubes.   If your medication is too expensive, please contact our office at [phone] option 4 or send us a message through MyChart.   We are unable to tell what your co-pay for medications will be  in advance as this is different depending on your insurance coverage. However, we may be able to find a substitute medication at lower cost or fill out paperwork to get insurance to cover a needed medication.   If a prior authorization is required to get your medication covered by your insurance company, please allow us 1-2 business days to complete this process.  Drug prices often vary depending on where the prescription is filled and some pharmacies may offer cheaper prices.  The website www.goodrx.com contains coupons for medications through different pharmacies. The prices here do not account for what the cost may be with help from insurance (it may be cheaper with your insurance), but the website can give you the price if you did not use any insurance.  - You can print the associated coupon and take it with your prescription to the pharmacy.  - You may also stop by our office during regular business hours and pick up a GoodRx coupon card.  - If you need your prescription sent electronically to a different pharmacy, notify our office through [facility] MyChart or by phone at [phone] option 4.     Si Usted Necesita Algo Despus de Su Visita  Tambin puede enviarnos un mensaje a travs de MyChart. Por lo general respondemos a los mensajes de MyChart en el transcurso de 1 a 2 das hbiles.  Para renovar recetas, por favor pida a su farmacia que se ponga en contacto con nuestra oficina. Nuestro nmero de fax es el [phone].  Si tiene un asunto urgente cuando la clnica est cerrada y que no puede esperar hasta el siguiente da hbil, puede llamar/localizar a su doctor(a) al nmero que aparece a continuacin.   Por favor, tenga en cuenta que aunque hacemos todo lo posible para estar disponibles para asuntos urgentes fuera del horario de oficina, no estamos disponibles las 24 horas del da, los 7 das de la semana.   Si tiene un problema urgente y no puede comunicarse con nosotros,  puede optar por buscar atencin mdica  en el consultorio de su doctor(a), en una clnica privada, en un centro de atencin urgente o en una sala de emergencias.  Si tiene una emergencia mdica, por favor llame inmediatamente al 911 o vaya a la sala de emergencias.  Nmeros de bper  - Dr. Kowalski: [phone]  - Dra. Moye: [phone]  - Dra. Stewart: [phone]  En caso de inclemencias del tiempo, por favor llame a nuestra lnea principal al [phone] para una actualizacin sobre el estado de cualquier retraso o cierre.  Consejos para la medicacin en dermatologa: Por favor, guarde las cajas en las que vienen los medicamentos de uso tpico para ayudarle a seguir las instrucciones sobre dnde y cmo usarlos. Las farmacias generalmente imprimen las instrucciones del medicamento slo en las cajas y no directamente en los tubos del medicamento.   Si su medicamento es muy caro, por favor, pngase en contacto   con nuestra oficina llamando al [phone] y presione la opcin 4 o envenos un mensaje a travs de MyChart.   No podemos decirle cul ser su copago por los medicamentos por adelantado ya que esto es diferente dependiendo de la cobertura de su seguro. Sin embargo, es posible que podamos encontrar un medicamento sustituto a menor costo o llenar un formulario para que el seguro cubra el medicamento que se considera necesario.   Si se requiere una autorizacin previa para que su compaa de seguros cubra su medicamento, por favor permtanos de 1 a 2 das hbiles para completar este proceso.  Los precios de los medicamentos varan con frecuencia dependiendo del lugar de dnde se surte la receta y alguna farmacias pueden ofrecer precios ms baratos.  El sitio web www.goodrx.com tiene cupones para medicamentos de diferentes farmacias. Los precios aqu no tienen en cuenta lo que podra costar con la ayuda del seguro (puede ser ms barato con su seguro), pero el sitio web puede darle el  precio si no utiliz ningn seguro.  - Puede imprimir el cupn correspondiente y llevarlo con su receta a la farmacia.  - Tambin puede pasar por nuestra oficina durante el horario de atencin regular y recoger una tarjeta de cupones de GoodRx.  - Si necesita que su receta se enve electrnicamente a una farmacia diferente, informe a nuestra oficina a travs de MyChart de [place] o por telfono llamando al [phone] y presione la opcin 4.  

## 2023-01-08 NOTE — Progress Notes (Unsigned)
   Follow-Up Visit   Subjective  Carla Thompson is a 62 y.o. female who presents for the following: Total body skin exam (Hx of Dysplastic Nevus L pinna ear) and Nevus (R breast). The patient presents for Total-Body Skin Exam (TBSE) for skin cancer screening and mole check.  The patient has spots, moles and lesions to be evaluated, some may be new or changing and the patient has concerns that these could be cancer.   The following portions of the chart were reviewed this encounter and updated as appropriate:   Tobacco  Allergies  Meds  Problems  Med Hx  Surg Hx  Fam Hx     Review of Systems:  No other skin or systemic complaints except as noted in HPI or Assessment and Plan.  Objective  Well appearing patient in no apparent distress; mood and affect are within normal limits.  A full examination was performed including scalp, head, eyes, ears, nose, lips, neck, chest, axillae, abdomen, back, buttocks, bilateral upper extremities, bilateral lower extremities, hands, feet, fingers, toes, fingernails, and toenails. All findings within normal limits unless otherwise noted below.  L pinna ear Scar with no evidence of recurrence.    Assessment & Plan   Lentigines - Scattered tan macules - Due to sun exposure - Benign-appearing, observe - Recommend daily broad spectrum sunscreen SPF 30+ to sun-exposed areas, reapply every 2 hours as needed. - Call for any changes - upper back  Seborrheic Keratoses - Stuck-on, waxy, tan-brown papules and/or plaques  - Benign-appearing - Discussed benign etiology and prognosis. - Observe - Call for any changes - back  Melanocytic Nevi - Tan-brown and/or pink-flesh-colored symmetric macules and papules - Benign appearing on exam today - Observation - Call clinic for new or changing moles - Recommend daily use of broad spectrum spf 30+ sunscreen to sun-exposed areas.  - trunk  Hemangiomas - Red papules - Discussed benign nature -  Observe - Call for any changes - trunk  Actinic Damage - Chronic condition, secondary to cumulative UV/sun exposure - diffuse scaly erythematous macules with underlying dyspigmentation - Recommend daily broad spectrum sunscreen SPF 30+ to sun-exposed areas, reapply every 2 hours as needed.  - Staying in the shade or wearing long sleeves, sun glasses (UVA+UVB protection) and wide brim hats (4-inch brim around the entire circumference of the hat) are also recommended for sun protection.  - Call for new or changing lesions.  Skin cancer screening performed today.   History of dysplastic nevus L pinna ear Clear. Observe for recurrence. Call clinic for new or changing lesions.  Recommend regular skin exams, daily broad-spectrum spf 30+ sunscreen use, and photoprotection.    Return in about 1 year (around 01/09/2024) for TBSE, Hx of Dysplastic nevi.  I, Othelia Pulling, RMA, am acting as scribe for Sarina Ser, MD . Documentation: I have reviewed the above documentation for accuracy and completeness, and I agree with the above.  Sarina Ser, MD

## 2023-01-09 ENCOUNTER — Encounter: Payer: Self-pay | Admitting: Dermatology

## 2023-01-14 ENCOUNTER — Ambulatory Visit: Payer: 59 | Admitting: Psychiatry

## 2023-01-31 ENCOUNTER — Other Ambulatory Visit
Admission: RE | Admit: 2023-01-31 | Discharge: 2023-01-31 | Disposition: A | Discharge status: Home / Self Care | Payer: 59 | Source: Ambulatory Visit | Attending: Psychiatry | Admitting: Psychiatry

## 2023-01-31 DIAGNOSIS — F250 Schizoaffective disorder, bipolar type: Secondary | ICD-10-CM | POA: Insufficient documentation

## 2023-01-31 DIAGNOSIS — Z79899 Other long term (current) drug therapy: Secondary | ICD-10-CM | POA: Insufficient documentation

## 2023-01-31 LAB — CBC WITH DIFFERENTIAL/PLATELET
Abs Immature Granulocytes: 0.03 10*3/uL (ref 0.00–0.07)
Basophils Absolute: 0.1 10*3/uL (ref 0.0–0.1)
Basophils Relative: 1 %
Eosinophils Absolute: 0.2 10*3/uL (ref 0.0–0.5)
Eosinophils Relative: 3 %
HCT: 38.8 % (ref 36.0–46.0)
Hemoglobin: 12.3 g/dL (ref 12.0–15.0)
Immature Granulocytes: 0 %
Lymphocytes Relative: 24 %
Lymphs Abs: 1.8 10*3/uL (ref 0.7–4.0)
MCH: 30.7 pg (ref 26.0–34.0)
MCHC: 31.7 g/dL (ref 30.0–36.0)
MCV: 96.8 fL (ref 80.0–100.0)
Monocytes Absolute: 0.4 10*3/uL (ref 0.1–1.0)
Monocytes Relative: 5 %
Neutro Abs: 5.1 10*3/uL (ref 1.7–7.7)
Neutrophils Relative %: 67 %
Platelets: 232 10*3/uL (ref 150–400)
RBC: 4.01 MIL/uL (ref 3.87–5.11)
RDW: 12.3 % (ref 11.5–15.5)
WBC: 7.6 10*3/uL (ref 4.0–10.5)
nRBC: 0 % (ref 0.0–0.2)

## 2023-02-10 ENCOUNTER — Ambulatory Visit: Payer: 59 | Admitting: Family Medicine

## 2023-02-21 ENCOUNTER — Other Ambulatory Visit: Payer: Self-pay | Admitting: Family Medicine

## 2023-02-21 DIAGNOSIS — E559 Vitamin D deficiency, unspecified: Secondary | ICD-10-CM

## 2023-02-24 ENCOUNTER — Other Ambulatory Visit
Admission: RE | Admit: 2023-02-24 | Discharge: 2023-02-24 | Disposition: A | Discharge status: Home / Self Care | Payer: 59 | Attending: Psychiatry | Admitting: Psychiatry

## 2023-02-24 DIAGNOSIS — F250 Schizoaffective disorder, bipolar type: Secondary | ICD-10-CM | POA: Diagnosis not present

## 2023-02-24 DIAGNOSIS — Z79899 Other long term (current) drug therapy: Secondary | ICD-10-CM

## 2023-02-24 LAB — CBC WITH DIFFERENTIAL/PLATELET
Abs Immature Granulocytes: 0.02 10*3/uL (ref 0.00–0.07)
Basophils Absolute: 0.1 10*3/uL (ref 0.0–0.1)
Basophils Relative: 1 %
Eosinophils Absolute: 0.3 10*3/uL (ref 0.0–0.5)
Eosinophils Relative: 3 %
HCT: 37.3 % (ref 36.0–46.0)
Hemoglobin: 11.8 g/dL — ABNORMAL LOW (ref 12.0–15.0)
Immature Granulocytes: 0 %
Lymphocytes Relative: 28 %
Lymphs Abs: 2.4 10*3/uL (ref 0.7–4.0)
MCH: 30.3 pg (ref 26.0–34.0)
MCHC: 31.6 g/dL (ref 30.0–36.0)
MCV: 95.6 fL (ref 80.0–100.0)
Monocytes Absolute: 0.5 10*3/uL (ref 0.1–1.0)
Monocytes Relative: 6 %
Neutro Abs: 5.1 10*3/uL (ref 1.7–7.7)
Neutrophils Relative %: 62 %
Platelets: 235 10*3/uL (ref 150–400)
RBC: 3.9 MIL/uL (ref 3.87–5.11)
RDW: 12.7 % (ref 11.5–15.5)
WBC: 8.4 10*3/uL (ref 4.0–10.5)
nRBC: 0 % (ref 0.0–0.2)

## 2023-02-24 NOTE — Telephone Encounter (Signed)
Pt called asking about her vitamin d prescription and if it is going to be filled.  CB@  [phone]

## 2023-03-06 NOTE — Telephone Encounter (Signed)
Pt is calling back to check on the status of her medication refill. Vitamin D, Ergocalciferol, (DRISDOL) 1.25 MG (50000 UNIT) CAPS capsule  Pt states that she is out of medication.  Please advise.   [facility] [id number], [place] [address] Phone: [phone]  Fax: [phone]

## 2023-03-12 ENCOUNTER — Other Ambulatory Visit: Payer: Self-pay | Admitting: Family Medicine

## 2023-03-12 ENCOUNTER — Other Ambulatory Visit: Payer: Self-pay | Admitting: Psychiatry

## 2023-03-12 DIAGNOSIS — K219 Gastro-esophageal reflux disease without esophagitis: Secondary | ICD-10-CM

## 2023-03-12 DIAGNOSIS — F4001 Agoraphobia with panic disorder: Secondary | ICD-10-CM

## 2023-03-12 DIAGNOSIS — E559 Vitamin D deficiency, unspecified: Secondary | ICD-10-CM

## 2023-03-24 ENCOUNTER — Telehealth: Payer: Self-pay

## 2023-03-24 ENCOUNTER — Other Ambulatory Visit: Payer: Self-pay | Admitting: Psychiatry

## 2023-03-24 ENCOUNTER — Other Ambulatory Visit
Admission: RE | Admit: 2023-03-24 | Discharge: 2023-03-24 | Disposition: A | Discharge status: Home / Self Care | Payer: 59 | Attending: Psychiatry | Admitting: Psychiatry

## 2023-03-24 DIAGNOSIS — Z79899 Other long term (current) drug therapy: Secondary | ICD-10-CM | POA: Insufficient documentation

## 2023-03-24 DIAGNOSIS — F250 Schizoaffective disorder, bipolar type: Secondary | ICD-10-CM | POA: Diagnosis present

## 2023-03-24 LAB — CBC WITH DIFFERENTIAL/PLATELET
Abs Immature Granulocytes: 0.03 10*3/uL (ref 0.00–0.07)
Basophils Absolute: 0 10*3/uL (ref 0.0–0.1)
Basophils Relative: 0 %
Eosinophils Absolute: 0.1 10*3/uL (ref 0.0–0.5)
Eosinophils Relative: 1 %
HCT: 37.5 % (ref 36.0–46.0)
Hemoglobin: 12 g/dL (ref 12.0–15.0)
Immature Granulocytes: 0 %
Lymphocytes Relative: 21 %
Lymphs Abs: 1.7 10*3/uL (ref 0.7–4.0)
MCH: 30.5 pg (ref 26.0–34.0)
MCHC: 32 g/dL (ref 30.0–36.0)
MCV: 95.4 fL (ref 80.0–100.0)
Monocytes Absolute: 0.4 10*3/uL (ref 0.1–1.0)
Monocytes Relative: 5 %
Neutro Abs: 6.1 10*3/uL (ref 1.7–7.7)
Neutrophils Relative %: 73 %
Platelets: 219 10*3/uL (ref 150–400)
RBC: 3.93 MIL/uL (ref 3.87–5.11)
RDW: 12.6 % (ref 11.5–15.5)
WBC: 8.4 10*3/uL (ref 4.0–10.5)
nRBC: 0 % (ref 0.0–0.2)

## 2023-03-24 NOTE — Telephone Encounter (Signed)
Patient notified

## 2023-03-24 NOTE — Telephone Encounter (Signed)
sent 

## 2023-03-31 ENCOUNTER — Telehealth: Payer: Self-pay | Admitting: Gastroenterology

## 2023-03-31 NOTE — Telephone Encounter (Signed)
Pt left message to discuss refills on her trulance please return call

## 2023-04-09 ENCOUNTER — Other Ambulatory Visit: Payer: Self-pay

## 2023-04-09 MED ORDER — TRULANCE 3 MG PO TABS: 1.0000 | ORAL_TABLET | Freq: Every day | ORAL | 3 refills | Status: DC

## 2023-04-09 NOTE — Telephone Encounter (Signed)
Called patient again and she did not answer my call nor was I able to leave her a voicemail. I will send a refill on her Trulance.

## 2023-04-09 NOTE — Addendum Note (Signed)
Addended by: Adela Ports on: 04/09/2023 11:17 AM   Modules accepted: Orders

## 2023-04-23 ENCOUNTER — Other Ambulatory Visit
Admission: RE | Admit: 2023-04-23 | Discharge: 2023-04-23 | Disposition: A | Discharge status: Home / Self Care | Payer: 59 | Source: Ambulatory Visit | Attending: Psychiatry | Admitting: Psychiatry

## 2023-04-23 DIAGNOSIS — F209 Schizophrenia, unspecified: Secondary | ICD-10-CM | POA: Diagnosis present

## 2023-04-23 DIAGNOSIS — Z79899 Other long term (current) drug therapy: Secondary | ICD-10-CM | POA: Insufficient documentation

## 2023-04-23 LAB — CBC WITH DIFFERENTIAL/PLATELET
Abs Immature Granulocytes: 0.03 10*3/uL (ref 0.00–0.07)
Basophils Absolute: 0.1 10*3/uL (ref 0.0–0.1)
Basophils Relative: 1 %
Eosinophils Absolute: 0.2 10*3/uL (ref 0.0–0.5)
Eosinophils Relative: 2 %
HCT: 36.8 % (ref 36.0–46.0)
Hemoglobin: 11.6 g/dL — ABNORMAL LOW (ref 12.0–15.0)
Immature Granulocytes: 0 %
Lymphocytes Relative: 31 %
Lymphs Abs: 2.4 10*3/uL (ref 0.7–4.0)
MCH: 30.3 pg (ref 26.0–34.0)
MCHC: 31.5 g/dL (ref 30.0–36.0)
MCV: 96.1 fL (ref 80.0–100.0)
Monocytes Absolute: 0.5 10*3/uL (ref 0.1–1.0)
Monocytes Relative: 6 %
Neutro Abs: 4.7 10*3/uL (ref 1.7–7.7)
Neutrophils Relative %: 60 %
Platelets: 218 10*3/uL (ref 150–400)
RBC: 3.83 MIL/uL — ABNORMAL LOW (ref 3.87–5.11)
RDW: 12.7 % (ref 11.5–15.5)
WBC: 7.8 10*3/uL (ref 4.0–10.5)
nRBC: 0 % (ref 0.0–0.2)

## 2023-04-25 ENCOUNTER — Telehealth: Payer: Self-pay | Admitting: Psychiatry

## 2023-04-25 NOTE — Telephone Encounter (Signed)
PSF done

## 2023-04-25 NOTE — Telephone Encounter (Signed)
I will check [facility]S

## 2023-04-25 NOTE — Telephone Encounter (Signed)
Carla Thompson stating she checked with [facility] and they haven't received her lab work yet. She stated this is needed so she can get her Clozapine.  Appointment scheduled for 07/14/23 Contact # [phone]

## 2023-04-25 NOTE — Telephone Encounter (Signed)
Labs are in Epic

## 2023-05-09 NOTE — Progress Notes (Signed)
Name: Carla Thompson   MRN: [id number]    DOB: September 07, 1961   Date:05/09/2023       Progress Note  Subjective  Chief Complaint  Follow Up  HPI  Osteopenia: FRAX score was high but not enough to start on medication for osteoporosis. Discussed importance of increasing physical activity, high calcium and taking vitamin D daily   FRAX* RESULTS:  (version: 3.5) 10-year Probability of Fracture1 Major Osteoporotic Fracture2 Hip Fracture 17.1% 2.6% Population: [place] (Caucasian) Risk Factors: History of Fracture (Adult)  Neck weakness: she has been doing home exercises and is doing better.    GERD:  she is now taking Famotidine every day and seems to be helping with symptoms, very seldom has nausea but per Dr. Jennelle Human secondary to psychiatric medication. Unchanged   Chronic constipation: she is taking Trulance daily and when she feels constipated takes Lactulose prn.Under the care of Dr. Tobi Bastos ,no longer having soiling. She states harder when she travels    Schizophrenia: Seeing psychiatrist, Dr. Jennelle Human  . No longer has hallucinations. She has been cooking and cleaning again, she also likes to read .  No paranoia or hallucinations. She does not drive because of fear or getting in an accident secondary to psychiatric medications. She has two grandsons now  from her daughter Martie Lee. Stable    Tachycardia: doing well on Lopressor half pill twice daily, she denies SOB or chest pain, denies palpitation. Unchanged   History of dysplastic nevus: under the care of Dr. Gwen Pounds and is going back for 3 months full body scan soon  Low back pain/deconditioning: she states continues to have low back pain, she had PT in the past, she is still doing home exercises but does not want to go back to PT at this time. Discussed stretching    Hypertriglyceridemia: on life style modification only. Eating oatmeal three days a week, reviewed last labs with patient    The 10-year ASCVD risk score (Arnett DK, et al.,  2019) is: 2.6%   Values used to calculate the score:     Age: 62 years     Sex: Female     Is Non-Hispanic African American: No     Diabetic: No     Tobacco smoker: No     Systolic Blood Pressure: 112 mmHg     Is BP treated: No     HDL Cholesterol: 63 mg/dL     Total Cholesterol: 235 mg/dL    Patient Active Problem List   Diagnosis Date Noted   Vitamin D deficiency 05/15/2022   Hyperlipidemia 11/20/2017   GERD without esophagitis 02/21/2017   Tachycardia 02/20/2016   RLS (restless legs syndrome) 12/21/2015   Irritable bowel syndrome with constipation 12/21/2015   Other long term (current) drug therapy 12/08/2015   Chronic constipation 12/08/2015   Insomnia, persistent 12/08/2015   Dermatitis, eczematoid 12/08/2015   Gravida 2 para 2 12/08/2015   Hypertriglyceridemia 12/08/2015   Metallic taste 12/08/2015   Female climacteric state 12/08/2015   Schizoaffective disorder, bipolar type (HCC) 12/08/2015    Past Surgical History:  Procedure Laterality Date   ANUS SURGERY  2000   [place]   COLONOSCOPY     COLONOSCOPY WITH PROPOFOL N/A 11/30/2020   Procedure: COLONOSCOPY WITH PROPOFOL;  Surgeon: Wyline Mood, MD;  Location: [facility] ENDOSCOPY;  Service: Gastroenterology;  Laterality: N/A;   TUBAL LIGATION  12/02/1994    Family History  Problem Relation Age of Onset   Mental illness Sister  Schizophrenia and bipolar   Cancer Maternal Grandmother        Colon   Mental illness Paternal Grandmother        Schizophrenia   Breast cancer Neg Hx     Social History   Tobacco Use   Smoking status: Never   Smokeless tobacco: Never  Substance Use Topics   Alcohol use: No    Alcohol/week: 0.0 standard drinks of alcohol     Current Outpatient Medications:    Plecanatide (TRULANCE) 3 MG TABS, Take 1 tablet (3 mg total) by mouth daily., Disp: 90 tablet, Rfl: 3   Ascorbic Acid (VITAMIN C) 500 MG CHEW, Chew by mouth., Disp: , Rfl:    Calcium Carbonate-Vit D-Min (CALCIUM  1200 PO), Take by mouth., Disp: , Rfl:    clonazePAM (KLONOPIN) 1 MG tablet, TAKE 1/2 TABLET( 0.5 MG TOTAL) BY MOUTH IN THE MORNING, AT NOON AND AT BEDTIME., Disp: 45 tablet, Rfl: 3   cloZAPine (CLOZARIL) 100 MG tablet, 4 tabs HS, Disp: 120 tablet, Rfl: 6   famotidine (PEPCID) 40 MG tablet, TAKE 1 TABLET(40 MG) BY MOUTH DAILY, Disp: 90 tablet, Rfl: 1   lithium 300 MG tablet, TAKE 2 TABLETS(600 MG) BY MOUTH AT BEDTIME, Disp: 180 tablet, Rfl: 1   Magnesium 400 MG CAPS, Take by mouth., Disp: , Rfl:    metoprolol tartrate (LOPRESSOR) 25 MG tablet, Take 0.5 tablets (12.5 mg total) by mouth 2 (two) times daily., Disp: 90 tablet, Rfl: 1   mupirocin ointment (BACTROBAN) 2 %, Apply 1 Application topically daily. With dressing changes, Disp: 22 g, Rfl: 0   Vitamin D, Ergocalciferol, (DRISDOL) 1.25 MG (50000 UNIT) CAPS capsule, TAKE 1 CAPSULE BY MOUTH EVERY 7 DAYS, Disp: 12 capsule, Rfl: 1   zinc gluconate 50 MG tablet, Take 50 mg by mouth daily., Disp: , Rfl:   Allergies  Allergen Reactions   Levsin [Hyoscyamine Sulfate] Hives   Escitalopram Hives    I personally reviewed active problem list, medication list, allergies, family history, social history, health maintenance with the patient/caregiver today.   ROS  ***  Objective  There were no vitals filed for this visit.  There is no height or weight on file to calculate BMI.  Physical Exam ***  Recent Results (from the past 2160 hour(s))  CBC with Differential/Platelet     Status: Abnormal   Collection Time: 02/24/23 11:28 AM  Result Value Ref Range   WBC 8.4 4.0 - 10.5 K/uL   RBC 3.90 3.87 - 5.11 MIL/uL   Hemoglobin 11.8 (L) 12.0 - 15.0 g/dL   HCT 78.2 95.6 - 21.3 %   MCV 95.6 80.0 - 100.0 fL   MCH 30.3 26.0 - 34.0 pg   MCHC 31.6 30.0 - 36.0 g/dL   RDW 08.6 57.8 - 46.9 %   Platelets 235 150 - 400 K/uL   nRBC 0.0 0.0 - 0.2 %   Neutrophils Relative % 62 %   Neutro Abs 5.1 1.7 - 7.7 K/uL   Lymphocytes Relative 28 %   Lymphs Abs  2.4 0.7 - 4.0 K/uL   Monocytes Relative 6 %   Monocytes Absolute 0.5 0.1 - 1.0 K/uL   Eosinophils Relative 3 %   Eosinophils Absolute 0.3 0.0 - 0.5 K/uL   Basophils Relative 1 %   Basophils Absolute 0.1 0.0 - 0.1 K/uL   Immature Granulocytes 0 %   Abs Immature Granulocytes 0.02 0.00 - 0.07 K/uL    Comment: Performed at [facility], [address] [place], [place] [address]  CBC with Differential/Platelet     Status: None   Collection Time: 03/24/23 12:05 PM  Result Value Ref Range   WBC 8.4 4.0 - 10.5 K/uL   RBC 3.93 3.87 - 5.11 MIL/uL   Hemoglobin 12.0 12.0 - 15.0 g/dL   HCT 32.2 02.5 - 42.7 %   MCV 95.4 80.0 - 100.0 fL   MCH 30.5 26.0 - 34.0 pg   MCHC 32.0 30.0 - 36.0 g/dL   RDW 06.2 37.6 - 28.3 %   Platelets 219 150 - 400 K/uL   nRBC 0.0 0.0 - 0.2 %   Neutrophils Relative % 73 %   Neutro Abs 6.1 1.7 - 7.7 K/uL   Lymphocytes Relative 21 %   Lymphs Abs 1.7 0.7 - 4.0 K/uL   Monocytes Relative 5 %   Monocytes Absolute 0.4 0.1 - 1.0 K/uL   Eosinophils Relative 1 %   Eosinophils Absolute 0.1 0.0 - 0.5 K/uL   Basophils Relative 0 %   Basophils Absolute 0.0 0.0 - 0.1 K/uL   Immature Granulocytes 0 %   Abs Immature Granulocytes 0.03 0.00 - 0.07 K/uL    Comment: Performed at [facility], [address] [place], [place] [address]  CBC with Differential/Platelet     Status: Abnormal   Collection Time: 04/23/23  1:12 PM  Result Value Ref Range   WBC 7.8 4.0 - 10.5 K/uL   RBC 3.83 (L) 3.87 - 5.11 MIL/uL   Hemoglobin 11.6 (L) 12.0 - 15.0 g/dL   HCT 16.0 73.7 - 10.6 %   MCV 96.1 80.0 - 100.0 fL   MCH 30.3 26.0 - 34.0 pg   MCHC 31.5 30.0 - 36.0 g/dL   RDW 26.9 48.5 - 46.2 %   Platelets 218 150 - 400 K/uL   nRBC 0.0 0.0 - 0.2 %   Neutrophils Relative % 60 %   Neutro Abs 4.7 1.7 - 7.7 K/uL   Lymphocytes Relative 31 %   Lymphs Abs 2.4 0.7 - 4.0 K/uL   Monocytes Relative 6 %   Monocytes Absolute 0.5 0.1 - 1.0 K/uL   Eosinophils Relative 2 %   Eosinophils  Absolute 0.2 0.0 - 0.5 K/uL   Basophils Relative 1 %   Basophils Absolute 0.1 0.0 - 0.1 K/uL   Immature Granulocytes 0 %   Abs Immature Granulocytes 0.03 0.00 - 0.07 K/uL    Comment: Performed at [facility], [address] [place], [place] [address]    PHQ2/9:    11/29/2022    3:43 PM 10/31/2022   10:09 AM 08/08/2022    2:17 PM 05/15/2022    3:32 PM 02/20/2022    3:14 PM  Depression screen PHQ 2/9  Decreased Interest 0 0 0 0 0  Down, Depressed, Hopeless 0 0 0 0 0  PHQ - 2 Score 0 0 0 0 0  Altered sleeping 0 0 0  0  Tired, decreased energy 0 0 0  0  Change in appetite 0 0 0  0  Feeling bad or failure about yourself  0 0 0  0  Trouble concentrating 0 0 0  0  Moving slowly or fidgety/restless 0 0 0  0  Suicidal thoughts 0 0 0  0  PHQ-9 Score 0 0 0  0  Difficult doing work/chores Not difficult at all Not difficult at all       phq 9 is {gen pos [id number]}   Fall Risk:  11/29/2022    3:39 PM 10/31/2022   10:17 AM 08/08/2022    2:17 PM 05/15/2022    3:32 PM 02/20/2022    3:14 PM  Fall Risk   Falls in the past year? 0 1 1 1 1   Number falls in past yr: 0 1 1 0 1  Injury with Fall? 0 0 0 1 1  Risk for fall due to :  Impaired balance/gait No Fall Risks No Fall Risks History of fall(s)  Follow up  Falls prevention discussed;Education provided;Falls evaluation completed Falls prevention discussed Falls prevention discussed Falls prevention discussed      Functional Status Survey:      Assessment & Plan  *** There are no diagnoses linked to this encounter.

## 2023-05-12 ENCOUNTER — Ambulatory Visit (INDEPENDENT_AMBULATORY_CARE_PROVIDER_SITE_OTHER): Payer: 59 | Admitting: Family Medicine

## 2023-05-12 ENCOUNTER — Encounter: Payer: Self-pay | Admitting: Family Medicine

## 2023-05-12 VITALS — BP 116/62 | HR 90 | Temp 98.2°F | Resp 16 | Ht 64.0 in | Wt 139.7 lb

## 2023-05-12 DIAGNOSIS — F250 Schizoaffective disorder, bipolar type: Secondary | ICD-10-CM | POA: Diagnosis not present

## 2023-05-12 DIAGNOSIS — D649 Anemia, unspecified: Secondary | ICD-10-CM

## 2023-05-12 DIAGNOSIS — R000 Tachycardia, unspecified: Secondary | ICD-10-CM | POA: Diagnosis not present

## 2023-05-12 DIAGNOSIS — K5909 Other constipation: Secondary | ICD-10-CM

## 2023-05-12 DIAGNOSIS — M8589 Other specified disorders of bone density and structure, multiple sites: Secondary | ICD-10-CM | POA: Diagnosis not present

## 2023-05-12 DIAGNOSIS — K219 Gastro-esophageal reflux disease without esophagitis: Secondary | ICD-10-CM

## 2023-05-12 DIAGNOSIS — E559 Vitamin D deficiency, unspecified: Secondary | ICD-10-CM

## 2023-05-12 DIAGNOSIS — R29898 Other symptoms and signs involving the musculoskeletal system: Secondary | ICD-10-CM

## 2023-05-12 DIAGNOSIS — E781 Pure hyperglyceridemia: Secondary | ICD-10-CM

## 2023-05-12 DIAGNOSIS — Z79899 Other long term (current) drug therapy: Secondary | ICD-10-CM

## 2023-05-12 LAB — CBC WITH DIFFERENTIAL/PLATELET
Eosinophils Relative: 3.1 %
MCV: 92.7 fL (ref 80.0–100.0)
Monocytes Relative: 6.3 %
RDW: 12 % (ref 11.0–15.0)
Total Lymphocyte: 33.8 %
WBC: 8.1 10*3/uL (ref 3.8–10.8)

## 2023-05-12 MED ORDER — METOPROLOL TARTRATE 25 MG PO TABS: 12.5000 mg | ORAL_TABLET | Freq: Two times a day (BID) | ORAL | 1 refills | Status: DC

## 2023-05-13 ENCOUNTER — Ambulatory Visit (INDEPENDENT_AMBULATORY_CARE_PROVIDER_SITE_OTHER): Payer: 59 | Admitting: Gastroenterology

## 2023-05-13 ENCOUNTER — Encounter: Payer: Self-pay | Admitting: Gastroenterology

## 2023-05-13 VITALS — BP 113/73 | HR 102 | Temp 98.7°F | Wt 139.8 lb

## 2023-05-13 DIAGNOSIS — K5909 Other constipation: Secondary | ICD-10-CM

## 2023-05-13 LAB — CBC WITH DIFFERENTIAL/PLATELET
Absolute Monocytes: 510 cells/uL (ref 200–950)
Basophils Absolute: 57 cells/uL (ref 0–200)
Basophils Relative: 0.7 %
Eosinophils Absolute: 251 cells/uL (ref 15–500)
HCT: 35.8 % (ref 35.0–45.0)
Hemoglobin: 11.7 g/dL (ref 11.7–15.5)
Lymphs Abs: 2738 cells/uL (ref 850–3900)
MCH: 30.3 pg (ref 27.0–33.0)
MCHC: 32.7 g/dL (ref 32.0–36.0)
MPV: 11.2 fL (ref 7.5–12.5)
Neutro Abs: 4544 cells/uL (ref 1500–7800)
Neutrophils Relative %: 56.1 %
Platelets: 243 10*3/uL (ref 140–400)
RBC: 3.86 10*6/uL (ref 3.80–5.10)

## 2023-05-13 LAB — LIPID PANEL
Cholesterol: 175 mg/dL (ref <200)
HDL: 56 mg/dL (ref >=50)
LDL Cholesterol (Calc): 88 mg/dL (calc)
Non-HDL Cholesterol (Calc): 119 mg/dL (calc) (ref <130)
Total CHOL/HDL Ratio: 3.1 (calc) (ref <5.0)
Triglycerides: 225 mg/dL — ABNORMAL HIGH (ref <150)

## 2023-05-13 LAB — COMPLETE METABOLIC PANEL WITH GFR
AG Ratio: 2.2 (calc) (ref 1.0–2.5)
ALT: 11 U/L (ref 6–29)
AST: 12 U/L (ref 10–35)
Albumin: 4.1 g/dL (ref 3.6–5.1)
Alkaline phosphatase (APISO): 79 U/L (ref 37–153)
BUN: 14 mg/dL (ref 7–25)
CO2: 28 mmol/L (ref 20–32)
Calcium: 9.2 mg/dL (ref 8.6–10.4)
Chloride: 106 mmol/L (ref 98–110)
Creat: 0.95 mg/dL (ref 0.50–1.05)
Globulin: 1.9 g/dL (calc) (ref 1.9–3.7)
Glucose, Bld: 100 mg/dL — ABNORMAL HIGH (ref 65–99)
Potassium: 4.1 mmol/L (ref 3.5–5.3)
Sodium: 141 mmol/L (ref 135–146)
Total Bilirubin: 0.3 mg/dL (ref 0.2–1.2)
Total Protein: 6 g/dL — ABNORMAL LOW (ref 6.1–8.1)
eGFR: 69 mL/min/{1.73_m2} (ref >=60)

## 2023-05-13 LAB — IRON,TIBC AND FERRITIN PANEL
%SAT: 22 % (calc) (ref 16–45)
Ferritin: 28 ng/mL (ref 16–232)
Iron: 75 ug/dL (ref 45–160)
TIBC: 338 mcg/dL (calc) (ref 250–450)

## 2023-05-13 LAB — B12 AND FOLATE PANEL
Folate: >24 ng/mL
Vitamin B-12: 698 pg/mL (ref 200–1100)

## 2023-05-13 LAB — VITAMIN D 25 HYDROXY (VIT D DEFICIENCY, FRACTURES): Vit D, 25-Hydroxy: 83 ng/mL (ref 30–100)

## 2023-05-13 MED ORDER — LACTULOSE 10 GM/15ML PO SOLN: 30.0000 g | Freq: Two times a day (BID) | ORAL | 11 refills | Status: AC | PRN

## 2023-05-13 MED ORDER — TRULANCE 3 MG PO TABS: 1.0000 | ORAL_TABLET | Freq: Every day | ORAL | 3 refills | Status: DC

## 2023-05-13 NOTE — Progress Notes (Signed)
Wyline Mood MD, MRCP(U.K) [address]  [address]  [place], [place] [address]  Main: [phone]  Fax: [phone]   Primary Care Physician: Alba Cory, MD  Primary Gastroenterologist:  Dr. Wyline Mood   Chief Complaint  Patient presents with   Constipation    HPI: Carla Thompson is a 62 y.o. female  Summary of history :   Initially referred and seen back in December 2021 for chronic constipation weight loss and incontinence stool.  In October 2021 x-ray of the abdomen showed moderate colonic stool burden.  She had constipation over 20 years.  Due to childbirth had a large vaginal tear extending to the rectum which required surgery and has had a surgical sphincteroplasty.  Has tried many agents in the past including Linzess, MiraLAX, Dulcolax, Colace which have not worked.  She can go for days without a bowel movement.  I performed a colonoscopy in December 2021.  And noted a very tortuous colon   Interval history 11/15/2020-02/22/2021    She is doing well here for refills of her Trulance and lactulose.  Takes it every day.    Current Outpatient Medications  Medication Sig Dispense Refill   Ascorbic Acid (VITAMIN C) 500 MG CHEW Chew by mouth.     Calcium Carbonate-Vit D-Min (CALCIUM 1200 PO) Take by mouth.     clonazePAM (KLONOPIN) 1 MG tablet TAKE 1/2 TABLET( 0.5 MG TOTAL) BY MOUTH IN THE MORNING, AT NOON AND AT BEDTIME. 45 tablet 3   cloZAPine (CLOZARIL) 100 MG tablet 4 tabs HS 120 tablet 6   famotidine (PEPCID) 40 MG tablet TAKE 1 TABLET(40 MG) BY MOUTH DAILY 90 tablet 1   lithium 300 MG tablet TAKE 2 TABLETS(600 MG) BY MOUTH AT BEDTIME 180 tablet 1   Magnesium 400 MG CAPS Take by mouth.     metoprolol tartrate (LOPRESSOR) 25 MG tablet Take 0.5 tablets (12.5 mg total) by mouth 2 (two) times daily. 90 tablet 1   Plecanatide (TRULANCE) 3 MG TABS Take 1 tablet (3 mg total) by mouth daily. 90 tablet 3   Vitamin D, Ergocalciferol, (DRISDOL) 1.25 MG (50000 UNIT)  CAPS capsule TAKE 1 CAPSULE BY MOUTH EVERY 7 DAYS 12 capsule 1   zinc gluconate 50 MG tablet Take 50 mg by mouth daily.     No current facility-administered medications for this visit.    Allergies as of 05/13/2023 - Review Complete 05/12/2023  Allergen Reaction Noted   Levsin [hyoscyamine sulfate] Hives 12/08/2015   Escitalopram Hives 12/08/2015    ROS:  General: Negative for anorexia, weight loss, fever, chills, fatigue, weakness. ENT: Negative for hoarseness, difficulty swallowing , nasal congestion. CV: Negative for chest pain, angina, palpitations, dyspnea on exertion, peripheral edema.  Respiratory: Negative for dyspnea at rest, dyspnea on exertion, cough, sputum, wheezing.  GI: See history of present illness. GU:  Negative for dysuria, hematuria, urinary incontinence, urinary frequency, nocturnal urination.  Endo: Negative for unusual weight change.    Physical Examination:   LMP 12/16/2016 (Approximate)   General: Well-nourished, well-developed in no acute distress.  Eyes: No icterus. Conjunctivae pink. Neuro: Alert and oriented x 3.  Grossly intact. Skin: Warm and dry, no jaundice.   Psych: Alert and cooperative, normal mood and affect.   Imaging Studies: No results found.  Assessment and Plan:   Carla Thompson is a 62 y.o. y/o female here to follow-up for chronic constipation.  Doing well on lactulose and Trulance.  Here for refills.  No other new  problems.   Dr Wyline Mood  MD,MRCP [facility]) Follow up in as needed

## 2023-05-15 ENCOUNTER — Telehealth: Payer: Self-pay

## 2023-05-15 NOTE — Telephone Encounter (Signed)
Pt given lab results per notes of Dr. Carlynn Purl on 05/15/23. Pt verbalized understanding. Pt will pu Iron supplement from O[facility] pharmacy. No further assistance needed.    Lipid panel improved Anemia resolved but iron storage is trending down. Continue high iron diet and supplementation B12 is normal  Written by Alba Cory, MD on 05/13/2023  1:12 PM EDT

## 2023-05-26 ENCOUNTER — Other Ambulatory Visit
Admission: RE | Admit: 2023-05-26 | Discharge: 2023-05-26 | Disposition: A | Discharge status: Home / Self Care | Payer: 59 | Attending: Psychiatry | Admitting: Psychiatry

## 2023-05-26 ENCOUNTER — Ambulatory Visit: Payer: Self-pay

## 2023-05-26 DIAGNOSIS — F250 Schizoaffective disorder, bipolar type: Secondary | ICD-10-CM | POA: Diagnosis present

## 2023-05-26 DIAGNOSIS — Z79899 Other long term (current) drug therapy: Secondary | ICD-10-CM | POA: Diagnosis not present

## 2023-05-26 LAB — CBC WITH DIFFERENTIAL/PLATELET
Abs Immature Granulocytes: 0.01 10*3/uL (ref 0.00–0.07)
Basophils Absolute: 0.1 10*3/uL (ref 0.0–0.1)
Basophils Relative: 1 %
Eosinophils Absolute: 0.2 10*3/uL (ref 0.0–0.5)
Eosinophils Relative: 3 %
HCT: 36.7 % (ref 36.0–46.0)
Hemoglobin: 11.6 g/dL — ABNORMAL LOW (ref 12.0–15.0)
Immature Granulocytes: 0 %
Lymphocytes Relative: 28 %
Lymphs Abs: 2.2 10*3/uL (ref 0.7–4.0)
MCH: 30.4 pg (ref 26.0–34.0)
MCHC: 31.6 g/dL (ref 30.0–36.0)
MCV: 96.1 fL (ref 80.0–100.0)
Monocytes Absolute: 0.5 10*3/uL (ref 0.1–1.0)
Monocytes Relative: 7 %
Neutro Abs: 4.9 10*3/uL (ref 1.7–7.7)
Neutrophils Relative %: 61 %
Platelets: 211 10*3/uL (ref 150–400)
RBC: 3.82 MIL/uL — ABNORMAL LOW (ref 3.87–5.11)
RDW: 12.6 % (ref 11.5–15.5)
WBC: 7.8 10*3/uL (ref 4.0–10.5)
nRBC: 0 % (ref 0.0–0.2)

## 2023-05-26 NOTE — Telephone Encounter (Signed)
  Chief Complaint: unusual stool color  Symptoms: black colored stools Frequency: several days  Pertinent Negatives: Patient denies abdominal pain and diarrhea  Disposition: [] ED /[] Urgent Care (no appt availability in office) / [] Appointment(In office/virtual)/ []  [facility]/ [x] Home Care/ [] Refused Recommended Disposition /[] [facility]/ []  Follow-up with PCP Additional Notes: Pt calling because she has been having black stools and larger harder stools. Pt has been taking Lactulose for constipation but was concerned of the black color. Pt recently started taking Iron Supplement OTC. Advised pt that her sx are r/t iron supplement. Pt is taking one every day so recommended she can take less often than every day until her bowels can get used to the iron. Also recommended she increase her fluid increase to help with bowels. Pt states she is eating high fiber foods and eats cereal 2-3 times a week and oatmeal the rest of the week. She is still taking lactulose to help as well. Pt also asked about her Triglycerides being elevated and educated on foods to help reduce that and pt took lots of notes during call and didn't need any further assistance.   Reason for Disposition  Unusual stool color probably from food or medicine  Answer Assessment - Initial Assessment Questions 1. COLOR: "What color is it?" "Is that color in part or all of the stool?"     Black in color 2. ONSET: "When was the unusual color first noted?"     Several days  3. CAUSE: "Have you eaten any food or taken any medicine of this color?" Note: See listing in Background Information section.      Iron supplement just recently  4. OTHER SYMPTOMS: "Do you have any other symptoms?" (e.g., abdomen pain, diarrhea, jaundice, fever).     No  Protocols used: Stools - Unusual Color-A-AH

## 2023-05-28 ENCOUNTER — Telehealth: Payer: Self-pay | Admitting: Psychiatry

## 2023-05-28 NOTE — Telephone Encounter (Signed)
Pt lvm that she needs the labs faxed to [facility] so she can get her clozapine. The pharmacy told her they don't have the results

## 2023-05-28 NOTE — Telephone Encounter (Signed)
Yesterday the system was down all day I will log them today

## 2023-05-28 NOTE — Telephone Encounter (Signed)
Pt LVM @ 4:02p asking for lab work to get sent to [facility] on [address].  Next appt 8/12

## 2023-06-17 ENCOUNTER — Other Ambulatory Visit: Payer: Self-pay | Admitting: Family Medicine

## 2023-06-17 ENCOUNTER — Other Ambulatory Visit: Payer: Self-pay | Admitting: Psychiatry

## 2023-06-17 DIAGNOSIS — R000 Tachycardia, unspecified: Secondary | ICD-10-CM

## 2023-06-17 DIAGNOSIS — F4001 Agoraphobia with panic disorder: Secondary | ICD-10-CM

## 2023-06-20 ENCOUNTER — Other Ambulatory Visit
Admission: RE | Admit: 2023-06-20 | Discharge: 2023-06-20 | Disposition: A | Discharge status: Home / Self Care | Payer: 59 | Source: Home / Self Care | Attending: Psychiatry | Admitting: Psychiatry

## 2023-06-20 DIAGNOSIS — Z79899 Other long term (current) drug therapy: Secondary | ICD-10-CM | POA: Diagnosis not present

## 2023-06-20 DIAGNOSIS — F250 Schizoaffective disorder, bipolar type: Secondary | ICD-10-CM | POA: Insufficient documentation

## 2023-06-20 LAB — CBC WITH DIFFERENTIAL/PLATELET
Abs Immature Granulocytes: 0.03 10*3/uL (ref 0.00–0.07)
Basophils Absolute: 0.1 10*3/uL (ref 0.0–0.1)
Basophils Relative: 1 %
Eosinophils Absolute: 0.2 10*3/uL (ref 0.0–0.5)
Eosinophils Relative: 2 %
HCT: 35.7 % — ABNORMAL LOW (ref 36.0–46.0)
Hemoglobin: 11.8 g/dL — ABNORMAL LOW (ref 12.0–15.0)
Immature Granulocytes: 0 %
Lymphocytes Relative: 22 %
Lymphs Abs: 1.9 10*3/uL (ref 0.7–4.0)
MCH: 31 pg (ref 26.0–34.0)
MCHC: 33.1 g/dL (ref 30.0–36.0)
MCV: 93.7 fL (ref 80.0–100.0)
Monocytes Absolute: 0.5 10*3/uL (ref 0.1–1.0)
Monocytes Relative: 5 %
Neutro Abs: 6 10*3/uL (ref 1.7–7.7)
Neutrophils Relative %: 70 %
Platelets: 234 10*3/uL (ref 150–400)
RBC: 3.81 MIL/uL — ABNORMAL LOW (ref 3.87–5.11)
RDW: 12.7 % (ref 11.5–15.5)
WBC: 8.7 10*3/uL (ref 4.0–10.5)
nRBC: 0 % (ref 0.0–0.2)

## 2023-06-24 ENCOUNTER — Telehealth: Payer: Self-pay | Admitting: Psychiatry

## 2023-06-24 NOTE — Telephone Encounter (Signed)
Next visit is 07/14/23. Lawson Fiscal needs her lab work faxed to [id number] - [place], [place] - [address] AT [facility] & [address]   Phone: [phone]  Fax: [phone]

## 2023-06-24 NOTE — Telephone Encounter (Signed)
Traci aware 7/19 lab not in [facility].

## 2023-06-25 ENCOUNTER — Telehealth: Payer: Self-pay | Admitting: Psychiatry

## 2023-06-25 NOTE — Telephone Encounter (Signed)
Pt left message requesting labs to be faxed to [facility]. Going out of town and need meds before Thursday. Pt contact [phone]

## 2023-06-25 NOTE — Telephone Encounter (Signed)
Labs were put in [facility] yesterday and the patient was notified of this.

## 2023-07-07 ENCOUNTER — Other Ambulatory Visit: Payer: Self-pay | Admitting: Psychiatry

## 2023-07-07 DIAGNOSIS — F250 Schizoaffective disorder, bipolar type: Secondary | ICD-10-CM

## 2023-07-14 ENCOUNTER — Encounter: Payer: Self-pay | Admitting: Psychiatry

## 2023-07-14 ENCOUNTER — Ambulatory Visit (INDEPENDENT_AMBULATORY_CARE_PROVIDER_SITE_OTHER): Payer: 59 | Admitting: Psychiatry

## 2023-07-14 DIAGNOSIS — Z79899 Other long term (current) drug therapy: Secondary | ICD-10-CM

## 2023-07-14 DIAGNOSIS — F4001 Agoraphobia with panic disorder: Secondary | ICD-10-CM

## 2023-07-14 DIAGNOSIS — F250 Schizoaffective disorder, bipolar type: Secondary | ICD-10-CM | POA: Diagnosis not present

## 2023-07-14 DIAGNOSIS — K5909 Other constipation: Secondary | ICD-10-CM | POA: Diagnosis not present

## 2023-07-14 MED ORDER — CLOZAPINE 100 MG PO TABS
ORAL_TABLET | ORAL | 6 refills | Status: DC
Start: 2023-07-14 — End: 2024-01-14

## 2023-07-14 MED ORDER — LITHIUM CARBONATE 300 MG PO TABS
ORAL_TABLET | ORAL | 1 refills | Status: DC
Start: 2023-07-14 — End: 2024-01-14

## 2023-07-14 MED ORDER — CLONAZEPAM 1 MG PO TABS
ORAL_TABLET | ORAL | 5 refills | Status: DC
Start: 2023-07-14 — End: 2024-01-12

## 2023-07-14 NOTE — Patient Instructions (Signed)
Change calcium and magnesium to citrate

## 2023-07-14 NOTE — Progress Notes (Signed)
Carla Thompson [id number] Nov 16, 1961 62 y.o.  Subjective:   Patient ID:  Carla Thompson is a 62 y.o. (DOB 09/05/1961) female.  Chief Complaint:  Chief Complaint  Patient presents with   Follow-up    Psych dxes and meds    HPI [name] presents to the office today for follow-up of schizoaffective disorder.  visit October, 2020.  No med changes.  She continued on clozapine 400, lithium 600, and clonazepam 0.5 twice daily as needed.   03/09/20 appt noted: StepF had been sick with Covid and other problems.  Died.  Other family members with Covid problems too and died. [name] worked from home Nov 08, 2023 to April  Good overall without complaints.  Stress dealing with Covid.  Trying to stay active including cooking.  No major episodes of confusion nor voices.    No fear.  Does emails on the computer.  Not much anxiety lately.  Sleep same 9-9.  No fear episodes.  No manic nor depressive episodes.  No significant panic attacks recently.  She is not avoiding things out of anxiety generally.  She's still active at church.   Large infrequent BM clog toilet and intermittently not well managed.  Plan: Still problems with large infrequent q 4-7 days, large BM.  Often clogs the toilet.  Not well managed. Increase Linzess trial to 290 mg daily to see if it's better managed.  09/07/20 appt with following noted: She has notes. Balance problems, movements in feet legs, stomach.  Marching movements in feet.  Scares her. 1 fall on stairs going up. Wonders about reducing clozapine to 3 instead of 4 bc of tiredness. B researched lithium and said lithium and clozapine are "bad medicines with a lot of SE".   she's worried about SE. Disc ran out of clozapine for 2 weeks and decompensated.  back on it for almost a month.  Pharmacy was saying they couldn't dispense clozapine without labs and they didn't have the labs.  H says she's not back to normal yet bc can't focus and make simple decisions.  For example  couldn't decide whether to drink water out of the faucet or out of Brita water filter.  Lost 10# and not eating well.  Poor appetite.   Severely constipated chronically.  Using miralax.  Plan: continue clozapine  11/09/20 appt with following noted:  Seen with [name] CO problems with chronic constipation and using Miralax twice daily.  It is helpful but varies.  Doesn't like clozapine for this reason.  Usual trouble is leakage from the stool.  Disc sphincter exercises.   Lost weight. Some depression over the TD including feet moving up and down.  Someitmes stomach moves.  Occ tremor but not lately.  Trouble with sleep, initial and middle. Some of the problems with getting clozapine may have been related to not getting th labs to the correct [facility] in [place].  Doesn't like prunes or prune juice..  Not attending church due to having to wear depends and "tardive dyskinesia".   Plan: Can switch to clonazepam 0.5 mg in am and 1.0 mg HS  02/20/2021 appointment with the following noted: Walks outside.   [name] got Covid for 3 weeks.  She got milder sx with assumption she had it also. No problems getting clozapine since here.  Have worked on getting it more consistently and that seems to be working.    Better than 6 mos ago.  Stronger physically and mentally.  No sig panic attacks now. Balance is  better.  Weight loss stopped.  Not depressed.   Better with more consistency with clozapine. Exercising more and that's helped.  Plan: Continue clozapine 400 mg HS  (She prefers all at once at night) Contnue lithium 600 mg HS Panic disorder controlled. clonazepam 0.75 mg BID   07/26/2021 appointment with the following noted: seen with [name] Hard time swallowing pills.  Disc jury duty.  She doesn't feel capable.  Doesn't drive.   Anxious about taking pills but otherwise is usually doing ok.  Tryitng to walk 6 times weekly and physically feels better.   Patient reports stable mood and denies depressed or  irritable moods.  Patient denies any recent difficulty with anxiety.  Patient denies difficulty with sleep initiation or maintenance. Denies appetite disturbance.  Patient reports that energy and motivation have been good.  Patient with chronic difficulty with concentration.  Patient denies any suicidal ideation. No problems getting clozapine lately. Plan: No med changes  01/22/2022 appointment with the following noted: Continues clozapine 400 mg nightly, lithium 600 mg nightly, clonazepam 0.75 mg twice daily For the most part doing well.  Except fell and hurt tailbone.  Fell in the dark at night and was disoriented to space and tripped. It continues to hurt after a couple of weeks. Asks about reducing clozapine to 3 nightly. No panic attacks since here. Plan: No med changes:  Continue clozapine 400 mg HS  (She prefers all at once at night) Lithium 600 mg HS clonazepam 0.75 mg BID  07/10/2022 appointment noted: seen with H per usual Has notes.  Larey Seat getting into car and asked about iburprofen.  Feb fall and broke tailbone.  Trying to be careful about falling.  Wonders if meds contribute to fall risk.  Asks why need 4 clozapine. Still moves feet at times when not needed. Sun AM sometimes hard to stay awake.  Never falls asleep. Not driven in 11 years. No panic. No paranoia and psychosis. Plan: no med changes.    01/06/23 appt noted:  seen with H per usual Has notes.  Keeping busy.  Trying to exercise but afraid of stairs bc afraid of falling.  Last year 4 falls.  Did some exercises including walking some stairs for a couple months.  I think it may have helped but she stopped it bc fear of falling.  Does some exercising.   Generally not dizzy upon standing except when first awakens. Has continued meds consistently.   Some mild tremor. Will move her feet at church and wonder if it is related to clozapine. No sig psychotic sx of delusions or hallucinations.   Chronic frustration with clozapine  bc constipation and balance issues.  Sometimes interferes with activities. She doesn't remember what her sx were like when poorly controlled.  07/14/23 appt noted:  seen with H Meds: clonazepam 1 mg 1/2 tab TID, clozapine 400 mg HS, lithium 600 mg HS.   She still wants to reduce bc having problems with constipation and then diarrhea.   Trulance helped GI px.  New med.   Also prn lactulose.   Still large stools.  Eats a lot of fruits and vegetables.   Also does exercise to help.   Drinks only water.  Doctor managing constipation.   Taking calcium carbonate and magnesium. Was told to take iron. Mood and anxiety are stable.  No sig delusions or hallucinations.   No sig fearfulness.  No panic.   Sleep is the same and ok.   Still gets concerns she  has TD bc episodes of moving feet in ways that seem out of control.  Does not happen when she lays down.   Asks opinions on several medical problems. She feels like she is hard of hearing.   2 gkids Royal Piedra , Pamelia Hoit 61 yo.     Oldest daughter married March 2020  On clozapine for many years with benefit.  Several psych hospitalizations.  She is failed multiple other psychiatric medications which is why she is on clozapine.  She also failed or relapsed with dosage reduction of clozapine or if she missed clozapine.  She is consistent with her dosage now.  Past Psychiatric Medication Trials: prior antipsychotic trials Clozapine 400 Clonazepam Lithium 600 Citrucil, Miralax, stool softener, Mg tablets  Sister has schizophrenia.  Review of Systems:  Review of Systems  Cardiovascular:  Negative for palpitations.  Gastrointestinal:  Positive for abdominal distention, constipation and diarrhea. Negative for abdominal pain.  Genitourinary:  Positive for enuresis.  Musculoskeletal:  Positive for back pain, neck pain and neck stiffness.  Neurological:        Chronic balance issues with stooped posture.  Psychiatric/Behavioral:  Positive for decreased  concentration, dysphoric mood and sleep disturbance. Negative for agitation, behavioral problems, confusion, hallucinations, self-injury and suicidal ideas. The patient is not nervous/anxious and is not hyperactive.   Constipation comes and goes  Medications: I have reviewed the patient's current medications.  Current Outpatient Medications  Medication Sig Dispense Refill   Ascorbic Acid (VITAMIN C) 500 MG CHEW Chew by mouth.     Calcium Carbonate-Vit D-Min (CALCIUM 1200 PO) Take by mouth.     famotidine (PEPCID) 40 MG tablet TAKE 1 TABLET(40 MG) BY MOUTH DAILY 90 tablet 1   lactulose (CHRONULAC) 10 GM/15ML solution Take 45 mLs (30 g total) by mouth 2 (two) times daily as needed for mild constipation. 946 mL 11   Magnesium 400 MG CAPS Take by mouth.     metoprolol tartrate (LOPRESSOR) 25 MG tablet TAKE 1/2 TABLET(12.5 MG) BY MOUTH TWICE DAILY 90 tablet 1   Plecanatide (TRULANCE) 3 MG TABS Take 1 tablet (3 mg total) by mouth daily. 90 tablet 3   Vitamin D, Ergocalciferol, (DRISDOL) 1.25 MG (50000 UNIT) CAPS capsule TAKE 1 CAPSULE BY MOUTH EVERY 7 DAYS 12 capsule 1   zinc gluconate 50 MG tablet Take 50 mg by mouth daily.     clonazePAM (KLONOPIN) 1 MG tablet TAKE 1/2 TABLET( 0.5 MG TOTAL) BY MOUTH IN THE MORNING, AT NOON AND AT BEDTIME. 45 tablet 5   cloZAPine (CLOZARIL) 100 MG tablet 4 tabs HS 120 tablet 6   lithium 300 MG tablet TAKE 2 TABLETS(600 MG) BY MOUTH AT BEDTIME 180 tablet 1   No current facility-administered medications for this visit.    Medication Side Effects: Other: alternating contstipation and diarrhea but has IBS  Allergies:  Allergies  Allergen Reactions   Levsin [Hyoscyamine Sulfate] Hives   Escitalopram Hives    Past Medical History:  Diagnosis Date   Abnormal perimenopausal bleeding    Bowel incontinence    Chronic constipation    Dermatitis    Dysplastic nevus 08/26/2022   L pinna ear, Moderate to Severe atypia, Excised 09/24/2022   High risk medication  use    Hyperglycemia    Hypertriglyceridemia    Iron deficiency anemia due to chronic blood loss    secondary to heavy flow   Metallic taste    Schizo-affective psychosis (HCC)    Schizoaffective disorder, bipolar type (HCC)  Dr. Lucille Passy    Family History  Problem Relation Age of Onset   Mental illness Sister        Schizophrenia and bipolar   Cancer Maternal Grandmother        Colon   Mental illness Paternal Grandmother        Schizophrenia   Breast cancer Neg Hx     Social History   Socioeconomic History   Marital status: Married    Spouse name: Viviann Spare   Number of children: 2   Years of education: Not on file   Highest education level: Bachelor's degree (e.g., BA, AB, BS)  Occupational History   Not on file  Tobacco Use   Smoking status: Never   Smokeless tobacco: Never  Vaping Use   Vaping status: Never Used  Substance and Sexual Activity   Alcohol use: No    Alcohol/week: 0.0 standard drinks of alcohol   Drug use: No   Sexual activity: Not Currently    Partners: Male  Other Topics Concern   Not on file  Social History Narrative   Not on file   Social Determinants of Health   Financial Resource Strain: Low Risk  (02/20/2022)   Overall Financial Resource Strain (CARDIA)    Difficulty of Paying Living Expenses: Not hard at all  Food Insecurity: No Food Insecurity (02/20/2022)   Hunger Vital Sign    Worried About Running Out of Food in the Last Year: Never true    Ran Out of Food in the Last Year: Never true  Transportation Needs: No Transportation Needs (02/20/2022)   PRAPARE - Administrator, Civil Service (Medical): No    Lack of Transportation (Non-Medical): No  Physical Activity: Sufficiently Active (02/20/2022)   Exercise Vital Sign    Days of Exercise per Week: 6 days    Minutes of Exercise per Session: 60 min  Stress: Stress Concern Present (02/20/2022)   [employer] of Occupational Health - Occupational Stress  Questionnaire    Feeling of Stress : To some extent  Social Connections: Socially Integrated (02/20/2022)   Social Connection and Isolation Panel [NHANES]    Frequency of Communication with Friends and Family: Three times a week    Frequency of Social Gatherings with Friends and Family: Three times a week    Attends Religious Services: More than 4 times per year    Active Member of Clubs or Organizations: Yes    Attends Banker Meetings: More than 4 times per year    Marital Status: Married  Catering manager Violence: Not At Risk (02/20/2022)   Humiliation, Afraid, Rape, and Kick questionnaire    Fear of Current or Ex-Partner: No    Emotionally Abused: No    Physically Abused: No    Sexually Abused: No    Past Medical History, Surgical history, Social history, and Family history were reviewed and updated as appropriate.   Please see review of systems for further details on the patient's review from today.   Objective:   Physical Exam:  LMP 12/16/2016 (Approximate)   Physical Exam Constitutional:      General: She is not in acute distress.    Appearance: She is well-developed.  Musculoskeletal:        General: No deformity.  Neurological:     Mental Status: She is alert and oriented to person, place, and time.     Cranial Nerves: No dysarthria.     Coordination: Coordination normal.  Psychiatric:  Attention and Perception: Perception normal. She is inattentive. She does not perceive auditory or visual hallucinations.        Mood and Affect: Mood is anxious. Mood is not depressed. Affect is not labile, blunt, angry, tearful or inappropriate.        Speech: Speech is not rapid and pressured, slurred or tangential.        Behavior: Behavior normal. Behavior is cooperative.        Thought Content: Thought content is not paranoid or delusional. Thought content does not include homicidal or suicidal ideation. Thought content does not include suicidal plan.         Cognition and Memory: Cognition is impaired. She exhibits impaired recent memory.     Comments: Insight fair to poor & judgment fair. Scattered thought chronically and talkative and  overinclusiveness but not as severe as in the past.  Difficult to finish session on time.  Not as fidgety as in the past.  Affect more relaxed. Not irritable Chronic stooped posture.       Lab Review:     Component Value Date/Time   NA 141 05/12/2023 1539   NA 140 01/18/2016 1418   NA 139 08/27/2013 0930   K 4.1 05/12/2023 1539   K 4.2 08/27/2013 0930   CL 106 05/12/2023 1539   CL 108 (H) 08/27/2013 0930   CO2 28 05/12/2023 1539   CO2 28 08/27/2013 0930   GLUCOSE 100 (H) 05/12/2023 1539   GLUCOSE 98 08/27/2013 0930   BUN 14 05/12/2023 1539   BUN 17 01/18/2016 1418   BUN 11 08/27/2013 0930   CREATININE 0.95 05/12/2023 1539   CALCIUM 9.2 05/12/2023 1539   CALCIUM 8.5 08/27/2013 0930   PROT 6.0 (L) 05/12/2023 1539   PROT 6.6 01/18/2016 1418   PROT 7.2 08/27/2013 0930   ALBUMIN 4.3 09/11/2020 2000   ALBUMIN 4.4 01/18/2016 1418   ALBUMIN 3.5 08/27/2013 0930   AST 12 05/12/2023 1539   AST 20 08/27/2013 0930   ALT 11 05/12/2023 1539   ALT 19 08/27/2013 0930   ALKPHOS 95 09/11/2020 2000   ALKPHOS 107 08/27/2013 0930   BILITOT 0.3 05/12/2023 1539   BILITOT <0.2 01/18/2016 1418   BILITOT 0.3 08/27/2013 0930   GFRNONAA >60 09/11/2020 2000   GFRNONAA 63 07/11/2020 1558   GFRAA >60 08/31/2020 1633   GFRAA 73 07/11/2020 1558       Component Value Date/Time   WBC 8.7 06/20/2023 1200   RBC 3.81 (L) 06/20/2023 1200   HGB 11.8 (L) 06/20/2023 1200   HGB 12.3 01/23/2016 1231   HCT 35.7 (L) 06/20/2023 1200   HCT 38.6 01/23/2016 1231   PLT 234 06/20/2023 1200   PLT 280 01/23/2016 1231   MCV 93.7 06/20/2023 1200   MCV 95 01/23/2016 1231   MCV 92 03/24/2015 1244   MCH 31.0 06/20/2023 1200   MCHC 33.1 06/20/2023 1200   RDW 12.7 06/20/2023 1200   RDW 13.7 01/23/2016 1231   RDW 13.1 03/24/2015  1244   LYMPHSABS 1.9 06/20/2023 1200   LYMPHSABS 2.4 01/23/2016 1231   LYMPHSABS 2.2 03/24/2015 1244   MONOABS 0.5 06/20/2023 1200   MONOABS 0.4 03/24/2015 1244   EOSABS 0.2 06/20/2023 1200   EOSABS 0.1 01/23/2016 1231   EOSABS 0.4 03/24/2015 1244   BASOSABS 0.1 06/20/2023 1200   BASOSABS 0.0 01/23/2016 1231   BASOSABS 0.0 03/24/2015 1244    Lithium Lvl  Date Value Ref Range Status  09/12/2020 0.46 (L) 0.60 -  1.20 mmol/L Final    Comment:    Performed at [facility], [address] [place], [place] [address]     Lab Results  Component Value Date   VALPROATE 102 (H) 12/26/2015     .res Assessment: Plan:    Schizoaffective disorder, bipolar type (HCC) - Plan: Lithium level, CBC with Differential/Platelet, cloZAPine (CLOZARIL) 100 MG tablet, lithium 300 MG tablet  Panic disorder with agoraphobia - Plan: clonazePAM (KLONOPIN) 1 MG tablet  Long term current use of clozapine - Plan: CBC with Differential/Platelet  Lithium use - Plan: Lithium level  Chronic constipation   Overall her schizoaffective disorder was under control.  Until she ran out of clozapine bc of difficulty getting it last summer/fall 2021.  This led to more disorganziation, confusion, anxiety and rumination.  Insight is not good.  Not self aware about overinclusiveness. All of this is better now with better clozapine consistency and exercise.  Discussed the importance of consistent with the clozapine.  Disc severity of sx she had when inadequate dose of clozapine.  If they have difficulty obtaining it in the future please contact us right away.  She missed an off of the clozapine to cause disorganization and psychotic symptoms as well as anxiety.  In past when resumed took many weeks for psychosis to resolve.  Answered questions about clozapine and alternatives.  This has been much more effective and will not cause TD.  No comparable alternative. Continue clozapine 400 mg HS  (She prefers all at  once at night)  Panic disorder controlled. Consider reduction bc balance but history poor tolerance and coping with panic.  Spread out dose to see if balance better.  She's not sure clonazepam 0.5 mg TID   Contnue lithium 600 mg HS.  Need repeat lithium level.  Counseled patient regarding potential benefits, risks, and side effects of lithium to include potential risk of lithium affecting thyroid and renal function.  Discussed need for periodic lab monitoring to determine drug level and to assess for potential adverse effects.  Counseled patient regarding signs and symptoms of lithium toxicity and advised that they notify office immediately or seek urgent medical attention if experiencing these signs and symptoms.  Patient advised to contact office with any questions or concerns. Disc DDI with NSAIDs.  Need lithium level  regularly. BMP 05/2023 normal.  Fall precautions around sedative medications. Disc PT for head forward posture which she did some.  Emphasized this as means to help reduce fall risk.  This was emphasized again.  ANC stable . Continue clozapine.   Continue lab test per usual.    Vitamin D in winter.  GI managing constipation better.  Exercise helps constipation and new meds.   She is walking 6 days per week.  Disc getting help for hearing impairment and getting assessed.    No med changes:  Continue clozapine 400 mg HS  (She prefers all at once at night).  Risky to reduce bc took months to get better when she was psychotic before.  Rec not reduce. Lithium 600 mg HS clonazepam 0.5 mg TID  Check lithium level & labs.  She wants to consider reduction in clozapine DT constipation.  However,  She also failed or relapsed with dosage reduction of clozapine or if she missed clozapine.  Strongly rec against reducing the dose.    This appt was 30 mins.  FU 6 mos  Meredith Staggers, MD, DFAPA   Please see After Visit Summary for patient specific instructions.  Future  Appointments  Date Time Provider Department Center  07/21/2023  3:15 PM Felecia Shelling, DPM [facility]  11/11/2023  3:20 PM Alba Cory, MD [facility]  01/15/2024  4:00 PM Deirdre Evener, MD [facility] None    Orders Placed This Encounter  Procedures   Lithium level   CBC with Differential/Platelet       -------------------------------

## 2023-07-21 ENCOUNTER — Other Ambulatory Visit: Payer: Self-pay | Admitting: Family Medicine

## 2023-07-21 ENCOUNTER — Ambulatory Visit: Payer: 59 | Admitting: Podiatry

## 2023-07-21 DIAGNOSIS — K219 Gastro-esophageal reflux disease without esophagitis: Secondary | ICD-10-CM

## 2023-07-22 ENCOUNTER — Other Ambulatory Visit
Admission: RE | Admit: 2023-07-22 | Discharge: 2023-07-22 | Disposition: A | Discharge status: Home / Self Care | Payer: 59 | Source: Ambulatory Visit | Attending: Psychiatry | Admitting: Psychiatry

## 2023-07-22 DIAGNOSIS — Z79899 Other long term (current) drug therapy: Secondary | ICD-10-CM | POA: Diagnosis present

## 2023-07-22 DIAGNOSIS — F250 Schizoaffective disorder, bipolar type: Secondary | ICD-10-CM | POA: Diagnosis not present

## 2023-07-22 LAB — CBC WITH DIFFERENTIAL/PLATELET
Abs Immature Granulocytes: 0.02 10*3/uL (ref 0.00–0.07)
Basophils Absolute: 0.1 10*3/uL (ref 0.0–0.1)
Basophils Relative: 1 %
Eosinophils Absolute: 0.6 10*3/uL — ABNORMAL HIGH (ref 0.0–0.5)
Eosinophils Relative: 6 %
HCT: 35.6 % — ABNORMAL LOW (ref 36.0–46.0)
Hemoglobin: 11.7 g/dL — ABNORMAL LOW (ref 12.0–15.0)
Immature Granulocytes: 0 %
Lymphocytes Relative: 28 %
Lymphs Abs: 2.5 10*3/uL (ref 0.7–4.0)
MCH: 30.7 pg (ref 26.0–34.0)
MCHC: 32.9 g/dL (ref 30.0–36.0)
MCV: 93.4 fL (ref 80.0–100.0)
Monocytes Absolute: 0.5 10*3/uL (ref 0.1–1.0)
Monocytes Relative: 6 %
Neutro Abs: 5.3 10*3/uL (ref 1.7–7.7)
Neutrophils Relative %: 59 %
Platelets: 242 10*3/uL (ref 150–400)
RBC: 3.81 MIL/uL — ABNORMAL LOW (ref 3.87–5.11)
RDW: 12.7 % (ref 11.5–15.5)
WBC: 9 10*3/uL (ref 4.0–10.5)
nRBC: 0 % (ref 0.0–0.2)

## 2023-07-22 LAB — LITHIUM LEVEL: Lithium Lvl: 0.57 mmol/L — ABNORMAL LOW (ref 0.60–1.20)

## 2023-07-29 ENCOUNTER — Encounter: Payer: Self-pay | Admitting: Podiatry

## 2023-07-29 ENCOUNTER — Ambulatory Visit: Payer: 59 | Admitting: Podiatry

## 2023-07-29 DIAGNOSIS — M79675 Pain in left toe(s): Secondary | ICD-10-CM

## 2023-07-29 DIAGNOSIS — M79674 Pain in right toe(s): Secondary | ICD-10-CM | POA: Diagnosis not present

## 2023-07-29 DIAGNOSIS — B351 Tinea unguium: Secondary | ICD-10-CM

## 2023-07-29 DIAGNOSIS — M722 Plantar fascial fibromatosis: Secondary | ICD-10-CM | POA: Diagnosis not present

## 2023-07-29 NOTE — Progress Notes (Signed)
   Chief Complaint  Patient presents with   Foot Orthotics    "I need new pads."   Nail Problem    "I have an ingrown toenail on my left foot.  My heels are really rough." N - ingrown toenail L - hallux left D - 1 year O - gradually worse C - tender A - none T - none    Subjective: 62 y.o. female presenting today with her husband for follow-up evaluation of bilateral heel pain.  Patient states that the OTC power step insoles helped significantly.  She is requesting new ones today.  She also has a painful toenail to the right hallux nail plate.  She does not recall an injury about 9 months ago to the right hallux nail plate.  It is somewhat tender in shoes.   Past Medical History:  Diagnosis Date   Abnormal perimenopausal bleeding    Bowel incontinence    Chronic constipation    Dermatitis    Dysplastic nevus 08/26/2022   L pinna ear, Moderate to Severe atypia, Excised 09/24/2022   High risk medication use    Hyperglycemia    Hypertriglyceridemia    Iron deficiency anemia due to chronic blood loss    secondary to heavy flow   Metallic taste    Schizo-affective psychosis (HCC)    Schizoaffective disorder, bipolar type (HCC)    Dr. Lucille Passy   Objective: Physical Exam General: The patient is alert and oriented x3 in no acute distress.  Dermatology: Hyperkeratotic elongated nails noted 1-5 bilateral with associated tenderness with palpation.   Vascular: Dorsalis Pedis and Posterior Tibial pulses palpable bilateral.  Capillary fill time is immediate to all digits.  Neurological: Grossly intact via light touch  Musculoskeletal: Minimal tenderness to palpation to the plantar aspect of the bilateral heels along the plantar fascia. All other joints range of motion within normal limits bilateral. Strength 5/5 in all groups bilateral.   Assessment: 1. plantar fasciitis bilateral feet 2.  Pain due to onychomycosis of toenails above  Plan of Care:  -Patient  evaluated. -Mechanical debridement of nails 1-5 bilateral was performed using a nail nipper without incident or bleeding.  Patient felt significant relief -OTC power step insoles were dispensed.  She says that the power step insoles have helped significantly in the past to alleviate any heel pain -Advised against going barefoot.  Recommend good supportive shoes and sneakers -Return to clinic as needed  Felecia Shelling, DPM [facility]  Dr. Felecia Shelling, DPM    [address] [place], [place] [address]                Office [phone]  Fax [phone]

## 2023-07-29 NOTE — Progress Notes (Signed)
Lithium level stable 0.57 over last few years. No changes

## 2023-08-15 ENCOUNTER — Other Ambulatory Visit: Payer: Self-pay | Admitting: Family Medicine

## 2023-08-15 DIAGNOSIS — E559 Vitamin D deficiency, unspecified: Secondary | ICD-10-CM

## 2023-08-18 ENCOUNTER — Other Ambulatory Visit
Admission: RE | Admit: 2023-08-18 | Discharge: 2023-08-18 | Disposition: A | Discharge status: Home / Self Care | Payer: 59 | Source: Ambulatory Visit | Attending: Psychiatry | Admitting: Psychiatry

## 2023-08-18 DIAGNOSIS — F250 Schizoaffective disorder, bipolar type: Secondary | ICD-10-CM | POA: Insufficient documentation

## 2023-08-18 DIAGNOSIS — Z79899 Other long term (current) drug therapy: Secondary | ICD-10-CM | POA: Insufficient documentation

## 2023-08-18 LAB — CBC WITH DIFFERENTIAL/PLATELET
Abs Immature Granulocytes: 0.02 10*3/uL (ref 0.00–0.07)
Basophils Absolute: 0.1 10*3/uL (ref 0.0–0.1)
Basophils Relative: 1 %
Eosinophils Absolute: 0.3 10*3/uL (ref 0.0–0.5)
Eosinophils Relative: 4 %
HCT: 36.9 % (ref 36.0–46.0)
Hemoglobin: 11.8 g/dL — ABNORMAL LOW (ref 12.0–15.0)
Immature Granulocytes: 0 %
Lymphocytes Relative: 28 %
Lymphs Abs: 2.1 10*3/uL (ref 0.7–4.0)
MCH: 30.6 pg (ref 26.0–34.0)
MCHC: 32 g/dL (ref 30.0–36.0)
MCV: 95.8 fL (ref 80.0–100.0)
Monocytes Absolute: 0.4 10*3/uL (ref 0.1–1.0)
Monocytes Relative: 6 %
Neutro Abs: 4.7 10*3/uL (ref 1.7–7.7)
Neutrophils Relative %: 61 %
Platelets: 223 10*3/uL (ref 150–400)
RBC: 3.85 MIL/uL — ABNORMAL LOW (ref 3.87–5.11)
RDW: 12.7 % (ref 11.5–15.5)
WBC: 7.7 10*3/uL (ref 4.0–10.5)
nRBC: 0 % (ref 0.0–0.2)

## 2023-08-18 LAB — LITHIUM LEVEL: Lithium Lvl: 0.49 mmol/L — ABNORMAL LOW (ref 0.60–1.20)

## 2023-08-19 ENCOUNTER — Ambulatory Visit: Payer: Self-pay | Admitting: *Deleted

## 2023-08-19 NOTE — Telephone Encounter (Signed)
Lvm that sowles has noting till her appt in nov

## 2023-08-19 NOTE — Telephone Encounter (Signed)
Summary: hearing problems   Per agent: " Pt called in having decreased hearing,in both ears,  no appt, until Nov 6.  With Dr Carlynn Purl, she only wanted to see. She says this has been going on for 6 months       Chief Complaint: Hearing loss Symptoms: Decreased hearing, noted 6 months ago Frequency: 6 months Pertinent Negatives: Patient denies ringing, pain Disposition: [] ED /[] Urgent Care (no appt availability in office) / [x] Appointment(In office/virtual)/ []  [facility]/ [] Home Care/ [] Refused Recommended Disposition /[] [facility]/ []  Follow-up with PCP Additional Notes: Secured first available as pt wishes to see Dr. Carlynn Purl only. Placed on wait list. Care advise provided, pt verbalizes understanding.  Pt requesting earlier appt if available. Dr. Carlynn Purl only.  Reason for Disposition  Decreased hearing (gradual) associated with aging  Answer Assessment - Initial Assessment Questions 1. DESCRIPTION: "What type of hearing problem are you having? Describe it for me." (e.g., complete hearing loss, partial loss)     Decreased hearing 2. LOCATION: "One or both ears?" If one, ask: "Which ear?"     Probably both 3. SEVERITY: "Can you hear anything?" If Yes, ask: "What can you hear?" (e.g., ticking watch, whisper, talking)   - MILD:  Difficulty hearing soft speech, quiet library sounds, or speech from a distance or over background noise.   - MODERATE: Difficulty hearing normal speech even at closed distances.   - SEVERE: Unable to hear most normal conversation and talking; only able to hear loud sounds such as an alarm clock.     Moderate, Varies 4. ONSET: "When did this begin?" "Did it start suddenly or come on gradually?"     6 months ago 5. PATTERN: "Does this come and go, or has it been constant since it started?"     Constant 6. PAIN: "Is there any pain in your ear(s)?"  (Scale 1-10; or mild, moderate, severe)   - NONE (0): no pain   - MILD (1-3): doesn't interfere  with normal activities    - MODERATE (4-7): interferes with normal activities or awakens from sleep    - SEVERE (8-10): excruciating pain, unable to do any normal activities      No 7. CAUSE: "What do you think is causing this hearing problem?"     Unsure 8. OTHER SYMPTOMS: "Do you have any other symptoms?" (e.g., dizziness, ringing in ears)     No  Protocols used: Hearing Loss or Change-A-AH

## 2023-08-20 ENCOUNTER — Telehealth: Payer: Self-pay | Admitting: Psychiatry

## 2023-08-20 NOTE — Telephone Encounter (Signed)
Patient lvm at 10:33 for refill on Clozapine 100mg . States that if CC could check her labs. PH: [phone] Appt 2/12 Pharmacy [facility] [address] [place]

## 2023-08-20 NOTE — Telephone Encounter (Signed)
Traci said status form had been updated. Notified patient of this.

## 2023-08-20 NOTE — Telephone Encounter (Signed)
Patient has RF available. Last labs done 9/16.

## 2023-08-28 ENCOUNTER — Other Ambulatory Visit: Payer: Self-pay | Admitting: Family Medicine

## 2023-08-28 DIAGNOSIS — Z1231 Encounter for screening mammogram for malignant neoplasm of breast: Secondary | ICD-10-CM

## 2023-09-02 ENCOUNTER — Telehealth: Payer: Self-pay

## 2023-09-02 MED ORDER — TRULANCE 3 MG PO TABS: 1.0000 | ORAL_TABLET | Freq: Every day | ORAL | 2 refills | Status: DC

## 2023-09-02 NOTE — Telephone Encounter (Signed)
Patient is calling for a refill on her Trulance 3mg  she wants this sent to [facility]. Last seen on 05/13/2023 and follow up as needed  Sent medications to the pharmacy

## 2023-09-04 ENCOUNTER — Telehealth: Payer: Self-pay | Admitting: Psychiatry

## 2023-09-04 NOTE — Telephone Encounter (Signed)
These are not new problems though they may get better and worse.  She has a GI doctor and they are managing her GI px.  Clozapine does tend to cause constipation but GI is managing that. Current meds should not be causing dry mouth or nausea.  She does need to drink lots of water with the meds. She and I and her H disc her med concerns at the last appt.  She has relapsed on lower doses of clozapine and it took a long time for her to get better.  It would be a mistake to try to reduce that again.

## 2023-09-04 NOTE — Telephone Encounter (Signed)
Pt LVM @ 10:17a.  She said she is having side effects from the medicine  (didn't name the medication).  She said she has burning sensation in her mouth, dry mouth and nausea.   Next appt 2/12

## 2023-09-04 NOTE — Telephone Encounter (Signed)
Patient with c/o burning sensation in her mouth, dry mouth, nausea, and occasional stomach spasms. She has difficulty trying to verbalize the stomach spasms. They don't occur every day, but is not able to provide more information.  She said the clozapine causes either diarrhea or constipation and your note mentions she would like to reduce it. She is on calcium, magnesium, vitamin C, and zinc supplements.   You recommended she change her calcium and magnesium to citrate. She said her husband wanted her to use up what she already had and has not changed.

## 2023-09-05 NOTE — Telephone Encounter (Signed)
Patient notified

## 2023-09-19 ENCOUNTER — Other Ambulatory Visit: Payer: Self-pay | Admitting: Family Medicine

## 2023-09-19 DIAGNOSIS — K219 Gastro-esophageal reflux disease without esophagitis: Secondary | ICD-10-CM

## 2023-09-22 ENCOUNTER — Other Ambulatory Visit
Admission: RE | Admit: 2023-09-22 | Discharge: 2023-09-22 | Disposition: A | Discharge status: Home / Self Care | Payer: 59 | Source: Ambulatory Visit | Attending: Psychiatry | Admitting: Psychiatry

## 2023-09-22 DIAGNOSIS — F250 Schizoaffective disorder, bipolar type: Secondary | ICD-10-CM | POA: Insufficient documentation

## 2023-09-22 DIAGNOSIS — Z79899 Other long term (current) drug therapy: Secondary | ICD-10-CM | POA: Diagnosis present

## 2023-09-22 LAB — CBC WITH DIFFERENTIAL/PLATELET
Abs Immature Granulocytes: 0.02 10*3/uL (ref 0.00–0.07)
Basophils Absolute: 0.1 10*3/uL (ref 0.0–0.1)
Basophils Relative: 1 %
Eosinophils Absolute: 0.2 10*3/uL (ref 0.0–0.5)
Eosinophils Relative: 3 %
HCT: 37.6 % (ref 36.0–46.0)
Hemoglobin: 12 g/dL (ref 12.0–15.0)
Immature Granulocytes: 0 %
Lymphocytes Relative: 30 %
Lymphs Abs: 2.5 10*3/uL (ref 0.7–4.0)
MCH: 30.6 pg (ref 26.0–34.0)
MCHC: 31.9 g/dL (ref 30.0–36.0)
MCV: 95.9 fL (ref 80.0–100.0)
Monocytes Absolute: 0.5 10*3/uL (ref 0.1–1.0)
Monocytes Relative: 6 %
Neutro Abs: 5 10*3/uL (ref 1.7–7.7)
Neutrophils Relative %: 60 %
Platelets: 221 10*3/uL (ref 150–400)
RBC: 3.92 MIL/uL (ref 3.87–5.11)
RDW: 12.3 % (ref 11.5–15.5)
WBC: 8.3 10*3/uL (ref 4.0–10.5)
nRBC: 0 % (ref 0.0–0.2)

## 2023-09-22 LAB — LITHIUM LEVEL: Lithium Lvl: 0.62 mmol/L (ref 0.60–1.20)

## 2023-10-07 NOTE — Progress Notes (Unsigned)
Name: Carla Thompson   MRN: [id number]    DOB: [birth year]   Date:10/08/2023       Progress Note  Subjective  Chief Complaint  Decreased Hearing  HPI  Ear fullness: patient scheduled a visit due to decrease in hearing and ear fullness for the past 6 months. She has noticed cerumen that is dark in color from both ears. No fever or chills.   IBS constipation type: she sees Dr. Tobi Bastos , takes Trulance daily and lactulose prn, she has bowel movements a few times a week and has intermittent stomach spasms, advised gas X. Denies blood in stools.   Back spasms, fatigue when standing for a long time: chronic, sees chiropractor, had PT in the past. Only walks for about 15 minutes per day, slow pace around her block. Discussed deconditioning again and importance of regular physical activity   Patient Active Problem List   Diagnosis Date Noted   Vitamin D deficiency 05/15/2022   Hyperlipidemia 11/20/2017   GERD without esophagitis 02/21/2017   Tachycardia 02/20/2016   RLS (restless legs syndrome) 12/21/2015   Irritable bowel syndrome with constipation 12/21/2015   Other long term (current) drug therapy 12/08/2015   Chronic constipation 12/08/2015   Insomnia, persistent 12/08/2015   Dermatitis, eczematoid 12/08/2015   Gravida 2 para 2 12/08/2015   Hypertriglyceridemia 12/08/2015   Metallic taste 12/08/2015   Female climacteric state 12/08/2015   Schizoaffective disorder, bipolar type (HCC) 12/08/2015    Past Surgical History:  Procedure Laterality Date   ANUS SURGERY  2000   [place]   COLONOSCOPY     COLONOSCOPY WITH PROPOFOL N/A 11/30/2020   Procedure: COLONOSCOPY WITH PROPOFOL;  Surgeon: Wyline Mood, MD;  Location: [facility] ENDOSCOPY;  Service: Gastroenterology;  Laterality: N/A;   TUBAL LIGATION  12/02/1994    Family History  Problem Relation Age of Onset   Mental illness Sister        Schizophrenia and bipolar   Cancer Maternal Grandmother        Colon   Mental illness  Paternal Grandmother        Schizophrenia   Breast cancer Neg Hx     Social History   Tobacco Use   Smoking status: Never   Smokeless tobacco: Never  Substance Use Topics   Alcohol use: No    Alcohol/week: 0.0 standard drinks of alcohol     Current Outpatient Medications:    Ascorbic Acid (VITAMIN C) 500 MG CHEW, Chew by mouth., Disp: , Rfl:    Calcium Carbonate-Vit D-Min (CALCIUM 1200 PO), Take by mouth., Disp: , Rfl:    clonazePAM (KLONOPIN) 1 MG tablet, TAKE 1/2 TABLET( 0.5 MG TOTAL) BY MOUTH IN THE MORNING, AT NOON AND AT BEDTIME., Disp: 45 tablet, Rfl: 5   cloZAPine (CLOZARIL) 100 MG tablet, 4 tabs HS, Disp: 120 tablet, Rfl: 6   famotidine (PEPCID) 40 MG tablet, TAKE 1 TABLET(40 MG) BY MOUTH DAILY, Disp: 90 tablet, Rfl: 1   lactulose (CHRONULAC) 10 GM/15ML solution, Take 45 mLs (30 g total) by mouth 2 (two) times daily as needed for mild constipation., Disp: 946 mL, Rfl: 11   lithium 300 MG tablet, TAKE 2 TABLETS(600 MG) BY MOUTH AT BEDTIME, Disp: 180 tablet, Rfl: 1   Magnesium 400 MG CAPS, Take by mouth., Disp: , Rfl:    metoprolol tartrate (LOPRESSOR) 25 MG tablet, TAKE 1/2 TABLET(12.5 MG) BY MOUTH TWICE DAILY, Disp: 90 tablet, Rfl: 1   Plecanatide (TRULANCE) 3 MG TABS, Take 1 tablet (3 mg total)  by mouth daily., Disp: 90 tablet, Rfl: 2   Vitamin D, Ergocalciferol, (DRISDOL) 1.25 MG (50000 UNIT) CAPS capsule, TAKE 1 CAPSULE BY MOUTH EVERY 7 DAYS, Disp: 12 capsule, Rfl: 1   zinc gluconate 50 MG tablet, Take 50 mg by mouth daily., Disp: , Rfl:   Allergies  Allergen Reactions   Levsin [Hyoscyamine Sulfate] Hives   Escitalopram Hives    I personally reviewed active problem list, medication list, allergies, family history, social history, health maintenance with the patient/caregiver today.   ROS  Ten systems reviewed and is negative except as mentioned in HPI    Objective  Vitals:   10/08/23 0938  BP: 112/72  Pulse: 85  Resp: 14  Temp: 98.1 F (36.7 C)  TempSrc:  Oral  SpO2: 97%  Weight: 143 lb 12.8 oz (65.2 kg)  Height: 5\' 4"  (1.626 m)    Body mass index is 24.68 kg/m.  Physical Exam  Constitutional: Patient appears well-developed and well-nourished. Pale HEENT: head atraumatic, normocephalic, pupils equal and reactive to light, ears cerumen impaction on both ears , neck supple, throat within normal limits Cardiovascular: Normal rate, regular rhythm and normal heart sounds.  No murmur heard. No BLE edema. Pulmonary/Chest: Effort normal and breath sounds normal. No respiratory distress. Abdominal: Soft.  There is no tenderness. Psychiatric: Patient has a normal mood and affect. behavior is normal. Judgment and thought content normal.  Muscular skeletal: neck is forward, some postural kyphosis noticed    PHQ2/9:    05/12/2023    2:57 PM 11/29/2022    3:43 PM 10/31/2022   10:09 AM 08/08/2022    2:17 PM 05/15/2022    3:32 PM  Depression screen PHQ 2/9  Decreased Interest 0 0 0 0 0  Down, Depressed, Hopeless 0 0 0 0 0  PHQ - 2 Score 0 0 0 0 0  Altered sleeping 0 0 0 0   Tired, decreased energy 0 0 0 0   Change in appetite 0 0 0 0   Feeling bad or failure about yourself  0 0 0 0   Trouble concentrating 0 0 0 0   Moving slowly or fidgety/restless 0 0 0 0   Suicidal thoughts 0 0 0 0   PHQ-9 Score 0 0 0 0   Difficult doing work/chores Not difficult at all Not difficult at all Not difficult at all      phq 9 is negative   Fall Risk:    10/08/2023    9:39 AM 05/12/2023    2:56 PM 11/29/2022    3:39 PM 10/31/2022   10:17 AM 08/08/2022    2:17 PM  Fall Risk   Falls in the past year? 1 1 0 1 1  Number falls in past yr: 0 1 0 1 1  Injury with Fall? 1 1 0 0 0  Risk for fall due to : History of fall(s)   Impaired balance/gait No Fall Risks  Follow up Education provided;Falls prevention discussed;Falls evaluation completed   Falls prevention discussed;Education provided;Falls evaluation completed Falls prevention discussed     Assessment  & Plan   1. Ear fullness, bilateral  - Hearing screening; Future  2. Muscular deconditioning  She wants to hold off on PT, she will try home chair yoga and walk more often around the block   3. Irritable bowel syndrome with constipation  Explained GI spasms likely secondary to constipation advised Gas-X for now  4. Neck muscle weakness  She does not want to resume  PT at this time. Husband states reads and looks down frequently   5. Bilateral impacted cerumen  - Ear Lavage   Verbal consent given Possible side effects discussed with patient Ears were  lavaged with warm water and peroxide  Patient tolerated procedure well No complications

## 2023-10-08 ENCOUNTER — Ambulatory Visit (INDEPENDENT_AMBULATORY_CARE_PROVIDER_SITE_OTHER): Payer: 59 | Admitting: Family Medicine

## 2023-10-08 ENCOUNTER — Encounter: Payer: Self-pay | Admitting: Family Medicine

## 2023-10-08 VITALS — BP 112/72 | HR 85 | Temp 98.1°F | Resp 14 | Ht 64.0 in | Wt 143.8 lb

## 2023-10-08 DIAGNOSIS — K581 Irritable bowel syndrome with constipation: Secondary | ICD-10-CM | POA: Diagnosis not present

## 2023-10-08 DIAGNOSIS — M5382 Other specified dorsopathies, cervical region: Secondary | ICD-10-CM

## 2023-10-08 DIAGNOSIS — H6123 Impacted cerumen, bilateral: Secondary | ICD-10-CM | POA: Diagnosis not present

## 2023-10-08 DIAGNOSIS — R29898 Other symptoms and signs involving the musculoskeletal system: Secondary | ICD-10-CM

## 2023-10-08 DIAGNOSIS — H938X3 Other specified disorders of ear, bilateral: Secondary | ICD-10-CM

## 2023-10-20 ENCOUNTER — Ambulatory Visit
Admission: RE | Admit: 2023-10-20 | Discharge: 2023-10-20 | Disposition: A | Discharge status: Home / Self Care | Payer: 59 | Source: Ambulatory Visit | Attending: Family Medicine | Admitting: Family Medicine

## 2023-10-20 ENCOUNTER — Other Ambulatory Visit
Admission: RE | Admit: 2023-10-20 | Discharge: 2023-10-20 | Disposition: A | Discharge status: Home / Self Care | Payer: 59 | Source: Ambulatory Visit | Attending: Family Medicine | Admitting: Family Medicine

## 2023-10-20 DIAGNOSIS — F250 Schizoaffective disorder, bipolar type: Secondary | ICD-10-CM

## 2023-10-20 DIAGNOSIS — Z1231 Encounter for screening mammogram for malignant neoplasm of breast: Secondary | ICD-10-CM | POA: Diagnosis present

## 2023-10-20 DIAGNOSIS — Z79899 Other long term (current) drug therapy: Secondary | ICD-10-CM | POA: Diagnosis present

## 2023-10-20 LAB — CBC WITH DIFFERENTIAL/PLATELET
Abs Immature Granulocytes: 0.02 10*3/uL (ref 0.00–0.07)
Basophils Absolute: 0.1 10*3/uL (ref 0.0–0.1)
Basophils Relative: 1 %
Eosinophils Absolute: 0.3 10*3/uL (ref 0.0–0.5)
Eosinophils Relative: 3 %
HCT: 36.3 % (ref 36.0–46.0)
Hemoglobin: 11.4 g/dL — ABNORMAL LOW (ref 12.0–15.0)
Immature Granulocytes: 0 %
Lymphocytes Relative: 33 %
Lymphs Abs: 2.7 10*3/uL (ref 0.7–4.0)
MCH: 30.4 pg (ref 26.0–34.0)
MCHC: 31.4 g/dL (ref 30.0–36.0)
MCV: 96.8 fL (ref 80.0–100.0)
Monocytes Absolute: 0.5 10*3/uL (ref 0.1–1.0)
Monocytes Relative: 6 %
Neutro Abs: 4.8 10*3/uL (ref 1.7–7.7)
Neutrophils Relative %: 57 %
Platelets: 240 10*3/uL (ref 150–400)
RBC: 3.75 MIL/uL — ABNORMAL LOW (ref 3.87–5.11)
RDW: 12.4 % (ref 11.5–15.5)
WBC: 8.3 10*3/uL (ref 4.0–10.5)
nRBC: 0 % (ref 0.0–0.2)

## 2023-10-20 LAB — LITHIUM LEVEL: Lithium Lvl: 0.63 mmol/L (ref 0.60–1.20)

## 2023-11-03 NOTE — Progress Notes (Signed)
Name: Carla Thompson   MRN: [id number]    DOB: 11-14-1961   Date:11/11/2023       Progress Note  Subjective  Chief Complaint  Chief Complaint  Patient presents with   Medical Management of Chronic Issues    HPI  Discussed the use of AI scribe software for clinical note transcription with the patient, who gave verbal consent to proceed.  History of Present Illness   The patient, with a history of osteopenia, anemia, hyperlipidemia, and chronic constipation, presents for a regular follow-up. She reports improved hearing following an ear lavage during her last visit. She has been adhering to a high calcium diet and vitamin D supplementation as advised for her osteopenia. However, her vitamin D levels have risen towards the high end of normal, suggesting a need for dosage adjustment.  The patient also reports a persistent issue with thick toenails, which she has attempted to manage with regular trimming. She has been experiencing chronic constipation, managed with Trulance and occasional lactulose. However, she reports an incident of severe constipation followed by diarrhea, likely due to overuse of lactulose. She also notes occasional presence of small amounts of red substance on toilet paper after bowel movements, which she is unsure if it is blood. Colonoscopy is up to date .  She has been experiencing involuntary foot movements, particularly when standing for extended periods, such as during cooking or at church. This symptom has been present for a long time and she suspects it may be a side effect of her clozapine medication for schizophrenia. She also reports neck muscle weakness and back pain, for which she has been receiving chiropractic care every two weeks.   Iron deficiency anemia, last levels improved  The patient has been managing her hyperlipidemia with dietary changes. She also mentions a dry mouth, which she suspects may be a side effect of her medication. She has been managing  this symptom with over-the-counter dry mouth pills, that is also a chronic problem and stable         Patient Active Problem List   Diagnosis Date Noted   Chronic bilateral low back pain without sciatica 11/11/2023   Osteopenia of multiple sites 11/11/2023   Neck muscle weakness 11/11/2023   Muscular deconditioning 11/11/2023   Vitamin D deficiency 05/15/2022   Dyslipidemia 11/20/2017   GERD without esophagitis 02/21/2017   Tachycardia 02/20/2016   RLS (restless legs syndrome) 12/21/2015   Irritable bowel syndrome with constipation 12/21/2015   Other long term (current) drug therapy 12/08/2015   Chronic constipation 12/08/2015   Insomnia, persistent 12/08/2015   Dermatitis, eczematoid 12/08/2015   Gravida 2 para 2 12/08/2015   Hypertriglyceridemia 12/08/2015   Metallic taste 12/08/2015   Female climacteric state 12/08/2015   Schizoaffective disorder, bipolar type (HCC) 12/08/2015    Past Surgical History:  Procedure Laterality Date   ANUS SURGERY  2000   [place]   COLONOSCOPY     COLONOSCOPY WITH PROPOFOL N/A 11/30/2020   Procedure: COLONOSCOPY WITH PROPOFOL;  Surgeon: Wyline Mood, MD;  Location: [facility] ENDOSCOPY;  Service: Gastroenterology;  Laterality: N/A;   TUBAL LIGATION  12/02/1994    Family History  Problem Relation Age of Onset   Mental illness Sister        Schizophrenia and bipolar   Cancer Maternal Grandmother        Colon   Mental illness Paternal Grandmother        Schizophrenia   Breast cancer Neg Hx     Social History  Tobacco Use   Smoking status: Never   Smokeless tobacco: Never  Substance Use Topics   Alcohol use: No    Alcohol/week: 0.0 standard drinks of alcohol     Current Outpatient Medications:    Ascorbic Acid (VITAMIN C) 500 MG CHEW, Chew by mouth., Disp: , Rfl:    Calcium Carbonate-Vit D-Min (CALCIUM 1200 PO), Take by mouth., Disp: , Rfl:    clonazePAM (KLONOPIN) 1 MG tablet, TAKE 1/2 TABLET( 0.5 MG TOTAL) BY MOUTH IN THE  MORNING, AT NOON AND AT BEDTIME., Disp: 45 tablet, Rfl: 5   cloZAPine (CLOZARIL) 100 MG tablet, 4 tabs HS, Disp: 120 tablet, Rfl: 6   famotidine (PEPCID) 40 MG tablet, TAKE 1 TABLET(40 MG) BY MOUTH DAILY, Disp: 90 tablet, Rfl: 1   lactulose (CHRONULAC) 10 GM/15ML solution, Take 45 mLs (30 g total) by mouth 2 (two) times daily as needed for mild constipation., Disp: 946 mL, Rfl: 11   lithium 300 MG tablet, TAKE 2 TABLETS(600 MG) BY MOUTH AT BEDTIME, Disp: 180 tablet, Rfl: 1   Magnesium 400 MG CAPS, Take by mouth., Disp: , Rfl:    Plecanatide (TRULANCE) 3 MG TABS, Take 1 tablet (3 mg total) by mouth daily., Disp: 90 tablet, Rfl: 2   Vitamin D, Ergocalciferol, (DRISDOL) 1.25 MG (50000 UNIT) CAPS capsule, TAKE 1 CAPSULE BY MOUTH EVERY 7 DAYS, Disp: 12 capsule, Rfl: 1   zinc gluconate 50 MG tablet, Take 50 mg by mouth daily., Disp: , Rfl:    metoprolol tartrate (LOPRESSOR) 25 MG tablet, Take 0.5 tablets (12.5 mg total) by mouth 2 (two) times daily., Disp: 90 tablet, Rfl: 1  Allergies  Allergen Reactions   Levsin [Hyoscyamine Sulfate] Hives   Escitalopram Hives    I personally reviewed active problem list, medication list, allergies, family history, social history with the patient/caregiver today.   ROS  Ten systems reviewed and is negative except as mentioned in HPI    Objective  Vitals:   11/11/23 1511  BP: 116/74  Pulse: 85  Resp: 16  Temp: 97.9 F (36.6 C)  SpO2: 98%    Body mass index is 24.65 kg/m.  Physical Exam  Constitutional: Patient appears well-developed and well-nourished.  No distress.  HEENT: head atraumatic, normocephalic, pupils equal and reactive to light, neck supple but looks down, has difficulty supporting her head up  Cardiovascular: Normal rate, regular rhythm and normal heart sounds.  No murmur heard. No BLE edema. Pulmonary/Chest: Effort normal and breath sounds normal. No respiratory distress. Abdominal: Soft.  There is no tenderness. Nails: thick but  not signs of fungal infection  Psychiatric: Patient has a normal mood and affect. behavior is normal. Judgment and thought content normal.   Recent Results (from the past 2160 hour(s))  CBC with Differential/Platelet     Status: Abnormal   Collection Time: 08/18/23 11:54 AM  Result Value Ref Range   WBC 7.7 4.0 - 10.5 K/uL   RBC 3.85 (L) 3.87 - 5.11 MIL/uL   Hemoglobin 11.8 (L) 12.0 - 15.0 g/dL   HCT 28.4 13.2 - 44.0 %   MCV 95.8 80.0 - 100.0 fL   MCH 30.6 26.0 - 34.0 pg   MCHC 32.0 30.0 - 36.0 g/dL   RDW 10.2 72.5 - 36.6 %   Platelets 223 150 - 400 K/uL   nRBC 0.0 0.0 - 0.2 %   Neutrophils Relative % 61 %   Neutro Abs 4.7 1.7 - 7.7 K/uL   Lymphocytes Relative 28 %  Lymphs Abs 2.1 0.7 - 4.0 K/uL   Monocytes Relative 6 %   Monocytes Absolute 0.4 0.1 - 1.0 K/uL   Eosinophils Relative 4 %   Eosinophils Absolute 0.3 0.0 - 0.5 K/uL   Basophils Relative 1 %   Basophils Absolute 0.1 0.0 - 0.1 K/uL   Immature Granulocytes 0 %   Abs Immature Granulocytes 0.02 0.00 - 0.07 K/uL    Comment: Performed at [facility], [address] [place], [place] [address]  Lithium level     Status: Abnormal   Collection Time: 08/18/23 11:54 AM  Result Value Ref Range   Lithium Lvl 0.49 (L) 0.60 - 1.20 mmol/L    Comment: Performed at [facility], [address] [place], [place] [address]  CBC with Differential/Platelet     Status: None   Collection Time: 09/22/23 11:50 AM  Result Value Ref Range   WBC 8.3 4.0 - 10.5 K/uL   RBC 3.92 3.87 - 5.11 MIL/uL   Hemoglobin 12.0 12.0 - 15.0 g/dL   HCT 82.9 56.2 - 13.0 %   MCV 95.9 80.0 - 100.0 fL   MCH 30.6 26.0 - 34.0 pg   MCHC 31.9 30.0 - 36.0 g/dL   RDW 86.5 78.4 - 69.6 %   Platelets 221 150 - 400 K/uL   nRBC 0.0 0.0 - 0.2 %   Neutrophils Relative % 60 %   Neutro Abs 5.0 1.7 - 7.7 K/uL   Lymphocytes Relative 30 %   Lymphs Abs 2.5 0.7 - 4.0 K/uL   Monocytes Relative 6 %   Monocytes Absolute 0.5 0.1 - 1.0 K/uL   Eosinophils  Relative 3 %   Eosinophils Absolute 0.2 0.0 - 0.5 K/uL   Basophils Relative 1 %   Basophils Absolute 0.1 0.0 - 0.1 K/uL   Immature Granulocytes 0 %   Abs Immature Granulocytes 0.02 0.00 - 0.07 K/uL    Comment: Performed at [facility], [address] [place], [place] [address]  Lithium level     Status: None   Collection Time: 09/22/23 11:50 AM  Result Value Ref Range   Lithium Lvl 0.62 0.60 - 1.20 mmol/L    Comment: Performed at [facility], [address] [place], [place] [address]  CBC with Differential/Platelet     Status: Abnormal   Collection Time: 10/20/23  4:01 PM  Result Value Ref Range   WBC 8.3 4.0 - 10.5 K/uL   RBC 3.75 (L) 3.87 - 5.11 MIL/uL   Hemoglobin 11.4 (L) 12.0 - 15.0 g/dL   HCT 40.1 02.7 - 25.3 %   MCV 96.8 80.0 - 100.0 fL   MCH 30.4 26.0 - 34.0 pg   MCHC 31.4 30.0 - 36.0 g/dL   RDW 66.4 40.3 - 47.4 %   Platelets 240 150 - 400 K/uL   nRBC 0.0 0.0 - 0.2 %   Neutrophils Relative % 57 %   Neutro Abs 4.8 1.7 - 7.7 K/uL   Lymphocytes Relative 33 %   Lymphs Abs 2.7 0.7 - 4.0 K/uL   Monocytes Relative 6 %   Monocytes Absolute 0.5 0.1 - 1.0 K/uL   Eosinophils Relative 3 %   Eosinophils Absolute 0.3 0.0 - 0.5 K/uL   Basophils Relative 1 %   Basophils Absolute 0.1 0.0 - 0.1 K/uL   Immature Granulocytes 0 %   Abs Immature Granulocytes 0.02 0.00 - 0.07 K/uL    Comment: Performed at [facility], [address], [place], [place]  [address]  Lithium level     Status: None   Collection Time: 10/20/23  4:01 PM  Result Value Ref Range   Lithium Lvl 0.63 0.60 - 1.20 mmol/L    Comment: Performed at [facility], [address] [place], [place] [address]      PHQ2/9:    11/11/2023    3:11 PM 05/12/2023    2:57 PM 11/29/2022    3:43 PM 10/31/2022   10:09 AM 08/08/2022    2:17 PM  Depression screen PHQ 2/9  Decreased Interest 0 0 0 0 0  Down, Depressed, Hopeless 0 0 0 0 0  PHQ - 2 Score 0 0 0 0 0  Altered sleeping 0 0  0 0 0  Tired, decreased energy 0 0 0 0 0  Change in appetite 0 0 0 0 0  Feeling bad or failure about yourself  0 0 0 0 0  Trouble concentrating 0 0 0 0 0  Moving slowly or fidgety/restless 0 0 0 0 0  Suicidal thoughts 0 0 0 0 0  PHQ-9 Score 0 0 0 0 0  Difficult doing work/chores Not difficult at all Not difficult at all Not difficult at all Not difficult at all     phq 9 is negative   Fall Risk:    11/11/2023    3:07 PM 10/08/2023    9:39 AM 05/12/2023    2:56 PM 11/29/2022    3:39 PM 10/31/2022   10:17 AM  Fall Risk   Falls in the past year? 0 1 1 0 1  Number falls in past yr: 0 0 1 0 1  Injury with Fall? 0 1 1 0 0  Risk for fall due to : No Fall Risks History of fall(s)   Impaired balance/gait  Follow up Falls prevention discussed;Education provided;Falls evaluation completed Education provided;Falls prevention discussed;Falls evaluation completed   Falls prevention discussed;Education provided;Falls evaluation completed    Assessment & Plan  Assessment and Plan    Vitamin D Supplementation High serum levels of Vitamin D due to over-supplementation. -Reduce Vitamin D intake to 1000-2000 IU daily. -Discontinue prescription Vitamin D and switch to over-the-counter supplement after current prescription runs out.  Hyperlipidemia Improved LDL cholesterol levels. -Continue current management plan.  Iron Deficiency Anemia Improvement noted, possibly due to dietary changes. -Continue high iron diet.  Chronic Constipation/IBS Managed with Trulance and occasional use of lactulose. -Continue current management plan.  Gastroesophageal Reflux Disease (GERD) Managed with daily famotidine. -Continue current management plan.  Muscle Deconditioning/Back Pain Ongoing management with chiropractic care and has completed  physical therapy. -Continue current management plan. Chronic and stable   Thick Toenails No signs of disease or fungal infection. -Recommend regular pedicures  for nail maintenance.  Possible Blood in Stool Noted red dots on toilet paper after wiping. -Provide hemoccult cards to test for presence of blood in stool.  Schizophrenia Managed with clozapine and lithium carbonate. -Continue current management plan.  Follow-up in 6 months.

## 2023-11-11 ENCOUNTER — Ambulatory Visit: Payer: 59 | Admitting: Family Medicine

## 2023-11-11 ENCOUNTER — Encounter: Payer: Self-pay | Admitting: Family Medicine

## 2023-11-11 VITALS — BP 116/74 | HR 85 | Temp 97.9°F | Resp 16 | Ht 64.0 in | Wt 143.6 lb

## 2023-11-11 DIAGNOSIS — F250 Schizoaffective disorder, bipolar type: Secondary | ICD-10-CM

## 2023-11-11 DIAGNOSIS — R29898 Other symptoms and signs involving the musculoskeletal system: Secondary | ICD-10-CM | POA: Diagnosis not present

## 2023-11-11 DIAGNOSIS — Z862 Personal history of diseases of the blood and blood-forming organs and certain disorders involving the immune mechanism: Secondary | ICD-10-CM

## 2023-11-11 DIAGNOSIS — R000 Tachycardia, unspecified: Secondary | ICD-10-CM | POA: Diagnosis not present

## 2023-11-11 DIAGNOSIS — G8929 Other chronic pain: Secondary | ICD-10-CM

## 2023-11-11 DIAGNOSIS — K219 Gastro-esophageal reflux disease without esophagitis: Secondary | ICD-10-CM

## 2023-11-11 DIAGNOSIS — Z23 Encounter for immunization: Secondary | ICD-10-CM

## 2023-11-11 DIAGNOSIS — E785 Hyperlipidemia, unspecified: Secondary | ICD-10-CM

## 2023-11-11 DIAGNOSIS — M5382 Other specified dorsopathies, cervical region: Secondary | ICD-10-CM

## 2023-11-11 DIAGNOSIS — M8589 Other specified disorders of bone density and structure, multiple sites: Secondary | ICD-10-CM | POA: Insufficient documentation

## 2023-11-11 DIAGNOSIS — K581 Irritable bowel syndrome with constipation: Secondary | ICD-10-CM | POA: Diagnosis not present

## 2023-11-11 DIAGNOSIS — K921 Melena: Secondary | ICD-10-CM

## 2023-11-11 DIAGNOSIS — L602 Onychogryphosis: Secondary | ICD-10-CM

## 2023-11-11 DIAGNOSIS — M545 Low back pain, unspecified: Secondary | ICD-10-CM | POA: Insufficient documentation

## 2023-11-11 MED ORDER — METOPROLOL TARTRATE 25 MG PO TABS
12.5000 mg | ORAL_TABLET | Freq: Two times a day (BID) | ORAL | 1 refills | Status: DC
Start: 2023-11-11 — End: 2024-05-10

## 2023-11-14 ENCOUNTER — Telehealth: Payer: Self-pay | Admitting: Family Medicine

## 2023-11-14 NOTE — Telephone Encounter (Signed)
Pt is calling in because she received a call from the pharmacy to refill her Vitamin D medication and pt says she thought Dr. Carlynn Purl told her to discontinue the prescription Vitamin D and continue to take the Vitamin D over the counter. Pt would like to verify if that's what Dr. Carlynn Purl said or if she should get the Vitamin D refilled.

## 2023-11-14 NOTE — Telephone Encounter (Signed)
Pt notified- verbalized understanding.

## 2023-11-14 NOTE — Telephone Encounter (Signed)
Per Note:   Assessment & Plan   Assessment and Plan    Vitamin D Supplementation High serum levels of Vitamin D due to over-supplementation. -Reduce Vitamin D intake to 1000-2000 IU daily. -Discontinue prescription Vitamin D and switch to over-the-counter supplement after current prescription runs out.  So just do OTC?

## 2023-11-18 ENCOUNTER — Encounter: Payer: Self-pay | Admitting: Physician Assistant

## 2023-11-18 ENCOUNTER — Other Ambulatory Visit: Payer: Self-pay | Admitting: Family Medicine

## 2023-11-18 ENCOUNTER — Other Ambulatory Visit
Admission: RE | Admit: 2023-11-18 | Discharge: 2023-11-18 | Disposition: A | Discharge status: Home / Self Care | Payer: 59 | Source: Ambulatory Visit | Attending: Psychiatry | Admitting: Psychiatry

## 2023-11-18 ENCOUNTER — Ambulatory Visit (INDEPENDENT_AMBULATORY_CARE_PROVIDER_SITE_OTHER): Payer: 59 | Admitting: Physician Assistant

## 2023-11-18 VITALS — BP 116/68 | HR 70 | Temp 98.2°F | Resp 16 | Ht 64.0 in | Wt 143.0 lb

## 2023-11-18 DIAGNOSIS — R000 Tachycardia, unspecified: Secondary | ICD-10-CM

## 2023-11-18 DIAGNOSIS — F250 Schizoaffective disorder, bipolar type: Secondary | ICD-10-CM | POA: Insufficient documentation

## 2023-11-18 DIAGNOSIS — J069 Acute upper respiratory infection, unspecified: Secondary | ICD-10-CM

## 2023-11-18 DIAGNOSIS — Z79899 Other long term (current) drug therapy: Secondary | ICD-10-CM | POA: Insufficient documentation

## 2023-11-18 LAB — CBC WITH DIFFERENTIAL/PLATELET
Abs Immature Granulocytes: 0.01 10*3/uL (ref 0.00–0.07)
Basophils Absolute: 0 10*3/uL (ref 0.0–0.1)
Basophils Relative: 0 %
Eosinophils Absolute: 0 10*3/uL (ref 0.0–0.5)
Eosinophils Relative: 1 %
HCT: 36.3 % (ref 36.0–46.0)
Hemoglobin: 11.7 g/dL — ABNORMAL LOW (ref 12.0–15.0)
Immature Granulocytes: 0 %
Lymphocytes Relative: 28 %
Lymphs Abs: 1.7 10*3/uL (ref 0.7–4.0)
MCH: 30.5 pg (ref 26.0–34.0)
MCHC: 32.2 g/dL (ref 30.0–36.0)
MCV: 94.8 fL (ref 80.0–100.0)
Monocytes Absolute: 0.6 10*3/uL (ref 0.1–1.0)
Monocytes Relative: 10 %
Neutro Abs: 3.7 10*3/uL (ref 1.7–7.7)
Neutrophils Relative %: 61 %
Platelets: 195 10*3/uL (ref 150–400)
RBC: 3.83 MIL/uL — ABNORMAL LOW (ref 3.87–5.11)
RDW: 12.5 % (ref 11.5–15.5)
WBC: 6.1 10*3/uL (ref 4.0–10.5)
nRBC: 0 % (ref 0.0–0.2)

## 2023-11-18 LAB — LITHIUM LEVEL: Lithium Lvl: 0.5 mmol/L — ABNORMAL LOW (ref 0.60–1.20)

## 2023-11-18 NOTE — Progress Notes (Unsigned)
Acute Office Visit   Patient: Carla Thompson   DOB: 01-Sep-1961   62 y.o. Female  MRN: [id number] Visit Date: 11/18/2023  Today's healthcare provider: Oswaldo Conroy Preston Garabedian, PA-C  Introduced myself to the patient as a Secondary school teacher and provided education on APPs in clinical practice.    Chief Complaint  Patient presents with   Cough    x1 week, productive. Taking OTC Vit C and Zinc   Excessive Sweating   Subjective    HPI HPI     Cough    Additional comments: x1 week, productive. Taking OTC Vit C and Zinc      Last edited by Dollene Primrose, CMA on 11/18/2023  2:52 PM.      URI -type symptoms  Onset: sudden  Duration: started on Saturday (11/15/23) and progressed on Sunday  Associated symptoms: productive cough, diaphoresis, subjective fever  Intervention: She has been taking Echinacea, vitamin C    Recent sick contacts: She reports she has had sick contact from companion  Recent travel: none  COVID testing at home: has not tested at home   Result:NA   Medications: Outpatient Medications Prior to Visit  Medication Sig   Ascorbic Acid (VITAMIN C) 500 MG CHEW Chew by mouth.   Calcium Carbonate-Vit D-Min (CALCIUM 1200 PO) Take by mouth.   clonazePAM (KLONOPIN) 1 MG tablet TAKE 1/2 TABLET( 0.5 MG TOTAL) BY MOUTH IN THE MORNING, AT NOON AND AT BEDTIME.   cloZAPine (CLOZARIL) 100 MG tablet 4 tabs HS   famotidine (PEPCID) 40 MG tablet TAKE 1 TABLET(40 MG) BY MOUTH DAILY   lactulose (CHRONULAC) 10 GM/15ML solution Take 45 mLs (30 g total) by mouth 2 (two) times daily as needed for mild constipation.   lithium 300 MG tablet TAKE 2 TABLETS(600 MG) BY MOUTH AT BEDTIME   Magnesium 400 MG CAPS Take by mouth.   metoprolol tartrate (LOPRESSOR) 25 MG tablet Take 0.5 tablets (12.5 mg total) by mouth 2 (two) times daily.   Plecanatide (TRULANCE) 3 MG TABS Take 1 tablet (3 mg total) by mouth daily.   Vitamin D, Ergocalciferol, (DRISDOL) 1.25 MG (50000 UNIT) CAPS capsule TAKE 1 CAPSULE  BY MOUTH EVERY 7 DAYS   zinc gluconate 50 MG tablet Take 50 mg by mouth daily.   No facility-administered medications prior to visit.    Review of Systems  Constitutional:  Positive for chills, diaphoresis, fatigue and fever.  HENT:  Positive for congestion (ongoing for about 2 weeks). Negative for ear pain, postnasal drip and sore throat.   Respiratory:  Positive for cough. Negative for shortness of breath and wheezing.   Gastrointestinal:  Positive for diarrhea. Negative for nausea and vomiting.  Musculoskeletal:  Negative for myalgias.  Neurological:  Negative for dizziness and headaches.    {Insert previous labs (optional):23779} {See past labs  Heme  Chem  Endocrine  Serology  Results Review (optional):1}   Objective    BP 116/68   Pulse 70   Temp 98.2 F (36.8 C) (Oral)   Resp 16   Ht 5\' 4"  (1.626 m)   Wt 143 lb (64.9 kg)   LMP 12/16/2016 (Approximate)   BMI 24.55 kg/m  {Insert last BP/Wt (optional):23777}{See vitals history (optional):1}   Physical Exam Vitals reviewed.  Constitutional:      General: She is awake.     Appearance: Normal appearance. She is well-developed and well-groomed.  HENT:     Head: Normocephalic and atraumatic.  Neurological:  Mental Status: She is alert.  Psychiatric:        Behavior: Behavior is cooperative.       No results found for any visits on 11/18/23.  Assessment & Plan      No follow-ups on file.      Problem List Items Addressed This Visit   None Visit Diagnoses       Upper respiratory tract infection, unspecified type    -  Primary        No follow-ups on file.   I, Eryn Krejci E Yoshika Vensel, PA-C, have reviewed all documentation for this visit. The documentation on 11/18/23 for the exam, diagnosis, procedures, and orders are all accurate and complete.   Jacquelin Hawking, MHS, PA-C [facility]

## 2023-11-18 NOTE — Patient Instructions (Signed)

## 2023-12-15 ENCOUNTER — Encounter: Payer: Self-pay | Admitting: Family Medicine

## 2023-12-15 ENCOUNTER — Ambulatory Visit (INDEPENDENT_AMBULATORY_CARE_PROVIDER_SITE_OTHER): Payer: 59 | Admitting: Family Medicine

## 2023-12-15 VITALS — BP 128/74 | HR 95 | Temp 98.3°F | Resp 16 | Ht 64.0 in | Wt 142.8 lb

## 2023-12-15 DIAGNOSIS — B9689 Other specified bacterial agents as the cause of diseases classified elsewhere: Secondary | ICD-10-CM | POA: Diagnosis not present

## 2023-12-15 DIAGNOSIS — R058 Other specified cough: Secondary | ICD-10-CM

## 2023-12-15 DIAGNOSIS — J069 Acute upper respiratory infection, unspecified: Secondary | ICD-10-CM | POA: Diagnosis not present

## 2023-12-15 MED ORDER — AZITHROMYCIN 250 MG PO TABS
ORAL_TABLET | ORAL | 0 refills | Status: AC
Start: 2023-12-15 — End: 2023-12-20

## 2023-12-15 MED ORDER — BENZONATATE 100 MG PO CAPS: 100.0000 mg | ORAL_CAPSULE | Freq: Two times a day (BID) | ORAL | 0 refills | Status: DC | PRN

## 2023-12-15 NOTE — Progress Notes (Signed)
 Name: Carla Thompson   MRN: [id number]    DOB: [birth year]   Date:12/15/2023       Progress Note  Subjective  Chief Complaint  Chief Complaint  Patient presents with   Cough    Phlegm as well, pt has this on going about a month ago. Pt states it comes and go. Pt spouse also sick and was given medication at his walk in appointment this past sat   Nasal Congestion    HPI  Discussed the use of AI scribe software for clinical note transcription with the patient, who gave verbal consent to proceed.  History of Present Illness   The patient, with a history of chronic gastrointestinal issues, presents with a prolonged upper respiratory infection that began in mid-December. She initially experienced normal cold symptoms , she was seen by Rocky Mecum in our office, took otc medications, felt better for a period of time, but since last week symptoms have been severe with  nasal congestion, severe coughing, and significant postnasal drainage, which she describes as a 'wet cough.' She is also feeling tired and has chills.  She is not sure if fever since no thermometer at home          Patient Active Problem List   Diagnosis Date Noted   Chronic bilateral low back pain without sciatica 11/11/2023   Osteopenia of multiple sites 11/11/2023   Neck muscle weakness 11/11/2023   Muscular deconditioning 11/11/2023   Vitamin D  deficiency 05/15/2022   Dyslipidemia 11/20/2017   GERD without esophagitis 02/21/2017   Tachycardia 02/20/2016   RLS (restless legs syndrome) 12/21/2015   Irritable bowel syndrome with constipation 12/21/2015   Other long term (current) drug therapy 12/08/2015   Chronic constipation 12/08/2015   Insomnia, persistent 12/08/2015   Dermatitis, eczematoid 12/08/2015   Gravida 2 para 2 12/08/2015   Hypertriglyceridemia 12/08/2015   Metallic taste 12/08/2015   Female climacteric state 12/08/2015   Schizoaffective disorder, bipolar type (HCC) 12/08/2015    Social History    Tobacco Use   Smoking status: Never   Smokeless tobacco: Never  Substance Use Topics   Alcohol use: No    Alcohol/week: 0.0 standard drinks of alcohol     Current Outpatient Medications:    Ascorbic Acid (VITAMIN C) 500 MG CHEW, Chew by mouth., Disp: , Rfl:    Calcium Carbonate-Vit D-Min (CALCIUM 1200 PO), Take by mouth., Disp: , Rfl:    clonazePAM  (KLONOPIN ) 1 MG tablet, TAKE 1/2 TABLET( 0.5 MG TOTAL) BY MOUTH IN THE MORNING, AT NOON AND AT BEDTIME., Disp: 45 tablet, Rfl: 5   cloZAPine  (CLOZARIL ) 100 MG tablet, 4 tabs HS, Disp: 120 tablet, Rfl: 6   famotidine  (PEPCID ) 40 MG tablet, TAKE 1 TABLET(40 MG) BY MOUTH DAILY, Disp: 90 tablet, Rfl: 1   lactulose  (CHRONULAC ) 10 GM/15ML solution, Take 45 mLs (30 g total) by mouth 2 (two) times daily as needed for mild constipation., Disp: 946 mL, Rfl: 11   lithium  300 MG tablet, TAKE 2 TABLETS(600 MG) BY MOUTH AT BEDTIME, Disp: 180 tablet, Rfl: 1   Magnesium  400 MG CAPS, Take by mouth., Disp: , Rfl:    metoprolol  tartrate (LOPRESSOR ) 25 MG tablet, Take 0.5 tablets (12.5 mg total) by mouth 2 (two) times daily., Disp: 90 tablet, Rfl: 1   Plecanatide  (TRULANCE ) 3 MG TABS, Take 1 tablet (3 mg total) by mouth daily., Disp: 90 tablet, Rfl: 2   Vitamin D , Ergocalciferol , (DRISDOL ) 1.25 MG (50000 UNIT) CAPS capsule, TAKE 1 CAPSULE BY  MOUTH EVERY 7 DAYS, Disp: 12 capsule, Rfl: 1   zinc gluconate 50 MG tablet, Take 50 mg by mouth daily., Disp: , Rfl:   Allergies  Allergen Reactions   Levsin [Hyoscyamine Sulfate] Hives   Escitalopram Hives    ROS  Ten systems reviewed and is negative except as mentioned in HPI    Objective  Vitals:   12/15/23 1357  BP: 128/74  Pulse: 95  Resp: 16  Temp: 98.3 F (36.8 C)  TempSrc: Oral  SpO2: 100%  Weight: 142 lb 12.8 oz (64.8 kg)  Height: 5' 4 (1.626 m)    Body mass index is 24.51 kg/m.    Physical Exam  Constitutional: Patient appears well-developed and well-nourished.  No distress.  HEENT:  head atraumatic, normocephalic, pupils equal and reactive to light,  neck supple, throat within normal limits Cardiovascular: Normal rate, regular rhythm and normal heart sounds.  No murmur heard. No BLE edema. Pulmonary/Chest: Effort normal and breath sounds normal. No respiratory distress. Abdominal: Soft.  There is no tenderness. Psychiatric: Patient has a normal mood and affect. behavior is normal. Judgment and thought content normal.   Recent Results (from the past 2160 hours)  CBC with Differential/Platelet     Status: None   Collection Time: 09/22/23 11:50 AM  Result Value Ref Range   WBC 8.3 4.0 - 10.5 K/uL   RBC 3.92 3.87 - 5.11 MIL/uL   Hemoglobin 12.0 12.0 - 15.0 g/dL   HCT 62.3 63.9 - 53.9 %   MCV 95.9 80.0 - 100.0 fL   MCH 30.6 26.0 - 34.0 pg   MCHC 31.9 30.0 - 36.0 g/dL   RDW 87.6 88.4 - 84.4 %   Platelets 221 150 - 400 K/uL   nRBC 0.0 0.0 - 0.2 %   Neutrophils Relative % 60 %   Neutro Abs 5.0 1.7 - 7.7 K/uL   Lymphocytes Relative 30 %   Lymphs Abs 2.5 0.7 - 4.0 K/uL   Monocytes Relative 6 %   Monocytes Absolute 0.5 0.1 - 1.0 K/uL   Eosinophils Relative 3 %   Eosinophils Absolute 0.2 0.0 - 0.5 K/uL   Basophils Relative 1 %   Basophils Absolute 0.1 0.0 - 0.1 K/uL   Immature Granulocytes 0 %   Abs Immature Granulocytes 0.02 0.00 - 0.07 K/uL    Comment: Performed at [facility], [address] [place], [place] [address]  Lithium  level     Status: None   Collection Time: 09/22/23 11:50 AM  Result Value Ref Range   Lithium  Lvl 0.62 0.60 - 1.20 mmol/L    Comment: Performed at [facility], [address] [place], [place] [address]  CBC with Differential/Platelet     Status: Abnormal   Collection Time: 10/20/23  4:01 PM  Result Value Ref Range   WBC 8.3 4.0 - 10.5 K/uL   RBC 3.75 (L) 3.87 - 5.11 MIL/uL   Hemoglobin 11.4 (L) 12.0 - 15.0 g/dL   HCT 63.6 63.9 - 53.9 %   MCV 96.8 80.0 - 100.0 fL   MCH 30.4 26.0 - 34.0 pg   MCHC 31.4 30.0 - 36.0  g/dL   RDW 87.5 88.4 - 84.4 %   Platelets 240 150 - 400 K/uL   nRBC 0.0 0.0 - 0.2 %   Neutrophils Relative % 57 %   Neutro Abs 4.8 1.7 - 7.7 K/uL   Lymphocytes Relative 33 %   Lymphs Abs 2.7 0.7 - 4.0 K/uL   Monocytes Relative  6 %   Monocytes Absolute 0.5 0.1 - 1.0 K/uL   Eosinophils Relative 3 %   Eosinophils Absolute 0.3 0.0 - 0.5 K/uL   Basophils Relative 1 %   Basophils Absolute 0.1 0.0 - 0.1 K/uL   Immature Granulocytes 0 %   Abs Immature Granulocytes 0.02 0.00 - 0.07 K/uL    Comment: Performed at [facility], [address] [place], [place] [address]  Lithium  level     Status: None   Collection Time: 10/20/23  4:01 PM  Result Value Ref Range   Lithium  Lvl 0.63 0.60 - 1.20 mmol/L    Comment: Performed at [facility], [address] [place], [place] [address]  CBC with Differential/Platelet     Status: Abnormal   Collection Time: 11/18/23  3:59 PM  Result Value Ref Range   WBC 6.1 4.0 - 10.5 K/uL   RBC 3.83 (L) 3.87 - 5.11 MIL/uL   Hemoglobin 11.7 (L) 12.0 - 15.0 g/dL   HCT 63.6 63.9 - 53.9 %   MCV 94.8 80.0 - 100.0 fL   MCH 30.5 26.0 - 34.0 pg   MCHC 32.2 30.0 - 36.0 g/dL   RDW 87.4 88.4 - 84.4 %   Platelets 195 150 - 400 K/uL   nRBC 0.0 0.0 - 0.2 %   Neutrophils Relative % 61 %   Neutro Abs 3.7 1.7 - 7.7 K/uL   Lymphocytes Relative 28 %   Lymphs Abs 1.7 0.7 - 4.0 K/uL   Monocytes Relative 10 %   Monocytes Absolute 0.6 0.1 - 1.0 K/uL   Eosinophils Relative 1 %   Eosinophils Absolute 0.0 0.0 - 0.5 K/uL   Basophils Relative 0 %   Basophils Absolute 0.0 0.0 - 0.1 K/uL   Immature Granulocytes 0 %   Abs Immature Granulocytes 0.01 0.00 - 0.07 K/uL    Comment: Performed at [facility], [address] [place], [place] [address]  Lithium  level     Status: Abnormal   Collection Time: 11/18/23  3:59 PM  Result Value Ref Range   Lithium  Lvl 0.50 (L) 0.60 - 1.20 mmol/L    Comment: Performed at [facility], [address] [place], [place] [address]     Assessment & Plan    Upper Respiratory Infection Persistent symptoms of nasal congestion, cough, and postnasal drip since December 17th. Initial improvement with over-the-counter medications, but symptoms have worsened over the past week. Cough is productive now  No fever reported, but patient reports chills and fatigue. -we will give her a Zpcak for suspected  bacterial infection due to prolonged symptoms. -Continue Tessalon  Perles for cough. -Continue over-the-counter saline spray for nasal congestion. -If symptoms do not improve, patient to notify the office.

## 2023-12-22 ENCOUNTER — Other Ambulatory Visit
Admission: RE | Admit: 2023-12-22 | Discharge: 2023-12-22 | Disposition: A | Discharge status: Home / Self Care | Payer: 59 | Attending: Psychiatry | Admitting: Psychiatry

## 2023-12-22 ENCOUNTER — Telehealth: Payer: Self-pay | Admitting: Family Medicine

## 2023-12-22 DIAGNOSIS — F250 Schizoaffective disorder, bipolar type: Secondary | ICD-10-CM

## 2023-12-22 DIAGNOSIS — Z79899 Other long term (current) drug therapy: Secondary | ICD-10-CM | POA: Diagnosis present

## 2023-12-22 LAB — CBC WITH DIFFERENTIAL/PLATELET
Abs Immature Granulocytes: 0.04 10*3/uL (ref 0.00–0.07)
Basophils Absolute: 0.1 10*3/uL (ref 0.0–0.1)
Basophils Relative: 1 %
Eosinophils Absolute: 0.3 10*3/uL (ref 0.0–0.5)
Eosinophils Relative: 3 %
HCT: 38 % (ref 36.0–46.0)
Hemoglobin: 12.4 g/dL (ref 12.0–15.0)
Immature Granulocytes: 0 %
Lymphocytes Relative: 22 %
Lymphs Abs: 2.4 10*3/uL (ref 0.7–4.0)
MCH: 30.7 pg (ref 26.0–34.0)
MCHC: 32.6 g/dL (ref 30.0–36.0)
MCV: 94.1 fL (ref 80.0–100.0)
Monocytes Absolute: 0.6 10*3/uL (ref 0.1–1.0)
Monocytes Relative: 5 %
Neutro Abs: 7.3 10*3/uL (ref 1.7–7.7)
Neutrophils Relative %: 69 %
Platelets: 260 10*3/uL (ref 150–400)
RBC: 4.04 MIL/uL (ref 3.87–5.11)
RDW: 12.3 % (ref 11.5–15.5)
WBC: 10.6 10*3/uL — ABNORMAL HIGH (ref 4.0–10.5)
nRBC: 0 % (ref 0.0–0.2)

## 2023-12-22 LAB — LITHIUM LEVEL: Lithium Lvl: 0.68 mmol/L (ref 0.60–1.20)

## 2023-12-22 NOTE — Telephone Encounter (Signed)
Pt is calling in because she had an appointment with Dr. Carlynn Purl and she wants to know what her diagnosis is and if she is contagious. Please follow up with pt.

## 2023-12-22 NOTE — Telephone Encounter (Signed)
Called pt back and informed her she was dx with URI. I advised to make sure she wears a mask to avoid contaminations to other. Pt asked about COVID and advised she did not have COVID or FLU. Pt verbalized understanding

## 2024-01-12 ENCOUNTER — Encounter: Payer: Self-pay | Admitting: Dermatology

## 2024-01-12 ENCOUNTER — Ambulatory Visit (INDEPENDENT_AMBULATORY_CARE_PROVIDER_SITE_OTHER): Payer: 59 | Admitting: Dermatology

## 2024-01-12 ENCOUNTER — Other Ambulatory Visit: Payer: Self-pay | Admitting: Psychiatry

## 2024-01-12 DIAGNOSIS — L820 Inflamed seborrheic keratosis: Secondary | ICD-10-CM

## 2024-01-12 DIAGNOSIS — L578 Other skin changes due to chronic exposure to nonionizing radiation: Secondary | ICD-10-CM

## 2024-01-12 DIAGNOSIS — D1801 Hemangioma of skin and subcutaneous tissue: Secondary | ICD-10-CM

## 2024-01-12 DIAGNOSIS — L814 Other melanin hyperpigmentation: Secondary | ICD-10-CM

## 2024-01-12 DIAGNOSIS — D229 Melanocytic nevi, unspecified: Secondary | ICD-10-CM

## 2024-01-12 DIAGNOSIS — W908XXA Exposure to other nonionizing radiation, initial encounter: Secondary | ICD-10-CM | POA: Diagnosis not present

## 2024-01-12 DIAGNOSIS — Z86018 Personal history of other benign neoplasm: Secondary | ICD-10-CM

## 2024-01-12 DIAGNOSIS — L821 Other seborrheic keratosis: Secondary | ICD-10-CM

## 2024-01-12 DIAGNOSIS — F4001 Agoraphobia with panic disorder: Secondary | ICD-10-CM

## 2024-01-12 DIAGNOSIS — L853 Xerosis cutis: Secondary | ICD-10-CM

## 2024-01-12 DIAGNOSIS — Z1283 Encounter for screening for malignant neoplasm of skin: Secondary | ICD-10-CM

## 2024-01-12 NOTE — Patient Instructions (Addendum)
 Cerave cream for moisturizer daily   Cryotherapy Aftercare  Wash gently with soap and water everyday.   Apply Vaseline and Band-Aid daily until healed.   Due to recent changes in healthcare laws, you may see results of your pathology and/or laboratory studies on MyChart before the doctors have had a chance to review them. We understand that in some cases there may be results that are confusing or concerning to you. Please understand that not all results are received at the same time and often the doctors may need to interpret multiple results in order to provide you with the best plan of care or course of treatment. Therefore, we ask that you please give us  2 business days to thoroughly review all your results before contacting the office for clarification. Should we see a critical lab result, you will be contacted sooner.   If You Need Anything After Your Visit  If you have any questions or concerns for your doctor, please call our main line at [phone] and press option 4 to reach your doctor's medical assistant. If no one answers, please leave a voicemail as directed and we will return your call as soon as possible. Messages left after 4 pm will be answered the following business day.   You may also send us  a message via MyChart. We typically respond to MyChart messages within 1-2 business days.  For prescription refills, please ask your pharmacy to contact our office. Our fax number is [phone].  If you have an urgent issue when the clinic is closed that cannot wait until the next business day, you can page your doctor at the number below.    Please note that while we do our best to be available for urgent issues outside of office hours, we are not available 24/7.   If you have an urgent issue and are unable to reach us , you may choose to seek medical care at your doctor's office, retail clinic, urgent care center, or emergency room.  If you have a medical emergency, please  immediately call 911 or go to the emergency department.  Pager Numbers  - Dr. Bary Likes: [phone]  - Dr. Annette Barters: [phone]  - Dr. Felipe Horton: [phone]   In the event of inclement weather, please call our main line at [phone] for an update on the status of any delays or closures.  Dermatology Medication Tips: Please keep the boxes that topical medications come in in order to help keep track of the instructions about where and how to use these. Pharmacies typically print the medication instructions only on the boxes and not directly on the medication tubes.   If your medication is too expensive, please contact our office at [phone] option 4 or send us  a message through MyChart.   We are unable to tell what your co-pay for medications will be in advance as this is different depending on your insurance coverage. However, we may be able to find a substitute medication at lower cost or fill out paperwork to get insurance to cover a needed medication.   If a prior authorization is required to get your medication covered by your insurance company, please allow us  1-2 business days to complete this process.  Drug prices often vary depending on where the prescription is filled and some pharmacies may offer cheaper prices.  The website www.goodrx.com contains coupons for medications through different pharmacies. The prices here do not account for what the cost may be with help from insurance (it may be cheaper with  your insurance), but the website can give you the price if you did not use any insurance.  - You can print the associated coupon and take it with your prescription to the pharmacy.  - You may also stop by our office during regular business hours and pick up a GoodRx coupon card.  - If you need your prescription sent electronically to a different pharmacy, notify our office through [facility] or by phone at [phone] option 4.     Si Usted Necesita Algo  Despus de Su Visita  Tambin puede enviarnos un mensaje a travs de Clinical cytogeneticist. Por lo general respondemos a los mensajes de MyChart en el transcurso de 1 a 2 das hbiles.  Para renovar recetas, por favor pida a su farmacia que se ponga en contacto con nuestra oficina. Franz Jacks de fax es [place] [phone].  Si tiene un asunto urgente cuando la clnica est cerrada y que no puede esperar hasta el siguiente da hbil, puede llamar/localizar a su doctor(a) al nmero que aparece a continuacin.   Por favor, tenga en cuenta que aunque hacemos todo lo posible para estar disponibles para asuntos urgentes fuera del horario de [place], no estamos disponibles las 24 horas del da, los 7 [address]  [address] de la [place].   Si tiene un problema urgente y no puede comunicarse con nosotros, puede optar por buscar atencin mdica  en el consultorio de su doctor(a), en una clnica privada, en un centro de atencin urgente o en una sala de emergencias.  Si tiene Engineer, drilling, por favor llame inmediatamente al 911 o vaya a la sala de emergencias.  Nmeros de bper  - Dr. Bary Likes: [phone]  - Dra. Annette Barters: [phone]  - Dr. Felipe Horton: [phone]   En caso de inclemencias del tiempo, por favor llame a Lajuan Pila principal al [phone] para una actualizacin sobre el [place] de cualquier retraso o cierre.  Consejos para la medicacin en dermatologa: Por favor, guarde las cajas en las que vienen los medicamentos de uso tpico para ayudarle a seguir las instrucciones sobre dnde y cmo usarlos. Las farmacias generalmente imprimen las instrucciones del medicamento slo en las cajas y no directamente en los tubos del [place].   Si su medicamento es muy caro, por favor, pngase en contacto con Bettyjane Brunet llamando al [phone] y presione la opcin 4 o envenos un mensaje a travs de Clinical cytogeneticist.   No podemos decirle cul ser su copago por los medicamentos por adelantado ya que esto es diferente  dependiendo de la cobertura de su seguro. Sin embargo, es posible que podamos encontrar un medicamento sustituto a Audiological scientist un formulario para que el seguro cubra el medicamento que se considera necesario.   Si se requiere una autorizacin previa para que su compaa de seguros [place] su medicamento, por favor permtanos de 1 a 2 das hbiles para completar este proceso.  Los precios de los medicamentos varan con frecuencia dependiendo del Environmental consultant de dnde se surte la receta y alguna farmacias pueden ofrecer precios ms baratos.  El sitio web www.goodrx.com tiene cupones para medicamentos de Health and safety inspector. Los precios aqu no tienen en cuenta lo que podra costar con la ayuda del seguro (puede ser ms barato con su seguro), pero el sitio web puede darle el precio si no utiliz Tourist information centre manager.  - Puede imprimir el cupn correspondiente y llevarlo con su receta a la farmacia.  - Tambin puede pasar por nuestra oficina durante el horario de atencin regular y Librarian, academic  tarjeta de cupones de GoodRx.  - Si necesita que su receta se enve electrnicamente a una farmacia diferente, informe a nuestra oficina a travs de MyChart de [place] o por telfono llamando al [phone] y presione la opcin 4.

## 2024-01-12 NOTE — Progress Notes (Signed)
Follow-Up Visit   Subjective  Carla Thompson is a 63 y.o. female who presents for the following: Skin Cancer Screening and Full Body Skin Exam Hx of Dysplastic Nevus, check spot L dorsum hand ~1-2wks, no symtoms  The patient presents for Total-Body Skin Exam (TBSE) for skin cancer screening and mole check. The patient has spots, moles and lesions to be evaluated, some may be new or changing and the patient may have concern these could be cancer.  Patient accompanied by husband who contributes to history.   The following portions of the chart were reviewed this encounter and updated as appropriate: medications, allergies, medical history  Review of Systems:  No other skin or systemic complaints except as noted in HPI or Assessment and Plan.  Objective  Well appearing patient in no apparent distress; mood and affect are within normal limits.  A full examination was performed including scalp, head, eyes, ears, nose, lips, neck, chest, axillae, abdomen, back, buttocks, bilateral upper extremities, bilateral lower extremities, hands, feet, fingers, toes, fingernails, and toenails. All findings within normal limits unless otherwise noted below.   Relevant physical exam findings are noted in the Assessment and Plan.  L temple x 2, L cheek zygoma x 1, R temple x 1 (4) Stuck on waxy paps with erythema L lower eyelid margin x 1 Stuck on waxy paps with erythema   Assessment & Plan   SKIN CANCER SCREENING PERFORMED TODAY.  ACTINIC DAMAGE - Chronic condition, secondary to cumulative UV/sun exposure - diffuse scaly erythematous macules with underlying dyspigmentation - Recommend daily broad spectrum sunscreen SPF 30+ to sun-exposed areas, reapply every 2 hours as needed.  - Staying in the shade or wearing long sleeves, sun glasses (UVA+UVB protection) and wide brim hats (4-inch brim around the entire circumference of the hat) are also recommended for sun protection.  - Call for new or  changing lesions.  LENTIGINES, SEBORRHEIC KERATOSES, HEMANGIOMAS - Benign normal skin lesions - Benign-appearing - Call for any changes  MELANOCYTIC NEVI - Tan-brown and/or pink-flesh-colored symmetric macules and papules - Benign appearing on exam today - Observation - Call clinic for new or changing moles - Recommend daily use of broad spectrum spf 30+ sunscreen to sun-exposed areas.   HISTORY OF DYSPLASTIC NEVUS No evidence of recurrence today Recommend regular full body skin exams Recommend daily broad spectrum sunscreen SPF 30+ to sun-exposed areas, reapply every 2 hours as needed.  Call if any new or changing lesions are noted between office visits  - L pinna ear  Xerosis - diffuse xerotic patches - recommend gentle, hydrating skin care - gentle skin care handout given - Recommend Cerave cream INFLAMED SEBORRHEIC KERATOSIS (4) L temple x 2, L cheek zygoma x 1, R temple x 1 (4) Symptomatic, irritating, patient would like treated. Destruction of lesion - L temple x 2, L cheek zygoma x 1, R temple x 1 (4) Complexity: simple   Destruction method: cryotherapy   Informed consent: discussed and consent obtained   Timeout:  patient name, date of birth, surgical site, and procedure verified Lesion destroyed using liquid nitrogen: Yes   Region frozen until ice ball extended beyond lesion: Yes   Outcome: patient tolerated procedure well with no complications   Post-procedure details: wound care instructions given   SEBORRHEIC KERATOSIS, INFLAMED L lower eyelid margin x 1 Symptomatic, irritating, patient would like treated.  Destruction of lesion - L lower eyelid margin x 1 Complexity: simple   Destruction method: cryotherapy   Informed consent:  discussed and consent obtained   Timeout:  patient name, date of birth, surgical site, and procedure verified Lesion destroyed using liquid nitrogen: Yes   Region frozen until ice ball extended beyond lesion: Yes   Outcome: patient  tolerated procedure well with no complications   Post-procedure details: wound care instructions given   Return in about 1 year (around 01/11/2025) for TBSE, Hx of Dysplastic nevi.  I, Ardis Rowan, RMA, am acting as scribe for Armida Sans, MD .   Documentation: I have reviewed the above documentation for accuracy and completeness, and I agree with the above.  Armida Sans, MD

## 2024-01-13 ENCOUNTER — Encounter: Payer: Self-pay | Admitting: Dermatology

## 2024-01-14 ENCOUNTER — Encounter: Payer: Self-pay | Admitting: Psychiatry

## 2024-01-14 ENCOUNTER — Ambulatory Visit: Payer: 59 | Admitting: Psychiatry

## 2024-01-14 DIAGNOSIS — F4001 Agoraphobia with panic disorder: Secondary | ICD-10-CM | POA: Diagnosis not present

## 2024-01-14 DIAGNOSIS — K5909 Other constipation: Secondary | ICD-10-CM

## 2024-01-14 DIAGNOSIS — Z79899 Other long term (current) drug therapy: Secondary | ICD-10-CM

## 2024-01-14 DIAGNOSIS — F250 Schizoaffective disorder, bipolar type: Secondary | ICD-10-CM

## 2024-01-14 MED ORDER — CLOZAPINE 100 MG PO TABS: ORAL_TABLET | ORAL | 6 refills | Status: DC

## 2024-01-14 MED ORDER — LITHIUM CARBONATE 300 MG PO TABS: ORAL_TABLET | ORAL | 1 refills | Status: DC

## 2024-01-14 MED ORDER — CLONAZEPAM 1 MG PO TABS: ORAL_TABLET | ORAL | 5 refills | Status: DC

## 2024-01-14 NOTE — Progress Notes (Signed)
SHATORA WEATHERBEE [id number] 02-16-1961 63 y.o.  Subjective:   Patient ID:  Carla Thompson is a 63 y.o. (DOB 26-Apr-1961) female.  Chief Complaint:  Chief Complaint  Patient presents with   Follow-up    HPI JAMEYAH FENNEWALD presents to the office today for follow-up of schizoaffective disorder.  visit October, 2020.  No med changes.  She continued on clozapine 400, lithium 600, and clonazepam 0.5 twice daily as needed.   03/09/20 appt noted: StepF had been sick with Covid and other problems.  Died.  Other family members with Covid problems too and died. [name] worked from home 13-Nov-2024 to April  Good overall without complaints.  Stress dealing with Covid.  Trying to stay active including cooking.  No major episodes of confusion nor voices.    No fear.  Does emails on the computer.  Not much anxiety lately.  Sleep same 9-9.  No fear episodes.  No manic nor depressive episodes.  No significant panic attacks recently.  She is not avoiding things out of anxiety generally.  She's still active at church.   Large infrequent BM clog toilet and intermittently not well managed.  Plan: Still problems with large infrequent q 4-7 days, large BM.  Often clogs the toilet.  Not well managed. Increase Linzess trial to 290 mg daily to see if it's better managed.  09/07/20 appt with following noted: She has notes. Balance problems, movements in feet legs, stomach.  Marching movements in feet.  Scares her. 1 fall on stairs going up. Wonders about reducing clozapine to 3 instead of 4 bc of tiredness. B researched lithium and said lithium and clozapine are "bad medicines with a lot of SE".   she's worried about SE. Disc ran out of clozapine for 2 weeks and decompensated.  back on it for almost a month.  Pharmacy was saying they couldn't dispense clozapine without labs and they didn't have the labs.  H says she's not back to normal yet bc can't focus and make simple decisions.  For example couldn't decide whether  to drink water out of the faucet or out of Brita water filter.  Lost 10# and not eating well.  Poor appetite.   Severely constipated chronically.  Using miralax.  Plan: continue clozapine  11/09/20 appt with following noted:  Seen with [name] [place] problems with chronic constipation and using Miralax twice daily.  It is helpful but varies.  Doesn't like clozapine for this reason.  Usual trouble is leakage from the stool.  Disc sphincter exercises.   Lost weight. Some depression over the TD including feet moving up and down.  Someitmes stomach moves.  Occ tremor but not lately.  Trouble with sleep, initial and middle. Some of the problems with getting clozapine may have been related to not getting th labs to the correct [facility] in [place].  Doesn't like prunes or prune juice..  Not attending church due to having to wear depends and "tardive dyskinesia".   Plan: Can switch to clonazepam 0.5 mg in am and 1.0 mg HS  02/20/2021 appointment with the following noted: Walks outside.   [name] got Covid for 3 weeks.  She got milder sx with assumption she had it also. No problems getting clozapine since here.  Have worked on getting it more consistently and that seems to be working.    Better than 6 mos ago.  Stronger physically and mentally.  No sig panic attacks now. Balance is better.  Weight loss stopped.  Not  depressed.   Better with more consistency with clozapine. Exercising more and that's helped.  Plan: Continue clozapine 400 mg HS  (She prefers all at once at night) Contnue lithium 600 mg HS Panic disorder controlled. clonazepam 0.75 mg BID   07/26/2021 appointment with the following noted: seen with [name] Hard time swallowing pills.  Disc jury duty.  She doesn't feel capable.  Doesn't drive.   Anxious about taking pills but otherwise is usually doing ok.  Tryitng to walk 6 times weekly and physically feels better.   Patient reports stable mood and denies depressed or irritable moods.   Patient denies any recent difficulty with anxiety.  Patient denies difficulty with sleep initiation or maintenance. Denies appetite disturbance.  Patient reports that energy and motivation have been good.  Patient with chronic difficulty with concentration.  Patient denies any suicidal ideation. No problems getting clozapine lately. Plan: No med changes  01/22/2022 appointment with the following noted: Continues clozapine 400 mg nightly, lithium 600 mg nightly, clonazepam 0.75 mg twice daily For the most part doing well.  Except fell and hurt tailbone.  Fell in the dark at night and was disoriented to space and tripped. It continues to hurt after a couple of weeks. Asks about reducing clozapine to 3 nightly. No panic attacks since here. Plan: No med changes:  Continue clozapine 400 mg HS  (She prefers all at once at night) Lithium 600 mg HS clonazepam 0.75 mg BID  07/10/2022 appointment noted: seen with H per usual Has notes.  Larey Seat getting into car and asked about iburprofen.  Feb fall and broke tailbone.  Trying to be careful about falling.  Wonders if meds contribute to fall risk.  Asks why need 4 clozapine. Still moves feet at times when not needed. Sun AM sometimes hard to stay awake.  Never falls asleep. Not driven in 11 years. No panic. No paranoia and psychosis. Plan: no med changes.    01/06/23 appt noted:  seen with H per usual Has notes.  Keeping busy.  Trying to exercise but afraid of stairs bc afraid of falling.  Last year 4 falls.  Did some exercises including walking some stairs for a couple months.  I think it may have helped but she stopped it bc fear of falling.  Does some exercising.   Generally not dizzy upon standing except when first awakens. Has continued meds consistently.   Some mild tremor. Will move her feet at church and wonder if it is related to clozapine. No sig psychotic sx of delusions or hallucinations.   Chronic frustration with clozapine bc constipation  and balance issues.  Sometimes interferes with activities. She doesn't remember what her sx were like when poorly controlled.  07/14/23 appt noted:  seen with H Meds: clonazepam 1 mg 1/2 tab TID, clozapine 400 mg HS, lithium 600 mg HS.   She still wants to reduce bc having problems with constipation and then diarrhea.   Trulance helped GI px.  New med.   Also prn lactulose.   Still large stools.  Eats a lot of fruits and vegetables.   Also does exercise to help.   Drinks only water.  Doctor managing constipation.   Taking calcium carbonate and magnesium. Was told to take iron. Mood and anxiety are stable.  No sig delusions or hallucinations.   No sig fearfulness.  No panic.   Sleep is the same and ok.   Still gets concerns she has TD bc episodes of moving feet  in ways that seem out of control.  Does not happen when she lays down.   Asks opinions on several medical problems. She feels like she is hard of hearing.   2 gkids Royal Piedra , Pamelia Hoit 62 yo.   Plan: Check lithium level & labs.  09/04/23 TC :   Patient with c/o burning sensation in her mouth, dry mouth, nausea, and occasional stomach spasms. She has difficulty trying to verbalize the stomach spasms. They don't occur every day, but is not able to provide more information.  She said the clozapine causes either diarrhea or constipation and your note mentions she would like to reduce it. She is on calcium, magnesium, vitamin C, and zinc supplements.     MD resp:   These are not new problems though they may get better and worse.  She has a GI doctor and they are managing her GI px.  Clozapine does tend to cause constipation but GI is managing that. Current meds should not be causing dry mouth or nausea.  She does need to drink lots of water with the meds. She and I and her H disc her med concerns at the last appt.  She has relapsed on lower doses of clozapine and it took a long time for her to get better.  It would be a mistake to try to reduce  that again.     01/14/24 appt noted:  with [name] Meds: clonazepam 1 mg 1/2 tab TID, clozapine 400 mg HS, lithium 600 mg HS.   Anxiety been good.  Generally.  Has tech anxiety. Still active at church.   Steve's aunt 41 yo died.   Sleep and dep are ok.   No problems with meds overall.  Lactulose prn helps with constipation.  Oldest daughter married March 2020  On clozapine for many years with benefit.  Several psych hospitalizations.  She is failed multiple other psychiatric medications which is why she is on clozapine.  She also failed or relapsed with dosage reduction of clozapine or if she missed clozapine.  She is consistent with her dosage now.  Past Psychiatric Medication Trials: prior antipsychotic trials Clozapine 400 Clonazepam Lithium 600 Citrucil, Miralax, stool softener, Mg tablets  Sister has schizophrenia.  Review of Systems:  Review of Systems  Cardiovascular:  Negative for palpitations.  Gastrointestinal:  Positive for abdominal distention, constipation and diarrhea. Negative for abdominal pain.  Genitourinary:  Positive for enuresis.  Musculoskeletal:  Positive for back pain, neck pain and neck stiffness.  Neurological:        Chronic balance issues with stooped posture.  Psychiatric/Behavioral:  Positive for decreased concentration, dysphoric mood and sleep disturbance. Negative for agitation, behavioral problems, confusion, hallucinations, self-injury and suicidal ideas. The patient is not nervous/anxious and is not hyperactive.   Constipation comes and goes  Medications: I have reviewed the patient's current medications.  Current Outpatient Medications  Medication Sig Dispense Refill   Ascorbic Acid (VITAMIN C) 500 MG CHEW Chew by mouth.     benzonatate (TESSALON) 100 MG capsule Take 1 capsule (100 mg total) by mouth 2 (two) times daily as needed for cough. 40 capsule 0   Calcium Carbonate-Vit D-Min (CALCIUM 1200 PO) Take by mouth.     famotidine (PEPCID) 40  MG tablet TAKE 1 TABLET(40 MG) BY MOUTH DAILY 90 tablet 1   lactulose (CHRONULAC) 10 GM/15ML solution Take 45 mLs (30 g total) by mouth 2 (two) times daily as needed for mild constipation. 946 mL 11  Magnesium 400 MG CAPS Take by mouth.     metoprolol tartrate (LOPRESSOR) 25 MG tablet Take 0.5 tablets (12.5 mg total) by mouth 2 (two) times daily. 90 tablet 1   Plecanatide (TRULANCE) 3 MG TABS Take 1 tablet (3 mg total) by mouth daily. 90 tablet 2   Vitamin D, Ergocalciferol, (DRISDOL) 1.25 MG (50000 UNIT) CAPS capsule TAKE 1 CAPSULE BY MOUTH EVERY 7 DAYS 12 capsule 1   zinc gluconate 50 MG tablet Take 50 mg by mouth daily.     clonazePAM (KLONOPIN) 1 MG tablet TAKE 1/2 TABLET BY MOUTH EVERY MORNING, AT NOON AND AT BEDTIME 45 tablet 5   cloZAPine (CLOZARIL) 100 MG tablet 4 tabs HS 120 tablet 6   lithium 300 MG tablet TAKE 2 TABLETS(600 MG) BY MOUTH AT BEDTIME 180 tablet 1   No current facility-administered medications for this visit.    Medication Side Effects: Other: alternating contstipation and diarrhea but has IBS  Allergies:  Allergies  Allergen Reactions   Levsin [Hyoscyamine Sulfate] Hives   Escitalopram Hives    Past Medical History:  Diagnosis Date   Abnormal perimenopausal bleeding    Bowel incontinence    Chronic constipation    Dermatitis    Dysplastic nevus 08/26/2022   L pinna ear, Moderate to Severe atypia, Excised 09/24/2022   High risk medication use    Hyperglycemia    Hypertriglyceridemia    Iron deficiency anemia due to chronic blood loss    secondary to heavy flow   Metallic taste    Schizo-affective psychosis (HCC)    Schizoaffective disorder, bipolar type (HCC)    Dr. Lucille Passy    Family History  Problem Relation Age of Onset   Mental illness Sister        Schizophrenia and bipolar   Cancer Maternal Grandmother        Colon   Mental illness Paternal Grandmother        Schizophrenia   Breast cancer Neg Hx     Social History    Socioeconomic History   Marital status: Married    Spouse name: Viviann Spare   Number of children: 2   Years of education: Not on file   Highest education level: Bachelor's degree (e.g., BA, AB, BS)  Occupational History   Not on file  Tobacco Use   Smoking status: Never   Smokeless tobacco: Never  Vaping Use   Vaping status: Never Used  Substance and Sexual Activity   Alcohol use: No    Alcohol/week: 0.0 standard drinks of alcohol   Drug use: No   Sexual activity: Not Currently    Partners: Male  Other Topics Concern   Not on file  Social History Narrative   Not on file   Social Drivers of Health   Financial Resource Strain: Low Risk  (02/20/2022)   Overall Financial Resource Strain (CARDIA)    Difficulty of Paying Living Expenses: Not hard at all  Food Insecurity: No Food Insecurity (02/20/2022)   Hunger Vital Sign    Worried About Running Out of Food in the Last Year: Never true    Ran Out of Food in the Last Year: Never true  Transportation Needs: No Transportation Needs (02/20/2022)   PRAPARE - Administrator, Civil Service (Medical): No    Lack of Transportation (Non-Medical): No  Physical Activity: Sufficiently Active (02/20/2022)   Exercise Vital Sign    Days of Exercise per Week: 6 days    Minutes of Exercise per  Session: 60 min  Stress: Stress Concern Present (02/20/2022)   [employer] of Occupational Health - Occupational Stress Questionnaire    Feeling of Stress : To some extent  Social Connections: Socially Integrated (02/20/2022)   Social Connection and Isolation Panel [NHANES]    Frequency of Communication with Friends and Family: Three times a week    Frequency of Social Gatherings with Friends and Family: Three times a week    Attends Religious Services: More than 4 times per year    Active Member of Clubs or Organizations: Yes    Attends Banker Meetings: More than 4 times per year    Marital Status: Married  Catering manager  Violence: Not At Risk (02/20/2022)   Humiliation, Afraid, Rape, and Kick questionnaire    Fear of Current or Ex-Partner: No    Emotionally Abused: No    Physically Abused: No    Sexually Abused: No    Past Medical History, Surgical history, Social history, and Family history were reviewed and updated as appropriate.   Please see review of systems for further details on the patient's review from today.   Objective:   Physical Exam:  LMP 12/16/2016 (Approximate)   Physical Exam Constitutional:      General: She is not in acute distress.    Appearance: She is well-developed.  Musculoskeletal:        General: No deformity.  Neurological:     Mental Status: She is alert and oriented to person, place, and time.     Cranial Nerves: No dysarthria.     Coordination: Coordination normal.  Psychiatric:        Attention and Perception: Perception normal. She is inattentive. She does not perceive auditory or visual hallucinations.        Mood and Affect: Mood is anxious. Mood is not depressed. Affect is not labile, blunt, angry, tearful or inappropriate.        Speech: Speech is not rapid and pressured, slurred or tangential.        Behavior: Behavior normal. Behavior is cooperative.        Thought Content: Thought content is not paranoid or delusional. Thought content does not include homicidal or suicidal ideation. Thought content does not include suicidal plan.        Cognition and Memory: Cognition is impaired. She exhibits impaired recent memory.     Comments: Insight fair to poor & judgment fair. Scattered thought chronically, talkative and  overinclusiveness but not as severe as in the past.  Difficult to finish session on time.  Not as fidgety as in the past.  Affect more relaxed. Not irritable Chronic stooped posture.       Lab Review:     Component Value Date/Time   NA 141 05/12/2023 1539   NA 140 01/18/2016 1418   NA 139 08/27/2013 0930   K 4.1 05/12/2023 1539   K 4.2  08/27/2013 0930   CL 106 05/12/2023 1539   CL 108 (H) 08/27/2013 0930   CO2 28 05/12/2023 1539   CO2 28 08/27/2013 0930   GLUCOSE 100 (H) 05/12/2023 1539   GLUCOSE 98 08/27/2013 0930   BUN 14 05/12/2023 1539   BUN 17 01/18/2016 1418   BUN 11 08/27/2013 0930   CREATININE 0.95 05/12/2023 1539   CALCIUM 9.2 05/12/2023 1539   CALCIUM 8.5 08/27/2013 0930   PROT 6.0 (L) 05/12/2023 1539   PROT 6.6 01/18/2016 1418   PROT 7.2 08/27/2013 0930   ALBUMIN 4.3 09/11/2020  2000   ALBUMIN 4.4 01/18/2016 1418   ALBUMIN 3.5 08/27/2013 0930   AST 12 05/12/2023 1539   AST 20 08/27/2013 0930   ALT 11 05/12/2023 1539   ALT 19 08/27/2013 0930   ALKPHOS 95 09/11/2020 2000   ALKPHOS 107 08/27/2013 0930   BILITOT 0.3 05/12/2023 1539   BILITOT <0.2 01/18/2016 1418   BILITOT 0.3 08/27/2013 0930   GFRNONAA >60 09/11/2020 2000   GFRNONAA 63 07/11/2020 1558   GFRAA >60 08/31/2020 1633   GFRAA 73 07/11/2020 1558       Component Value Date/Time   WBC 10.6 (H) 12/22/2023 1159   RBC 4.04 12/22/2023 1159   HGB 12.4 12/22/2023 1159   HGB 12.3 01/23/2016 1231   HCT 38.0 12/22/2023 1159   HCT 38.6 01/23/2016 1231   PLT 260 12/22/2023 1159   PLT 280 01/23/2016 1231   MCV 94.1 12/22/2023 1159   MCV 95 01/23/2016 1231   MCV 92 03/24/2015 1244   MCH 30.7 12/22/2023 1159   MCHC 32.6 12/22/2023 1159   RDW 12.3 12/22/2023 1159   RDW 13.7 01/23/2016 1231   RDW 13.1 03/24/2015 1244   LYMPHSABS 2.4 12/22/2023 1159   LYMPHSABS 2.4 01/23/2016 1231   LYMPHSABS 2.2 03/24/2015 1244   MONOABS 0.6 12/22/2023 1159   MONOABS 0.4 03/24/2015 1244   EOSABS 0.3 12/22/2023 1159   EOSABS 0.1 01/23/2016 1231   EOSABS 0.4 03/24/2015 1244   BASOSABS 0.1 12/22/2023 1159   BASOSABS 0.0 01/23/2016 1231   BASOSABS 0.0 03/24/2015 1244    Lithium Lvl  Date Value Ref Range Status  12/22/2023 0.68 0.60 - 1.20 mmol/L Final    Comment:    Performed at [facility], [address] [place], [place] [address]      Lab Results  Component Value Date   VALPROATE 102 (H) 12/26/2015     .res Assessment: Plan:    Schizoaffective disorder, bipolar type (HCC) - Plan: CBC with Differential/Platelet, cloZAPine (CLOZARIL) 100 MG tablet, lithium 300 MG tablet  Panic disorder with agoraphobia - Plan: clonazePAM (KLONOPIN) 1 MG tablet  Long term current use of clozapine - Plan: CBC with Differential/Platelet  Lithium use  Chronic constipation   Overall her schizoaffective disorder was under control.  Until she ran out of clozapine bc of difficulty getting it last summer/fall 2021.  This led to more disorganziation, confusion, anxiety and rumination.  Insight is not good.  Not self aware about overinclusiveness. All of this is better now with better clozapine consistency and exercise.  Discussed the importance of consistent with the clozapine.  Disc severity of sx she had when inadequate dose of clozapine.  If they have difficulty obtaining it in the future please contact us right away.  She missed an off of the clozapine to cause disorganization and psychotic symptoms as well as anxiety.  In past when resumed took many weeks for psychosis to resolve.  Answered questions about clozapine and alternatives.  This has been much more effective and will not cause TD.  No comparable alternative. Continue clozapine 400 mg HS  (She prefers all at once at night)  Panic disorder controlled. Consider reduction bc balance but history poor tolerance and coping with panic.  continue clonazepam 0.5 mg TID  Contnue lithium 600 mg HS.  12/2023 =0.68, stable for several mos.  Counseled patient regarding potential benefits, risks, and side effects of lithium to include potential risk of lithium affecting thyroid and renal function.  Discussed need for periodic lab  monitoring to determine drug level and to assess for potential adverse effects.  Counseled patient regarding signs and symptoms of lithium toxicity and advised that  they notify office immediately or seek urgent medical attention if experiencing these signs and symptoms.  Patient advised to contact office with any questions or concerns. Disc DDI with NSAIDs.  Need lithium level  regularly. BMP 05/2023 normal.  Fall precautions around sedative medications. Previously Disc PT for head forward posture which she did some.  This as means to help reduce fall risk.    ANC stable . Continue clozapine.   Continue lab test per usual.    Vitamin D in winter.  GI managing constipation better.  Exercise helps constipation and new meds.   She is walking 6 days per week.  Disc getting help for hearing impairment and getting assessed.    No med changes:  Continue clozapine 400 mg HS  (She prefers all at once at night).  Risky to reduce bc took months to get better when she was psychotic before.  Rec not reduce. Lithium 600 mg HS clonazepam 0.5 mg TID  She wants to consider reduction in clozapine DT constipation.  However,  She also failed or relapsed with dosage reduction of clozapine or if she missed clozapine.  Strongly rec against reducing the dose.    This appt was 30 mins.  FU 6 mos  Meredith Staggers, MD, DFAPA   Please see After Visit Summary for patient specific instructions.  Future Appointments  Date Time Provider Department Center  05/10/2024  3:40 PM Alba Cory, MD [facility]  01/12/2025  4:00 PM Deirdre Evener, MD [facility] None    Orders Placed This Encounter  Procedures   CBC with Differential/Platelet       -------------------------------

## 2024-01-15 ENCOUNTER — Ambulatory Visit: Payer: 59 | Admitting: Dermatology

## 2024-01-20 ENCOUNTER — Ambulatory Visit: Payer: 59 | Admitting: Dermatology

## 2024-01-20 ENCOUNTER — Other Ambulatory Visit: Payer: Self-pay

## 2024-01-20 DIAGNOSIS — F250 Schizoaffective disorder, bipolar type: Secondary | ICD-10-CM

## 2024-01-20 DIAGNOSIS — Z79899 Other long term (current) drug therapy: Secondary | ICD-10-CM

## 2024-01-23 ENCOUNTER — Other Ambulatory Visit
Admission: RE | Admit: 2024-01-23 | Discharge: 2024-01-23 | Disposition: A | Discharge status: Home / Self Care | Payer: 59 | Attending: Psychiatry | Admitting: Psychiatry

## 2024-01-23 DIAGNOSIS — F250 Schizoaffective disorder, bipolar type: Secondary | ICD-10-CM | POA: Diagnosis present

## 2024-01-23 DIAGNOSIS — Z79899 Other long term (current) drug therapy: Secondary | ICD-10-CM | POA: Diagnosis present

## 2024-01-23 LAB — CBC WITH DIFFERENTIAL/PLATELET
Abs Immature Granulocytes: 0.02 10*3/uL (ref 0.00–0.07)
Basophils Absolute: 0 10*3/uL (ref 0.0–0.1)
Basophils Relative: 1 %
Eosinophils Absolute: 0.3 10*3/uL (ref 0.0–0.5)
Eosinophils Relative: 3 %
HCT: 34.6 % — ABNORMAL LOW (ref 36.0–46.0)
Hemoglobin: 11.5 g/dL — ABNORMAL LOW (ref 12.0–15.0)
Immature Granulocytes: 0 %
Lymphocytes Relative: 32 %
Lymphs Abs: 2.7 10*3/uL (ref 0.7–4.0)
MCH: 30.5 pg (ref 26.0–34.0)
MCHC: 33.2 g/dL (ref 30.0–36.0)
MCV: 91.8 fL (ref 80.0–100.0)
Monocytes Absolute: 0.5 10*3/uL (ref 0.1–1.0)
Monocytes Relative: 7 %
Neutro Abs: 4.7 10*3/uL (ref 1.7–7.7)
Neutrophils Relative %: 57 %
Platelets: 223 10*3/uL (ref 150–400)
RBC: 3.77 MIL/uL — ABNORMAL LOW (ref 3.87–5.11)
RDW: 12.9 % (ref 11.5–15.5)
WBC: 8.2 10*3/uL (ref 4.0–10.5)
nRBC: 0 % (ref 0.0–0.2)

## 2024-01-23 LAB — LITHIUM LEVEL: Lithium Lvl: 0.63 mmol/L (ref 0.60–1.20)

## 2024-02-20 ENCOUNTER — Other Ambulatory Visit
Admission: RE | Admit: 2024-02-20 | Discharge: 2024-02-20 | Disposition: A | Discharge status: Home / Self Care | Source: Ambulatory Visit | Attending: Psychiatry | Admitting: Psychiatry

## 2024-02-20 DIAGNOSIS — Z79899 Other long term (current) drug therapy: Secondary | ICD-10-CM | POA: Diagnosis present

## 2024-02-20 DIAGNOSIS — F250 Schizoaffective disorder, bipolar type: Secondary | ICD-10-CM | POA: Diagnosis present

## 2024-02-20 LAB — CBC WITH DIFFERENTIAL/PLATELET
Abs Immature Granulocytes: 0.02 10*3/uL (ref 0.00–0.07)
Basophils Absolute: 0.1 10*3/uL (ref 0.0–0.1)
Basophils Relative: 1 %
Eosinophils Absolute: 0.3 10*3/uL (ref 0.0–0.5)
Eosinophils Relative: 3 %
HCT: 36.8 % (ref 36.0–46.0)
Hemoglobin: 11.9 g/dL — ABNORMAL LOW (ref 12.0–15.0)
Immature Granulocytes: 0 %
Lymphocytes Relative: 32 %
Lymphs Abs: 2.6 10*3/uL (ref 0.7–4.0)
MCH: 30.9 pg (ref 26.0–34.0)
MCHC: 32.3 g/dL (ref 30.0–36.0)
MCV: 95.6 fL (ref 80.0–100.0)
Monocytes Absolute: 0.6 10*3/uL (ref 0.1–1.0)
Monocytes Relative: 7 %
Neutro Abs: 4.6 10*3/uL (ref 1.7–7.7)
Neutrophils Relative %: 57 %
Platelets: 216 10*3/uL (ref 150–400)
RBC: 3.85 MIL/uL — ABNORMAL LOW (ref 3.87–5.11)
RDW: 13 % (ref 11.5–15.5)
WBC: 8.1 10*3/uL (ref 4.0–10.5)
nRBC: 0 % (ref 0.0–0.2)

## 2024-02-23 ENCOUNTER — Other Ambulatory Visit: Payer: Self-pay | Admitting: Psychiatry

## 2024-02-23 DIAGNOSIS — F250 Schizoaffective disorder, bipolar type: Secondary | ICD-10-CM

## 2024-02-23 NOTE — Telephone Encounter (Signed)
 Patient lvm at 4:08 stating that she called pharmacy and they informed her that the FDA is no longer requiring lab work for Clozapine 100mg . She needs a refill sent to [facility], Ph: [phone] Appt 8/12

## 2024-02-23 NOTE — Telephone Encounter (Signed)
 RF addressed thru pharmacy interface

## 2024-03-17 ENCOUNTER — Telehealth: Payer: Self-pay | Admitting: Psychiatry

## 2024-03-17 NOTE — Telephone Encounter (Signed)
 Pt called at 4:45p requesting a note to be excused from [employer].  She said Dr Toi Foster has provided it before.  She doesn't drive so she can't bring it in.  She wants to know how to get it to him (I thought we needed the summons).  Can she mail it in or can you take the info over the phone?  Jury duty date is May 5.  I advised her there is a fee.  Next appt 8/12

## 2024-03-18 ENCOUNTER — Other Ambulatory Visit: Payer: Self-pay | Admitting: Family Medicine

## 2024-03-18 DIAGNOSIS — K219 Gastro-esophageal reflux disease without esophagitis: Secondary | ICD-10-CM

## 2024-03-18 NOTE — Telephone Encounter (Signed)
 Have tried twice to call pt at primary # and get message that call cannot be completed at this time. Tried calling mobile #, which shows as her husband's # and get message that VM has not been set up yet.

## 2024-03-19 ENCOUNTER — Other Ambulatory Visit
Admission: RE | Admit: 2024-03-19 | Discharge: 2024-03-19 | Disposition: A | Discharge status: Home / Self Care | Source: Ambulatory Visit | Attending: Psychiatry | Admitting: Psychiatry

## 2024-03-19 DIAGNOSIS — F250 Schizoaffective disorder, bipolar type: Secondary | ICD-10-CM | POA: Diagnosis present

## 2024-03-19 DIAGNOSIS — Z79899 Other long term (current) drug therapy: Secondary | ICD-10-CM | POA: Insufficient documentation

## 2024-03-19 LAB — CBC WITH DIFFERENTIAL/PLATELET
Abs Immature Granulocytes: 0.02 10*3/uL (ref 0.00–0.07)
Basophils Absolute: 0.1 10*3/uL (ref 0.0–0.1)
Basophils Relative: 1 %
Eosinophils Absolute: 0.3 10*3/uL (ref 0.0–0.5)
Eosinophils Relative: 3 %
HCT: 35 % — ABNORMAL LOW (ref 36.0–46.0)
Hemoglobin: 11.3 g/dL — ABNORMAL LOW (ref 12.0–15.0)
Immature Granulocytes: 0 %
Lymphocytes Relative: 33 %
Lymphs Abs: 2.8 10*3/uL (ref 0.7–4.0)
MCH: 30.8 pg (ref 26.0–34.0)
MCHC: 32.3 g/dL (ref 30.0–36.0)
MCV: 95.4 fL (ref 80.0–100.0)
Monocytes Absolute: 0.6 10*3/uL (ref 0.1–1.0)
Monocytes Relative: 7 %
Neutro Abs: 4.8 10*3/uL (ref 1.7–7.7)
Neutrophils Relative %: 56 %
Platelets: 217 10*3/uL (ref 150–400)
RBC: 3.67 MIL/uL — ABNORMAL LOW (ref 3.87–5.11)
RDW: 12.4 % (ref 11.5–15.5)
WBC: 8.5 10*3/uL (ref 4.0–10.5)
nRBC: 0 % (ref 0.0–0.2)

## 2024-03-22 ENCOUNTER — Telehealth: Payer: Self-pay | Admitting: Psychiatry

## 2024-03-22 NOTE — Telephone Encounter (Signed)
 Husband brought in jury summons and paid fee.

## 2024-03-22 NOTE — Telephone Encounter (Signed)
 Demeka Bentler's spouse Landon Pinion came by to have a jury service dismissal for [employer] .

## 2024-03-22 NOTE — Telephone Encounter (Signed)
 Ciearra Baker's husband brought in a jury service summons. She is requesting a letter to be excused. Husband will pick up when ready.

## 2024-03-22 NOTE — Telephone Encounter (Signed)
 Agree to excuse from jurgy duty DT poor attention secondary to schizoaffective disorder.

## 2024-03-23 DIAGNOSIS — Z0289 Encounter for other administrative examinations: Secondary | ICD-10-CM

## 2024-04-20 ENCOUNTER — Other Ambulatory Visit
Admission: RE | Admit: 2024-04-20 | Discharge: 2024-04-20 | Disposition: A | Discharge status: Home / Self Care | Attending: Psychiatry | Admitting: Psychiatry

## 2024-04-20 DIAGNOSIS — Z79899 Other long term (current) drug therapy: Secondary | ICD-10-CM | POA: Insufficient documentation

## 2024-04-20 DIAGNOSIS — F250 Schizoaffective disorder, bipolar type: Secondary | ICD-10-CM | POA: Insufficient documentation

## 2024-04-20 LAB — CBC WITH DIFFERENTIAL/PLATELET
Abs Immature Granulocytes: 0.02 10*3/uL (ref 0.00–0.07)
Basophils Absolute: 0.1 10*3/uL (ref 0.0–0.1)
Basophils Relative: 1 %
Eosinophils Absolute: 0.2 10*3/uL (ref 0.0–0.5)
Eosinophils Relative: 2 %
HCT: 37.4 % (ref 36.0–46.0)
Hemoglobin: 12 g/dL (ref 12.0–15.0)
Immature Granulocytes: 0 %
Lymphocytes Relative: 22 %
Lymphs Abs: 1.8 10*3/uL (ref 0.7–4.0)
MCH: 30.5 pg (ref 26.0–34.0)
MCHC: 32.1 g/dL (ref 30.0–36.0)
MCV: 95.2 fL (ref 80.0–100.0)
Monocytes Absolute: 0.5 10*3/uL (ref 0.1–1.0)
Monocytes Relative: 6 %
Neutro Abs: 5.6 10*3/uL (ref 1.7–7.7)
Neutrophils Relative %: 69 %
Platelets: 228 10*3/uL (ref 150–400)
RBC: 3.93 MIL/uL (ref 3.87–5.11)
RDW: 12.5 % (ref 11.5–15.5)
WBC: 8.2 10*3/uL (ref 4.0–10.5)
nRBC: 0 % (ref 0.0–0.2)

## 2024-05-10 ENCOUNTER — Encounter: Payer: Self-pay | Admitting: Family Medicine

## 2024-05-10 ENCOUNTER — Ambulatory Visit: Payer: 59 | Admitting: Family Medicine

## 2024-05-10 VITALS — BP 118/72 | HR 91 | Resp 16 | Ht 64.0 in | Wt 144.4 lb

## 2024-05-10 DIAGNOSIS — G8929 Other chronic pain: Secondary | ICD-10-CM

## 2024-05-10 DIAGNOSIS — M5382 Other specified dorsopathies, cervical region: Secondary | ICD-10-CM

## 2024-05-10 DIAGNOSIS — K219 Gastro-esophageal reflux disease without esophagitis: Secondary | ICD-10-CM | POA: Diagnosis not present

## 2024-05-10 DIAGNOSIS — K581 Irritable bowel syndrome with constipation: Secondary | ICD-10-CM

## 2024-05-10 DIAGNOSIS — E559 Vitamin D deficiency, unspecified: Secondary | ICD-10-CM

## 2024-05-10 DIAGNOSIS — Z862 Personal history of diseases of the blood and blood-forming organs and certain disorders involving the immune mechanism: Secondary | ICD-10-CM

## 2024-05-10 DIAGNOSIS — M545 Low back pain, unspecified: Secondary | ICD-10-CM | POA: Diagnosis not present

## 2024-05-10 DIAGNOSIS — Z131 Encounter for screening for diabetes mellitus: Secondary | ICD-10-CM

## 2024-05-10 DIAGNOSIS — R000 Tachycardia, unspecified: Secondary | ICD-10-CM

## 2024-05-10 DIAGNOSIS — F250 Schizoaffective disorder, bipolar type: Secondary | ICD-10-CM | POA: Diagnosis not present

## 2024-05-10 DIAGNOSIS — E785 Hyperlipidemia, unspecified: Secondary | ICD-10-CM

## 2024-05-10 DIAGNOSIS — M8589 Other specified disorders of bone density and structure, multiple sites: Secondary | ICD-10-CM

## 2024-05-10 DIAGNOSIS — Z79899 Other long term (current) drug therapy: Secondary | ICD-10-CM

## 2024-05-10 MED ORDER — METOPROLOL TARTRATE 25 MG PO TABS: 12.5000 mg | ORAL_TABLET | Freq: Two times a day (BID) | ORAL | 1 refills | Status: DC

## 2024-05-10 NOTE — Progress Notes (Signed)
 Name: Carla Thompson   MRN: [id number]    DOB: 04/26/1961   Date:05/10/2024       Progress Note  Subjective  Chief Complaint  Chief Complaint  Patient presents with   Medical Management of Chronic Issues   Discussed the use of AI scribe software for clinical note transcription with the patient, who gave verbal consent to proceed.  History of Present Illness Carla Thompson is a 63 year old female with schizoaffective disorder bipolar type who presents for a six-month follow-up. She is accompanied by her husband, Siegfried Dress.  She continues to manage her schizoaffective disorder, bipolar type, with clozapine , clonazepam , and lithium . Clozapine  is now available in [place], reducing her travel time. She takes clonazepam  0.5 mg three times daily and lithium  300 mg, two tablets at night. Her last lithium  level in February was 0.63. Her emotional state remains stable.  She has a history of iron deficiency anemia and is currently experiencing no unusual cravings. She previously craved cookies and ice cream but denies any current cravings for non-food substances.  She manages neck muscle weakness with home exercises as advised by her physical therapist. Her routine includes lying flat on her back with a pillow under her knees for 15 to 30 minutes.  She is managing gastroesophageal reflux disease (GERD) and irritable bowel syndrome (IBS) with constipation. She takes Trulance  and lactulose  for constipation and Pepcid  for heartburn and indigestion. She reports going to the bathroom three times a week, an improvement from twice a week, attributing this to dietary changes including prunes, strawberries, and beans.  She takes vitamin D  over the counter, one per day, and acknowledges a previous prescription that was intended for once a week dosing.    Patient Active Problem List   Diagnosis Date Noted   Chronic bilateral low back pain without sciatica 11/11/2023   Osteopenia of multiple sites 11/11/2023    Neck muscle weakness 11/11/2023   Muscular deconditioning 11/11/2023   Vitamin D  deficiency 05/15/2022   Dyslipidemia 11/20/2017   GERD without esophagitis 02/21/2017   Tachycardia 02/20/2016   RLS (restless legs syndrome) 12/21/2015   Irritable bowel syndrome with constipation 12/21/2015   Other long term (current) drug therapy 12/08/2015   Chronic constipation 12/08/2015   Insomnia, persistent 12/08/2015   Dermatitis, eczematoid 12/08/2015   Gravida 2 para 2 12/08/2015   Hypertriglyceridemia 12/08/2015   Metallic taste 12/08/2015   Female climacteric state 12/08/2015   Schizoaffective disorder, bipolar type (HCC) 12/08/2015    Past Surgical History:  Procedure Laterality Date   ANUS SURGERY  2000   [place]   COLONOSCOPY     COLONOSCOPY WITH PROPOFOL  N/A 11/30/2020   Procedure: COLONOSCOPY WITH PROPOFOL ;  Surgeon: Luke Salaam, MD;  Location: [facility] ENDOSCOPY;  Service: Gastroenterology;  Laterality: N/A;   TUBAL LIGATION  12/02/1994    Family History  Problem Relation Age of Onset   Mental illness Sister        Schizophrenia and bipolar   Cancer Maternal Grandmother        Colon   Mental illness Paternal Grandmother        Schizophrenia   Breast cancer Neg Hx     Social History   Tobacco Use   Smoking status: Never   Smokeless tobacco: Never  Substance Use Topics   Alcohol use: No    Alcohol/week: 0.0 standard drinks of alcohol     Current Outpatient Medications:    Ascorbic Acid (VITAMIN C) 500 MG CHEW, Chew by mouth., Disp: ,  Rfl:    Calcium Carbonate-Vit D-Min (CALCIUM 1200 PO), Take by mouth., Disp: , Rfl:    clonazePAM  (KLONOPIN ) 1 MG tablet, TAKE 1/2 TABLET BY MOUTH EVERY MORNING, AT NOON AND AT BEDTIME, Disp: 45 tablet, Rfl: 5   cloZAPine  (CLOZARIL ) 100 MG tablet, TAKE 4 TABLETS AT BEDTIME, Disp: 120 tablet, Rfl: 6   famotidine  (PEPCID ) 40 MG tablet, TAKE 1 TABLET(40 MG) BY MOUTH DAILY, Disp: 90 tablet, Rfl: 1   lactulose  (CHRONULAC ) 10 GM/15ML  solution, Take 45 mLs (30 g total) by mouth 2 (two) times daily as needed for mild constipation., Disp: 946 mL, Rfl: 11   lithium  300 MG tablet, TAKE 2 TABLETS(600 MG) BY MOUTH AT BEDTIME, Disp: 180 tablet, Rfl: 1   Magnesium  400 MG CAPS, Take by mouth., Disp: , Rfl:    metoprolol  tartrate (LOPRESSOR ) 25 MG tablet, Take 0.5 tablets (12.5 mg total) by mouth 2 (two) times daily., Disp: 90 tablet, Rfl: 1   Plecanatide  (TRULANCE ) 3 MG TABS, Take 1 tablet (3 mg total) by mouth daily., Disp: 90 tablet, Rfl: 2   Vitamin D , Ergocalciferol , (DRISDOL ) 1.25 MG (50000 UNIT) CAPS capsule, TAKE 1 CAPSULE BY MOUTH EVERY 7 DAYS, Disp: 12 capsule, Rfl: 1   zinc gluconate 50 MG tablet, Take 50 mg by mouth daily., Disp: , Rfl:    benzonatate  (TESSALON ) 100 MG capsule, Take 1 capsule (100 mg total) by mouth 2 (two) times daily as needed for cough. (Patient not taking: Reported on 05/10/2024), Disp: 40 capsule, Rfl: 0  Allergies  Allergen Reactions   Levsin [Hyoscyamine Sulfate] Hives   Escitalopram Hives    I personally reviewed active problem list, medication list, allergies with the patient/caregiver today.   ROS  Ten systems reviewed and is negative except as mentioned in HPI    Objective Physical Exam  CONSTITUTIONAL: Patient appears well-developed and well-nourished.  No distress. HEENT: Head atraumatic, normocephalic, neck supple. CARDIOVASCULAR: Normal rate, regular rhythm and normal heart sounds.  No murmur heard. No BLE edema. PULMONARY: Effort normal and breath sounds normal. No respiratory distress. ABDOMINAL: There is no tenderness or distention. MUSCULOSKELETAL: slow gait, weak neck muscles  PSYCHIATRIC: Patient has a normal mood and affect. behavior is normal. Judgment and thought content normal.  Vitals:   05/10/24 1535  BP: 118/72  Pulse: 91  Resp: 16  SpO2: 99%  Weight: 144 lb 6.4 oz (65.5 kg)  Height: 5\' 4"  (1.626 m)    Body mass index is 24.79 kg/m.  Recent Results (from  the past 2160 hours)  CBC with Differential/Platelet     Status: Abnormal   Collection Time: 02/20/24  2:44 PM  Result Value Ref Range   WBC 8.1 4.0 - 10.5 K/uL   RBC 3.85 (L) 3.87 - 5.11 MIL/uL   Hemoglobin 11.9 (L) 12.0 - 15.0 g/dL   HCT 91.4 78.2 - 95.6 %   MCV 95.6 80.0 - 100.0 fL   MCH 30.9 26.0 - 34.0 pg   MCHC 32.3 30.0 - 36.0 g/dL   RDW 21.3 08.6 - 57.8 %   Platelets 216 150 - 400 K/uL   nRBC 0.0 0.0 - 0.2 %   Neutrophils Relative % 57 %   Neutro Abs 4.6 1.7 - 7.7 K/uL   Lymphocytes Relative 32 %   Lymphs Abs 2.6 0.7 - 4.0 K/uL   Monocytes Relative 7 %   Monocytes Absolute 0.6 0.1 - 1.0 K/uL   Eosinophils Relative 3 %   Eosinophils Absolute 0.3 0.0 - 0.5  K/uL   Basophils Relative 1 %   Basophils Absolute 0.1 0.0 - 0.1 K/uL   Immature Granulocytes 0 %   Abs Immature Granulocytes 0.02 0.00 - 0.07 K/uL    Comment: Performed at [facility], [address] [place], [place] [address]  CBC with Differential/Platelet     Status: Abnormal   Collection Time: 03/19/24  3:40 PM  Result Value Ref Range   WBC 8.5 4.0 - 10.5 K/uL   RBC 3.67 (L) 3.87 - 5.11 MIL/uL   Hemoglobin 11.3 (L) 12.0 - 15.0 g/dL   HCT 60.4 (L) 54.0 - 98.1 %   MCV 95.4 80.0 - 100.0 fL   MCH 30.8 26.0 - 34.0 pg   MCHC 32.3 30.0 - 36.0 g/dL   RDW 19.1 47.8 - 29.5 %   Platelets 217 150 - 400 K/uL   nRBC 0.0 0.0 - 0.2 %   Neutrophils Relative % 56 %   Neutro Abs 4.8 1.7 - 7.7 K/uL   Lymphocytes Relative 33 %   Lymphs Abs 2.8 0.7 - 4.0 K/uL   Monocytes Relative 7 %   Monocytes Absolute 0.6 0.1 - 1.0 K/uL   Eosinophils Relative 3 %   Eosinophils Absolute 0.3 0.0 - 0.5 K/uL   Basophils Relative 1 %   Basophils Absolute 0.1 0.0 - 0.1 K/uL   Immature Granulocytes 0 %   Abs Immature Granulocytes 0.02 0.00 - 0.07 K/uL    Comment: Performed at [facility], [address] [place], [place] [address]  CBC with Differential/Platelet     Status: None   Collection Time: 04/20/24 11:43 AM   Result Value Ref Range   WBC 8.2 4.0 - 10.5 K/uL   RBC 3.93 3.87 - 5.11 MIL/uL   Hemoglobin 12.0 12.0 - 15.0 g/dL   HCT 86.5 78.4 - 69.6 %   MCV 95.2 80.0 - 100.0 fL   MCH 30.5 26.0 - 34.0 pg   MCHC 32.1 30.0 - 36.0 g/dL   RDW 29.5 28.4 - 13.2 %   Platelets 228 150 - 400 K/uL   nRBC 0.0 0.0 - 0.2 %   Neutrophils Relative % 69 %   Neutro Abs 5.6 1.7 - 7.7 K/uL   Lymphocytes Relative 22 %   Lymphs Abs 1.8 0.7 - 4.0 K/uL   Monocytes Relative 6 %   Monocytes Absolute 0.5 0.1 - 1.0 K/uL   Eosinophils Relative 2 %   Eosinophils Absolute 0.2 0.0 - 0.5 K/uL   Basophils Relative 1 %   Basophils Absolute 0.1 0.0 - 0.1 K/uL   Immature Granulocytes 0 %   Abs Immature Granulocytes 0.02 0.00 - 0.07 K/uL    Comment: Performed at [facility], [address] [place], [place] [address]    Diabetic Foot Exam:     PHQ2/9:    05/10/2024    3:35 PM 12/15/2023    1:51 PM 11/11/2023    3:11 PM 05/12/2023    2:57 PM 11/29/2022    3:43 PM  Depression screen PHQ 2/9  Decreased Interest 0 0 0 0 0  Down, Depressed, Hopeless 0 0 0 0 0  PHQ - 2 Score 0 0 0 0 0  Altered sleeping  0 0 0 0  Tired, decreased energy  0 0 0 0  Change in appetite  0 0 0 0  Feeling bad or failure about yourself   0 0 0 0  Trouble concentrating  0 0 0 0  Moving slowly or  fidgety/restless  0 0 0 0  Suicidal thoughts  0 0 0 0  PHQ-9 Score  0 0 0 0  Difficult doing work/chores  Not difficult at all Not difficult at all Not difficult at all Not difficult at all    phq 9 is negative  Fall Risk:    05/10/2024    3:32 PM 12/15/2023    1:51 PM 11/11/2023    3:07 PM 10/08/2023    9:39 AM 05/12/2023    2:56 PM  Fall Risk   Falls in the past year? 0 0 0 1 1  Number falls in past yr: 0 0 0 0 1  Injury with Fall? 0 0 0 1 1  Risk for fall due to : No Fall Risks No Fall Risks No Fall Risks History of fall(s)   Follow up Falls prevention discussed;Education provided;Falls evaluation completed Falls prevention  discussed;Education provided;Falls evaluation completed Falls prevention discussed;Education provided;Falls evaluation completed Education provided;Falls prevention discussed;Falls evaluation completed       Assessment & Plan Schizoaffective disorder, bipolar type Well-managed on clozapine , clonazepam , and lithium . Lithium  level within normal range. Monitoring of kidney and thyroid  function necessary due to lithium . - Check kidney function and thyroid  levels. - Continue clozapine , clonazepam , and lithium .  Irritable bowel syndrome with constipation (IBS-C) Improved bowel movements with Trulance  and lactulose . Increased fiber intake. - Continue Trulance  and lactulose . - Maintain high-fiber diet.  Gastroesophageal reflux disease (GERD) Managed with Pepcid , adequate supply available. - Ensure adequate supply of Pepcid .  Iron deficiency anemia Monitoring iron levels to ensure stability. No unusual symptoms. - Check iron levels.  Neck muscle weakness Improvement reported, continues home exercises. - Continue home exercises.  Vitamin D  supplementation Previously high-normal levels. Daily supplementation ongoing, reassessment planned. - Check vitamin D  levels. - Continue vitamin D  supplementation pending results.  General Health Maintenance Routine blood work due. Blood pressure and weight stable. - Order CBC, thyroid  function tests, A1c, and cholesterol levels.

## 2024-05-11 ENCOUNTER — Ambulatory Visit: Payer: Self-pay | Admitting: Family Medicine

## 2024-05-11 LAB — COMPREHENSIVE METABOLIC PANEL WITH GFR
AG Ratio: 2.2 (calc) (ref 1.0–2.5)
ALT: 14 U/L (ref 6–29)
AST: 13 U/L (ref 10–35)
Albumin: 4.2 g/dL (ref 3.6–5.1)
Alkaline phosphatase (APISO): 91 U/L (ref 37–153)
BUN: 14 mg/dL (ref 7–25)
CO2: 29 mmol/L (ref 20–32)
Calcium: 9.3 mg/dL (ref 8.6–10.4)
Chloride: 105 mmol/L (ref 98–110)
Creat: 0.96 mg/dL (ref 0.50–1.05)
Globulin: 1.9 g/dL (ref 1.9–3.7)
Glucose, Bld: 111 mg/dL — ABNORMAL HIGH (ref 65–99)
Potassium: 4.2 mmol/L (ref 3.5–5.3)
Sodium: 142 mmol/L (ref 135–146)
Total Bilirubin: 0.3 mg/dL (ref 0.2–1.2)
Total Protein: 6.1 g/dL (ref 6.1–8.1)
eGFR: 67 mL/min/{1.73_m2} (ref >=60)

## 2024-05-11 LAB — CBC WITH DIFFERENTIAL/PLATELET
Absolute Lymphocytes: 2741 {cells}/uL (ref 850–3900)
Absolute Monocytes: 514 {cells}/uL (ref 200–950)
Basophils Absolute: 47 {cells}/uL (ref 0–200)
Basophils Relative: 0.6 %
Eosinophils Absolute: 300 {cells}/uL (ref 15–500)
Eosinophils Relative: 3.8 %
HCT: 36.2 % (ref 35.0–45.0)
Hemoglobin: 11.7 g/dL (ref 11.7–15.5)
MCH: 30.6 pg (ref 27.0–33.0)
MCHC: 32.3 g/dL (ref 32.0–36.0)
MCV: 94.8 fL (ref 80.0–100.0)
MPV: 10.8 fL (ref 7.5–12.5)
Monocytes Relative: 6.5 %
Neutro Abs: 4298 {cells}/uL (ref 1500–7800)
Neutrophils Relative %: 54.4 %
Platelets: 231 10*3/uL (ref 140–400)
RBC: 3.82 10*6/uL (ref 3.80–5.10)
RDW: 12.1 % (ref 11.0–15.0)
Total Lymphocyte: 34.7 %
WBC: 7.9 10*3/uL (ref 3.8–10.8)

## 2024-05-11 LAB — IRON,TIBC AND FERRITIN PANEL
%SAT: 16 % (ref 16–45)
Ferritin: 28 ng/mL (ref 16–288)
Iron: 59 ug/dL (ref 45–160)
TIBC: 369 ug/dL (ref 250–450)

## 2024-05-11 LAB — LIPID PANEL
Cholesterol: 174 mg/dL (ref <200)
HDL: 56 mg/dL (ref >=50)
LDL Cholesterol (Calc): 87 mg/dL
Non-HDL Cholesterol (Calc): 118 mg/dL (ref <130)
Total CHOL/HDL Ratio: 3.1 (calc) (ref <5.0)
Triglycerides: 216 mg/dL — ABNORMAL HIGH (ref <150)

## 2024-05-11 LAB — TSH: TSH: 1.88 m[IU]/L (ref 0.40–4.50)

## 2024-05-11 LAB — VITAMIN D 25 HYDROXY (VIT D DEFICIENCY, FRACTURES): Vit D, 25-Hydroxy: 54 ng/mL (ref 30–100)

## 2024-05-11 LAB — HEMOGLOBIN A1C
Hgb A1c MFr Bld: 5.2 % (ref <5.7)
Mean Plasma Glucose: 103 mg/dL
eAG (mmol/L): 5.7 mmol/L

## 2024-05-19 ENCOUNTER — Other Ambulatory Visit
Admission: RE | Admit: 2024-05-19 | Discharge: 2024-05-19 | Disposition: A | Discharge status: Home / Self Care | Attending: Psychiatry | Admitting: Psychiatry

## 2024-05-19 DIAGNOSIS — F250 Schizoaffective disorder, bipolar type: Secondary | ICD-10-CM | POA: Insufficient documentation

## 2024-05-19 DIAGNOSIS — Z79899 Other long term (current) drug therapy: Secondary | ICD-10-CM | POA: Diagnosis not present

## 2024-05-19 LAB — CBC
HCT: 35.6 % — ABNORMAL LOW (ref 36.0–46.0)
Hemoglobin: 11.7 g/dL — ABNORMAL LOW (ref 12.0–15.0)
MCH: 30.8 pg (ref 26.0–34.0)
MCHC: 32.9 g/dL (ref 30.0–36.0)
MCV: 93.7 fL (ref 80.0–100.0)
Platelets: 215 10*3/uL (ref 150–400)
RBC: 3.8 MIL/uL — ABNORMAL LOW (ref 3.87–5.11)
RDW: 12.2 % (ref 11.5–15.5)
WBC: 8 10*3/uL (ref 4.0–10.5)
nRBC: 0 % (ref 0.0–0.2)

## 2024-05-19 LAB — LITHIUM LEVEL: Lithium Lvl: 0.65 mmol/L (ref 0.60–1.20)

## 2024-06-18 ENCOUNTER — Other Ambulatory Visit
Admission: RE | Admit: 2024-06-18 | Discharge: 2024-06-18 | Disposition: A | Discharge status: Home / Self Care | Attending: Psychiatry | Admitting: Psychiatry

## 2024-06-18 DIAGNOSIS — Z79899 Other long term (current) drug therapy: Secondary | ICD-10-CM | POA: Diagnosis not present

## 2024-06-18 DIAGNOSIS — F250 Schizoaffective disorder, bipolar type: Secondary | ICD-10-CM | POA: Diagnosis present

## 2024-06-18 LAB — CBC WITH DIFFERENTIAL/PLATELET
Abs Immature Granulocytes: 0.02 K/uL (ref 0.00–0.07)
Basophils Absolute: 0.1 K/uL (ref 0.0–0.1)
Basophils Relative: 1 %
Eosinophils Absolute: 0.3 K/uL (ref 0.0–0.5)
Eosinophils Relative: 4 %
HCT: 37.2 % (ref 36.0–46.0)
Hemoglobin: 11.8 g/dL — ABNORMAL LOW (ref 12.0–15.0)
Immature Granulocytes: 0 %
Lymphocytes Relative: 33 %
Lymphs Abs: 2.5 K/uL (ref 0.7–4.0)
MCH: 29.9 pg (ref 26.0–34.0)
MCHC: 31.7 g/dL (ref 30.0–36.0)
MCV: 94.4 fL (ref 80.0–100.0)
Monocytes Absolute: 0.4 K/uL (ref 0.1–1.0)
Monocytes Relative: 6 %
Neutro Abs: 4.3 K/uL (ref 1.7–7.7)
Neutrophils Relative %: 56 %
Platelets: 217 K/uL (ref 150–400)
RBC: 3.94 MIL/uL (ref 3.87–5.11)
RDW: 12.3 % (ref 11.5–15.5)
WBC: 7.7 K/uL (ref 4.0–10.5)
nRBC: 0 % (ref 0.0–0.2)

## 2024-06-18 LAB — LITHIUM LEVEL: Lithium Lvl: 0.68 mmol/L (ref 0.60–1.20)

## 2024-06-22 ENCOUNTER — Telehealth: Payer: Self-pay | Admitting: Psychiatry

## 2024-06-22 NOTE — Telephone Encounter (Signed)
 Pt called in to remind Dr. Geoffry her CBC with Differential order is coming up due in August.

## 2024-06-23 ENCOUNTER — Other Ambulatory Visit: Payer: Self-pay | Admitting: Psychiatry

## 2024-06-23 DIAGNOSIS — Z79899 Other long term (current) drug therapy: Secondary | ICD-10-CM

## 2024-06-23 NOTE — Telephone Encounter (Signed)
 Put in new order for standing order, printed it and faxed to [facility].

## 2024-06-24 ENCOUNTER — Other Ambulatory Visit: Payer: Self-pay

## 2024-06-24 DIAGNOSIS — Z79899 Other long term (current) drug therapy: Secondary | ICD-10-CM

## 2024-07-07 ENCOUNTER — Telehealth: Payer: Self-pay | Admitting: Psychiatry

## 2024-07-07 NOTE — Telephone Encounter (Signed)
 Shanitha called wanting to know if she should still be taking the prunes? Her phone number is [phone].

## 2024-07-07 NOTE — Telephone Encounter (Signed)
 Pt reporting diarrhea since Sunday. Told her she could stop the prunes and see if diarrhea resolves. If she has constipation again can resume prunes. She asks if she should stop the clozapine  and I told her no.

## 2024-07-13 ENCOUNTER — Ambulatory Visit (INDEPENDENT_AMBULATORY_CARE_PROVIDER_SITE_OTHER): Payer: 59 | Admitting: Psychiatry

## 2024-07-13 ENCOUNTER — Encounter: Payer: Self-pay | Admitting: Psychiatry

## 2024-07-13 DIAGNOSIS — Z79899 Other long term (current) drug therapy: Secondary | ICD-10-CM | POA: Diagnosis not present

## 2024-07-13 DIAGNOSIS — F4001 Agoraphobia with panic disorder: Secondary | ICD-10-CM | POA: Diagnosis not present

## 2024-07-13 DIAGNOSIS — F250 Schizoaffective disorder, bipolar type: Secondary | ICD-10-CM | POA: Diagnosis not present

## 2024-07-13 DIAGNOSIS — K5909 Other constipation: Secondary | ICD-10-CM

## 2024-07-13 MED ORDER — CLONAZEPAM 1 MG PO TABS: ORAL_TABLET | ORAL | 5 refills | Status: AC

## 2024-07-13 MED ORDER — CLOZAPINE 100 MG PO TABS: 400.0000 mg | ORAL_TABLET | Freq: Every day | ORAL | 6 refills | Status: AC

## 2024-07-13 MED ORDER — LITHIUM CARBONATE 300 MG PO TABS: ORAL_TABLET | ORAL | 1 refills | Status: AC

## 2024-07-13 NOTE — Progress Notes (Signed)
 Carla Thompson [id number] 02/09/1961 63 y.o.  Subjective:   Patient ID:  Carla Thompson is a 63 y.o. (DOB [birth year]) female.  Chief Complaint:  Chief Complaint  Patient presents with   Follow-up   Anxiety   Depression   Sleeping Problem   Medication Reaction    HPI Carla Thompson presents to the office today for follow-up of schizoaffective disorder.  visit October, 2020.  No med changes.  She continued on clozapine  400, lithium  600, and clonazepam  0.5 twice daily as needed.   03/09/20 appt noted: StepF had been sick with Covid and other problems.  Died.  Other family members with Covid problems too and died. Carla Thompson worked from home 10/13/2024 to April  Good overall without complaints.  Stress dealing with Covid.  Trying to stay active including cooking.  No major episodes of confusion nor voices.    No fear.  Does emails on the computer.  Not much anxiety lately.  Sleep same 9-9.  No fear episodes.  No manic nor depressive episodes.  No significant panic attacks recently.  She is not avoiding things out of anxiety generally.  She's still active at church.   Large infrequent BM clog toilet and intermittently not well managed.  Plan: Still problems with large infrequent q 4-7 days, large BM.  Often clogs the toilet.  Not well managed. Increase Linzess  trial to 290 mg daily to see if it's better managed.  09/07/20 appt with following noted: She has notes. Balance problems, movements in feet legs, stomach.  Marching movements in feet.  Scares her. 1 fall on stairs going up. Wonders about reducing clozapine  to 3 instead of 4 bc of tiredness. B researched lithium  and said lithium  and clozapine  are bad medicines with a lot of SE.   she's worried about SE. Disc ran out of clozapine  for 2 weeks and decompensated.  back on it for almost a month.  Pharmacy was saying they couldn't dispense clozapine  without labs and they didn't have the labs.  H says she's not back to normal yet bc can't  focus and make simple decisions.  For example couldn't decide whether to drink water out of the faucet or out of Brita water filter.  Lost 10# and not eating well.  Poor appetite.   Severely constipated chronically.  Using miralax .  Plan: continue clozapine   11/09/20 appt with following noted:  Seen with VEAR Carla Thompson CO problems with chronic constipation and using Miralax  twice daily.  It is helpful but varies.  Doesn't like clozapine  for this reason.  Usual trouble is leakage from the stool.  Disc sphincter exercises.   Lost weight. Some depression over the TD including feet moving up and down.  Someitmes stomach moves.  Occ tremor but not lately.  Trouble with sleep, initial and middle. Some of the problems with getting clozapine  may have been related to not getting th labs to the correct [facility] in [place].  Doesn't like prunes or prune juice..  Not attending church due to having to wear depends and tardive dyskinesia.   Plan: Can switch to clonazepam  0.5 mg in am and 1.0 mg HS  02/20/2021 appointment with the following noted: Walks outside.   Carla Thompson got Covid for 3 weeks.  She got milder sx with assumption she had it also. No problems getting clozapine  since here.  Have worked on getting it more consistently and that seems to be working.    Better than 6 mos ago.  Stronger physically and mentally.  No sig panic attacks now. Balance is better.  Weight loss stopped.  Not depressed.   Better with more consistency with clozapine . Exercising more and that's helped.  Plan: Continue clozapine  400 mg HS  (She prefers all at once at night) Contnue lithium  600 mg HS Panic disorder controlled. clonazepam  0.75 mg BID   07/26/2021 appointment with the following noted: seen with VEAR Carla Thompson Hard time swallowing pills.  Disc jury duty.  She doesn't feel capable.  Doesn't drive.   Anxious about taking pills but otherwise is usually doing ok.  Tryitng to walk 6 times weekly and physically feels better.    Patient reports stable mood and denies depressed or irritable moods.  Patient denies any recent difficulty with anxiety.  Patient denies difficulty with sleep initiation or maintenance. Denies appetite disturbance.  Patient reports that energy and motivation have been good.  Patient with chronic difficulty with concentration.  Patient denies any suicidal ideation. No problems getting clozapine  lately. Plan: No med changes  01/22/2022 appointment with the following noted: Continues clozapine  400 mg nightly, lithium  600 mg nightly, clonazepam  0.75 mg twice daily For the most part doing well.  Except fell and hurt tailbone.  Fell in the dark at night and was disoriented to space and tripped. It continues to hurt after a couple of weeks. Asks about reducing clozapine  to 3 nightly. No panic attacks since here. Plan: No med changes:  Continue clozapine  400 mg HS  (She prefers all at once at night) Lithium  600 mg HS clonazepam  0.75 mg BID  07/10/2022 appointment noted: seen with H per usual Has notes.  Clemens getting into car and asked about iburprofen.  Feb fall and broke tailbone.  Trying to be careful about falling.  Wonders if meds contribute to fall risk.  Asks why need 4 clozapine . Still moves feet at times when not needed. Sun AM sometimes hard to stay awake.  Never falls asleep. Not driven in 11 years. No panic. No paranoia and psychosis. Plan: no med changes.    01/06/23 appt noted:  seen with H per usual Has notes.  Keeping busy.  Trying to exercise but afraid of stairs bc afraid of falling.  Last year 4 falls.  Did some exercises including walking some stairs for a couple months.  I think it may have helped but she stopped it bc fear of falling.  Does some exercising.   Generally not dizzy upon standing except when first awakens. Has continued meds consistently.   Some mild tremor. Will move her feet at church and wonder if it is related to clozapine . No sig psychotic sx of delusions or  hallucinations.   Chronic frustration with clozapine  bc constipation and balance issues.  Sometimes interferes with activities. She doesn't remember what her sx were like when poorly controlled.  07/14/23 appt noted:  seen with H Meds: clonazepam  1 mg 1/2 tab TID, clozapine  400 mg HS, lithium  600 mg HS.   She still wants to reduce bc having problems with constipation and then diarrhea.   Trulance  helped GI px.  New med.   Also prn lactulose .   Still large stools.  Eats a lot of fruits and vegetables.   Also does exercise to help.   Drinks only water.  Doctor managing constipation.   Taking calcium carbonate and magnesium . Was told to take iron. Mood and anxiety are stable.  No sig delusions or hallucinations.   No sig fearfulness.  No panic.   Sleep is the same and  ok.   Still gets concerns she has TD bc episodes of moving feet in ways that seem out of control.  Does not happen when she lays down.   Asks opinions on several medical problems. She feels like she is hard of hearing.   2 gkids Veria DUBIN , Richardean 62 yo.   Plan: Check lithium  level & labs.  09/04/23 TC :   Patient with c/o burning sensation in her mouth, dry mouth, nausea, and occasional stomach spasms. She has difficulty trying to verbalize the stomach spasms. They don't occur every day, but is not able to provide more information.  She said the clozapine  causes either diarrhea or constipation and your note mentions she would like to reduce it. She is on calcium, magnesium , vitamin C, and zinc supplements.     MD resp:   These are not new problems though they may get better and worse.  She has a GI doctor and they are managing her GI px.  Clozapine  does tend to cause constipation but GI is managing that. Current meds should not be causing dry mouth or nausea.  She does need to drink lots of water with the meds. She and I and her H disc her med concerns at the last appt.  She has relapsed on lower doses of clozapine  and it took a long  time for her to get better.  It would be a mistake to try to reduce that again.     01/14/24 appt noted:  with Carla Thompson Meds: clonazepam  1 mg 1/2 tab TID, clozapine  400 mg HS, lithium  600 mg HS.   Anxiety been good.  Generally.  Has tech anxiety. Still active at church.   Steve's aunt 9 yo died.   Sleep and dep are ok.   No problems with meds overall.  Lactulose  prn helps with constipation.  07/13/24 appt noted:  with Carla Thompson and she has notes.  Med: clonazepam  1 mg 1/2 tab TID, clozapine  400 mg HS, lithium  600 mg HS.   Good trip up [place] for vacation.   Good excuse fro [employer]  but need it permanently.  Gets tired from standing for periods like cooking. No longer falling asleep at church. Bothered and embarrassed by leg fidgetiness and will move her legs up and down.  It usually happens when standing up rather than sitting.  No sig discomfort.  It is a nuisance.  She thinks it goes away when seated.  Worse in the morning.  Not obviously a restless feeling. Tremor is gone from the hands.. Can get cl[place]apine  in [place] now.  Severe diarrhea and leakage for 5-6 days daily.  Most of the time pretty steady with usual BM 3 times weekly.  PCP labs for PE all normal.    Oldest daughter married March 2020  On clozapine  for many years with benefit.  Several psych hospitalizations.  She is failed multiple other psychiatric medications which is why she is on clozapine .  She also failed or relapsed with dosage reduction of clozapine  or if she missed clozapine .  She is consistent with her dosage now.  Past Psychiatric Medication Trials: prior antipsychotic trials Clozapine  400 almost 25 year.s Clonazepam  Lithium  600 Citrucil, Miralax , stool softener, Mg tablets  Sister has schizophrenia.  Review of Systems:  Review of Systems  HENT:         Dry mouth  Cardiovascular:  Negative for palpitations.  Gastrointestinal:  Positive for constipation and diarrhea. Negative for abdominal distention  and abdominal pain.  Genitourinary:  Positive for enuresis.  Musculoskeletal:  Positive for back pain, neck pain and neck stiffness.  Neurological:        Chronic balance issues with stooped posture.  Psychiatric/Behavioral:  Positive for decreased concentration and sleep disturbance. Negative for agitation, behavioral problems, confusion, dysphoric mood, hallucinations, self-injury and suicidal ideas. The patient is not nervous/anxious and is not hyperactive.   Constipation comes and goes  Medications: I have reviewed the patient's current medications.  Current Outpatient Medications  Medication Sig Dispense Refill   Ascorbic Acid (VITAMIN C) 500 MG CHEW Chew by mouth.     Calcium Carbonate-Vit D-Min (CALCIUM 1200 PO) Take by mouth.     clonazePAM  (KLONOPIN ) 1 MG tablet TAKE 1/2 TABLET BY MOUTH EVERY MORNING, AT NOON AND AT BEDTIME 45 tablet 5   cloZAPine  (CLOZARIL ) 100 MG tablet TAKE 4 TABLETS AT BEDTIME 120 tablet 6   famotidine  (PEPCID ) 40 MG tablet TAKE 1 TABLET(40 MG) BY MOUTH DAILY 90 tablet 1   lactulose  (CHRONULAC ) 10 GM/15ML solution Take 45 mLs (30 g total) by mouth 2 (two) times daily as needed for mild constipation. 946 mL 11   lithium  300 MG tablet TAKE 2 TABLETS(600 MG) BY MOUTH AT BEDTIME 180 tablet 1   Magnesium  400 MG CAPS Take by mouth.     metoprolol  tartrate (LOPRESSOR ) 25 MG tablet Take 0.5 tablets (12.5 mg total) by mouth 2 (two) times daily. 90 tablet 1   Plecanatide  (TRULANCE ) 3 MG TABS Take 1 tablet (3 mg total) by mouth daily. 90 tablet 2   zinc gluconate 50 MG tablet Take 50 mg by mouth daily.     No current facility-administered medications for this visit.    Medication Side Effects: Other: alternating contstipation and diarrhea but has IBS  Allergies:  Allergies  Allergen Reactions   Levsin [Hyoscyamine Sulfate] Hives   Escitalopram Hives    Past Medical History:  Diagnosis Date   Abnormal perimenopausal bleeding    Bowel incontinence    Chronic  constipation    Dermatitis    Dysplastic nevus 08/26/2022   L pinna ear, Moderate to Severe atypia, Excised 09/24/2022   High risk medication use    Hyperglycemia    Hypertriglyceridemia    Iron deficiency anemia due to chronic blood loss    secondary to heavy flow   Metallic taste    Schizo-affective psychosis (HCC)    Schizoaffective disorder, bipolar type (HCC)    Dr. Charlet    Family History  Problem Relation Age of Onset   Mental illness Sister        Schizophrenia and bipolar   Cancer Maternal Grandmother        Colon   Mental illness Paternal Grandmother        Schizophrenia   Breast cancer Neg Hx     Social History   Socioeconomic History   Marital status: Married    Spouse name: Elspeth   Number of children: 2   Years of education: Not on file   Highest education level: Bachelor's degree (e.g., BA, AB, BS)  Occupational History   Not on file  Tobacco Use   Smoking status: Never   Smokeless tobacco: Never  Vaping Use   Vaping status: Never Used  Substance and Sexual Activity   Alcohol use: No    Alcohol/week: 0.0 standard drinks of alcohol   Drug use: No   Sexual activity: Not Currently    Partners: Male  Other Topics Concern   Not on  file  Social History Narrative   Not on file   Social Drivers of Health   Financial Resource Strain: Low Risk  (02/20/2022)   Overall Financial Resource Strain (CARDIA)    Difficulty of Paying Living Expenses: Not hard at all  Food Insecurity: No Food Insecurity (02/20/2022)   Hunger Vital Sign    Worried About Running Out of Food in the Last Year: Never true    Ran Out of Food in the Last Year: Never true  Transportation Needs: No Transportation Needs (02/20/2022)   PRAPARE - Administrator, Civil Service (Medical): No    Lack of Transportation (Non-Medical): No  Physical Activity: Sufficiently Active (02/20/2022)   Exercise Vital Sign    Days of Exercise per Week: 6 days    Minutes of  Exercise per Session: 60 min  Stress: Stress Concern Present (02/20/2022)   [employer] of Occupational Health - Occupational Stress Questionnaire    Feeling of Stress : To some extent  Social Connections: Socially Integrated (02/20/2022)   Social Connection and Isolation Panel    Frequency of Communication with Friends and Family: Three times a week    Frequency of Social Gatherings with Friends and Family: Three times a week    Attends Religious Services: More than 4 times per year    Active Member of Clubs or Organizations: Yes    Attends Banker Meetings: More than 4 times per year    Marital Status: Married  Catering manager Violence: Not At Risk (02/20/2022)   Humiliation, Afraid, Rape, and Kick questionnaire    Fear of Current or Ex-Partner: No    Emotionally Abused: No    Physically Abused: No    Sexually Abused: No    Past Medical History, Surgical history, Social history, and Family history were reviewed and updated as appropriate.   Please see review of systems for further details on the patient's review from today.   Objective:   Physical Exam:  LMP 12/16/2016 (Approximate)   Physical Exam Constitutional:      General: She is not in acute distress.    Appearance: She is well-developed.  Musculoskeletal:        General: No deformity.  Neurological:     Mental Status: She is alert and oriented to person, place, and time.     Cranial Nerves: No dysarthria.     Coordination: Coordination normal.  Psychiatric:        Attention and Perception: Perception normal. She is inattentive. She does not perceive auditory or visual hallucinations.        Mood and Affect: Mood is anxious. Mood is not depressed. Affect is not labile, blunt, angry, tearful or inappropriate.        Speech: Speech is not rapid and pressured or slurred.        Behavior: Behavior normal. Behavior is cooperative.        Thought Content: Thought content is not paranoid or delusional.  Thought content does not include homicidal or suicidal ideation. Thought content does not include suicidal plan.        Cognition and Memory: Cognition is impaired. She exhibits impaired recent memory.     Comments: Insight fair to poor & judgment fair. Scattered thought chronically, talkative and  overinclusiveness but not as severe as in the past.  Chronically reduced volume.  Difficult to finish session on time.  Not as fidgety as in the past.  Affect more relaxed. Not irritable No psychosis elicited. Chronic  stooped posture.  Has addressed with PCP       Lab Review:     Component Value Date/Time   NA 142 05/10/2024 1603   NA 140 01/18/2016 1418   NA 139 08/27/2013 0930   K 4.2 05/10/2024 1603   K 4.2 08/27/2013 0930   CL 105 05/10/2024 1603   CL 108 (H) 08/27/2013 0930   CO2 29 05/10/2024 1603   CO2 28 08/27/2013 0930   GLUCOSE 111 (H) 05/10/2024 1603   GLUCOSE 98 08/27/2013 0930   BUN 14 05/10/2024 1603   BUN 17 01/18/2016 1418   BUN 11 08/27/2013 0930   CREATININE 0.96 05/10/2024 1603   CALCIUM 9.3 05/10/2024 1603   CALCIUM 8.5 08/27/2013 0930   PROT 6.1 05/10/2024 1603   PROT 6.6 01/18/2016 1418   PROT 7.2 08/27/2013 0930   ALBUMIN 4.3 09/11/2020 2000   ALBUMIN 4.4 01/18/2016 1418   ALBUMIN 3.5 08/27/2013 0930   AST 13 05/10/2024 1603   AST 20 08/27/2013 0930   ALT 14 05/10/2024 1603   ALT 19 08/27/2013 0930   ALKPHOS 95 09/11/2020 2000   ALKPHOS 107 08/27/2013 0930   BILITOT 0.3 05/10/2024 1603   BILITOT <0.2 01/18/2016 1418   BILITOT 0.3 08/27/2013 0930   GFRNONAA >60 09/11/2020 2000   GFRNONAA 63 07/11/2020 1558   GFRAA >60 08/31/2020 1633   GFRAA 73 07/11/2020 1558       Component Value Date/Time   WBC 7.7 06/18/2024 1302   RBC 3.94 06/18/2024 1302   HGB 11.8 (L) 06/18/2024 1302   HGB 12.3 01/23/2016 1231   HCT 37.2 06/18/2024 1302   HCT 38.6 01/23/2016 1231   PLT 217 06/18/2024 1302   PLT 280 01/23/2016 1231   MCV 94.4 06/18/2024 1302    MCV 95 01/23/2016 1231   MCV 92 03/24/2015 1244   MCH 29.9 06/18/2024 1302   MCHC 31.7 06/18/2024 1302   RDW 12.3 06/18/2024 1302   RDW 13.7 01/23/2016 1231   RDW 13.1 03/24/2015 1244   LYMPHSABS 2.5 06/18/2024 1302   LYMPHSABS 2.4 01/23/2016 1231   LYMPHSABS 2.2 03/24/2015 1244   MONOABS 0.4 06/18/2024 1302   MONOABS 0.4 03/24/2015 1244   EOSABS 0.3 06/18/2024 1302   EOSABS 0.1 01/23/2016 1231   EOSABS 0.4 03/24/2015 1244   BASOSABS 0.1 06/18/2024 1302   BASOSABS 0.0 01/23/2016 1231   BASOSABS 0.0 03/24/2015 1244    Lithium  Lvl  Date Value Ref Range Status  06/18/2024 0.68 0.60 - 1.20 mmol/L Final    Comment:    Performed at [facility], [address] [place], [place] [address]   06/18/24 lihtium 0.68 on 600 HS stable.   Lab Results  Component Value Date   VALPROATE 102 (H) 12/26/2015     .res Assessment: Plan:    Schizoaffective disorder, bipolar type (HCC)  Panic disorder with agoraphobia  Lithium  use  Chronic constipation  Long term current use of clozapine    45 min appt:  Overall her schizoaffective disorder was under control.  Until she ran out of clozapine  bc of difficulty getting it last summer/fall 2021.  This led to more disorganziation, confusion, anxiety and rumination.  Insight is not good.  Not self aware about overinclusiveness. All of this is better now with better clozapine  consistency and exercise.  Discussed the importance of consistent with the clozapine .  Disc severity of sx she had when inadequate dose of clozapine .  If they have difficulty obtaining it in the future please  contact us  right away.  She missed an off of the clozapine  to cause disorganization and psychotic symptoms as well as anxiety.  In past when resumed took many weeks for psychosis to resolve.  Answered questions about clozapine  and alternatives.  This has been much more effective and will not cause TD.  No comparable alternative. Continue clozapine  400 mg HS  (She  prefers all at once at night)  Disc FDA change in guidelines. She will stop clozapine  blood draw to once every 6 mos.   Panic disorder controlled. Consider reduction bc balance but history poor tolerance and coping with panic.  continue clonazepam  0.5 mg TID  Contnue lithium  600 mg HS.  12/2023 =0.68 and July 2025, stable for several mos.  Counseled patient regarding potential benefits, risks, and side effects of lithium  to include potential risk of lithium  affecting thyroid  and renal function.  Discussed need for periodic lab monitoring to determine drug level and to assess for potential adverse effects.  Counseled patient regarding signs and symptoms of lithium  toxicity and advised that they notify office immediately or seek urgent medical attention if experiencing these signs and symptoms.  Patient advised to contact office with any questions or concerns. Disc DDI with NSAIDs.  Need lithium  level  regularly. It's been stable. Tylenol  for pain.   Fall precautions around sedative medications. Previously Disc PT for head forward posture which she did some.  This as means to help reduce fall risk.    ANC stable . Continue clozapine .    Vitamin D  in winter.  GI managing constipation better.  Exercise helps constipation and new meds.   She is walking 6 days per week.  Disc getting help for hearing impairment and getting assessed.    Leg movments of unusual combination  No med changes:  Continue clozapine  400 mg HS  (She prefers all at once at night).  Risky to reduce bc took months to get better when she was psychotic before.  Rec not reduce. Will change to q 6 mos CBC bc stable for 25 year. Disc again risk low WBC and infection risk but they are satisfied with the plan Lithium  600 mg HS clonazepam  0.5 mg TID  She wants to consider reduction in clozapine  DT constipation.  However,  She also failed or relapsed with dosage reduction of clozapine  or if she missed clozapine  in the past.   Strongly rec against reducing the dose.    This appt was 30 mins.  FU 6 mos  Lorene Macintosh, MD, DFAPA   Please see After Visit Summary for patient specific instructions.  Future Appointments  Date Time Provider Department Center  11/09/2024  3:40 PM Sowles, Krichna, MD [facility]  01/12/2025  4:00 PM Hester Alm BROCKS, MD [facility] None    No orders of the defined types were placed in this encounter.      -------------------------------

## 2024-09-08 ENCOUNTER — Other Ambulatory Visit: Payer: Self-pay | Admitting: Family Medicine

## 2024-09-08 DIAGNOSIS — K219 Gastro-esophageal reflux disease without esophagitis: Secondary | ICD-10-CM

## 2024-09-20 ENCOUNTER — Other Ambulatory Visit: Payer: Self-pay | Admitting: Family Medicine

## 2024-09-20 DIAGNOSIS — Z1231 Encounter for screening mammogram for malignant neoplasm of breast: Secondary | ICD-10-CM

## 2024-09-27 ENCOUNTER — Other Ambulatory Visit: Payer: Self-pay | Admitting: Family Medicine

## 2024-09-27 NOTE — Telephone Encounter (Unsigned)
 Copied from CRM [id number]. Topic: Clinical - Medication Refill >> Sep 27, 2024  9:51 AM Wess RAMAN wrote: Medication: Plecanatide  (TRULANCE ) 3 MG TABS   Has the patient contacted their pharmacy? Yes (Agent: If no, request that the patient contact the pharmacy for the refill. If patient does not wish to contact the pharmacy document the reason why and proceed with request.) (Agent: If yes, when and what did the pharmacy advise?) Call doctor  This is the patient's preferred pharmacy:  [id number] [place], [place] - [address] AT [facility] OF [address] [place] [address] Phone: [phone] Fax: [phone]  Is this the correct pharmacy for this prescription? Yes If no, delete pharmacy and type the correct one.   Has the prescription been filled recently? No  Is the patient out of the medication? No  Has the patient been seen for an appointment in the last year OR does the patient have an upcoming appointment? Yes  Can we respond through MyChart? No  Agent: Please be advised that Rx refills may take up to 3 business days. We ask that you follow-up with your pharmacy.

## 2024-09-28 NOTE — Telephone Encounter (Signed)
 Requested medication (s) are due for refill today: Yes  Requested medication (s) are on the active medication list: Yes  Last refill:  09/02/23  Future visit scheduled: Yes  Notes to clinic:  Unable to refill due to no refill protocol for this medication.       Requested Prescriptions  Pending Prescriptions Disp Refills   Plecanatide  (TRULANCE ) 3 MG TABS 90 tablet 2    Sig: Take 1 tablet (3 mg total) by mouth daily.     Off-Protocol Failed - 09/28/2024  3:20 PM      Failed - Medication not assigned to a protocol, review manually.      Passed - Valid encounter within last 12 months    Recent Outpatient Visits           4 months ago Schizoaffective disorder, bipolar type [facility])   [place] [facility] Sowles, Krichna, MD       Future Appointments             In 3 months Hester Alm BROCKS, MD [facility]

## 2024-09-29 MED ORDER — TRULANCE 3 MG PO TABS
1.0000 | ORAL_TABLET | Freq: Every day | ORAL | 2 refills | Status: AC
Start: 2024-09-29 — End: ?

## 2024-09-29 NOTE — Telephone Encounter (Signed)
Don't see on med list 

## 2024-10-21 ENCOUNTER — Ambulatory Visit
Admission: RE | Admit: 2024-10-21 | Discharge: 2024-10-21 | Disposition: A | Discharge status: Home / Self Care | Source: Ambulatory Visit | Attending: Family Medicine | Admitting: Family Medicine

## 2024-10-21 DIAGNOSIS — Z1231 Encounter for screening mammogram for malignant neoplasm of breast: Secondary | ICD-10-CM | POA: Diagnosis present

## 2024-11-09 ENCOUNTER — Ambulatory Visit: Admitting: Family Medicine

## 2024-11-09 ENCOUNTER — Encounter: Payer: Self-pay | Admitting: Family Medicine

## 2024-11-09 VITALS — BP 116/74 | HR 83 | Resp 16 | Ht 64.0 in | Wt 138.6 lb

## 2024-11-09 DIAGNOSIS — R000 Tachycardia, unspecified: Secondary | ICD-10-CM

## 2024-11-09 DIAGNOSIS — K219 Gastro-esophageal reflux disease without esophagitis: Secondary | ICD-10-CM

## 2024-11-09 DIAGNOSIS — R29898 Other symptoms and signs involving the musculoskeletal system: Secondary | ICD-10-CM

## 2024-11-09 DIAGNOSIS — E785 Hyperlipidemia, unspecified: Secondary | ICD-10-CM

## 2024-11-09 DIAGNOSIS — M8589 Other specified disorders of bone density and structure, multiple sites: Secondary | ICD-10-CM

## 2024-11-09 DIAGNOSIS — M545 Low back pain, unspecified: Secondary | ICD-10-CM

## 2024-11-09 DIAGNOSIS — G8929 Other chronic pain: Secondary | ICD-10-CM

## 2024-11-09 DIAGNOSIS — K581 Irritable bowel syndrome with constipation: Secondary | ICD-10-CM

## 2024-11-09 DIAGNOSIS — R2681 Unsteadiness on feet: Secondary | ICD-10-CM

## 2024-11-09 DIAGNOSIS — F250 Schizoaffective disorder, bipolar type: Secondary | ICD-10-CM

## 2024-11-09 DIAGNOSIS — M5382 Other specified dorsopathies, cervical region: Secondary | ICD-10-CM

## 2024-11-09 MED ORDER — METOPROLOL TARTRATE 25 MG PO TABS: 12.5000 mg | ORAL_TABLET | Freq: Two times a day (BID) | ORAL | 1 refills | Status: AC

## 2024-11-09 MED ORDER — FAMOTIDINE 40 MG PO TABS: 40.0000 mg | ORAL_TABLET | Freq: Every day | ORAL | 1 refills | Status: AC

## 2024-11-09 NOTE — Progress Notes (Signed)
 Name: Carla Thompson   MRN: [id number]    DOB: 21-Dec-1960   Date:11/09/2024       Progress Note  Subjective  Chief Complaint  Chief Complaint  Patient presents with   Medical Management of Chronic Issues   Discussed the use of AI scribe software for clinical note transcription with the patient, who gave verbal consent to proceed.  History of Present Illness Carla Thompson is a 63 year old female who presents for a six-month follow-up.  She experiences gait instability, leading to several near falls and one actual fall over the past year. On November 6th, she experienced foot instability while trying to go to the bathroom, and on September 20th, she nearly tripped over a tent in her living room. She recalls a fall last year while trying to get into a fleeta during a family outing. She attributes some of these incidents to possibly moving too quickly. She has been performing exercises taught by a physical therapist but continues to experience episodes of instability.  She has a history of IBS with constipation and is currently taking Trulance  and lactulose  as needed for constipation, which occurs about once a week. She follows a bland diet during episodes of diarrhea, which she experienced for six consecutive days in January. She is seeking a referral to follow up with her gastroenterologist for ongoing management.  She has a history of osteopenia and underwent a bone density test in 2023. She is taking otc vitamin D    She has schizoaffective disorder, bipolar type, and is on a stable regimen of clozapine , lithium , and clonazepam . She no longer needs monthly blood draws for clozapine  monitoring, which has been reduced to every six months or quarterly.  She takes metoprolol  for tachycardia, with a dosage of half a tablet twice daily, and reports no noticeable issues with her heart rate.   She also takes famotidine  for acid reflux, although she denies current symptoms of heartburn or  indigestion.  She has been experiencing a cold for the past three to four weeks, characterized by a runny nose. No fever or shortness of breath. She manages symptoms with over-the-counter medications.    Patient Active Problem List   Diagnosis Date Noted   Chronic bilateral low back pain without sciatica 11/11/2023   Osteopenia of multiple sites 11/11/2023   Neck muscle weakness 11/11/2023   Muscular deconditioning 11/11/2023   Vitamin D  deficiency 05/15/2022   Dyslipidemia 11/20/2017   GERD without esophagitis 02/21/2017   Tachycardia 02/20/2016   RLS (restless legs syndrome) 12/21/2015   Irritable bowel syndrome with constipation 12/21/2015   Other long term (current) drug therapy 12/08/2015   Chronic constipation 12/08/2015   Insomnia, persistent 12/08/2015   Dermatitis, eczematoid 12/08/2015   Gravida 2 para 2 12/08/2015   Hypertriglyceridemia 12/08/2015   Metallic taste 12/08/2015   Female climacteric state 12/08/2015   Schizoaffective disorder, bipolar type (HCC) 12/08/2015    Past Surgical History:  Procedure Laterality Date   ANUS SURGERY  2000   [place]   COLONOSCOPY     COLONOSCOPY WITH PROPOFOL  N/A 11/30/2020   Procedure: COLONOSCOPY WITH PROPOFOL ;  Surgeon: Therisa Bi, MD;  Location: [facility] ENDOSCOPY;  Service: Gastroenterology;  Laterality: N/A;   TUBAL LIGATION  12/02/1994    Family History  Problem Relation Age of Onset   Mental illness Sister        Schizophrenia and bipolar   Cancer Maternal Grandmother        Colon   Mental illness Paternal Grandmother  Schizophrenia   Breast cancer Neg Hx     Social History   Tobacco Use   Smoking status: Never   Smokeless tobacco: Never  Substance Use Topics   Alcohol use: No    Alcohol/week: 0.0 standard drinks of alcohol     Current Outpatient Medications:    Ascorbic Acid (VITAMIN C) 500 MG CHEW, Chew by mouth., Disp: , Rfl:    Calcium Carbonate-Vit D-Min (CALCIUM 1200 PO), Take by mouth.,  Disp: , Rfl:    clonazePAM  (KLONOPIN ) 1 MG tablet, TAKE 1/2 TABLET BY MOUTH EVERY MORNING, AT NOON AND AT BEDTIME, Disp: 45 tablet, Rfl: 5   cloZAPine  (CLOZARIL ) 100 MG tablet, Take 4 tablets (400 mg total) by mouth at bedtime., Disp: 120 tablet, Rfl: 6   lactulose  (CHRONULAC ) 10 GM/15ML solution, Take 45 mLs (30 g total) by mouth 2 (two) times daily as needed for mild constipation., Disp: 946 mL, Rfl: 11   lithium  300 MG tablet, TAKE 2 TABLETS(600 MG) BY MOUTH AT BEDTIME, Disp: 180 tablet, Rfl: 1   Magnesium  400 MG CAPS, Take by mouth., Disp: , Rfl:    Multiple Vitamins-Minerals (MULTIVITAMIN GUMMIES ADULT) CHEW, Chew 1 tablet by mouth daily., Disp: , Rfl:    Plecanatide  (TRULANCE ) 3 MG TABS, Take 1 tablet (3 mg total) by mouth daily., Disp: 90 tablet, Rfl: 2   zinc gluconate 50 MG tablet, Take 50 mg by mouth daily., Disp: , Rfl:    famotidine  (PEPCID ) 40 MG tablet, Take 1 tablet (40 mg total) by mouth daily., Disp: 90 tablet, Rfl: 1   metoprolol  tartrate (LOPRESSOR ) 25 MG tablet, Take 0.5 tablets (12.5 mg total) by mouth 2 (two) times daily., Disp: 90 tablet, Rfl: 1  Allergies  Allergen Reactions   Levsin [Hyoscyamine Sulfate] Hives   Escitalopram Hives    I personally reviewed active problem list, medication list, allergies, family history with the patient/caregiver today.   ROS  Ten systems reviewed and is negative except as mentioned in HPI    Objective Physical Exam CONSTITUTIONAL: Patient appears well-developed and well-nourished. No distress. HEENT: Head atraumatic, normocephalic, neck muscles are weak and she keeps her head down instead of up straight  CARDIOVASCULAR: Normal rate, regular rhythm and normal heart sounds. No murmur heard. No BLE edema. PULMONARY: Effort normal and breath sounds normal. Lungs clear to auscultation bilaterally. No respiratory distress. ABDOMINAL: There is no tenderness or distention. MUSCULOSKELETAL: slow gait PSYCHIATRIC: Patient has a normal  mood and affect. Behavior is normal. Judgment and thought content normal.  Vitals:   11/09/24 1541  BP: 116/74  Pulse: 83  Resp: 16  SpO2: 98%  Weight: 138 lb 9.6 oz (62.9 kg)  Height: 5' 4 (1.626 m)    Body mass index is 23.79 kg/m.    PHQ2/9:    11/09/2024    3:40 PM 05/10/2024    3:35 PM 12/15/2023    1:51 PM 11/11/2023    3:11 PM 05/12/2023    2:57 PM  Depression screen PHQ 2/9  Decreased Interest 0 0 0 0 0  Down, Depressed, Hopeless 0 0 0 0 0  PHQ - 2 Score 0 0 0 0 0  Altered sleeping   0 0 0  Tired, decreased energy   0 0 0  Change in appetite   0 0 0  Feeling bad or failure about yourself    0 0 0  Trouble concentrating   0 0 0  Moving slowly or fidgety/restless   0 0 0  Suicidal thoughts   0 0 0  PHQ-9 Score   0  0  0   Difficult doing work/chores   Not difficult at all Not difficult at all Not difficult at all     Data saved with a previous flowsheet row definition    phq 9 is negative  Fall Risk:    11/09/2024    3:40 PM 05/10/2024    3:32 PM 12/15/2023    1:51 PM 11/11/2023    3:07 PM 10/08/2023    9:39 AM  Fall Risk   Falls in the past year? 0 0 0 0 1  Number falls in past yr: 0 0 0 0 0  Injury with Fall? 0 0  0  0  1   Risk for fall due to : No Fall Risks No Fall Risks No Fall Risks No Fall Risks History of fall(s)  Follow up Falls evaluation completed Falls prevention discussed;Education provided;Falls evaluation completed Falls prevention discussed;Education provided;Falls evaluation completed Falls prevention discussed;Education provided;Falls evaluation completed Education provided;Falls prevention discussed;Falls evaluation completed     Data saved with a previous flowsheet row definition    Assessment & Plan Gait instability with recurrent falls Recurrent falls due to gait instability despite physical therapy exercises. - Continue exercises learned in physical therapy. - Advised to go back to PT but she would like to hold off for now  -  Advised wearing closed-toe shoes and avoiding sandals. - Advised ensuring clear walking paths and using handrails on stairs.  Irritable bowel syndrome with constipation IBS with constipation managed with Trulance  and lactulose . Occasional diarrhea managed with diet. - Continue Trulance  and lactulose  as needed. - Referred to GI specialist for further management.  Schizoaffective disorder, bipolar type Well-managed with clozapine , lithium , and clonazepam . Clozapine  monitoring every six months, thyroid  function annually due to lithium . - Continue clozapine , lithium , and clonazepam  as prescribed. - Continue annual thyroid  function monitoring.  Osteopenia post menopausal  Decreased bone mass. Last bone density scan in 2023. - Ordered bone density scan. - Advised to schedule bone density scan with mammogram next year.  Dyslipidemia Improved lipid panel with current management. - Continue current management and monitoring.  Tachycardia Heart rate well-controlled with metoprolol . - Continue metoprolol  as prescribed.  Gastroesophageal reflux disease GERD managed with famotidine , no current symptoms. - Continue famotidine  as prescribed.  Neck muscle weakness Improved with physical therapy exercises. - Continue exercises learned in physical therapy.  General Health Maintenance Not receiving flu shots. - Advised on flu shot and general health precautions.

## 2024-12-01 ENCOUNTER — Ambulatory Visit
Admission: RE | Admit: 2024-12-01 | Discharge: 2024-12-01 | Disposition: A | Discharge status: Home / Self Care | Source: Ambulatory Visit | Attending: Family Medicine | Admitting: Family Medicine

## 2024-12-01 DIAGNOSIS — M8589 Other specified disorders of bone density and structure, multiple sites: Secondary | ICD-10-CM | POA: Insufficient documentation

## 2024-12-03 ENCOUNTER — Ambulatory Visit: Payer: Self-pay | Admitting: Family Medicine

## 2024-12-13 ENCOUNTER — Telehealth: Payer: Self-pay | Admitting: Psychiatry

## 2024-12-13 NOTE — Telephone Encounter (Signed)
 Called husband, per DPR, since I couldn't reach patient. He reports patient gets very confused about what is needed and she did not need a RF on clozapine , wants an order for CBC and lithium  level.   There is an active order for CBC, but not for lithium .   Last lithium  level 06/18/24 was 0.68. Husband asking for lab order for lithium , unless you feel it is not needed.  Has a standing order for CBC that is still active.

## 2024-12-13 NOTE — Telephone Encounter (Signed)
 Pt lvm stating she needs rf on Clozapine  and wanted to know if Dr. Geoffry wanted to do a lithium  check.

## 2024-12-13 NOTE — Telephone Encounter (Signed)
 It looks like patient had clozapine  RF available. I called to verify and was told she had 3 RF available. LVM to [facility] to ask about lithium .

## 2024-12-14 ENCOUNTER — Other Ambulatory Visit: Payer: Self-pay | Admitting: Psychiatry

## 2024-12-14 DIAGNOSIS — F250 Schizoaffective disorder, bipolar type: Secondary | ICD-10-CM

## 2024-12-14 DIAGNOSIS — Z79899 Other long term (current) drug therapy: Secondary | ICD-10-CM

## 2024-12-14 NOTE — Telephone Encounter (Signed)
 Sent lithium  level order to [facility] lab.  Let her know or her H know

## 2024-12-15 NOTE — Telephone Encounter (Signed)
 Spoke with pt's husband, let him know order for lithium  level sent to lab

## 2025-01-07 ENCOUNTER — Other Ambulatory Visit: Admission: RE | Admit: 2025-01-07 | Source: Ambulatory Visit | Admitting: Psychiatry

## 2025-01-07 LAB — LITHIUM LEVEL: Lithium Lvl: 0.61 mmol/L (ref 0.60–1.20)

## 2025-01-12 ENCOUNTER — Ambulatory Visit: Payer: 59 | Admitting: Dermatology

## 2025-01-17 ENCOUNTER — Ambulatory Visit: Admitting: Psychiatry

## 2025-05-10 ENCOUNTER — Ambulatory Visit: Admitting: Family Medicine
